# Patient Record
Sex: Female | Born: 1945 | Race: White | Hispanic: No | Marital: Married | Smoking: Former smoker
Health system: Southern US, Community
[De-identification: ages and names within clinical notes are randomized; demographics above are authoritative.]

## PROBLEM LIST (undated history)

## (undated) DIAGNOSIS — R413 Other amnesia: Secondary | ICD-10-CM

## (undated) DIAGNOSIS — T7840XA Allergy, unspecified, initial encounter: Secondary | ICD-10-CM

## (undated) DIAGNOSIS — E7800 Pure hypercholesterolemia, unspecified: Secondary | ICD-10-CM

## (undated) DIAGNOSIS — I10 Essential (primary) hypertension: Secondary | ICD-10-CM

## (undated) DIAGNOSIS — K319 Disease of stomach and duodenum, unspecified: Secondary | ICD-10-CM

## (undated) DIAGNOSIS — C801 Malignant (primary) neoplasm, unspecified: Secondary | ICD-10-CM

## (undated) DIAGNOSIS — M1990 Unspecified osteoarthritis, unspecified site: Secondary | ICD-10-CM

## (undated) DIAGNOSIS — I200 Unstable angina: Secondary | ICD-10-CM

## (undated) DIAGNOSIS — R011 Cardiac murmur, unspecified: Secondary | ICD-10-CM

## (undated) DIAGNOSIS — I2510 Atherosclerotic heart disease of native coronary artery without angina pectoris: Secondary | ICD-10-CM

## (undated) DIAGNOSIS — M8580 Other specified disorders of bone density and structure, unspecified site: Secondary | ICD-10-CM

## (undated) HISTORY — DX: Allergy, unspecified, initial encounter: T78.40XA

## (undated) HISTORY — DX: Unspecified osteoarthritis, unspecified site: M19.90

## (undated) HISTORY — PX: EYE SURGERY: SHX253

## (undated) HISTORY — PX: HAND SURGERY: SHX662

## (undated) HISTORY — DX: Atherosclerotic heart disease of native coronary artery without angina pectoris: I25.10

## (undated) HISTORY — DX: Disease of stomach and duodenum, unspecified: K31.9

## (undated) HISTORY — DX: Other amnesia: R41.3

## (undated) HISTORY — DX: Malignant (primary) neoplasm, unspecified: C80.1

## (undated) HISTORY — PX: OTHER SURGICAL HISTORY: SHX169

## (undated) HISTORY — PX: ABDOMINAL HYSTERECTOMY: SHX81

## (undated) HISTORY — PX: CORONARY ARTERY BYPASS GRAFT: SHX141

## (undated) HISTORY — DX: Other specified disorders of bone density and structure, unspecified site: M85.80

## (undated) HISTORY — DX: Cardiac murmur, unspecified: R01.1

## (undated) HISTORY — PX: CHOLECYSTECTOMY: SHX55

---

## 1998-07-05 ENCOUNTER — Encounter: Payer: Self-pay | Admitting: Cardiology

## 1998-07-05 ENCOUNTER — Inpatient Hospital Stay (HOSPITAL_COMMUNITY): Admission: EM | Admit: 1998-07-05 | Discharge: 1998-07-08 | Payer: Self-pay | Admitting: Emergency Medicine

## 1998-08-13 ENCOUNTER — Encounter: Payer: Self-pay | Admitting: Cardiovascular Disease

## 1998-08-13 ENCOUNTER — Ambulatory Visit (HOSPITAL_COMMUNITY): Admission: RE | Admit: 1998-08-13 | Discharge: 1998-08-13 | Payer: Self-pay | Admitting: Cardiovascular Disease

## 1998-10-15 DIAGNOSIS — C4491 Basal cell carcinoma of skin, unspecified: Secondary | ICD-10-CM

## 1998-10-15 HISTORY — DX: Basal cell carcinoma of skin, unspecified: C44.91

## 1998-11-26 ENCOUNTER — Other Ambulatory Visit: Admission: RE | Admit: 1998-11-26 | Discharge: 1998-11-26 | Payer: Self-pay | Admitting: Family Medicine

## 1999-07-04 ENCOUNTER — Emergency Department (HOSPITAL_COMMUNITY): Admission: EM | Admit: 1999-07-04 | Discharge: 1999-07-04 | Payer: Self-pay | Admitting: Emergency Medicine

## 2002-05-08 ENCOUNTER — Ambulatory Visit (HOSPITAL_COMMUNITY): Admission: RE | Admit: 2002-05-08 | Discharge: 2002-05-08 | Payer: Self-pay | Admitting: Cardiovascular Disease

## 2002-05-08 ENCOUNTER — Encounter: Payer: Self-pay | Admitting: Cardiovascular Disease

## 2003-10-14 ENCOUNTER — Encounter: Admission: RE | Admit: 2003-10-14 | Discharge: 2003-10-14 | Payer: Self-pay | Admitting: Surgery

## 2003-10-17 ENCOUNTER — Ambulatory Visit (HOSPITAL_BASED_OUTPATIENT_CLINIC_OR_DEPARTMENT_OTHER): Admission: RE | Admit: 2003-10-17 | Discharge: 2003-10-17 | Payer: Self-pay | Admitting: Surgery

## 2003-10-22 ENCOUNTER — Encounter (INDEPENDENT_AMBULATORY_CARE_PROVIDER_SITE_OTHER): Payer: Self-pay | Admitting: *Deleted

## 2003-10-22 ENCOUNTER — Ambulatory Visit (HOSPITAL_COMMUNITY): Admission: RE | Admit: 2003-10-22 | Discharge: 2003-10-22 | Payer: Self-pay | Admitting: Surgery

## 2003-10-22 ENCOUNTER — Ambulatory Visit (HOSPITAL_BASED_OUTPATIENT_CLINIC_OR_DEPARTMENT_OTHER): Admission: RE | Admit: 2003-10-22 | Discharge: 2003-10-22 | Payer: Self-pay | Admitting: Surgery

## 2004-02-01 ENCOUNTER — Emergency Department (HOSPITAL_COMMUNITY): Admission: EM | Admit: 2004-02-01 | Discharge: 2004-02-01 | Payer: Self-pay | Admitting: Emergency Medicine

## 2004-03-17 ENCOUNTER — Encounter (HOSPITAL_COMMUNITY): Admission: RE | Admit: 2004-03-17 | Discharge: 2004-04-16 | Payer: Self-pay | Admitting: Orthopaedic Surgery

## 2004-04-30 ENCOUNTER — Encounter (HOSPITAL_COMMUNITY): Admission: RE | Admit: 2004-04-30 | Discharge: 2004-05-30 | Payer: Self-pay | Admitting: Orthopaedic Surgery

## 2004-05-12 ENCOUNTER — Ambulatory Visit (HOSPITAL_COMMUNITY): Admission: RE | Admit: 2004-05-12 | Discharge: 2004-05-12 | Payer: Self-pay | Admitting: Orthopaedic Surgery

## 2006-03-22 ENCOUNTER — Other Ambulatory Visit: Admission: RE | Admit: 2006-03-22 | Discharge: 2006-03-22 | Payer: Self-pay | Admitting: Family Medicine

## 2008-08-15 ENCOUNTER — Ambulatory Visit (HOSPITAL_COMMUNITY): Admission: RE | Admit: 2008-08-15 | Discharge: 2008-08-15 | Payer: Self-pay | Admitting: Family Medicine

## 2008-10-18 ENCOUNTER — Encounter (INDEPENDENT_AMBULATORY_CARE_PROVIDER_SITE_OTHER): Payer: Self-pay | Admitting: General Surgery

## 2008-10-18 ENCOUNTER — Ambulatory Visit (HOSPITAL_COMMUNITY): Admission: RE | Admit: 2008-10-18 | Discharge: 2008-10-18 | Payer: Self-pay | Admitting: General Surgery

## 2008-12-21 ENCOUNTER — Inpatient Hospital Stay (HOSPITAL_COMMUNITY): Admission: EM | Admit: 2008-12-21 | Discharge: 2009-01-02 | Payer: Self-pay | Admitting: Emergency Medicine

## 2008-12-21 ENCOUNTER — Ambulatory Visit: Payer: Self-pay | Admitting: Thoracic Surgery (Cardiothoracic Vascular Surgery)

## 2008-12-23 ENCOUNTER — Encounter: Payer: Self-pay | Admitting: Thoracic Surgery (Cardiothoracic Vascular Surgery)

## 2009-01-10 ENCOUNTER — Encounter
Admission: RE | Admit: 2009-01-10 | Discharge: 2009-01-10 | Payer: Self-pay | Admitting: Thoracic Surgery (Cardiothoracic Vascular Surgery)

## 2009-01-10 ENCOUNTER — Ambulatory Visit: Payer: Self-pay | Admitting: Thoracic Surgery (Cardiothoracic Vascular Surgery)

## 2009-01-27 ENCOUNTER — Ambulatory Visit: Payer: Self-pay | Admitting: Thoracic Surgery (Cardiothoracic Vascular Surgery)

## 2009-09-03 ENCOUNTER — Inpatient Hospital Stay (HOSPITAL_COMMUNITY): Admission: EM | Admit: 2009-09-03 | Discharge: 2009-09-05 | Payer: Self-pay | Admitting: Emergency Medicine

## 2009-12-12 ENCOUNTER — Encounter (INDEPENDENT_AMBULATORY_CARE_PROVIDER_SITE_OTHER): Payer: Self-pay | Admitting: *Deleted

## 2009-12-18 ENCOUNTER — Observation Stay (HOSPITAL_COMMUNITY): Admission: EM | Admit: 2009-12-18 | Discharge: 2009-12-22 | Payer: Self-pay | Admitting: Emergency Medicine

## 2010-02-18 IMAGING — CR DG CHEST 2V
2 series · 2 of 2 positions shown · non-contrast
Comparison: 12/29/2008

CLINICAL DATA: CABG for angina

CHEST - 2 VIEW

[w chest pa]
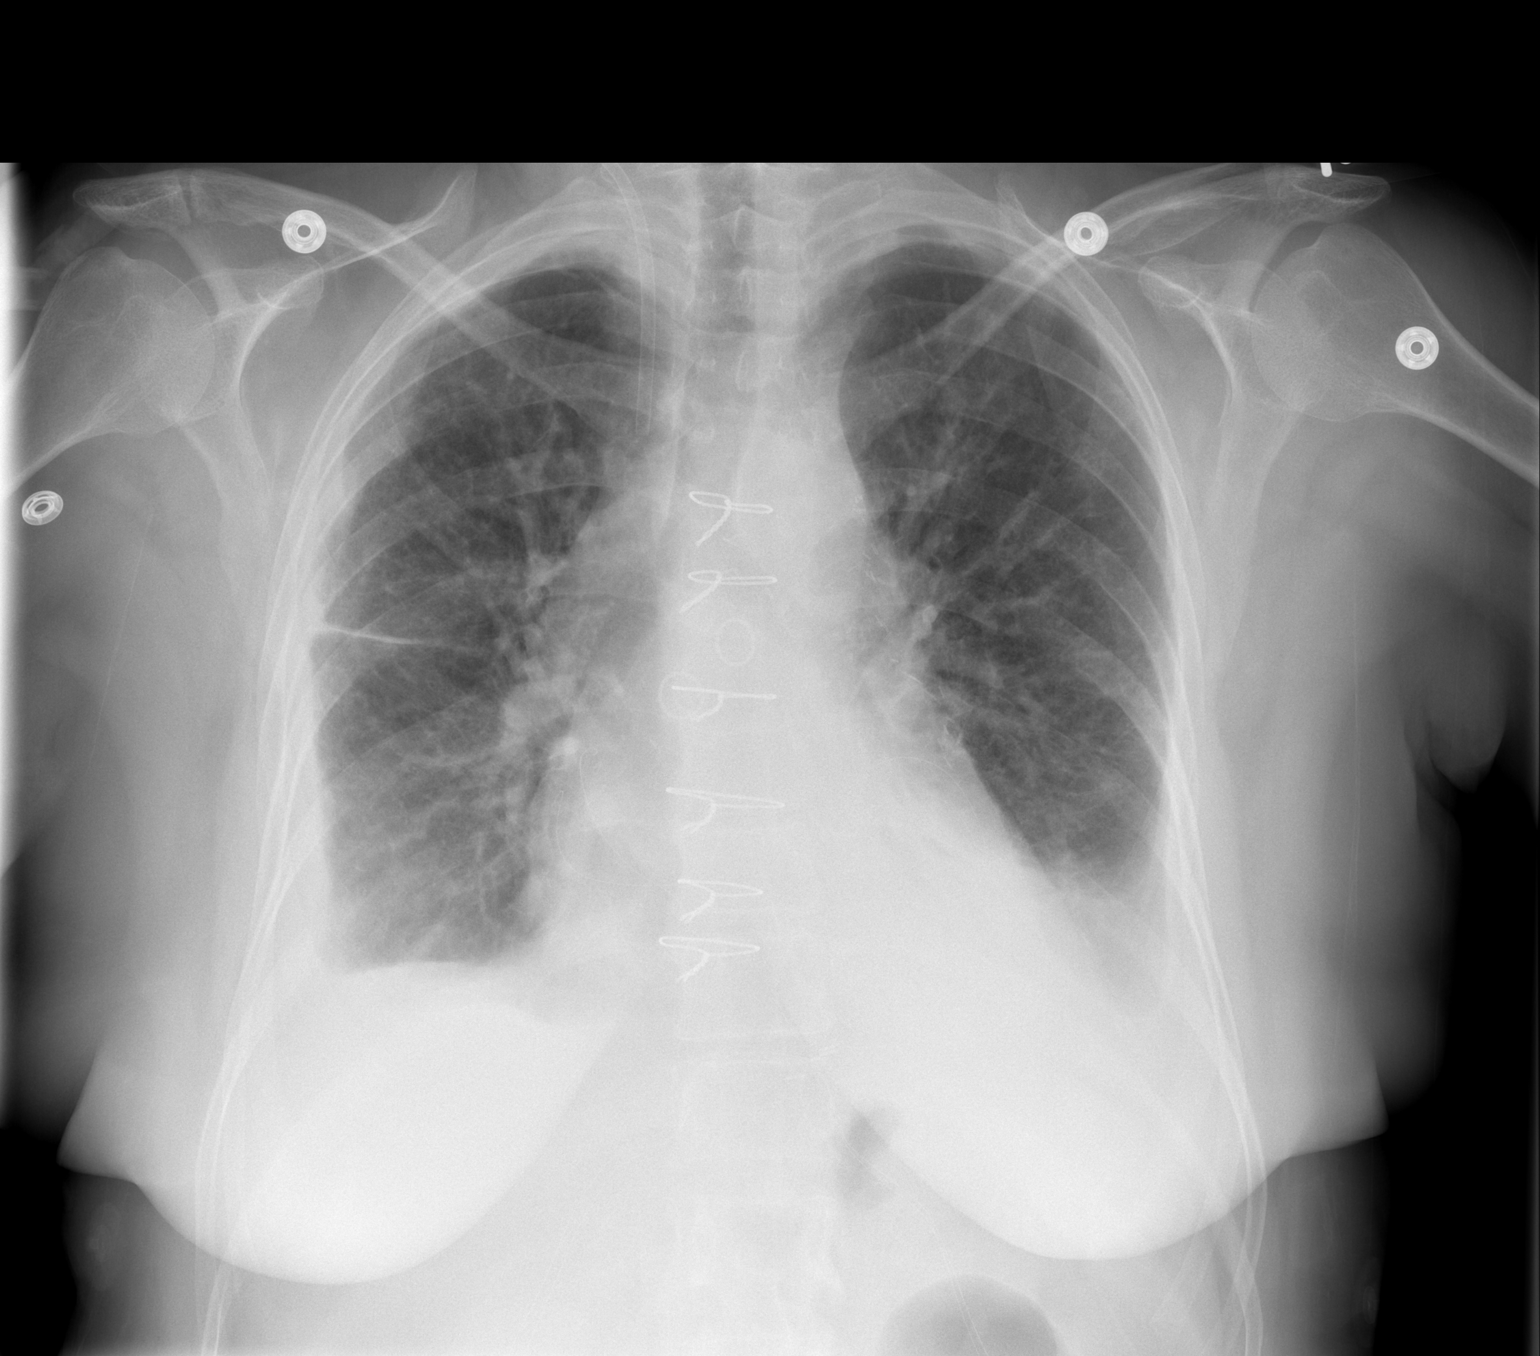

[w chest lat]
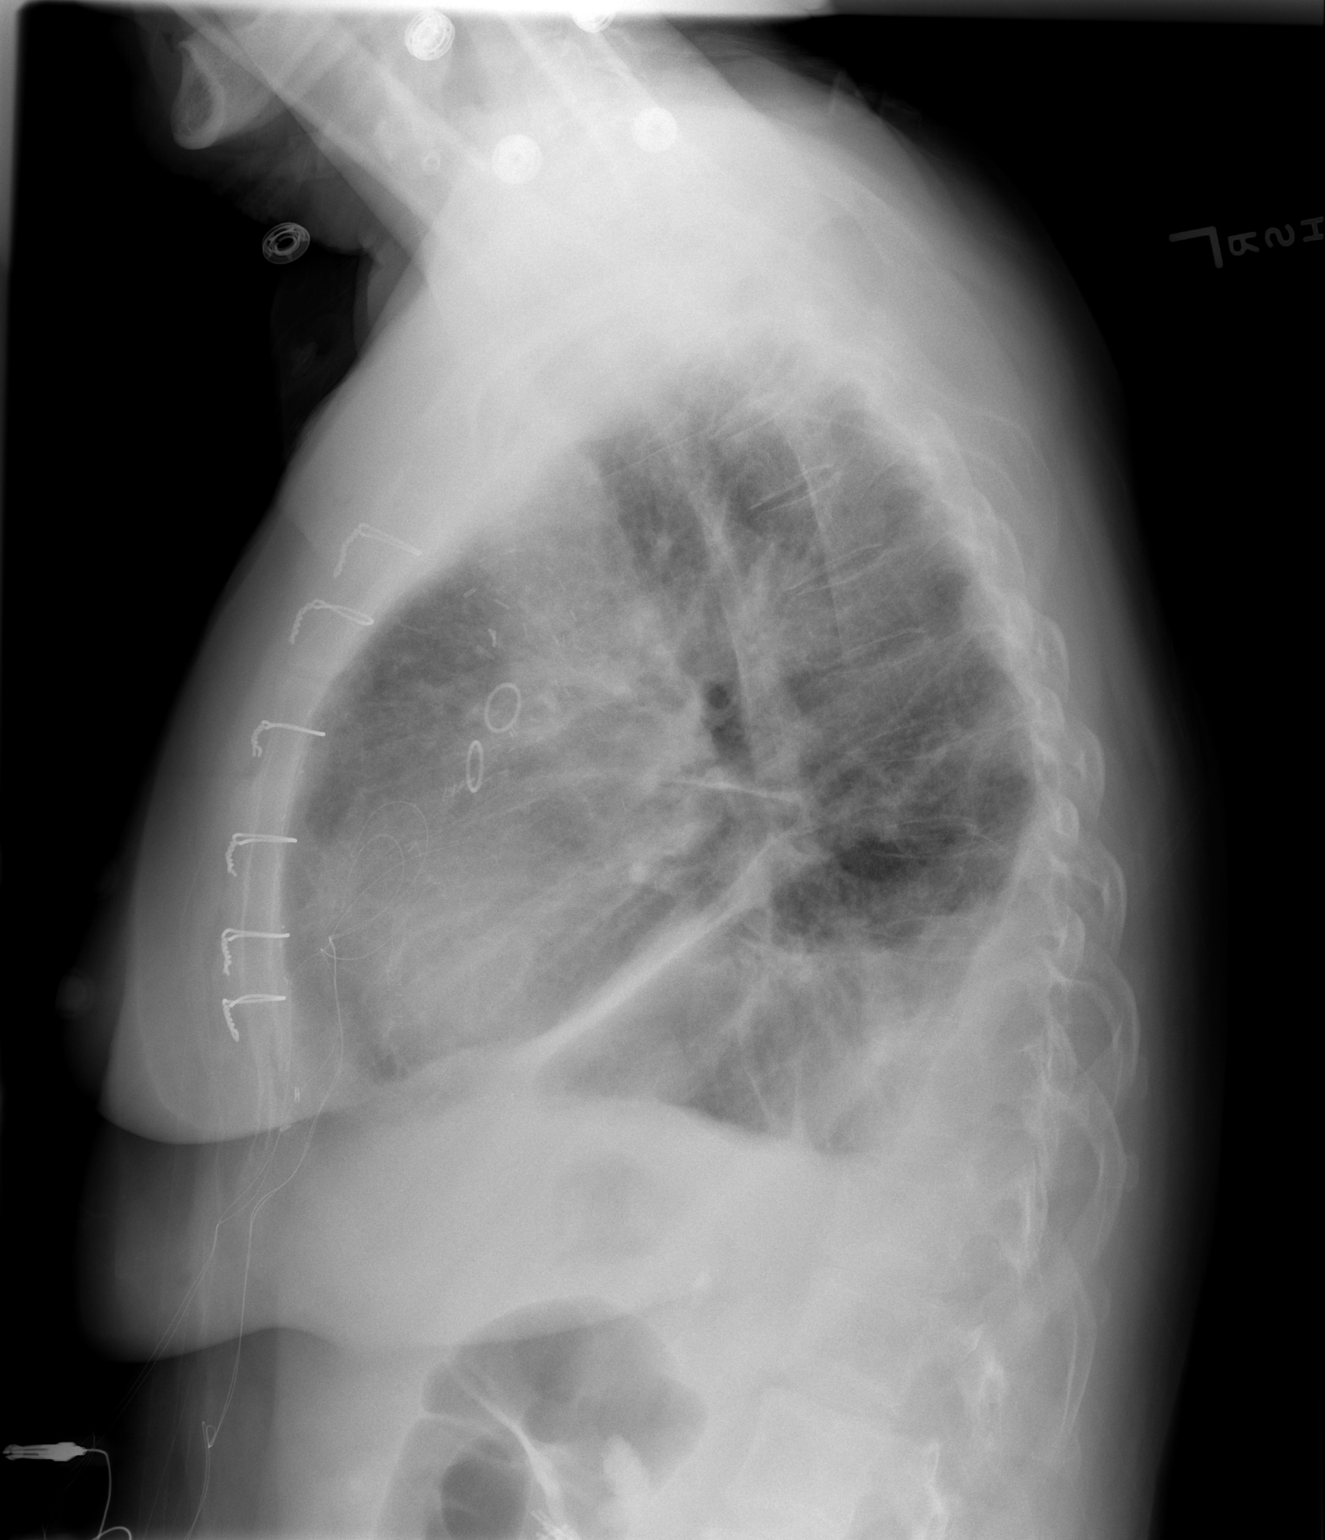

[2 of 2 positions shown; findings below may reference images not displayed]

FINDINGS: Heart mildly enlarged.  Mild vascular congestion.  There
is a left lower lobe atelectasis with small bilateral pleural
effusions.  Overall, aeration improved since yesterday's portable.
IMPRESSION: 1.  Mild vascular congestion with pleural effusions.
2.  Moderate postoperative left lower lobe atelectasis.

## 2010-05-08 ENCOUNTER — Ambulatory Visit (HOSPITAL_COMMUNITY): Admission: RE | Admit: 2010-05-08 | Discharge: 2010-05-08 | Payer: Self-pay | Admitting: Cardiovascular Disease

## 2010-05-09 ENCOUNTER — Inpatient Hospital Stay (HOSPITAL_COMMUNITY): Admission: EM | Admit: 2010-05-09 | Discharge: 2010-05-11 | Payer: Self-pay | Admitting: Emergency Medicine

## 2010-08-07 ENCOUNTER — Encounter: Payer: Self-pay | Admitting: Nurse Practitioner

## 2010-08-11 ENCOUNTER — Encounter: Payer: Self-pay | Admitting: Nurse Practitioner

## 2010-08-25 ENCOUNTER — Encounter: Payer: Self-pay | Admitting: Nurse Practitioner

## 2010-08-25 ENCOUNTER — Telehealth (INDEPENDENT_AMBULATORY_CARE_PROVIDER_SITE_OTHER): Payer: Self-pay

## 2010-08-31 ENCOUNTER — Ambulatory Visit: Payer: Self-pay | Admitting: Gastroenterology

## 2010-08-31 DIAGNOSIS — K219 Gastro-esophageal reflux disease without esophagitis: Secondary | ICD-10-CM | POA: Insufficient documentation

## 2010-08-31 DIAGNOSIS — K625 Hemorrhage of anus and rectum: Secondary | ICD-10-CM | POA: Insufficient documentation

## 2010-08-31 DIAGNOSIS — Z951 Presence of aortocoronary bypass graft: Secondary | ICD-10-CM | POA: Insufficient documentation

## 2010-08-31 DIAGNOSIS — K5900 Constipation, unspecified: Secondary | ICD-10-CM | POA: Insufficient documentation

## 2010-08-31 DIAGNOSIS — K648 Other hemorrhoids: Secondary | ICD-10-CM | POA: Insufficient documentation

## 2010-09-09 ENCOUNTER — Encounter: Payer: Self-pay | Admitting: Nurse Practitioner

## 2010-09-16 ENCOUNTER — Encounter
Admission: RE | Admit: 2010-09-16 | Discharge: 2010-09-16 | Payer: Self-pay | Source: Home / Self Care | Attending: Gastroenterology | Admitting: Gastroenterology

## 2010-11-05 NOTE — Assessment & Plan Note (Signed)
Summary: rectal bleeding/sheri   History of Present Illness Visit Type: Initial Consult Primary GI MD: Rob Bunting MD Primary Raegan Sipp: Ernestina Penna, MD Requesting Rachella Basden: Ernestina Penna, MD Chief Complaint: Lower abd pain, cramping, BRB in stool and when patient wipes after BMs, constipation, rectal pain, bloating, and weight gain. Patient tried Amitiza but made her nauseous and did not help History of Present Illness:   Patient is a 65 year old female referred here for evaluation of rectal bleeding. Patient started cardiac medications in 1994 and since then she has battled constipation. Feels need to defecate but cannot evacuate bowels and passes only a few small, hard balls of stool at a time. Patient tried Amitiza twice daily but it was ineffective. Tried Miralax for a month prior to Amitiza but caused only small, unpredicatble bowel movements. Constipation over last few years despite genouous use of laxatives. Currently taking 2 stool softeners a day and working okay for now. Two weeks ago had an episode of hematochezia associated with rectal burning. Other than this, she has intermittently seen small amounts of blood on toilet tissue.   Patient describes occasional lower abdominal cramping, not always relieved with defecation.   Labs from PCP on 08/14/10 reveal normal  CBC, CMET. Weight up 18 pounds over the last year, may be secondary to smoking cessation.    History of GERD manifested as heartburn since starting cardiac medications in 1994.  Doing wel on PPI.   Patient tells me she was scheduled to see [place] a few months ago but cancelled appt. as her cardioologist felt she was too high risk for colonoscopy.    GI Review of Systems    Reports abdominal pain, bloating, and  chest pain.     Location of  Abdominal pain: lower abdomen.    Denies acid reflux, belching, dysphagia with liquids, dysphagia with solids, heartburn, loss of appetite, nausea, vomiting, vomiting blood, weight  loss, and  weight gain.      Reports change in bowel habits, constipation, rectal bleeding, and  rectal pain.     Denies anal fissure, black tarry stools, diarrhea, diverticulosis, fecal incontinence, heme positive stool, hemorrhoids, irritable bowel syndrome, jaundice, light color stool, and  liver problems.    Current Medications (verified): 1)  Crestor 10 Mg Tabs (Rosuvastatin Calcium) .... One Tablet By Mouth Once Daily 2)  Isosorbide Mononitrate Cr 60 Mg Xr24h-Tab (Isosorbide Mononitrate) .... 1/2 Tablet By Mouth Once Daily 3)  Avapro 75 Mg Tabs (Irbesartan) .... One Tablet By Mouth Once Daily 4)  Aspirin 325 Mg  Tabs (Aspirin) .... One Tablet By Mouth Once Daily 5)  Co-Enzyme Q-10 30 Mg Caps (Coenzyme Q10) .... One Tablet By Mouth Once Daily 6)  Lovaza 1 Gm Caps (Omega-3-Acid Ethyl Esters) .... One Tablet By Mouth Once Daily 7)  Metoprolol Tartrate 25 Mg Tabs (Metoprolol Tartrate) .... 1/2 Tablet By Mouth Once Daily 8)  Nexium 40 Mg Cpdr (Esomeprazole Magnesium) .... One Tablet By Mouth Once Daily 9)  Nitrostat 0.4 Mg Subl (Nitroglycerin) .... As Needed 10)  Plavix 75 Mg Tabs (Clopidogrel Bisulfate) .... One Tablet By Mouth Once Daily 11)  Ranexa 500 Mg Xr12h-Tab (Ranolazine) .... One Tablet By Mouth Two Times A Day 12)  Pataday 0.2 % Soln (Olopatadine Hcl) .... As Directed 13)  B Complex Vitamins  Caps (B Complex Vitamins) .... One Tablet By Mouth Two Times A Day With Meals 14)  Vitamin C 500 Mg Chew (Ascorbic Acid) .... One Tablet By Mouth Three Times  A Day 15)  Vitamin D3 1000 Unit Tabs (Cholecalciferol) .... One Tablet By Mouth Once Daily 16)  Calcium Carbonate 600 Mg Tabs (Calcium Carbonate) .... One Tablet By Mouth Once Daily 17)  Claritin 10 Mg Tabs (Loratadine) .... One Tablet By Mouth Once Daily 18)  Actonel 30 Mg Tabs (Risedronate Sodium) .... Once A Month By Mouth 19)  Stool Softener 250 Mg Caps (Docusate Sodium) .... As Needed  Allergies (verified): 1)  ! Codeine  Past  History:  Past Medical History: Ovarian Cancer Anemia Arthritis Chronic Headaches Coronary Artery Disease GERD Hyperlipidemia Hypertension Obesity Hx of Pneumonia Hx of Urinary Tract Infection  Past Surgical History: Hysterectomy--Total Appendectomy Angioplasty/Stent Cholecystectomy  Family History: Does not know Dad Family History  No FH of Colon Cancer: Family History of Diabetes: Mother, MGM, and Maternal Uncles x 2  Family History of Heart Disease: MGM, MGF, and Maternal Uncles x 2, and brother  Family History of Kidney Disease: Mother, and MGM  Social History: Disabled  Married 3 Childern Patient is a former smoker.  Alcohol Use - yes: Glass of Red Wine Occ  Daily Caffeine Use: Green Tea Occ  Illicit Drug Use - no Smoking Status:  quit Drug Use:  no  Review of Systems       The patient complains of allergy/sinus, arthritis/joint pain, back pain, shortness of breath, sleeping problems, swelling of feet/legs, and thirst - excessive.  The patient denies anemia, anxiety-new, blood in urine, breast changes/lumps, change in vision, confusion, cough, coughing up blood, depression-new, fainting, fatigue, fever, headaches-new, hearing problems, heart murmur, heart rhythm changes, itching, menstrual pain, muscle pains/cramps, night sweats, nosebleeds, pregnancy symptoms, skin rash, sore throat, swollen lymph glands, thirst - excessive , urination - excessive , urination changes/pain, urine leakage, vision changes, and voice change.    Vital Signs:  Patient profile:   65 year old female Height:      63 inches Weight:      178 pounds BMI:     31.65 BSA:     1.84 Pulse rate:   60 / minute Pulse rhythm:   regular BP sitting:   124 / 62  (left arm) Cuff size:   regular  Vitals Entered By: Ok Anis CMA (August 31, 2010 9:31 AM)  Physical Exam  General:  Well developed, well nourished, no acute distress. Head:  Normocephalic and atraumatic. Eyes:  Conjunctiva  pink, no icterus.  Neck:  no obvious masses  Lungs:  Clear throughout to auscultation. Heart:  Regular rate and rhythm; no murmurs, rubs,  or bruits. Abdomen:  Abdomen soft, nontender, nondistended. No obvious masses or hepatomegaly.Normal bowel sounds.  Rectal:  No external lesions. A few small internal hemorrhoids. Light brown, heme negative stool. Msk:  Symmetrical with no gross deformities. Normal posture. Extremities:  No palmar erythema, no edema.  Neurologic:  Alert and  oriented x4;  grossly normal neurologically. Skin:  Intact without significant lesions or rashes. Cervical Nodes:  No significant cervical adenopathy. Psych:  Alert and cooperative. Normal mood and affect.   Impression & Recommendations:  Problem # 1:  RECTAL BLEEDING (ICD-569.3) Assessment Deteriorated Likely related to internal hemorrhoids or a non-visualized fissure. Of course, colon neoplasm is on list of differentials as well. Patient hasn't had a colonoscopy in at least 10 years.  While the procedure could be done on Plavix, from a cardiac standpoint the patient may be too high risk for sedation.  Patient's cardiologist will be contacted and if she is too high risk for  the procedure, we will schedule her for a virtual colonoscopy instead. Patient understands that a virtual colonoscopy, or barium enema (as a last resort), would be purely diagnostic.  Problem # 2:  CORONARY ARTERY DISEASE (ICD-414.00) Assessment: Comment Only Followed by Dr. Alanda Amass. CABG a year ago, apparently it was partially sucessful.  Patient is on chronic Plavix.   Problem # 3:  GERD (ICD-530.81) Assessment: Comment Only For the most part she is asymptomatic on PPI.   Problem # 4:  CONSTIPATION (ICD-564.00) Assessment: Deteriorated Has tried Miralax and Amitiza. Start daily Citrucel, use OTC suppository such as Dulcolax 3 times a week. May continue daily stool softeners.    Problem # 5:  HEMORRHOIDS-INTERNAL  (ICD-455.0) Assessment: Comment Only Small internal hemorrhoids, may have been source of bleeding. Trial of steroid suppositories. Will need aggressive bowel regimen.   Patient Instructions: 1)  We are sending Dr. Alanda Amass a letter regarding a Colonoscopy procedure.  We will notify you once he notifies [place].  2)  We sent prescriptions for Dulcolax Suppositories and Hydrocortisone HC Suppositories to [facility]. 3)  Please take Citrucel in water, 1 tsp to start out with then you can go to a larger spoonful.   4)  Copy sent to : Ernestina Penna, MD 5)  The medication list was reviewed and reconciled.  All changed / newly prescribed medications were explained.  A complete medication list was provided to the patient / caregiver. Prescriptions: DULCOLAX 10 MG SUPP (BISACODYL) Use 1 suppository 3 times a week  #12 x 0   Entered by:   Lowry Ram NCMA   Authorized by:   Willette Cluster NP   Signed by:   Lowry Ram NCMA on 08/31/2010   Method used:   [facility] to        [facility] [phone]* (retail)       [address]       [place], [place]  [address]       Ph: [phone] or [phone]       Fax: [phone]   RxID:   [id number] HYDROCORTISONE ACETATE 25 MG SUPP (HYDROCORTISONE ACETATE) Use 1 rectally at bedtime for 1 week  #7 x 1   Entered by:   Lowry Ram NCMA   Authorized by:   Willette Cluster NP   Signed by:   Lowry Ram NCMA on 08/31/2010   Method used:   [facility] to        [facility] [phone][facility] (retail)       [address]       [place], [place]  [address]       Ph: [phone] or [phone]       Fax: [phone]   RxID:   [id number]

## 2010-11-05 NOTE — Procedures (Signed)
Summary: Virtual Colonoscopy  Order for Virtual Colonoscopy @ [facility]  Appended Document: Virtual Colonoscopy We got authorization from [facility] for this Virtual Colonoscopy.  Auth # [id number]. Spoke to [place] at [facility] and she will schedule it and call the patient.

## 2010-11-05 NOTE — Letter (Signed)
Summary: New Patient letter  [facility]  [address] [place], [place] [address]   Phone: [phone]  Fax: [phone]       12/12/2009 MRN: [id number]  [facility] [address] [place], [place]  [address]  Dear Ms. Buscemi,  Welcome to the Gastroenterology Division at [facility].    You are scheduled to see Dr.  Marina Goodell on 01-08-10 at 9:15AM on the 3rd floor at [facility], [address].  We ask that you try to arrive at our office 15 minutes prior to your appointment time to allow for check-in.  We would like you to complete the enclosed self-administered evaluation form prior to your visit and bring it with you on the day of your appointment.  We will review it with you.  Also, please bring a complete list of all your medications or, if you prefer, bring the medication bottles and we will list them.  Please bring your insurance card so that we may make a copy of it.  If your insurance requires a referral to see a specialist, please bring your referral form from your primary care physician.  Co-payments are due at the time of your visit and may be paid by cash, check or credit card.     Your office visit will consist of a consult with your physician (includes a physical exam), any laboratory testing he/she may order, scheduling of any necessary diagnostic testing (e.g. x-ray, ultrasound, CT-scan), and scheduling of a procedure (e.g. Endoscopy, Colonoscopy) if required.  Please allow enough time on your schedule to allow for any/all of these possibilities.    If you cannot keep your appointment, please call [phone] to cancel or reschedule prior to your appointment date.  This allows [place] the opportunity to schedule an appointment for another patient in need of care.  If you do not cancel or reschedule by 5 p.m. the business day prior to your appointment date, you will be charged a $50.00 late cancellation/no-show fee.    Thank you for choosing  [facility] for your medical needs.  We appreciate the opportunity to care for you.  Please visit [place] at our website  to learn more about our practice.                     Sincerely,                                                             The Gastroenterology Division

## 2010-11-05 NOTE — Letter (Signed)
Summary: Colonoscopy letter/[facility]. [facility]  [address] [place], [place] [address]   Phone: [phone]  Fax: [phone]    08/31/2010  [facility]   Dear Dr. Susa Griffins,  Patient Kristina Gibbs, DOB [birth year] saw Willette Cluster ACNP, unde er the supervision of Dr. Rob Bunting in our office today. She was referred from Dr. Varney Baas office, [facility]. She is followed by you for a history of Coronary Artery Disease. She was seen today for evaluation of rectal bleeding and constipation.  We are recommending a Colonoscopy but are concerned about the risk of sedation in Kristina Gibbs given her cardiac history.  Please call or fax your recommendation to Dr. Rob Bunting, or Willette Cluster ACNP.  Phone [phone], Fax [phone].                                                        Thank you,                                                         Willette Cluster ACNP           Sincerely,   Lowry Ram NCMA  Appended Document: Colonoscopy letter/Dr. Alanda Amass Dr Susa Griffins called Willette Cluster ACNP and said he was not comfortable with the pt having a procedure in our facility with the concsious sedation.  He asked if we considered the Virtual Colonoscopy. Gunnar Fusi did tell him that Dr. Christella Hartigan and she both agreed that the other option for the pt would be a Virtual Colonoscopy.   I told the today what Dr. Alanda Amass discussed with Gunnar Fusi.  I told her we were faxing her information and ins info to [facility]. They will precert it and call the pt to confirm.

## 2010-11-05 NOTE — Progress Notes (Signed)
Summary: triage  Phone Note From Other Clinic Call back at [phone]   Caller: Patsy, ref coor Call For: Dr. Leone Payor  (doctor of the day) Reason for Call: Schedule Patient Appt Summary of Call: Dr. Rudi Heap would like pt triaged for rectal bleeding... no GI hx per Patsy Initial call taken by: Vallarie Mare,  August 25, 2010 9:16 AM  Follow-up for Phone Call        Left message for patient to call back Darcey Nora RN, [facility]  August 25, 2010 12:06 PM  Patient  is schedued with Willette Cluster RNP for 08/31/10 10:00 Follow-up by: Darcey Nora RN, CGRN,  August 25, 2010 1:52 PM

## 2010-11-05 NOTE — Letter (Signed)
Summary: New Patient letter  [facility]  [address] [place], [place] [address]   Phone: [phone]  Fax: [phone]       08/25/2010 MRN: [id number]  [facility] [address] [place], [place]  [address]  Dear Ms. Minich,  Welcome to the Gastroenterology Division at [facility].    You are scheduled to see Willette Cluster RNP  on 08/31/10 at 10:00 on the 3rd floor at [facility], [address].  We ask that you try to arrive at our office 15 minutes prior to your appointment time to allow for check-in.  We would like you to complete the enclosed self-administered evaluation form prior to your visit and bring it with you on the day of your appointment.  We will review it with you.  Also, please bring a complete list of all your medications or, if you prefer, bring the medication bottles and we will list them.  Please bring your insurance card so that we may make a copy of it.  If your insurance requires a referral to see a specialist, please bring your referral form from your primary care physician.  Co-payments are due at the time of your visit and may be paid by cash, check or credit card.     Your office visit will consist of a consult with your physician (includes a physical exam), any laboratory testing he/she may order, scheduling of any necessary diagnostic testing (e.g. x-ray, ultrasound, CT-scan), and scheduling of a procedure (e.g. Endoscopy, Colonoscopy) if required.  Please allow enough time on your schedule to allow for any/all of these possibilities.    If you cannot keep your appointment, please call [phone] to cancel or reschedule prior to your appointment date.  This allows [place] the opportunity to schedule an appointment for another patient in need of care.  If you do not cancel or reschedule by 5 p.m. the business day prior to your appointment date, you will be charged a $50.00 late cancellation/no-show fee.    Thank you  for choosing [facility] for your medical needs.  We appreciate the opportunity to care for you.  Please visit [place] at our website  to learn more about our practice.                     Sincerely,                                                             The Gastroenterology Division

## 2010-11-06 NOTE — Letter (Signed)
Summary: [facility]  [facility]   Imported By: Sherian Rein 09/07/2010 14:13:06  _____________________________________________________________________  External Attachment:    Type:   Image     Comment:   External Document

## 2010-12-18 LAB — CARDIAC PANEL(CRET KIN+CKTOT+MB+TROPI)
CK, MB: 0.8 ng/mL (ref 0.3–4.0)
CK, MB: 0.9 ng/mL (ref 0.3–4.0)
Relative Index: INVALID (ref 0.0–2.5)
Relative Index: INVALID (ref 0.0–2.5)
Troponin I: <0.01 ng/mL (ref 0.00–0.06)
Troponin I: <0.01 ng/mL (ref 0.00–0.06)
Troponin I: <0.01 ng/mL (ref 0.00–0.06)

## 2010-12-18 LAB — BASIC METABOLIC PANEL
BUN: 19 mg/dL (ref 6–23)
CO2: 27 mEq/L (ref 19–32)
CO2: 28 mEq/L (ref 19–32)
Calcium: 8.5 mg/dL (ref 8.4–10.5)
Chloride: 108 mEq/L (ref 96–112)
Creatinine, Ser: 1.09 mg/dL (ref 0.4–1.2)
GFR calc Af Amer: >60 mL/min (ref >60)
Glucose, Bld: 104 mg/dL — ABNORMAL HIGH (ref 70–99)
Potassium: 4.6 mEq/L (ref 3.5–5.1)
Potassium: 4.6 mEq/L (ref 3.5–5.1)

## 2010-12-18 LAB — CBC
HCT: 36.1 % (ref 36.0–46.0)
HCT: 36.8 % (ref 36.0–46.0)
HCT: 37 % (ref 36.0–46.0)
Hemoglobin: 11.4 g/dL — ABNORMAL LOW (ref 12.0–15.0)
Hemoglobin: 11.8 g/dL — ABNORMAL LOW (ref 12.0–15.0)
Hemoglobin: 12.1 g/dL (ref 12.0–15.0)
MCH: 28.8 pg (ref 26.0–34.0)
MCH: 29.2 pg (ref 26.0–34.0)
MCHC: 32.4 g/dL (ref 30.0–36.0)
MCV: 88.2 fL (ref 78.0–100.0)
MCV: 88.2 fL (ref 78.0–100.0)
MCV: 88.5 fL (ref 78.0–100.0)
MCV: 88.7 fL (ref 78.0–100.0)
Platelets: 179 10*3/uL (ref 150–400)
RBC: 3.91 MIL/uL (ref 3.87–5.11)
RDW: 13.4 % (ref 11.5–15.5)
RDW: 13.5 % (ref 11.5–15.5)
WBC: 4.5 10*3/uL (ref 4.0–10.5)
WBC: 4.6 10*3/uL (ref 4.0–10.5)
WBC: 5.2 10*3/uL (ref 4.0–10.5)

## 2010-12-18 LAB — MAGNESIUM: Magnesium: 2.1 mg/dL (ref 1.5–2.5)

## 2010-12-18 LAB — DIFFERENTIAL
Basophils Relative: 1 % (ref 0–1)
Eosinophils Absolute: 0.2 10*3/uL (ref 0.0–0.7)
Eosinophils Relative: 4 % (ref 0–5)
Lymphocytes Relative: 31 % (ref 12–46)
Lymphs Abs: 1.6 10*3/uL (ref 0.7–4.0)
Monocytes Absolute: 0.4 10*3/uL (ref 0.1–1.0)
Neutro Abs: 2.9 10*3/uL (ref 1.7–7.7)

## 2010-12-18 LAB — URINALYSIS, ROUTINE W REFLEX MICROSCOPIC
Bilirubin Urine: NEGATIVE
Glucose, UA: NEGATIVE mg/dL
Hgb urine dipstick: NEGATIVE
Ketones, ur: 15 mg/dL — AB
pH: 5 (ref 5.0–8.0)

## 2010-12-18 LAB — CK TOTAL AND CKMB (NOT AT ARMC): Total CK: 47 U/L (ref 7–177)

## 2010-12-18 LAB — PROTIME-INR
INR: 1.02 (ref 0.00–1.49)
Prothrombin Time: 13.6 seconds (ref 11.6–15.2)

## 2010-12-18 LAB — POCT CARDIAC MARKERS
CKMB, poc: <1 ng/mL — ABNORMAL LOW (ref 1.0–8.0)
Myoglobin, poc: 56.8 ng/mL (ref 12–200)

## 2010-12-18 LAB — APTT: aPTT: 36 seconds (ref 24–37)

## 2010-12-18 LAB — TROPONIN I: Troponin I: <0.01 ng/mL (ref 0.00–0.06)

## 2010-12-18 LAB — HEPARIN LEVEL (UNFRACTIONATED): Heparin Unfractionated: 0.57 IU/mL (ref 0.30–0.70)

## 2010-12-18 LAB — URINE MICROSCOPIC-ADD ON

## 2010-12-27 LAB — BASIC METABOLIC PANEL
BUN: 11 mg/dL (ref 6–23)
BUN: 18 mg/dL (ref 6–23)
CO2: 28 mEq/L (ref 19–32)
CO2: 28 mEq/L (ref 19–32)
Calcium: 8 mg/dL — ABNORMAL LOW (ref 8.4–10.5)
Calcium: 8.2 mg/dL — ABNORMAL LOW (ref 8.4–10.5)
Calcium: 9.3 mg/dL (ref 8.4–10.5)
Chloride: 102 mEq/L (ref 96–112)
Chloride: 103 mEq/L (ref 96–112)
Creatinine, Ser: 1.05 mg/dL (ref 0.4–1.2)
Creatinine, Ser: 1.06 mg/dL (ref 0.4–1.2)
GFR calc Af Amer: >60 mL/min (ref >60)
GFR calc Af Amer: >60 mL/min (ref >60)
GFR calc Af Amer: >60 mL/min (ref >60)
GFR calc non Af Amer: 53 mL/min — ABNORMAL LOW (ref >60)
Glucose, Bld: 103 mg/dL — ABNORMAL HIGH (ref 70–99)
Glucose, Bld: 108 mg/dL — ABNORMAL HIGH (ref 70–99)
Sodium: 137 mEq/L (ref 135–145)

## 2010-12-27 LAB — POCT CARDIAC MARKERS
Myoglobin, poc: 62.8 ng/mL (ref 12–200)
Troponin i, poc: <0.05 ng/mL (ref 0.00–0.09)

## 2010-12-27 LAB — CARDIAC PANEL(CRET KIN+CKTOT+MB+TROPI)
CK, MB: 0.7 ng/mL (ref 0.3–4.0)
Relative Index: INVALID (ref 0.0–2.5)
Relative Index: INVALID (ref 0.0–2.5)
Total CK: 37 U/L (ref 7–177)
Troponin I: 0.01 ng/mL (ref 0.00–0.06)

## 2010-12-27 LAB — COMPREHENSIVE METABOLIC PANEL
ALT: 27 U/L (ref 0–35)
AST: 24 U/L (ref 0–37)
CO2: 29 mEq/L (ref 19–32)
Calcium: 8.9 mg/dL (ref 8.4–10.5)
Creatinine, Ser: 1.01 mg/dL (ref 0.4–1.2)
GFR calc Af Amer: >60 mL/min (ref >60)
GFR calc non Af Amer: 55 mL/min — ABNORMAL LOW (ref >60)
Sodium: 134 mEq/L — ABNORMAL LOW (ref 135–145)
Total Protein: 6.7 g/dL (ref 6.0–8.3)

## 2010-12-27 LAB — DIFFERENTIAL
Basophils Absolute: 0 10*3/uL (ref 0.0–0.1)
Basophils Relative: 1 % (ref 0–1)
Eosinophils Absolute: 0.3 10*3/uL (ref 0.0–0.7)
Eosinophils Relative: 5 % (ref 0–5)
Lymphocytes Relative: 38 % (ref 12–46)
Lymphs Abs: 2 10*3/uL (ref 0.7–4.0)
Monocytes Absolute: 0.5 10*3/uL (ref 0.1–1.0)
Monocytes Relative: 9 % (ref 3–12)
Neutrophils Relative %: 47 % (ref 43–77)

## 2010-12-27 LAB — CBC
HCT: 32.6 % — ABNORMAL LOW (ref 36.0–46.0)
Hemoglobin: 11.2 g/dL — ABNORMAL LOW (ref 12.0–15.0)
MCHC: 34.2 g/dL (ref 30.0–36.0)
MCHC: 34.4 g/dL (ref 30.0–36.0)
MCHC: 34.6 g/dL (ref 30.0–36.0)
MCV: 89.2 fL (ref 78.0–100.0)
MCV: 90.4 fL (ref 78.0–100.0)
MCV: 90.4 fL (ref 78.0–100.0)
Platelets: 177 10*3/uL (ref 150–400)
RBC: 3.61 MIL/uL — ABNORMAL LOW (ref 3.87–5.11)
RBC: 3.71 MIL/uL — ABNORMAL LOW (ref 3.87–5.11)
RBC: 3.74 MIL/uL — ABNORMAL LOW (ref 3.87–5.11)
RBC: 4.31 MIL/uL (ref 3.87–5.11)
RDW: 13.6 % (ref 11.5–15.5)
RDW: 13.7 % (ref 11.5–15.5)
RDW: 13.8 % (ref 11.5–15.5)
WBC: 5.2 10*3/uL (ref 4.0–10.5)

## 2010-12-27 LAB — HEPARIN LEVEL (UNFRACTIONATED): Heparin Unfractionated: 0.89 IU/mL — ABNORMAL HIGH (ref 0.30–0.70)

## 2010-12-27 LAB — LIPID PANEL
LDL Cholesterol: 84 mg/dL (ref 0–99)
Total CHOL/HDL Ratio: 2.9 RATIO
Triglycerides: 76 mg/dL (ref <150)
VLDL: 15 mg/dL (ref 0–40)

## 2010-12-27 LAB — MRSA PCR SCREENING: MRSA by PCR: NEGATIVE

## 2011-01-05 LAB — CBC
Hemoglobin: 13.7 g/dL (ref 12.0–15.0)
Platelets: 194 10*3/uL (ref 150–400)
Platelets: 207 10*3/uL (ref 150–400)
RDW: 14.5 % (ref 11.5–15.5)
WBC: 4.9 10*3/uL (ref 4.0–10.5)
WBC: 5.5 10*3/uL (ref 4.0–10.5)

## 2011-01-05 LAB — BASIC METABOLIC PANEL
BUN: 14 mg/dL (ref 6–23)
BUN: 16 mg/dL (ref 6–23)
Calcium: 9.1 mg/dL (ref 8.4–10.5)
Creatinine, Ser: 0.92 mg/dL (ref 0.4–1.2)
GFR calc Af Amer: >60 mL/min (ref >60)
GFR calc non Af Amer: 51 mL/min — ABNORMAL LOW (ref >60)
GFR calc non Af Amer: >60 mL/min (ref >60)
Glucose, Bld: 122 mg/dL — ABNORMAL HIGH (ref 70–99)
Sodium: 141 mEq/L (ref 135–145)

## 2011-01-05 LAB — CARDIAC PANEL(CRET KIN+CKTOT+MB+TROPI)
CK, MB: 1 ng/mL (ref 0.3–4.0)
Relative Index: INVALID (ref 0.0–2.5)
Total CK: 44 U/L (ref 7–177)

## 2011-01-05 LAB — LIPID PANEL
Cholesterol: 128 mg/dL (ref 0–200)
LDL Cholesterol: 53 mg/dL (ref 0–99)
Total CHOL/HDL Ratio: 2.8 RATIO

## 2011-01-05 LAB — PROTIME-INR: INR: 0.94 (ref 0.00–1.49)

## 2011-01-05 LAB — APTT: aPTT: 32 seconds (ref 24–37)

## 2011-01-06 LAB — CBC
HCT: 38.4 % (ref 36.0–46.0)
Platelets: 223 10*3/uL (ref 150–400)
WBC: 6.1 10*3/uL (ref 4.0–10.5)

## 2011-01-06 LAB — COMPREHENSIVE METABOLIC PANEL
AST: 17 U/L (ref 0–37)
Albumin: 3.4 g/dL — ABNORMAL LOW (ref 3.5–5.2)
Calcium: 8.9 mg/dL (ref 8.4–10.5)
Chloride: 107 mEq/L (ref 96–112)
Creatinine, Ser: 0.84 mg/dL (ref 0.4–1.2)
GFR calc Af Amer: >60 mL/min (ref >60)
Total Protein: 6.6 g/dL (ref 6.0–8.3)

## 2011-01-06 LAB — PROTIME-INR: INR: 1.02 (ref 0.00–1.49)

## 2011-01-06 LAB — URINALYSIS, ROUTINE W REFLEX MICROSCOPIC
Nitrite: NEGATIVE
Specific Gravity, Urine: 1.011 (ref 1.005–1.030)
pH: 5.5 (ref 5.0–8.0)

## 2011-01-06 LAB — CK TOTAL AND CKMB (NOT AT ARMC): CK, MB: 1 ng/mL (ref 0.3–4.0)

## 2011-01-06 LAB — BASIC METABOLIC PANEL
BUN: 15 mg/dL (ref 6–23)
GFR calc Af Amer: >60 mL/min (ref >60)
GFR calc non Af Amer: >60 mL/min (ref >60)
Potassium: 4.6 mEq/L (ref 3.5–5.1)
Sodium: 140 mEq/L (ref 135–145)

## 2011-01-06 LAB — TROPONIN I: Troponin I: 0.01 ng/mL (ref 0.00–0.06)

## 2011-01-06 LAB — DIFFERENTIAL
Basophils Relative: 1 % (ref 0–1)
Eosinophils Absolute: 0.3 10*3/uL (ref 0.0–0.7)
Lymphs Abs: 2.1 10*3/uL (ref 0.7–4.0)
Neutrophils Relative %: 53 % (ref 43–77)

## 2011-01-06 LAB — BRAIN NATRIURETIC PEPTIDE: Pro B Natriuretic peptide (BNP): 85 pg/mL (ref 0.0–100.0)

## 2011-01-06 LAB — TSH: TSH: 1.555 u[IU]/mL (ref 0.350–4.500)

## 2011-01-14 LAB — HEPATIC FUNCTION PANEL
ALT: 50 U/L — ABNORMAL HIGH (ref 0–35)
Alkaline Phosphatase: 74 U/L (ref 39–117)
Bilirubin, Direct: 0.3 mg/dL (ref 0.0–0.3)
Indirect Bilirubin: 0.8 mg/dL (ref 0.3–0.9)
Total Bilirubin: 1.1 mg/dL (ref 0.3–1.2)

## 2011-01-14 LAB — URINALYSIS, ROUTINE W REFLEX MICROSCOPIC
Glucose, UA: NEGATIVE mg/dL
Hgb urine dipstick: NEGATIVE
Ketones, ur: NEGATIVE mg/dL
Protein, ur: NEGATIVE mg/dL
pH: 5.5 (ref 5.0–8.0)

## 2011-01-14 LAB — POCT I-STAT 4, (NA,K, GLUC, HGB,HCT)
Glucose, Bld: 104 mg/dL — ABNORMAL HIGH (ref 70–99)
HCT: 33 % — ABNORMAL LOW (ref 36.0–46.0)
Hemoglobin: 11.2 g/dL — ABNORMAL LOW (ref 12.0–15.0)
Hemoglobin: 6.8 g/dL — CL (ref 12.0–15.0)
Hemoglobin: 8.5 g/dL — ABNORMAL LOW (ref 12.0–15.0)
Hemoglobin: 8.8 g/dL — ABNORMAL LOW (ref 12.0–15.0)
Potassium: 5 mEq/L (ref 3.5–5.1)
Sodium: 136 mEq/L (ref 135–145)
Sodium: 140 mEq/L (ref 135–145)
Sodium: 146 mEq/L — ABNORMAL HIGH (ref 135–145)

## 2011-01-14 LAB — POCT I-STAT, CHEM 8
BUN: 13 mg/dL (ref 6–23)
Calcium, Ion: 1.28 mmol/L (ref 1.12–1.32)
Chloride: 103 mEq/L (ref 96–112)
Glucose, Bld: 128 mg/dL — ABNORMAL HIGH (ref 70–99)
Glucose, Bld: 146 mg/dL — ABNORMAL HIGH (ref 70–99)
HCT: 29 % — ABNORMAL LOW (ref 36.0–46.0)
Hemoglobin: 9.9 g/dL — ABNORMAL LOW (ref 12.0–15.0)
Potassium: 4.5 mEq/L (ref 3.5–5.1)
Potassium: 5.3 mEq/L — ABNORMAL HIGH (ref 3.5–5.1)
Sodium: 142 mEq/L (ref 135–145)

## 2011-01-14 LAB — POCT I-STAT 3, ART BLOOD GAS (G3+)
Acid-Base Excess: 2 mmol/L (ref 0.0–2.0)
Acid-base deficit: 2 mmol/L (ref 0.0–2.0)
Bicarbonate: 19.8 mEq/L — ABNORMAL LOW (ref 20.0–24.0)
Bicarbonate: 22.7 mEq/L (ref 20.0–24.0)
Bicarbonate: 24.4 mEq/L — ABNORMAL HIGH (ref 20.0–24.0)
Bicarbonate: 27.6 mEq/L — ABNORMAL HIGH (ref 20.0–24.0)
O2 Saturation: 100 %
O2 Saturation: 100 %
Patient temperature: 30.5
Patient temperature: 35.7
TCO2: 24 mmol/L (ref 0–100)
TCO2: 29 mmol/L (ref 0–100)
pH, Arterial: 7.366 (ref 7.350–7.400)
pH, Arterial: 7.382 (ref 7.350–7.400)
pO2, Arterial: 149 mmHg — ABNORMAL HIGH (ref 80.0–100.0)
pO2, Arterial: 232 mmHg — ABNORMAL HIGH (ref 80.0–100.0)
pO2, Arterial: 338 mmHg — ABNORMAL HIGH (ref 80.0–100.0)

## 2011-01-14 LAB — CBC
HCT: 25.5 % — ABNORMAL LOW (ref 36.0–46.0)
HCT: 26.7 % — ABNORMAL LOW (ref 36.0–46.0)
HCT: 29.7 % — ABNORMAL LOW (ref 36.0–46.0)
HCT: 31.9 % — ABNORMAL LOW (ref 36.0–46.0)
HCT: 37.7 % (ref 36.0–46.0)
HCT: 39.3 % (ref 36.0–46.0)
HCT: 40 % (ref 36.0–46.0)
HCT: 42.7 % (ref 36.0–46.0)
Hemoglobin: 10.6 g/dL — ABNORMAL LOW (ref 12.0–15.0)
Hemoglobin: 12.7 g/dL (ref 12.0–15.0)
Hemoglobin: 8.4 g/dL — ABNORMAL LOW (ref 12.0–15.0)
Hemoglobin: 9.5 g/dL — ABNORMAL LOW (ref 12.0–15.0)
MCHC: 34 g/dL (ref 30.0–36.0)
MCHC: 34 g/dL (ref 30.0–36.0)
MCHC: 34.5 g/dL (ref 30.0–36.0)
MCHC: 34.8 g/dL (ref 30.0–36.0)
MCHC: 35.4 g/dL (ref 30.0–36.0)
MCV: 86.2 fL (ref 78.0–100.0)
MCV: 86.3 fL (ref 78.0–100.0)
MCV: 87.1 fL (ref 78.0–100.0)
MCV: 87.7 fL (ref 78.0–100.0)
MCV: 87.8 fL (ref 78.0–100.0)
MCV: 88 fL (ref 78.0–100.0)
MCV: 88.1 fL (ref 78.0–100.0)
Platelets: 117 10*3/uL — ABNORMAL LOW (ref 150–400)
Platelets: 127 10*3/uL — ABNORMAL LOW (ref 150–400)
Platelets: 135 10*3/uL — ABNORMAL LOW (ref 150–400)
Platelets: 183 10*3/uL (ref 150–400)
Platelets: 221 10*3/uL (ref 150–400)
RBC: 2.75 MIL/uL — ABNORMAL LOW (ref 3.87–5.11)
RBC: 3.11 MIL/uL — ABNORMAL LOW (ref 3.87–5.11)
RBC: 4.02 MIL/uL (ref 3.87–5.11)
RBC: 4.58 MIL/uL (ref 3.87–5.11)
RBC: 4.85 MIL/uL (ref 3.87–5.11)
RDW: 14.3 % (ref 11.5–15.5)
RDW: 14.3 % (ref 11.5–15.5)
RDW: 14.6 % (ref 11.5–15.5)
WBC: 4.7 10*3/uL (ref 4.0–10.5)
WBC: 5.2 10*3/uL (ref 4.0–10.5)
WBC: 5.6 10*3/uL (ref 4.0–10.5)
WBC: 6.4 10*3/uL (ref 4.0–10.5)
WBC: 7.6 10*3/uL (ref 4.0–10.5)
WBC: 7.8 10*3/uL (ref 4.0–10.5)
WBC: 8 10*3/uL (ref 4.0–10.5)

## 2011-01-14 LAB — BLOOD GAS, ARTERIAL
Bicarbonate: 24.2 mEq/L — ABNORMAL HIGH (ref 20.0–24.0)
Drawn by: 31057
FIO2: 0.21 %
O2 Saturation: 97.6 %
Patient temperature: 98.6
pH, Arterial: 7.42 — ABNORMAL HIGH (ref 7.350–7.400)

## 2011-01-14 LAB — BASIC METABOLIC PANEL
BUN: 14 mg/dL (ref 6–23)
BUN: 15 mg/dL (ref 6–23)
CO2: 27 mEq/L (ref 19–32)
CO2: 27 mEq/L (ref 19–32)
Calcium: 8.4 mg/dL (ref 8.4–10.5)
Chloride: 106 mEq/L (ref 96–112)
Chloride: 106 mEq/L (ref 96–112)
Chloride: 107 mEq/L (ref 96–112)
Chloride: 108 mEq/L (ref 96–112)
Chloride: 109 mEq/L (ref 96–112)
Creatinine, Ser: 0.8 mg/dL (ref 0.4–1.2)
GFR calc Af Amer: >60 mL/min (ref >60)
GFR calc Af Amer: >60 mL/min (ref >60)
GFR calc non Af Amer: >60 mL/min (ref >60)
GFR calc non Af Amer: >60 mL/min (ref >60)
Glucose, Bld: 141 mg/dL — ABNORMAL HIGH (ref 70–99)
Glucose, Bld: 145 mg/dL — ABNORMAL HIGH (ref 70–99)
Glucose, Bld: 181 mg/dL — ABNORMAL HIGH (ref 70–99)
Potassium: 3.8 mEq/L (ref 3.5–5.1)
Potassium: 4.1 mEq/L (ref 3.5–5.1)
Potassium: 4.2 mEq/L (ref 3.5–5.1)
Potassium: 4.2 mEq/L (ref 3.5–5.1)
Potassium: 4.4 mEq/L (ref 3.5–5.1)
Sodium: 137 mEq/L (ref 135–145)
Sodium: 140 mEq/L (ref 135–145)
Sodium: 143 mEq/L (ref 135–145)

## 2011-01-14 LAB — GLUCOSE, CAPILLARY
Glucose-Capillary: 102 mg/dL — ABNORMAL HIGH (ref 70–99)
Glucose-Capillary: 102 mg/dL — ABNORMAL HIGH (ref 70–99)
Glucose-Capillary: 103 mg/dL — ABNORMAL HIGH (ref 70–99)
Glucose-Capillary: 106 mg/dL — ABNORMAL HIGH (ref 70–99)
Glucose-Capillary: 113 mg/dL — ABNORMAL HIGH (ref 70–99)
Glucose-Capillary: 122 mg/dL — ABNORMAL HIGH (ref 70–99)
Glucose-Capillary: 139 mg/dL — ABNORMAL HIGH (ref 70–99)
Glucose-Capillary: 141 mg/dL — ABNORMAL HIGH (ref 70–99)
Glucose-Capillary: 149 mg/dL — ABNORMAL HIGH (ref 70–99)
Glucose-Capillary: 82 mg/dL (ref 70–99)

## 2011-01-14 LAB — TYPE AND SCREEN
ABO/RH(D): A NEG
Antibody Screen: NEGATIVE

## 2011-01-14 LAB — COMPREHENSIVE METABOLIC PANEL
Albumin: 3.4 g/dL — ABNORMAL LOW (ref 3.5–5.2)
BUN: 16 mg/dL (ref 6–23)
Calcium: 9.3 mg/dL (ref 8.4–10.5)
Creatinine, Ser: 1.01 mg/dL (ref 0.4–1.2)
Glucose, Bld: 103 mg/dL — ABNORMAL HIGH (ref 70–99)
Potassium: 4.6 mEq/L (ref 3.5–5.1)
Total Protein: 6.4 g/dL (ref 6.0–8.3)

## 2011-01-14 LAB — TROPONIN I
Troponin I: <0.01 ng/mL (ref 0.00–0.06)
Troponin I: <0.01 ng/mL (ref 0.00–0.06)
Troponin I: <0.01 ng/mL (ref 0.00–0.06)

## 2011-01-14 LAB — APTT: aPTT: 36 seconds (ref 24–37)

## 2011-01-14 LAB — DIFFERENTIAL
Eosinophils Absolute: 0.3 10*3/uL (ref 0.0–0.7)
Eosinophils Relative: 6 % — ABNORMAL HIGH (ref 0–5)
Lymphocytes Relative: 35 % (ref 12–46)
Lymphs Abs: 1.9 10*3/uL (ref 0.7–4.0)
Monocytes Absolute: 0.5 10*3/uL (ref 0.1–1.0)
Monocytes Relative: 9 % (ref 3–12)

## 2011-01-14 LAB — POCT I-STAT 3, VENOUS BLOOD GAS (G3P V)
Bicarbonate: 21 mEq/L (ref 20.0–24.0)
O2 Saturation: 88 %
TCO2: 22 mmol/L (ref 0–100)
pCO2, Ven: 28.8 mmHg — ABNORMAL LOW (ref 45.0–50.0)
pH, Ven: 7.441 — ABNORMAL HIGH (ref 7.250–7.300)

## 2011-01-14 LAB — CK TOTAL AND CKMB (NOT AT ARMC)
CK, MB: 0.7 ng/mL (ref 0.3–4.0)
Relative Index: INVALID (ref 0.0–2.5)
Total CK: 47 U/L (ref 7–177)

## 2011-01-14 LAB — PLATELET COUNT
Platelets: 152 10*3/uL (ref 150–400)
Platelets: 204 10*3/uL (ref 150–400)

## 2011-01-14 LAB — HEMOGLOBIN AND HEMATOCRIT, BLOOD
HCT: 27.1 % — ABNORMAL LOW (ref 36.0–46.0)
Hemoglobin: 9.3 g/dL — ABNORMAL LOW (ref 12.0–15.0)

## 2011-01-14 LAB — PROTIME-INR: Prothrombin Time: 16.6 seconds — ABNORMAL HIGH (ref 11.6–15.2)

## 2011-01-14 LAB — LIPID PANEL
LDL Cholesterol: 83 mg/dL (ref 0–99)
Triglycerides: 90 mg/dL (ref <150)
VLDL: 18 mg/dL (ref 0–40)

## 2011-01-14 LAB — MAGNESIUM: Magnesium: 3.1 mg/dL — ABNORMAL HIGH (ref 1.5–2.5)

## 2011-01-14 LAB — TSH: TSH: 1.577 u[IU]/mL (ref 0.350–4.500)

## 2011-01-14 LAB — POCT CARDIAC MARKERS: Troponin i, poc: <0.05 ng/mL (ref 0.00–0.09)

## 2011-01-14 LAB — CREATININE, SERUM: GFR calc non Af Amer: >60 mL/min (ref >60)

## 2011-01-18 LAB — PROTIME-INR
INR: 1 (ref 0.00–1.49)
Prothrombin Time: 13.1 seconds (ref 11.6–15.2)

## 2011-01-18 LAB — CBC
MCV: 87.7 fL (ref 78.0–100.0)
Platelets: 287 10*3/uL (ref 150–400)
RBC: 4.97 MIL/uL (ref 3.87–5.11)
WBC: 6.9 10*3/uL (ref 4.0–10.5)

## 2011-01-18 LAB — DIFFERENTIAL
Basophils Relative: 1 % (ref 0–1)
Eosinophils Absolute: 0.4 10*3/uL (ref 0.0–0.7)
Lymphs Abs: 2.3 10*3/uL (ref 0.7–4.0)
Neutro Abs: 3.8 10*3/uL (ref 1.7–7.7)
Neutrophils Relative %: 54 % (ref 43–77)

## 2011-01-18 LAB — BASIC METABOLIC PANEL
BUN: 14 mg/dL (ref 6–23)
Calcium: 9.4 mg/dL (ref 8.4–10.5)
Chloride: 108 mEq/L (ref 96–112)
Creatinine, Ser: 0.8 mg/dL (ref 0.4–1.2)
GFR calc Af Amer: >60 mL/min (ref >60)

## 2011-01-18 LAB — APTT: aPTT: 29 seconds (ref 24–37)

## 2011-02-16 NOTE — Op Note (Signed)
NAME:  Kristina Gibbs, Kristina Gibbs NO.:  [id number]   MEDICAL RECORD NO.:  [id number]          PATIENT TYPE:  INP   LOCATION:  2308                         FACILITY:  [facility]   PHYSICIAN:  Salvatore Decent. 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   PROCEDURES:  Median sternotomy, extracorporeal circulation, coronary  artery bypass grafting x3 (left internal mammary artery to LAD,  saphenous vein graft to obtuse marginal 1, saphenous vein graft to  posterior descending), and endoscopic vein harvest to left leg.   SURGEON:  Salvatore Decent. Dorris Fetch, MD   ASSISTANT:  Doree Fudge, PA   ANESTHESIA:  General.   FINDINGS:  Saphenous vein in right leg, very small, not harvested;  saphenous vein from left leg, small, fair quality, but usable; mammary  good-quality targets.   CLINICAL NOTE:  Kristina Gibbs is a 65 year old woman with a history of  coronary disease.  She has had previous stenting of her right coronary.  She presented with unstable anginal symptoms.  At catheterization, she  was found to have a 60% left main stenosis and a long segment 30% in-  stent restenosis in the right coronary.  She was advised to undergo  coronary artery bypass grafting.  The indications, risks, benefits, and  alternatives were discussed in detail with the patient.  She understood  and accepted the risks and agreed to proceed.  We did discuss the  feasibility of grafting the posterior descending as there was a long in-  stent restenosis as well as plaquing and some potential narrowing at the  ostium of the posterior descending.  None of these were critical  stenoses, but felt it was safest to bypass that at the time  of surgery.  The patient agreed with that plan.   OPERATIVE NOTE:  Kristina Gibbs was brought to the preop holding area on  December 27, 2008.  There, the Anesthesia Service under the direct vision  of Dr. Claybon Jabs placed a Swan-Ganz catheter and arterial blood  pressure monitoring catheter.  Intravenous antibiotics were  administered.  She was taken to the operating room, anesthetized, and  intubated.  A Foley catheter was placed.  The chest, abdomen, and legs  were prepped and draped in usual fashion.  An incision was made in the  medial aspect of the right leg just below the knee.  The greater  saphenous vein was identified, but was very small.  Therefore an  incision was made in the medial aspect of the left leg just below the  knee, saphenous vein there was slightly larger, although still  relatively small in caliber.  The right saphenous vein was not  harvested.  The left was harvested endoscopically.  It was a thin-walled  small caliber fair-quality vein that was usable.   Simultaneously, a median sternotomy was performed and the left internal  mammary artery was harvested using  standard technique.  The left  internal mammary artery was a good-quality vessel.  The patient was  heparinized.  The pericardium was opened.  The ascending aorta was  cannulated via concentric 2-0 pledgeted pursestring sutures.  A dual  stage venous cannula was placed via pursestring suture in the right  atrial appendage.  Cardiopulmonary bypass was instituted.  The patient  was cooled to 32 degrees Celsius.  Flows were maintained per protocol  throughout the bypass run.  The coronary arteries were inspected and  anastomotic sites were chosen.  The conduits were inspected and cut to  length.  Foam pad was placed in the pericardium to protect the left  phrenic nerve.  A temperature probe was placed in myocardial septum and  a cardioplegic cannula was placed in the ascending aorta.   The aorta was  crossclamped.  The left ventricle was emptied via the  aortic root vent.  Cardiac arrest then was achieved with a combination  of cold antegrade blood cardioplegia and topical iced saline, 800 mL of  cardioplegia was administered.  The myocardial septal temperature fell  to 9 degrees Celsius.  The following distal anastomoses were performed.   First, a reverse saphenous vein graft was placed end-to-side to the  posterior descending.  This was a 1.5-mm good-quality target.  There was  plaquing in the distal right and ostium of the posterior descending.  The anastomosis was performed with a running 7-0 Prolene suture.  All  anastomoses were probed proximally and distally at their completion to  ensure patency.  Cardioplegia was administered.  There was good flow and  good hemostasis.   Next, a reverse saphenous vein graft was placed end-to-side to the first  obtuse marginal branch of the left circumflex.  This was a 1.5-mm good-  quality target.  The vein was anastomosed end-to-side with a running 7-0  Prolene suture.  Again, there was good flow through the graft and the  probe passed easily.   Next, the left internal mammary artery was brought through a window in  the pericardium.  The distal end was beveled and was anastomosed end-to-  side to the mid-LAD before the takeoff of the second diagonal branch.  This was a 1.5-mm vessel of good quality.  The mammary was good quality.  The anastomosis was performed with a running 8-0 Prolene suture.  At the  completion of the mammary to LAD anastomosis, the bulldog clamp was  briefly removed to inspect for hemostasis.  Immediate rapid septal  rewarming was noted.  The bulldog clamp was replaced.  The mammary  pedicle was tacked to the epicardial surface of the heart with 6-0  Prolene sutures.  Additional cardioplegia was administered.   The vein grafts were cut to length.  The cardioplegic cannula was  removed from the ascending aorta.  The  proximal vein graft anastomoses  were performed to 4.5-mm punch aortotomies with running 6-0 Prolene  sutures.  At the completion of all proximal anastomosis, the patient was  placed in Trendelenburg position.  Lidocaine was administered.  The  bulldog clamps were again removed from the left mammary artery.  The  aortic root was de-aired and the aortic crossclamp was removed.  Total  crossclamp time was 65 minutes.   While the patient was being rewarmed, all proximal and distal  anastomoses were inspected for hemostasis.  Epicardial pacing wires were  placed on the right ventricle and right atrium.  DDD pacing was  initiated.  When the patient  rewarmed to a core temperature of 37  degrees Celsius, she was weaned from cardiopulmonary bypass on the first  attempt.  The total bypass time was 112 minutes.  The initial cardiac  index was 2 liters per minute per sq m.  This did subsequently decrease  after closure of the chest and a low-dose dopamine infusion was  initiated.  A test dose of protamine was administered and was well  tolerated.  The atrial aortic cannulae were removed.  The remainder of  protamine was administered without incident.  The chest was irrigated  with 1 liter of warm normal saline.  Hemostasis was achieved.  The  pericardium was reapproximated with interrupted 3-0 silk sutures.  The  left pleural and mediastinal chest tubes were placed through separate  subcostal incisions.  The sternum was closed with  heavy gauge interrupted stainless steel wires.  The pectoralis fascia,  subcutaneous tissue, and skin were closed in standard fashion.  All  sponge, needle, and instrument counts were correct at the end of the  procedure.  The patient was taken from the operating room to the  surgical intensive care unit in fair condition.      Salvatore Decent Dorris Fetch, M.D.  Electronically Signed     SCH/MEDQ  D:  12/27/2008  T:  12/28/2008  Job:  [id number]   cc:   Nanetta Batty,  M.D.  [facility]

## 2011-02-16 NOTE — Assessment & Plan Note (Signed)
OFFICE VISIT   Kristina Gibbs, Kristina Gibbs  DOB:  1946-07-08                                        January 10, 2009  CHART #:  [id number]   The patient is a 65 year old woman, who had coronary artery bypass  grafting on December 27, 2008.  Postoperatively, she developed a phlebitis  in her left hand.  She was treated with warm compresses initially, but  it continued to be red and swollen.  She was treated with antibiotics,  with Keflex for a week starting last Friday.  She now returns for follow  up of the hand.  She states that in the interim she has been doing well  overall.  She is not having much pain in her sternum.  The redness and  swelling has improved in the left hand.  She is able to use it normally  without any complaints.  She had noticed that her blood pressure was  high when her husband checked it at home.   PHYSICAL EXAMINATION:  GENERAL:  The patient is a 65 year old white female white  female in no acute distress.  VITAL SIGNS:  Her blood pressure is 159/74, pulse 79, respirations are  18, and her oxygen saturation is 94% on room air.  CHEST:  Her sternal incision is clean, dry, and intact.  CARDIAC:  Regular rate and rhythm.  Normal S1 and S2.  No rubs or  murmurs.  LUNGS:  Clear with equal breath sounds bilaterally.  EXTREMITIES:  She has no peripheral edema.  In her left hand, there is a  cord with slight discoloration, but no erythema.  It is still mildly  tender.   IMPRESSION:  Resolving thrombophlebitis of the left hand due to an  intravenous site.  This has been treated with antibiotics and has  improved dramatically.  I do not think she needs any more antibiotics,  but I did advise her to keep an eye on that and if it were to start  getting more red and more tender, and more swelling, to let us know  right away.  Regarding her blood pressure, it turns out when she brought  her medication in, she has not been taking her beta-blocker.  She was  discharged  on Lopressor 12.5 mg b.i.d.  She had previously been on  metoprolol 50 mg daily.  I think at this point, she is safe to go back  on the 50 mg daily of metoprolol and I will plan to see  her back at her scheduled appointment next week.  She has an appointment  with her cardiologist on Monday.   Salvatore Decent Dorris Fetch, M.D.  Electronically Signed   SCH/MEDQ  D:  01/10/2009  T:  01/11/2009  Job:  [id number]   cc:   Gerlene Burdock A. Alanda Amass, M.D.  [facility]

## 2011-02-16 NOTE — Op Note (Signed)
NAME:  Kristina Gibbs, Kristina Gibbs NO.:  [id number]   MEDICAL RECORD NO.:  [id number]          PATIENT TYPE:  AMB   LOCATION:  SDS                          FACILITY:  [facility]   PHYSICIAN:  Kristina Gibbs, M.D.DATE OF BIRTH:  1945-10-06   DATE OF PROCEDURE:  10/18/2008  DATE OF DISCHARGE:  10/18/2008                               OPERATIVE REPORT   PREOPERATIVE DIAGNOSES:  Gallbladder adenomyomatosis.   POSTOPERATIVE DIAGNOSES:  Gallbladder adenomyomatosis.   PROCEDURE:  Laparoscopic cholecystectomy and intraoperative  cholangiogram.   SURGEON:  Kristina Dare. Janee Morn, MD.   ASSISTANT:  Juanetta Gosling, MD.   ANESTHESIA:  General.   HISTORY OF PRESENT ILLNESS:  Kristina Gibbs is a 65 year old female who I  evaluated in the office for right upper quadrant abdominal pain.  Abdominal ultrasound revealed gallbladder adenomyomatosis.  This likely  represents some chronic cholecystitis.  She presents today for elective  cholecystectomy.   PROCEDURE IN DETAIL:  Informed consent was obtained.  The patient has  received cardiac clearance from Dr. Alanda Gibbs and held her Plavix for 5  days.  She received intravenous antibiotics and she was identified in  the preop holding area.  She was brought to the operating room.  General  endotracheal anesthesia was administered by the anesthesia staff.  A  time-out procedure was done.  Abdomen was prepped and draped in a  sterile fashion.  Due to her previous lower midline incision, a  supraumbilical incision was selected.  This area was infiltrated with  0.50% Marcaine with epinephrine.  Supraumbilical incision was made,  subcutaneous tissues were dissected down revealing the anterior fascia,  this was divided sharply.  No cavity was entered under direct vision  without difficulty.  A 0-Vicryl pursestring suture was placed around the  fascial opening.  The Hasson trocar was inserted.  The abdomen was then  insufflated with carbon dioxide in  a sterile fashion under direct  vision.  A 11-mm epigastric and two 5-mm lateral ports were placed.  A  0.50% Marcaine with epinephrine was used at all port sites.  The dome of  the gallbladder was retracted superomedially.  There were some filmy  adhesions on the body.  The gallbladder bed was swept away carefully  revealing the infundibulum.  This was retracted inferolaterally.  Dissection began laterally and progressed medially easily identifying  the cystic duct.  The primary dissection continued until a large window  was created between the infundibulum of the gallbladder, the liver, and  the cystic duct.  Once we had excellent visualization a clip was placed  on the infundibulocystic duct junction.  A small nick was made in the  cystic duct and a Reddick cholangiogram catheter was inserted.  Intraoperative cholangiogram was obtained that demonstrated no common  bile duct filling defects.  The cholangiogram catheter was removed and 3  clips were placed proximally on the cystic duct and it was divided.  Further dissection revealed anterior and posterior branches of the  cystic artery, which were clipped twice proximally and divided distally.  The gallbladder was taken off the liver bed with Bovie cautery and  placed in  an EndoCatch bag.  It was removed from the abdomen via the  supraumbilical port site.  The liver bed was copiously irrigated and  meticulous hemostasis was obtained with cautery.  The irrigation fluid  returned clear.  Clips were inspected remaining in good position.  The  remainder of the irrigation fluid was evacuated and the liver bed was  rechecked and it remained completely dry.  The ports were then removed  under direct vision.  The pneumoperitoneum was released.  The Hasson  trocar was removed.  Supraumbilical fascia was closed by tying the 0-  Vicryl pursestring suture with care not to trap any intraabdominal  contents.  All 4 wounds were copiously irrigated and  the skin of each  were closed with a running 4-0 Vicryl subcuticular stitch followed by  Dermabond.  The sponge, needle, and instrument counts were correct.  The  patient tolerated the procedure well without apparent complications.  The patient was taken to recovery room in stable condition.      Kristina Dare Janee Gibbs, M.D.  Electronically Signed     BET/MEDQ  D:  10/18/2008  T:  10/19/2008  Job:  [id number]   cc:   Kristina Gibbs, M.D.  [facility]  Kristina Gibbs, M.D.

## 2011-02-16 NOTE — Assessment & Plan Note (Signed)
OFFICE VISIT   Kristina Gibbs, Kristina Gibbs  DOB:  26-Nov-1945                                        January 27, 2009  CHART #:  [id number]   REASON FOR VISIT:  Followup after open-heart surgery.   HISTORY OF PRESENT ILLNESS:  The patient is a 65 year old woman who  presented with unstable angina.  She had a left main and three-vessel  disease and underwent coronary bypass grafting x3 on March 26.  Postoperatively, she did well.  She came back on April 9.  She had had a  phlebitis from an IV in her left hand.  She was doing better at that  point.  Her chest x-ray at that point showed some small bilateral  effusions.  Her thrombophlebitis had resolved.   Today, the patient returns for a scheduled followup visit.  She has been  doing well.  She does not have any anginal-type pains.  She does not  have any shortness of breath and is doing fine.  She does have some  numbness along the left aspect of her sternum and also occasionally gets  some pins and needles and some irritation with her shirt rubbing on her  chest in that area.   PHYSICAL EXAMINATION:  GENERAL:  The patient is a 65 year old white  female in no acute distress.  VITAL SIGNS: Her blood pressure is 130/60, pulse 53, respirations were  18.  Her oxygen saturation is 96% on room air.  LUNGS:  Clear with equal breath sounds bilaterally.  CARDIAC:  Regular rate and rhythm.  Normal S1 and S2.  No rubs, murmurs,  or gallops.  LUNGS:  Equal breath sounds bilaterally.  There is no rales or wheezing.  There is no peripheral edema.  Her sternum is stable.  Her incisions are  well healed.  She does have a minimal eschar at one of the chest tube  site.   CURRENT MEDICATIONS:  Plavix, Nexium, Crestor, Lovaza, vitamin D,  Coenzyme Q10, vitamin B, Fosamax, enteric-coated aspirin, Lopressor,  amiodarone, and Gliban.   Chest x-ray from 04/09 shows minimal bilateral effusions.   IMPRESSION:  The patient is doing  well at this point in time.  She was  cautioned not to lift any weight greater than 10 pounds for another 2  weeks and thereafter she can gradually build up on that.  She may begin  driving.  Appropriate precautions were discussed.  She is now a month  out from surgery.  She has been maintaining sinus rhythm.  I do not  think she needed to be on the amiodarone any longer.  She will continue  to be followed by Dr. Susa Griffins.  I would be happy to see her  back anytime if I could be of any further assistance with her care.   Salvatore Decent Dorris Fetch, M.D.  Electronically Signed   SCH/MEDQ  D:  01/27/2009  T:  01/28/2009  Job:  [id number]

## 2011-02-16 NOTE — Cardiovascular Report (Signed)
NAME:  Kristina Gibbs, Kristina Gibbs NO.:  [id number]   MEDICAL RECORD NO.:  [id number]          PATIENT TYPE:  INP   LOCATION:  3705                         FACILITY:  [facility]   PHYSICIAN:  Nanetta Batty, M.D.   DATE OF BIRTH:  [birth year]   DATE OF PROCEDURE:  DATE OF DISCHARGE:                            CARDIAC CATHETERIZATION   Kristina Gibbs is a 65 year old female, patient of Dr. Alanda Amass, with a  history of remote anterior wall myocardial infarction in 1995, treated  with balloon angioplasty at [facility].  She developed some  progression of disease and had non-DES stenting in her mid right  coronary artery in 1996.  She was cathed in August 2003 and had widely  patent LAD and RCA stents.  A Cardiolite performed in December 2009  showed no ischemia with breast attenuation artifact.  She was admitted 2  days ago with chest pain consistent with unstable angina, ruled out for  myocardial infarction.  She presents now for diagnostic coronary  angiography.   DESCRIPTION OF PROCEDURE:  The patient was brought to the Second Floor  [facility] in postabsorptive state.  She was premedicated  with p.o. Valium.  Right groin was prepped and draped in the usual  sterile fashion.  A 1% Xylocaine was used for local anesthesia.  A 6-  French sheath was inserted into right femoral artery using standard  Seldinger technique.  A 6-French right-left Judkins diagnostic catheter  as well as 5-French JR-4 catheter and pigtail catheter were used for  selective cholangiography, left ventriculography, subselective left  internal mammary artery angiography and distal abdominal aortography.  Visipaque dye was used for the entirety of the case.  Retrograde aortic,  left ventricular pullback pressures were recorded.  A 200 mcg of  intracoronary nitroglycerin was administered down the left coronary  system via the JR-4 catheter.   HEMODYNAMICS:  Aortic systolic pressure 149,  diastolic pressure 63.  Left ventricle systolic pressure 150, end-diastolic pressure 12.   Selective cholangiography; 6-French JL-4 catheter damped in the ostium  of the left main.  This was switched out for a 5-French JL-4, which  likewise damped.  A 200 mcg of intracoronary nitroglycerin was  administered and there appeared to be a 50-60% ostial stenosis of the  left main within inferior shelf seen best in the LAO cranial view.   LAD; free of significant disease.   Left circumflex; free of significant disease.   Right coronary artery; dominant with widely patent stents in the  midportion with at most 30% in-stent restenosis within the proximal  third and stented segment.   Left ventriculography; RAO left ventriculogram was performed using 25 mL  of Visipaque dye at 12 mL per second.  The overall LVEF was estimated  greater than 60% without focal wall motion abnormalities.   Distal abdominal aortography; distal abdominal aortogram was performed  using 20 mL of Visipaque dye at 20 mL per second.  The renal arteries  were widely patent.  The was a 30-40% ostial left renal artery stenosis.  The infrarenal abdominal aorta and iliac bifurcation appear free of  significant atherosclerotic  changes.   Left internal mammary artery; this vessel was subselectively visualized  and was widely patent.  It was suitable for use during coronary artery  bypass grafting if necessary.   IMPRESSION:  Kristina Gibbs has what appears to be a significant left main  lesion in the 50-60% range with stents with 5-French catheter.  Her  right coronary stents are widely patent.  I am going to discontinue her  Plavix, review of her angiograms with my colleagues prior to making  decision with regards to coronary artery bypass grafting.   Sheath was removed and pressure was held on the groin to achieve  hemostasis.  The patient left lab in stable condition.      Nanetta Batty, M.D.  Electronically  Signed     JB/MEDQ  D:  12/23/2008  T:  12/24/2008  Job:  [id number]   cc:   [facility]  [facility]  [facility]

## 2011-02-16 NOTE — Consult Note (Signed)
NAME:  URSALA, CRESSY NO.:  [id number]   MEDICAL RECORD NO.:  [id number]          PATIENT TYPE:  INP   LOCATION:  3705                         FACILITY:  [facility]   PHYSICIAN:  Cristy Hilts. Jacinto Halim, MD       DATE OF BIRTH:  [birth year]   DATE OF CONSULTATION:  12/21/2008  DATE OF DISCHARGE:                                 CONSULTATION   CHIEF COMPLAINT:  Chest pain.   HISTORY OF PRESENT ILLNESS:  Ms. Nerea Bordenave is a very pleasant,  fairly active 65 year old female with known coronary artery disease.  She had been doing well and has not had a cardiac catheterization since  2003.  She was doing well.  When she was sitting at home, suddenly  developed heaviness in the middle of her chest with radiation to her  neck which she complained of 7/10 in intensity.  She waited for about 10  minutes and eventually took a nitroglycerin, felt like it was being  relieved and again it came back with recurrence.  She took another  nitroglycerin in about 5-10 minutes later and then there was recurrence  of chest pain and it was relieved again.  There was again recurrence of  chest discomfort.  This reminded of her myocardial infarction.  Hence,  she activated the EMS.  On EMS arrival, they gave a third nitroglycerin.  She felt better, but again there was recurrence of chest discomfort.  She was then taken to [facility] Emergency Room.  By the time she came  to the emergency room, she felt better and the chest pain had subsided  and she was asymptomatic since then.   There was no associated nausea, vomiting, or diaphoresis.   REVIEW OF SYSTEMS:  She denies any bowel or bladder dysfunction.  Denies  any recent weight gain or weight loss.  Denies any syncope,  palpitations, shortness of breath, or hemoptysis.  Other systems are  negative.   PRESENT MEDICATIONS:  1. Plavix 75 mg p.o. daily.  2. Nexium 40 mg p.o. daily.  3. Avapro 150 mg p.o. daily.  4. Lopressor 50 mg p.o. daily.  5. Lovaza 2 g  per day.  6. Fosamax 70 mg p.o. q.1 week.  7. Multivitamin 1 p.o. daily.   ALLERGIES:  No known drug allergies.   PAST MEDICAL HISTORY:  Significant for hypertension, hyperlipidemia,  coronary artery disease, history of remote anterior wall myocardial  infarction in 1995, treated with balloon angioplasty at [facility], and history of non drug-eluting stent placement in the mid RCA  in 1996.  She also has had laparoscopic cholecystectomy in January 2010  and hysterectomy in the remote past.   FAMILY HISTORY:  There is no history of premature coronary artery  disease in the family.   SOCIAL HISTORY:  She is married, lives with her husband.  She does not  drink alcohol.  Does not use any tobacco products.  Does walk at least  once per day for 15-20 minutes.   PHYSICAL EXAMINATION:  GENERAL:  She is moderately built, well-  nourished, she appears to be in no  acute distress.  VITAL SIGNS:  Heart rate of 54 beats per minute, respirations 12, blood  pressure is 164 mmHg.  She is afebrile.  CARDIAC:  S1 and S2 was normal without any gallops or murmur.  CHEST:  Clear.  ABDOMEN:  Soft.  EXTREMITIES:  No edema, warm.  EXTREMITIES:  Peripheral arterial exam was normal.   EKG demonstrated normal sinus rhythm.   CBC and BMP was within normal limits.  Point-of-care marker x1 and  cardiac marker x1 has been negative for myocardial injury.   IMPRESSION:  1. Unstable chest pain, very typical for unstable angina in a patient      with known coronary artery disease.  Similar chest pain to her      prior angina.  2. Hypertension, controlled.  3. Hyperlipidemia, controlled.   RECOMMENDATIONS:  I am going to admit the patient to the hospital with  IV Lovenox.  As she is asymptomatic, I am not going to start her on IV  nitro.  However, if she develops recurrent chest discomfort, I may end  up putting her on IV nitroglycerin.  I would put her on nitro paste.  I  will  continue with her home medications and add aspirin which she had  held because of bruising.   I discussed with the patient and her husband regarding proceeding with  stress testing on Monday versus cardiac catheterization.  She has  recently had a stress test prior to her cholecystectomy and this was  negative for myocardial injury and myocardial ischemia.  Hence, the  patient wants to proceed directly with cardiac catheterization.  They  understand less than a percent risk of death, stroke, heart attack,  urgent bypass, bleeding, infection, but not limited to these major  complications.  I will set this up for the upcoming Monday, unless she  develops any recurrent angina, then she may have to be rest of gently.      Cristy Hilts. Jacinto Halim, MD  Electronically Signed     JRG/MEDQ  D:  12/21/2008  T:  12/22/2008  Job:  [id number]   cc:   [facility]

## 2011-02-16 NOTE — Discharge Summary (Signed)
NAME:  Kristina Gibbs, STORR NO.:  [id number]   MEDICAL RECORD NO.:  [id number]          PATIENT TYPE:  INP   LOCATION:  2022                         FACILITY:  [facility]   PHYSICIAN:  Salvatore Decent. Dorris Fetch, M.D.DATE OF BIRTH:  [birth year]   DATE OF ADMISSION:  12/21/2008  DATE OF DISCHARGE:  01/02/2009                               DISCHARGE SUMMARY   ADMITTING DIAGNOSES:  1. History of coronary artery disease (status post myocardial      infarction, percutaneous transluminal coronary angioplasty with      stent to mid right coronary artery).  2. History of hypertension.  3. History of hyperlipidemia.   DISCHARGE DIAGNOSES:  1. History of coronary artery disease (status post myocardial      infarction, percutaneous transluminal coronary angioplasty with      stent to mid right coronary artery).  2. History of hypertension.  3. History of hyperlipidemia.  4. Postoperative atrial fibrillation with rapid ventricular response      (converted into normal sinus rhythm after amiodarone).  5. Postoperative acute blood loss anemia.  6. Postoperative thrombocytopenia.   PROCEDURES:  1. Cardiac catheterization on December 23, 2008, by Dr. Allyson Sabal, for      specific details, please see medical records.  In brief, the      patient was found to have a 60% left main stenosis and a long      segment of 30% in-stent restenosis of the right coronary artery      with preserved ejection fraction.  2. Coronary artery bypass graft x3 (left internal mammary artery to      left anterior descending, saphenous vein graft to obtuse marginal      1, saphenous vein graft to posterior descending artery with      endoscopic vein harvesting of the left leg, by Dr. Dorris Fetch on      December 27, 2008).   HISTORY OF PRESENTING ILLNESS:  This is a 65 year old Caucasian female  with past medical history of coronary artery disease (previous MI, PTCA  with stent to the RCA), history of hypertension, history  of  hyperlipidemia, and previous tobacco abuse who presented to [name]  on December 21, 2008, with chief complaint of chest pain.  Apparently while  the patient was sitting at home, she suddenly developed heaviness in her  chest with radiation to her neck, which was a 7/10 in intensity, 10  being the greatest.  She waited for about 10 minutes and then took a  nitroglycerin.  She did not feel initial relief; however, the heaviness  reoccurred.  She then took another nitro and again it was relieved,  there was yet another recurrence.  She called 911, and they administered  a third nitroglycerin and transferred her to [facility] Emergency Room  for further evaluation and treatment.  EKG showed sinus bradycardia,  otherwise, no abnormalities.  Cardiac enzymes showed a CK-MB to be less  than 1 and troponin less than 0.05 with repeat enzymes again being  negative.  The patient underwent a cardiac catheterization on December 23, 2008, and was found to have multivessel coronary artery  disease,  included a 60% left main stenosis.  Cardiothoracic consultation was  obtained with Dr. Dorris Fetch.  Carotid Doppler ultrasounds done  preoperatively showed mild plaquing in the bifurcation but no  significant internal carotid artery stenosis.  ABIs were normal  bilaterally.  The patient then underwent the aforementioned CABG x3 on  December 27, 2008.   BRIEF HOSPITAL COURSE STAY:  The patient was afebrile and  hemodynamically stable.  She was extubated early evening of surgery.  She was weaned off her drips.  A-line and Swan were removed.  All chest  tubes removed.  By December 28, 2008, followup chest x-ray revealed some  worsening volume loss in the lower lobes, probable mild interstitial  edema but no pneumothorax.  The patient then was found to have acute  blood loss anemia.  H and H were 8.4 and 23.7.  She was given 2 units of  packed red blood cells.  Followup H and H were then 10.3 and 29.7   respectively.  The patient was then transferred from the ICU to PCT for  further convalescence.  The patient then developed atrial fibrillation  with RVR.  She was placed on an amiodarone drip.  She converted to  normal sinus rhythm and was changed to p.o. amiodarone.  She was also  found to have thrombocytopenia.  She continued to slowly progress with  cardiac rehab.  She was volume overloaded and diuresed accordingly.  Currently on postop day #5, the patient is afebrile, heart rate 60s-70s,  BP 97/43, O2 sat 96%.  She was on 2 liters.  She is now on room air.  Preoperative weight 76 kg, today's weight 81.1 kg.   PHYSICAL EXAMINATION:  CARDIOVASCULAR:  Regular rate and rhythm.  PULMONARY:  Decreased at the bases.  ABDOMEN:  Soft, nontender.  Bowel sounds present.  EXTREMITIES:  Positive lower extremity edema bilaterally.  Sternal and  right lower extremity wounds are clean and dry.   Provided the patient maintains normal sinus rhythm and remains afebrile  and hemodynamically stable, she will be discharged on January 02, 2009.   LATEST LABORATORY STUDIES:  BMET done on December 30, 2008, potassium 3.8,  BUN and creatinine 14 and 0.8 respectively.  CBC done on this date, H  and H 10.3 and 29.7, white count 6400, platelet count of 127,000.  Hepatic panel done on December 31, 2008, was within normal limits except  ALT slightly elevated at 50.   Last chest x-ray done on December 30, 2008, showed mild vascular congestion  with pleural effusions, moderate postoperative left lower lobe  atelectasis.   DISCHARGE INSTRUCTIONS:  The patient is to remain on a low-fat, low-salt  diet.  She is not to drive or lift more than 10 pounds.  She is to  continue with her breathing exercise daily.  She is to walk every day  and increase the frequency and duration as tolerates.  She was  instructed she may shower.  She is to cleanse her wounds with mild soap  and water and call the office if any wound problems  arise.   FOLLOWUP APPOINTMENTS:  1. Richard A. Alanda Amass, MD on January 13, 2009, at 11:30 a.m.  2. Salvatore Decent. Dorris Fetch, MD on January 27, 2009, at 11:30 a.m. prior to      this office appointment, a chest x-ray will be obtained.   DISCHARGE MEDICATIONS:  1. Plavix 75 mg p.o. daily.  2. Nexium 40 mg p.o. daily.  3. Crestor 20 mg p.o.  at bedtime.  4. Lovaza 4 p.o. daily.  5. Vitamin D 1000 units p.o. daily.  6. CoQ-10, 50 mg p.o. 2 times daily.  7. Vitamin B 1 p.o. daily.  8. Centrum multivitamin p.o. daily.  9. Fosamax 20 mg weekly.  10.Enteric-coated aspirin 81 mg p.o. daily.  11.Lopressor 12.5 mg p.o. 2 times daily.  12.Amiodarone 200 mg 2 p.o. 2 times daily x4 days, then 200 mg p.o. 2      times daily.  13.Lasix 40 mg p.o. daily for 10 days.  14.KCl 20 mEq p.o. daily for 10 days.  15.Oxycodone 5 mg 1 or 2 p.o. q.4-6 h. as needed for pain.  16.Zyban p.o. daily (if needed).      Doree Fudge, PA      Viviann Spare C. Dorris Fetch, M.D.  Electronically Signed    DZ/MEDQ  D:  01/01/2009  T:  01/01/2009  Job:  [id number]   cc:   Gerlene Burdock A. Alanda Amass, M.D.

## 2011-02-16 NOTE — Consult Note (Signed)
NAME:  Kristina Gibbs, Kristina Gibbs NO.:  [id number]   MEDICAL RECORD NO.:  [id number]          PATIENT TYPE:  INP   LOCATION:  3705                         FACILITY:  [facility]   PHYSICIAN:  Salvatore Decent. Dorris Fetch, M.D.DATE OF BIRTH:  [birth year]   DATE OF CONSULTATION:  12/23/2008  DATE OF DISCHARGE:                                 CONSULTATION   REASON FOR CONSULTATION:  Left main disease.   HISTORY OF PRESENT ILLNESS:  Kristina Gibbs is a 65 year old woman with a  history of known coronary disease.  She previously had an MI in 1994.  She had a PTCA of her LAD but no stent, subsequently had a bare metal  stent placed in her right coronary in 1996.  She was last catheterized  in August 2003 with no significant disease.  She was in her usual state  of good health until Saturday when she developed heaviness in her chest,  this radiated to her neck and she says approximately 7/10 in terms of  severity.  She took a nitroglycerin and felt some initial relief but it  came back within 10 minutes.  The second nitroglycerin was taken, again  there was incomplete relief and then once again worsening.  She called  the EMS, her third nitroglycerin was administered.  She was brought to  the emergency room, in transport her chest pain resolved and she has not  had any further chest pain since admission.  She did rule out for  myocardial infarction.  Today, she underwent cardiac catheterization  where she was found to have a 30% in-stent restenosis on the right.  She  had a 60% ostial left main which damped with a 5-French catheter and she  currently is pain free.   PAST MEDICAL HISTORY:  Significant for coronary artery disease, previous  anterior wall MI, angioplasty of the LAD, angioplasty and stent of the  mid right coronary in 1996, hypertension, hyperlipidemia,  cholecystectomy in January of this year, hysterectomy in the past.   MEDICATIONS ON ADMISSION:  1. Plavix 75 mg daily.  2. Nexium  40 mg daily.  3. Avapro 150 mg daily.  4. Metoprolol 50 mg daily.  5. Crestor 20 mg daily.  6. Lovaza 1 g 4 times daily.  7. Fosamax 70 mg weekly.  8. Zyban 2 tablets daily.  9. She also takes vitamin E, vitamin D, Co-enzyme Q10 and vitamin B as      well as multivitamin.   ALLERGIES:  She lists CODEINE as an allergy, elsewhere has noted  HYDROCORTISONE as an allergy.   FAMILY HISTORY:  Negative for premature coronary artery disease.   SOCIAL HISTORY:  She smokes currently about 6-7 cigarettes daily up  until the time of admission.  She lives with her husband.   REVIEW OF SYSTEMS:  The patient complains of some recent mental  difficulties.  She denies any stroke or TIA but has had some memory  problems.  She says sometimes does not think clearly.  No history of  excessive bruising.  She has had spontaneous epistaxis.  She did well  following her gallbladder surgery in January.  All other systems  negative.   PHYSICAL EXAMINATION:  Kristina Gibbs is a 65 year old woman in no acute  distress.  GENERAL:  She is well developed and well nourished, neurologically she  is alert, oriented x3, but no focal deficits.  HEENT:  Unremarkable.  She is wearing glasses.  NECK:  Supple without thyromegaly, adenopathy or bruits.  CARDIAC:  Regular rate and rhythm.  Normal S1 and S2.  No rubs, murmurs,  or gallops.  ABDOMEN:  Soft and nontender.  EXTREMITIES:  Without clubbing, cyanosis, or edema.  She has 2+ pulses  in dorsalis pedis and posterior tibial bilaterally.  SKIN:  Warm and dry.   LABORATORY DATA:  Cardiac catheterization as previously noted.  Chest x-  ray shows no active disease.  EKG shows sinus bradycardia with no ST  changes.  Her PT is 13.8, PTT 39, TSH 1.577, troponin less than 0.1.  CK  was 40 with an MB of 0.8.  Sodium 137, potassium 4.2.  BUN and  creatinine are 15 and 0.9.  Cholesterol 139, HDL 38, LDL 83.  White  count 4.7, hematocrit 40, platelets 221.   IMPRESSION:   Kristina Gibbs is a 65 year old woman who presents with  symptoms consistent with classical unstable angina, this resolved en  route to the hospital suggesting that she may have had transient clot  formation which subsequently resolved.  She is on Plavix for a chronic  right coronary stent.  At catheterization, she has 60% left main  stenosis and 30% in-stent restenosis in the right which is a relatively  long segment narrowing.  Coronary artery bypass graft is indicated in  the setting of left main disease for survival benefits as well as relief  of symptoms.  I had a long discussion with the patient and her family  regarding indications, risks, benefits and alternatives.  She  understands the risks include but not limited to death, stroke, MI, DVT,  PE, bleeding, possible need for transfusions, infections as well as  other organ system dysfunction.  She has been on Plavix which presents  increased risk for bleeding complications, we would allow the Plavix to  wear off.  We will attempt to do a Plavix activity assay today.  We will  tentatively schedule the surgery for Friday as that will be 4 days after  Plavix and she did receive Plavix today.  Ideally, we would wait 7 days  if necessary and we may have to depending on the results of the activity  assay, but given her left main disease and unstable presentation, I  would like to avoid delaying more than absolutely necessary.  Ms.  Gibbs and her family understand the risks and benefits and also  understand the issues regarding Plavix and are in agreement with a plan  as outlined.      Salvatore Decent Dorris Fetch, M.D.  Electronically Signed     SCH/MEDQ  D:  12/23/2008  T:  12/24/2008  Job:  [id number]   cc:   Gerlene Burdock A. Alanda Amass, M.D.

## 2011-02-19 NOTE — Op Note (Signed)
NAME:  [name], [name]                     ACCOUNT NO.:  [id number]   MEDICAL RECORD NO.:  [id number]                   PATIENT TYPE:  AMB   LOCATION:  DSC                                  FACILITY:  [facility]   PHYSICIAN:  Abigail Miyamoto, M.D.              DATE OF BIRTH:  1945-10-20   DATE OF PROCEDURE:  10/22/2003  DATE OF DISCHARGE:                                 OPERATIVE REPORT   PREOPERATIVE DIAGNOSIS:  Back mass.   POSTOPERATIVE DIAGNOSIS:  Back mass.   PROCEDURE:  Excision of back mass.   SURGEON:  Abigail Miyamoto, M.D.   ANESTHESIA:  1% Lidocaine and monitored anesthesia care.   ESTIMATED BLOOD LOSS:  Minimal.   FINDINGS:  The patient was found to have approximately a 4-cm mass on the  right upper back which appeared  to be consistent with a lipoma. The mass  was sent to pathology.   DESCRIPTION OF PROCEDURE:  The patient was brought to the operating room and  identified  as Kristina Gibbs. She was placed in the left lateral  decubitus position and anesthesia  was induced. The mass in the upper right  back was prepped and draped in the usual sterile manner. The skin  overlying  the mass was then anesthetized with 1% Lidocaine with epinephrine.   An incision was made transversely across the mass with a #15 blade. The  incision was carried down through the subcutaneous tissue to the mass which  appeared to be consistent with a lipoma and was easily excised out of the  wound. The mass was then sent to pathology for identification.   The rest of the wound was probed and no further  masses were identified. The  subcutaneous tissue was then closed with interrupted 3-0 Vicryl sutures. The  skin was closed with a running 4-0 Vicryl suture. Steri-Strips, gauze and  tape were then applied.   The patient tolerated the procedure well. All sponge, instrument and needle  counts were correct at the end of the procedure. The patient was then taken  in stable condition from  the operating room to the recovery room.                                               Abigail Miyamoto, M.D.    DB/MEDQ  D:  10/22/2003  T:  10/22/2003  Job:  [id number]

## 2011-02-19 NOTE — Cardiovascular Report (Signed)
NAME:  Kristina Gibbs, Kristina Gibbs                     ACCOUNT NO.:  [id number]   MEDICAL RECORD NO.:  [id number]                   PATIENT TYPE:  OIB   LOCATION:  2899                                 FACILITY:  [facility]   PHYSICIAN:  Richard A. Alanda Amass, M.D.          DATE OF BIRTH:  06-18-1946   DATE OF PROCEDURE:  05/08/2002  DATE OF DISCHARGE:                              CARDIAC CATHETERIZATION   PROCEDURE:  Retrograde central aortic catheterization, selective coronary  angiography via Judkins technique, LV angiogram RAO and LAO projection,  abdominal aortic angiogram hand injection midstream PA projection,  subselective LIMA left subclavian injection by hand.   DESCRIPTION OF PROCEDURE:  The patient was brought to the second floor CP  lab in the postabsorptive state after 5 mg fentanyl p.o. premedication.  Preoperative laboratory showed BUN and creatinine of 11 and 0.9.  Normal CBC  and differential and coags.   The right groin was prepped and draped in the usual sterile fashion.  1%  lidocaine was used for local anesthesia and the RFA was entered with a  single anterior puncture using an 18 thin-wall needle.  A 6 French short  Daig sidearm sheath was inserted without difficulty.  A 6 French 4 cm taper  right coronary catheter was used for right coronary artery angiography and  subselective LIMA.  A 3.5 [place] Scimed left coronary catheter was used for  selective left coronary angiography.  LV angiogram was done in the RAO and  LAO projections with 25 cc at 14 cc per second and 20 cc at 12 cc per  second, respectively.  Pullback pressure of CA was performed which showed no  gradient across the aortic valve.  The catheters were removed.  The sidearm  sheath was flushed.  The patient was taken to the holding area for sheath  removal and pressure hemostasis over the right groin.  She tolerated the  diagnostic procedure well.  She was given 2 mg of Nubain for sedation IV at  the beginning  of the procedure.   PRESSURES:  LV 160/0, LV EDP 18 mmHg.  CA 160/80 mmHg.  There is no gradient  across the aortic valve on catheter pullback.   Fluoroscopy showed +1 calcification of the LAD and right coronary.  The  previously placed tandem mid RCA stents were easily visualized  fluoroscopically. There was no intracardiac or valvular calcification seen.   1. The main left coronary artery was normal.  2. The left anterior descending artery gave off two septal perforator     branches followed by a moderately large first diagonal branch.  There was     a second diagonal branch from the mid LAD of moderate size and a third     diagonal branch from the junction of the distal third of the LAD of     moderate size.  The area of prior PTCA between the first two diagonal     branches had smooth,  less than 30% narrowing with good flow to the distal     LAD at the apex and bifurcation.  There was no significant LAD or     diagonal stenosis.  3. The circumflex artery gave off a small bifurcating normal OM1, a very     small OM2 from the proximal third.  The OM3 had 30% smooth narrowing     proximally with a proximal bifurcation and large distal branch which was     normal.  The PABG branch was normal and the distal circumflex bifurcating     them was normal.  4. The right coronary artery was a dominant vessel.  The tandem PS15/30 and     GR3.0/20 stents (placed April 22, 1995) were widely patent.  There was     approximately 30% narrowing in the proximal third of the PS15/30 stent     and approximately 30% narrowing in the midportion of the GR3.0/20 stent.     This is unchanged, was smooth, there was excellent flow through the     stents to a normal PDA and PLA branch.   A large RV marginal branch arose from the mid LAD within the stents.  There  was a 60% ostial lesion with good flow and a 50% segment lesion in the  proximal third of the RV branch before it bifurcated.   A second RV branch  of small to moderate size arose from the distal portion  of the stents and had no significant stenosis.   1. LV angiogram demonstrated a very small area of hypokinesis and/or (late     systolic relaxation) of the mid anterolateral wall.  The remainder of the     ventricle contracted normally.  There was angiographic LVH and no mitral     regurgitation present.  2. The LIMA was widely patent with no subclavian stenosis and normal     antegrade left vertebral flow.  3. The renal arteries were normal bilaterally with normal infrarenal     abdominal aorta and proximal iliac bifurcation on limited injection.   DISCUSSION:  Kristina Gibbs is a 65 year old married mother of three with three  grandchildren who unfortunately, still smokes 1/2 pack of cigarettes a day.  She has known coronary artery disease, systemic hypertension, and  hyperlipidemia.  She suffered an AWSEMI with ongoing ischemia in June of  1995 and underwent emergency LAD PTCA at [facility] in  June of 1995.  She has had no restenosis longterm from this.  She has had  progression of disease in the right coronary artery and required PTCA in  June of 1995, repeat PTCA in September of 1995 for restenosis, and  subsequent tandem stenting in July of 1996 segmentally of the mid RCA with  no restenosis on prior catheterization of October of 1999 or present  catheterization.  She has had good LV function.  She recently had recurrent  chest pain that was difficult to distinguish between ischemia or other  etiologies.  In view of her risk factors, a Cardiolite was done which  suggested ischemia in two distributions including the inferior and  anterolateral wall.  Fortunately, the patient has no restenosis of her  remote LAD PTCA and no restenosis of her tandem RCA stents.  She does have  mild to moderate disease of the mid RV branch arising from the stents in the mid RCA.  I have recommended continued medical therapy,  reassurance to the  patient.  She also requires continued  antihypertensive and lipid-lowering  therapy and will be once again counseled on the necessity to discontinue  smoking.   CATHETERIZATION DIAGNOSES:  1. Chest pain etiology not determined.  2. False positive Cardiolite suggesting ischemia in July 24, 2002,     probably related to [facility] - continuation with recent lifting.  3. No restenosis of tandem RCA stents placed April 22, 1995.  4. No restenosis proximal/mid LAD from prior emergency PTCA January 18, 1994.  5. Normal left ventricular function, small focal mid anterolateral wall     motion abnormality.  6. Systemic hypertension, normal renal arteries.  7. COPD, cigarette abuse.  8. Hyperlipidemia.                                               Richard A. Alanda Amass, M.D.    RAW/MEDQ  D:  05/08/2002  T:  05/13/2002  Job:  [id number]   cc:   Delorse Lek, M.D.   Madaline Savage, M.D.

## 2012-06-05 ENCOUNTER — Encounter (HOSPITAL_COMMUNITY): Payer: Self-pay | Admitting: Neurology

## 2012-06-05 ENCOUNTER — Emergency Department (HOSPITAL_COMMUNITY)
Admission: EM | Admit: 2012-06-05 | Discharge: 2012-06-05 | Disposition: A | Discharge status: Home / Self Care | Payer: Medicare Other | Attending: Cardiovascular Disease | Admitting: Cardiovascular Disease

## 2012-06-05 ENCOUNTER — Emergency Department (HOSPITAL_COMMUNITY): Payer: Medicare Other

## 2012-06-05 DIAGNOSIS — I2510 Atherosclerotic heart disease of native coronary artery without angina pectoris: Secondary | ICD-10-CM | POA: Insufficient documentation

## 2012-06-05 DIAGNOSIS — E785 Hyperlipidemia, unspecified: Secondary | ICD-10-CM

## 2012-06-05 DIAGNOSIS — R6884 Jaw pain: Secondary | ICD-10-CM | POA: Insufficient documentation

## 2012-06-05 DIAGNOSIS — I10 Essential (primary) hypertension: Secondary | ICD-10-CM | POA: Insufficient documentation

## 2012-06-05 DIAGNOSIS — I252 Old myocardial infarction: Secondary | ICD-10-CM | POA: Insufficient documentation

## 2012-06-05 DIAGNOSIS — Z87891 Personal history of nicotine dependence: Secondary | ICD-10-CM | POA: Insufficient documentation

## 2012-06-05 DIAGNOSIS — Z951 Presence of aortocoronary bypass graft: Secondary | ICD-10-CM | POA: Diagnosis present

## 2012-06-05 DIAGNOSIS — E7800 Pure hypercholesterolemia, unspecified: Secondary | ICD-10-CM | POA: Insufficient documentation

## 2012-06-05 DIAGNOSIS — M549 Dorsalgia, unspecified: Secondary | ICD-10-CM | POA: Insufficient documentation

## 2012-06-05 DIAGNOSIS — R079 Chest pain, unspecified: Secondary | ICD-10-CM

## 2012-06-05 DIAGNOSIS — R072 Precordial pain: Secondary | ICD-10-CM | POA: Insufficient documentation

## 2012-06-05 HISTORY — DX: Essential (primary) hypertension: I10

## 2012-06-05 HISTORY — DX: Pure hypercholesterolemia, unspecified: E78.00

## 2012-06-05 LAB — COMPREHENSIVE METABOLIC PANEL
ALT: 18 U/L (ref 0–35)
AST: 18 U/L (ref 0–37)
Calcium: 9.1 mg/dL (ref 8.4–10.5)
Potassium: 4.6 mEq/L (ref 3.5–5.1)
Sodium: 135 mEq/L (ref 135–145)
Total Protein: 6.8 g/dL (ref 6.0–8.3)

## 2012-06-05 LAB — POCT I-STAT TROPONIN I

## 2012-06-05 LAB — URINE MICROSCOPIC-ADD ON

## 2012-06-05 LAB — URINALYSIS, ROUTINE W REFLEX MICROSCOPIC
Glucose, UA: NEGATIVE mg/dL
Hgb urine dipstick: NEGATIVE
Specific Gravity, Urine: 1.012 (ref 1.005–1.030)
pH: 5 (ref 5.0–8.0)

## 2012-06-05 LAB — CBC
MCH: 29.8 pg (ref 26.0–34.0)
MCHC: 33.5 g/dL (ref 30.0–36.0)
Platelets: 186 10*3/uL (ref 150–400)
RBC: 4.5 MIL/uL (ref 3.87–5.11)

## 2012-06-05 LAB — TROPONIN I: Troponin I: <0.3 ng/mL (ref <0.30)

## 2012-06-05 MED ORDER — RANOLAZINE ER 500 MG PO TB12: 1000.0000 mg | ORAL_TABLET | Freq: Two times a day (BID) | ORAL | Status: DC

## 2012-06-05 MED ORDER — SODIUM CHLORIDE 0.9 % IV SOLN
1000.0000 mL | INTRAVENOUS | Status: DC
  Administered 2012-06-05: 1000 mL via INTRAVENOUS

## 2012-06-05 MED ORDER — NITROGLYCERIN 0.4 MG SL SUBL: 0.4000 mg | SUBLINGUAL_TABLET | SUBLINGUAL | Status: DC | PRN

## 2012-06-05 MED ORDER — NITROGLYCERIN 0.4 MG/SPRAY TL SOLN: 1.0000 | Status: DC | PRN

## 2012-06-05 NOTE — ED Provider Notes (Signed)
History     CSN: [id number]  Arrival date & time 06/05/12  1206   First MD Initiated Contact with Patient 06/05/12 1211      Chief Complaint  Patient presents with  . Chest Pain    (Consider location/radiation/quality/duration/timing/severity/associated sxs/prior treatment) HPI  66 y.o. female in no acute distress complaining of sternal chest pain onset at rest when she was on the couch this morning at about 10 AM. Pain is exacerbated by exertion. Radiates to bilateral back and jaw, described as sharp, 8/10,  Pain is typical of her anginal episodes she took 3 nitroglycerin sublingual at home with no relief. She denies shortness of breath nausea vomiting or diaphoresis. EMS was called they gave her another nitroglycerin dose and pain fully resolved. He gave her full dose aspirin en route. This feels similar to prior heart attacks specifically how was nonresponsive to nitroglycerin.   Past medical history significant for ACS, CAD, angina, hypertension, high cholesterol  Cardiologist is Dr. Alanda Amass Last stress test was 3 months ago Patient had three-vessel CABG 3 years ago 2 stents were placed prior to CABG   Past Medical History  Diagnosis Date  . Hypertension   . Hypercholesteremia     Past Surgical History  Procedure Date  . Coronary artery bypass graft   . Abdominal hysterectomy     No family history on file.  History  Substance Use Topics  . Smoking status: Former Games developer  . Smokeless tobacco: Not on file  . Alcohol Use: 0.6 oz/week    1 Glasses of wine per week    OB History    Grav Para Term Preterm Abortions TAB SAB Ect Mult Living                  Review of Systems  Constitutional: Negative for fever.  Respiratory: Negative for shortness of breath.   Cardiovascular: Positive for chest pain.  Gastrointestinal: Negative for nausea, vomiting, abdominal pain and diarrhea.  All other systems reviewed and are negative.    Allergies  Codeine  Home  Medications  No current outpatient prescriptions on file.  BP 123/62  Pulse 56  Temp 97.9 F (36.6 C) (Oral)  SpO2 97%  Physical Exam  Nursing note and vitals reviewed. Constitutional: She is oriented to person, place, and time. She appears well-developed and well-nourished. No distress.  HENT:  Head: Normocephalic.  Eyes: Conjunctivae and EOM are normal. Pupils are equal, round, and reactive to light.  Neck: Normal range of motion. No JVD present.  Cardiovascular: Normal rate, normal heart sounds and intact distal pulses.  Exam reveals no gallop and no friction rub.   No murmur heard. Pulmonary/Chest: Effort normal and breath sounds normal. No respiratory distress. She has no wheezes. She has no rales. She exhibits no tenderness.  Abdominal: Soft. Bowel sounds are normal. She exhibits no distension and no mass. There is no tenderness. There is no rebound and no guarding.  Musculoskeletal: Normal range of motion. She exhibits no edema.  Neurological: She is alert and oriented to person, place, and time.  Psychiatric: She has a normal mood and affect.    ED Course  Procedures (including critical care time)  Labs Reviewed  COMPREHENSIVE METABOLIC PANEL - Abnormal; Notable for the following:    GFR calc non Af Amer 53 (*)     GFR calc Af Amer 62 (*)     All other components within normal limits  URINALYSIS, ROUTINE W REFLEX MICROSCOPIC - Abnormal; Notable for the  following:    Leukocytes, UA TRACE (*)     All other components within normal limits  CBC  MAGNESIUM  POCT I-STAT TROPONIN I  URINE MICROSCOPIC-ADD ON  TROPONIN I   Dg Chest Portable 1 View  06/05/2012  *RADIOLOGY REPORT*  Clinical Data: Chest pain  PORTABLE CHEST - 1 VIEW  Comparison: 05/09/2010  Findings: Previous CABG.  Heart size upper limits normal.  Chronic blunting of the right lateral costophrenic angle.  Lungs otherwise clear.  IMPRESSION:  No acute disease post CABG.   Original Report Authenticated By: Osa Craver, M.D.     Date: 06/05/2012  Rate: 54  Rhythm: normal sinus rhythm  QRS Axis: normal  Intervals: normal  ST/T Wave abnormalities: normal  Conduction Disutrbances:none  Narrative Interpretation:   Old EKG Reviewed: unchanged    No diagnosis found.    MDM  66 y.o. female with history of MI status post 3 vessel CABG in 2010 complaining of chest pain at rest onset this morning at 9 AM minimal relief with the first 3 sublingual nitroglycerin that she took. Patient had full dose aspirin in route via EMS. Pain is now fully resolved after fourth dose of nitroglycerin given by EMS.   Physical exam is unremarkable, EKG shows no changes and first troponin is negative. Blood work and urinalysis are otherwise unremarkable.   [facility] cardiologist will come to evaluate her.  1530 Pt seen and evaluated remains CP free, updated her that cardiology consult is pending.   Case signed out to Dr. Patria Mane at shift change.        Wynetta Emery, PA-C 06/06/12 1024

## 2012-06-05 NOTE — ED Notes (Signed)
Pt provided update. Waiting for cycled troponin, consult from cardiology.

## 2012-06-05 NOTE — H&P (Addendum)
Kristina Gibbs is an 66 y.o. female.   Chief Complaint: Chest pain HPI:   The patient is a 66 yo female with a history of cad, , CABG in 2010.  In Dec 2010a left heart cath showed an occluded vein graft to the right, occluded graft to the circ, patent stent to the right and LAD.  She had a 60% left main, 80% distal codominant circ and 50% bifurcation PDA/PLA lesion treat medically.  HEr last myoview stress test was 03/2012 and showed no significant ischemia.  Her last office visit was 02/2012.  Her history also includes HTN, HLD.    She presented with CP which began at 1030hrs while seated on the couch.  She took three SL nitro with no relief.  EMS gave her a fourth and the pain eventually resolved.  No EKG changes noted.  Past Medical History  Diagnosis Date  . Hypertension   . Hypercholesteremia     Past Surgical History  Procedure Date  . Coronary artery bypass graft   . Abdominal hysterectomy     No family history on file. Social History:  reports that she has quit smoking. She does not have any smokeless tobacco history on file. She reports that she drinks about .6 ounces of alcohol per week. She reports that she does not use illicit drugs.  Allergies:  Allergies  Allergen Reactions  . Codeine    Medications  Crestor 20 mg daily, Isordil 60 mg daily, Avapro 75 mg daily, baby aspirin daily, C enzyme Q., Fishel, Lopressor 12.5 mg daily, Nexium 40 mg daily, Plavix 75 mg daily, Ranexa 500 mg twice a day, vitamin D3, calcium, cetirizine when necessary, Dyazide when necessary, Amitiza 24, for Fosamax every month.  (Not in a hospital admission)  Results for orders placed during the hospital encounter of 06/05/12 (from the past 48 hour(s))  CBC     Status: Normal   Collection Time   06/05/12 12:18 PM      Component Value Range Comment   WBC 4.7  4.0 - 10.5 K/uL    RBC 4.50  3.87 - 5.11 MIL/uL    Hemoglobin 13.4  12.0 - 15.0 g/dL    HCT 96.0  45.4 - 09.8 %    MCV 88.9  78.0 -  100.0 fL    MCH 29.8  26.0 - 34.0 pg    MCHC 33.5  30.0 - 36.0 g/dL    RDW 11.9  14.7 - 82.9 %    Platelets 186  150 - 400 K/uL   COMPREHENSIVE METABOLIC PANEL     Status: Abnormal   Collection Time   06/05/12 12:18 PM      Component Value Range Comment   Sodium 135  135 - 145 mEq/L    Potassium 4.6  3.5 - 5.1 mEq/L    Chloride 102  96 - 112 mEq/L    CO2 27  19 - 32 mEq/L    Glucose, Bld 86  70 - 99 mg/dL    BUN 16  6 - 23 mg/dL    Creatinine, Ser 5.62  0.50 - 1.10 mg/dL    Calcium 9.1  8.4 - 13.0 mg/dL    Total Protein 6.8  6.0 - 8.3 g/dL    Albumin 3.5  3.5 - 5.2 g/dL    AST 18  0 - 37 U/L    ALT 18  0 - 35 U/L    Alkaline Phosphatase 51  39 - 117 U/L  Total Bilirubin 0.5  0.3 - 1.2 mg/dL    GFR calc non Af Amer 53 (*) >90 mL/min    GFR calc Af Amer 62 (*) >90 mL/min   MAGNESIUM     Status: Normal   Collection Time   06/05/12 12:18 PM      Component Value Range Comment   Magnesium 2.0  1.5 - 2.5 mg/dL   POCT I-STAT TROPONIN I     Status: Normal   Collection Time   06/05/12 12:28 PM      Component Value Range Comment   Troponin i, poc 0.00  0.00 - 0.08 ng/mL    Comment 3            URINALYSIS, ROUTINE W REFLEX MICROSCOPIC     Status: Abnormal   Collection Time   06/05/12 12:51 PM      Component Value Range Comment   Color, Urine YELLOW  YELLOW    APPearance CLEAR  CLEAR    Specific Gravity, Urine 1.012  1.005 - 1.030    pH 5.0  5.0 - 8.0    Glucose, UA NEGATIVE  NEGATIVE mg/dL    Hgb urine dipstick NEGATIVE  NEGATIVE    Bilirubin Urine NEGATIVE  NEGATIVE    Ketones, ur NEGATIVE  NEGATIVE mg/dL    Protein, ur NEGATIVE  NEGATIVE mg/dL    Urobilinogen, UA 0.2  0.0 - 1.0 mg/dL    Nitrite NEGATIVE  NEGATIVE    Leukocytes, UA TRACE (*) NEGATIVE   URINE MICROSCOPIC-ADD ON     Status: Normal   Collection Time   06/05/12 12:51 PM      Component Value Range Comment   Squamous Epithelial / LPF RARE  RARE    WBC, UA 0-2  <3 WBC/hpf    RBC / HPF 0-2  <3 RBC/hpf    Bacteria, UA  RARE  RARE    Dg Chest Portable 1 View  06/05/2012  *RADIOLOGY REPORT*  Clinical Data: Chest pain  PORTABLE CHEST - 1 VIEW  Comparison: 05/09/2010  Findings: Previous CABG.  Heart size upper limits normal.  Chronic blunting of the right lateral costophrenic angle.  Lungs otherwise clear.  IMPRESSION:  No acute disease post CABG.   Original Report Authenticated By: Thora Lance III, M.D.     ROS  Blood pressure 111/39, pulse 52, temperature 97.9 F (36.6 C), temperature source Oral, resp. rate 14, SpO2 100.00%. Physical Exam  Constitutional: She is oriented to person, place, and time. She appears well-developed and well-nourished. No distress.  HENT:  Head: Normocephalic.  Mouth/Throat: Oropharynx is clear and moist. No oropharyngeal exudate.  Eyes: EOM are normal. Pupils are equal, round, and reactive to light.  Neck: Neck supple. No JVD present. No thyromegaly present.  Cardiovascular: Regular rhythm, S1 normal and S2 normal.   No extrasystoles are present. Bradycardia present.  PMI is not displaced.  Exam reveals gallop. Exam reveals no S3, no S4 and no friction rub.     No systolic murmur is present   No diastolic murmur is present  Pulses:      Carotid pulses are 2+ on the right side, and 2+ on the left side.      Radial pulses are 2+ on the right side, and 2+ on the left side.       Femoral pulses are 2+ on the right side, and 2+ on the left side.      Popliteal pulses are 2+ on the right side, and 2+  on the left side.       Dorsalis pedis pulses are 2+ on the right side, and 2+ on the left side.       Posterior tibial pulses are 2+ on the right side, and 2+ on the left side.  Respiratory: Breath sounds normal. No respiratory distress. She has no wheezes. She has no rales. She exhibits no tenderness.  GI: Bowel sounds are normal. She exhibits no distension. There is no tenderness.  Musculoskeletal: She exhibits no edema.  Lymphadenopathy:    She has no cervical adenopathy.    Neurological: She is alert and oriented to person, place, and time. She has normal reflexes. No cranial nerve deficit.  Skin: Skin is warm and dry.  Psychiatric: She has a normal mood and affect.     Assessment/Plan Patient Active Hospital Problem List: CORONARY ARTERY DISEASE (08/31/2010) Chest pain (06/05/2012) HLD (hyperlipidemia) (06/05/2012)  Plan:  Increase Ranexa to 1000mg  BID, If next set of enzymes negative, DC home.  Follow up with Dr. Alanda Amass. Will also give nitro Spray.   HAGER, BRYAN 06/05/2012, 4:09 PM

## 2012-06-05 NOTE — ED Notes (Signed)
Per EMS- Developed cp at rest this morning. Only slightly relieved after 3 nitro which she gave herself, left sided radiation to neck and jaw. Pt has SOB. EKG unremarkable. 140/60, HR 58. Pain free at this time after 1 nitro from EMS. 324 aspirin given. Skin warm and dry. A x 4.

## 2012-06-05 NOTE — H&P (Signed)
I have seen and examined the patient along with Wilburt Finlay, PA.  I have reviewed the chart, notes and new data.  I agree with PA/NP's note. Old cath findings reviewed.  Key new complaints: angina (typical sensation, but occurring at rest) relieved by 4th SL NTG from EMS Key examination changes: normal exam, without clinical signs of CHF Key new findings / data: 1st set of enzymes is normal, ECG low risk; nuclear perfusion study in June 2013 was normal.  PLAN: If second troponin is normal, DC home. BP and HR preclude increase in beta blocker or long acting nitrate dose. Increase Ranexa to 1000 mg BID. Give Rx for nitrolingual spray, with longer "shelf life" She has follow up appointment with Dr. Alanda Amass on September 23.  Thurmon Fair, MD, [facility] [phone] 06/05/2012, 4:02 PM

## 2012-06-05 NOTE — ED Provider Notes (Signed)
4:38 PM The patient was seen by cardiology and they believe that the second set of cardiac enzymes is normal the patient was safe for discharge home.  She has followup with her cardiologist.  He requested that I prescribe the patient sublingual nitroglycerin spray and Ranexa  Lyanne Co, MD 06/05/12 [phone]

## 2012-06-06 NOTE — ED Provider Notes (Signed)
Medical screening examination/treatment/procedure(s) were performed by non-physician practitioner and as supervising physician I was immediately available for consultation/collaboration.   Rolan Bucco, MD 06/06/12 [phone]

## 2012-09-29 ENCOUNTER — Other Ambulatory Visit (HOSPITAL_COMMUNITY): Payer: Self-pay | Admitting: Cardiovascular Disease

## 2012-09-29 DIAGNOSIS — I2109 ST elevation (STEMI) myocardial infarction involving other coronary artery of anterior wall: Secondary | ICD-10-CM

## 2012-09-29 DIAGNOSIS — R011 Cardiac murmur, unspecified: Secondary | ICD-10-CM

## 2012-10-09 ENCOUNTER — Ambulatory Visit (HOSPITAL_COMMUNITY): Payer: Medicare Other

## 2012-10-11 ENCOUNTER — Ambulatory Visit (HOSPITAL_COMMUNITY)
Admission: RE | Admit: 2012-10-11 | Discharge: 2012-10-11 | Disposition: A | Discharge status: Home / Self Care | Payer: Medicare Other | Source: Ambulatory Visit | Attending: Cardiovascular Disease | Admitting: Cardiovascular Disease

## 2012-10-11 DIAGNOSIS — I2510 Atherosclerotic heart disease of native coronary artery without angina pectoris: Secondary | ICD-10-CM | POA: Insufficient documentation

## 2012-10-11 DIAGNOSIS — I2109 ST elevation (STEMI) myocardial infarction involving other coronary artery of anterior wall: Secondary | ICD-10-CM

## 2012-10-11 DIAGNOSIS — I369 Nonrheumatic tricuspid valve disorder, unspecified: Secondary | ICD-10-CM | POA: Insufficient documentation

## 2012-10-11 DIAGNOSIS — I059 Rheumatic mitral valve disease, unspecified: Secondary | ICD-10-CM | POA: Insufficient documentation

## 2012-10-11 DIAGNOSIS — I379 Nonrheumatic pulmonary valve disorder, unspecified: Secondary | ICD-10-CM | POA: Insufficient documentation

## 2012-10-11 DIAGNOSIS — I10 Essential (primary) hypertension: Secondary | ICD-10-CM | POA: Insufficient documentation

## 2012-10-11 DIAGNOSIS — R011 Cardiac murmur, unspecified: Secondary | ICD-10-CM

## 2012-10-11 NOTE — Progress Notes (Signed)
[facility]   2D echo completed 10/11/2012.   Cindy Aalayah Riles, RDCS   

## 2012-12-15 ENCOUNTER — Other Ambulatory Visit: Payer: Self-pay | Admitting: Family Medicine

## 2012-12-15 LAB — COMPLETE METABOLIC PANEL WITH GFR
ALT: 21 U/L (ref 0–35)
AST: 19 U/L (ref 0–37)
Albumin: 4.1 g/dL (ref 3.5–5.2)
Alkaline Phosphatase: 47 U/L (ref 39–117)
BUN: 19 mg/dL (ref 6–23)
CO2: 29 mEq/L (ref 19–32)
Calcium: 9.2 mg/dL (ref 8.4–10.5)
Chloride: 106 mEq/L (ref 96–112)
Creat: 1.08 mg/dL (ref 0.50–1.10)
GFR, Est African American: 61 mL/min
GFR, Est Non African American: 53 mL/min — ABNORMAL LOW
Glucose, Bld: 101 mg/dL — ABNORMAL HIGH (ref 70–99)
Potassium: 4.5 mEq/L (ref 3.5–5.3)
Sodium: 142 mEq/L (ref 135–145)
Total Bilirubin: 0.7 mg/dL (ref 0.3–1.2)
Total Protein: 6.7 g/dL (ref 6.0–8.3)

## 2012-12-15 LAB — TSH: TSH: 4.171 u[IU]/mL (ref 0.350–4.500)

## 2012-12-16 LAB — VITAMIN D 25 HYDROXY (VIT D DEFICIENCY, FRACTURES): Vit D, 25-Hydroxy: 53 ng/mL (ref 30–89)

## 2012-12-19 LAB — NMR LIPOPROFILE WITH LIPIDS
Cholesterol, Total: 166 mg/dL (ref <200)
HDL Particle Number: 37.8 umol/L (ref >=30.5)
HDL Size: 9.4 nm (ref >=9.2)
HDL-C: 68 mg/dL (ref >=40)
LDL (calc): 78 mg/dL (ref <100)
LDL Particle Number: 925 nmol/L (ref <1000)
LDL Size: 21 nm (ref >20.5)
LP-IR Score: <25 (ref <=45)
Large HDL-P: 10.9 umol/L (ref >=4.8)
Large VLDL-P: <0.8 nmol/L (ref <=2.7)
Small LDL Particle Number: 329 nmol/L (ref <=527)
Triglycerides: 102 mg/dL (ref <150)
VLDL Size: 41.1 nm (ref >=46.6)

## 2013-01-10 ENCOUNTER — Encounter: Payer: Self-pay | Admitting: Family Medicine

## 2013-02-06 ENCOUNTER — Other Ambulatory Visit: Payer: Self-pay | Admitting: Family Medicine

## 2013-02-19 ENCOUNTER — Other Ambulatory Visit: Payer: Self-pay

## 2013-02-19 MED ORDER — OMEGA-3-ACID ETHYL ESTERS 1 G PO CAPS: 2.0000 g | ORAL_CAPSULE | Freq: Two times a day (BID) | ORAL | Status: DC

## 2013-03-01 ENCOUNTER — Other Ambulatory Visit: Payer: Self-pay | Admitting: *Deleted

## 2013-03-01 ENCOUNTER — Other Ambulatory Visit: Payer: Self-pay

## 2013-03-01 MED ORDER — IRBESARTAN 75 MG PO TABS: 75.0000 mg | ORAL_TABLET | Freq: Every day | ORAL | Status: DC

## 2013-03-01 NOTE — Telephone Encounter (Signed)
Rx was sent to pharmacy electronically. 

## 2013-03-26 ENCOUNTER — Other Ambulatory Visit: Payer: Self-pay | Admitting: Family Medicine

## 2013-04-03 ENCOUNTER — Encounter: Payer: Self-pay | Admitting: Family Medicine

## 2013-04-03 ENCOUNTER — Ambulatory Visit (INDEPENDENT_AMBULATORY_CARE_PROVIDER_SITE_OTHER): Payer: Medicare Other | Admitting: Family Medicine

## 2013-04-03 VITALS — BP 126/56 | HR 56 | Temp 97.1°F | Wt 176.8 lb

## 2013-04-03 DIAGNOSIS — L299 Pruritus, unspecified: Secondary | ICD-10-CM

## 2013-04-03 MED ORDER — PREDNISONE 50 MG PO TABS: ORAL_TABLET | ORAL | Status: DC

## 2013-04-03 MED ORDER — HYDROXYZINE HCL 25 MG PO TABS: 25.0000 mg | ORAL_TABLET | Freq: Three times a day (TID) | ORAL | Status: DC | PRN

## 2013-04-03 NOTE — Progress Notes (Signed)
  Subjective:    Patient ID: Kristina Gibbs, female    DOB: 04-May-1946, 67 y.o.   MRN: [id number]  HPI Patient presents with pruritus times one month. Patient states that she's had episodes of full neuritis initially on the left lateral buttock. This progress to the left lateral thigh as well as the arms. Patient denies any new medications, new detergents, new clothing. Nedra Hai she may have had some insect bites but is not sure. No fevers or chills.   Review of Systems  All other systems reviewed and are negative.       Objective:   Physical Exam  Constitutional: She appears well-developed and well-nourished.  HENT:  Head: Normocephalic and atraumatic.  Right Ear: External ear normal.  Left Ear: External ear normal.  Eyes: Conjunctivae are normal. Pupils are equal, round, and reactive to light.  Neck: Normal range of motion.  Cardiovascular: Normal rate and regular rhythm.   Pulmonary/Chest: Effort normal.  Abdominal: Soft.  Musculoskeletal: Normal range of motion.  Neurological: She is alert.  Skin: Skin is warm. No rash noted.          Assessment & Plan:  Will place patient on course of prednisone as well as Atarax. Did not notice any discernible rash on skin exam. Discussed general, infectious, dermatologic red flags including rash, redness and pain. Followup as needed. Consider derm referral if this persists.

## 2013-04-07 ENCOUNTER — Other Ambulatory Visit: Payer: Self-pay | Admitting: Family Medicine

## 2013-04-10 ENCOUNTER — Other Ambulatory Visit: Payer: Self-pay | Admitting: *Deleted

## 2013-04-17 ENCOUNTER — Other Ambulatory Visit: Payer: Self-pay | Admitting: Family Medicine

## 2013-04-17 ENCOUNTER — Ambulatory Visit (INDEPENDENT_AMBULATORY_CARE_PROVIDER_SITE_OTHER): Payer: Medicare Other | Admitting: Family Medicine

## 2013-04-17 ENCOUNTER — Other Ambulatory Visit: Payer: Self-pay | Admitting: *Deleted

## 2013-04-17 ENCOUNTER — Encounter: Payer: Self-pay | Admitting: Family Medicine

## 2013-04-17 VITALS — BP 120/60 | HR 64 | Temp 97.5°F | Wt 173.8 lb

## 2013-04-17 DIAGNOSIS — M8580 Other specified disorders of bone density and structure, unspecified site: Secondary | ICD-10-CM

## 2013-04-17 DIAGNOSIS — D649 Anemia, unspecified: Secondary | ICD-10-CM

## 2013-04-17 DIAGNOSIS — R413 Other amnesia: Secondary | ICD-10-CM | POA: Insufficient documentation

## 2013-04-17 DIAGNOSIS — I2510 Atherosclerotic heart disease of native coronary artery without angina pectoris: Secondary | ICD-10-CM

## 2013-04-17 DIAGNOSIS — K219 Gastro-esophageal reflux disease without esophagitis: Secondary | ICD-10-CM

## 2013-04-17 DIAGNOSIS — E785 Hyperlipidemia, unspecified: Secondary | ICD-10-CM

## 2013-04-17 DIAGNOSIS — M899 Disorder of bone, unspecified: Secondary | ICD-10-CM

## 2013-04-17 DIAGNOSIS — N63 Unspecified lump in unspecified breast: Secondary | ICD-10-CM

## 2013-04-17 DIAGNOSIS — L299 Pruritus, unspecified: Secondary | ICD-10-CM | POA: Insufficient documentation

## 2013-04-17 LAB — POCT CBC
Granulocyte percent: 69.3 %G (ref 37–80)
HCT, POC: 41.5 % (ref 37.7–47.9)
Hemoglobin: 14.3 g/dL (ref 12.2–16.2)
Lymph, poc: 1.5 (ref 0.6–3.4)
MCH, POC: 30.4 pg (ref 27–31.2)
MCHC: 34.5 g/dL (ref 31.8–35.4)
MCV: 88.1 fL (ref 80–97)
MPV: 5.8 fL (ref 0–99.8)
POC Granulocyte: 4.6 (ref 2–6.9)
POC LYMPH PERCENT: 23 %L (ref 10–50)
Platelet Count, POC: 247 10*3/uL (ref 142–424)
RBC: 4.7 M/uL (ref 4.04–5.48)
RDW, POC: 13.6 %
WBC: 6.7 10*3/uL (ref 4.6–10.2)

## 2013-04-17 LAB — COMPLETE METABOLIC PANEL WITH GFR
ALT: 25 U/L (ref 0–35)
AST: 18 U/L (ref 0–37)
Albumin: 4.1 g/dL (ref 3.5–5.2)
Alkaline Phosphatase: 49 U/L (ref 39–117)
BUN: 17 mg/dL (ref 6–23)
CO2: 28 mEq/L (ref 19–32)
Calcium: 9.2 mg/dL (ref 8.4–10.5)
Chloride: 103 mEq/L (ref 96–112)
Creat: 1.13 mg/dL — ABNORMAL HIGH (ref 0.50–1.10)
GFR, Est African American: 58 mL/min — ABNORMAL LOW
GFR, Est Non African American: 50 mL/min — ABNORMAL LOW
Glucose, Bld: 104 mg/dL — ABNORMAL HIGH (ref 70–99)
Potassium: 4.3 mEq/L (ref 3.5–5.3)
Sodium: 137 mEq/L (ref 135–145)
Total Bilirubin: 0.7 mg/dL (ref 0.3–1.2)
Total Protein: 6.4 g/dL (ref 6.0–8.3)

## 2013-04-17 LAB — LIPID PANEL
Cholesterol: 162 mg/dL (ref 0–200)
HDL: 64 mg/dL (ref >39)
LDL Cholesterol: 72 mg/dL (ref 0–99)
Total CHOL/HDL Ratio: 2.5 Ratio
Triglycerides: 130 mg/dL (ref <150)
VLDL: 26 mg/dL (ref 0–40)

## 2013-04-17 NOTE — Patient Instructions (Addendum)
      Dr Woodroe Mode Recommendations  Diet and Exercise discussed with patient.  For nutrition information, I recommend books:  1).Eat to Live by Dr Monico Hoar. 2).Prevent and Reverse Heart Disease by Dr Suzzette Righter. 3) Dr Katherina Right Book:  Program to Reverse Diabetes  Exercise recommendations are:  If unable to walk, then the patient can exercise in a chair 3 times a day. By flapping arms like a bird gently and raising legs outwards to the front.  If ambulatory, the patient can go for walks for 30 minutes 3 times a week. Then increase the intensity and duration as tolerated.  Goal is to try to attain exercise frequency to 5 times a week.  If applicable: Best to perform resistance exercises (machines or weights) 2 days a week and cardio type exercises 3 days per week.  Continue memory games on line Exercise.

## 2013-04-17 NOTE — Progress Notes (Signed)
Patient ID: Kristina Gibbs, female   DOB: [birth year], 67 y.o.   MRN: [id number] SUBJECTIVE: CC: Chief Complaint  Patient presents with  . Follow-up    4 month ck kup fasting still has some itching left hip area and  prednisone did help     HPI: 1)Patient is here for follow up of hyperlipidemia/CAD/GERD.: denies Headache;denies Chest Pain;denies weakness;denies Shortness of Breath and orthopnea;denies Visual changes;denies palpitations;denies cough;denies pedal edema;denies symptoms of TIA or stroke;deniesClaudication symptoms. admits to Compliance with medications; denies Problems with medications.  2) memory: working with an Therapist, sports games to preserve memory.  3) new breast lump. Small pea size in left breast needs checking. Tender recently found.  4) GERD: stable.  Past Medical History  Diagnosis Date  . Hypercholesteremia   . Memory impairment      MMSE 27/30  . Cancer     ovarian  . Arthritis   . Allergy   . Osteopenia    Past Surgical History  Procedure Laterality Date  . Coronary artery bypass graft    . Abdominal hysterectomy     History   Social History  . Marital Status: Married    Spouse Name: N/A    Number of Children: N/A  . Years of Education: N/A   Occupational History  . Not on file.   Social History Main Topics  . Smoking status: Former Games developer  . Smokeless tobacco: Not on file  . Alcohol Use: 0.6 oz/week    1 Glasses of wine per week  . Drug Use: No  . Sexually Active:    Other Topics Concern  . Not on file   Social History Narrative  . No narrative on file   No family history on file. Current Outpatient Prescriptions on File Prior to Visit  Medication Sig Dispense Refill  . alendronate (FOSAMAX) 70 MG tablet TAKE ONE TABLET BY MOUTH EVERY WEEK  4 tablet  1  . aspirin EC 81 MG tablet Take 81 mg by mouth daily.      Marland Kitchen b complex vitamins tablet Take 2 tablets by mouth daily.      . calcium carbonate (OS-CAL) 600 MG TABS Take 600 mg  by mouth daily.      . cetirizine (ZYRTEC) 10 MG tablet Take 10 mg by mouth daily as needed. For allergies.      . cholecalciferol (VITAMIN D) 1000 UNITS tablet Take 1,000 Units by mouth daily.      . clopidogrel (PLAVIX) 75 MG tablet Take 75 mg by mouth daily.      . CO ENZYME Q-10 PO Take 1 tablet by mouth daily.      . hydrOXYzine (ATARAX/VISTARIL) 25 MG tablet Take 1 tablet (25 mg total) by mouth 3 (three) times daily as needed for itching.  30 tablet  0  . irbesartan (AVAPRO) 75 MG tablet Take 1 tablet (75 mg total) by mouth at bedtime.  30 tablet  7  . isosorbide mononitrate (IMDUR) 60 MG 24 hr tablet Take 60 mg by mouth daily.      Marland Kitchen lubiprostone (AMITIZA) 24 MCG capsule Take 24 mcg by mouth 2 (two) times daily with a meal. Prn      . metoprolol tartrate (LOPRESSOR) 25 MG tablet Take 12.5 mg by mouth daily.      . nitroGLYCERIN (NITROLINGUAL) 0.4 MG/SPRAY spray Place 1 spray under the tongue every 5 (five) minutes as needed for chest pain.  12 g  12  . Olopatadine HCl (PATADAY) 0.2 %  SOLN Place 1 drop into both eyes daily.      Marland Kitchen omega-3 acid ethyl esters (LOVAZA) 1 G capsule TAKE 2 CAPSULES (2 G TOTAL) BY MOUTH 2 (TWO) TIMES DAILY.  120 capsule  2  . ranolazine (RANEXA) 500 MG 12 hr tablet Take 2 tablets (1,000 mg total) by mouth 2 (two) times daily.  60 tablet  11  . rosuvastatin (CRESTOR) 10 MG tablet Take 10 mg by mouth daily.      Marland Kitchen triamterene-hydrochlorothiazide (DYAZIDE) 37.5-25 MG per capsule Take 1 capsule by mouth daily as needed. For fluid retention.      . vitamin C (ASCORBIC ACID) 500 MG tablet Take 1,500 mg by mouth daily.      Marland Kitchen esomeprazole (NEXIUM) 40 MG capsule Take 40 mg by mouth daily before breakfast.      . predniSONE (DELTASONE) 50 MG tablet 1 tab daily x 5 days  5 tablet  0   No current facility-administered medications on file prior to visit.   Allergies  Allergen Reactions  . Codeine   . Hydrocortisone     Hydrocortisone cream makes her itch    There is  no immunization history on file for this patient. Prior to Admission medications   Medication Sig Start Date End Date Taking? Authorizing Provider  alendronate (FOSAMAX) 70 MG tablet TAKE ONE TABLET BY MOUTH EVERY WEEK 04/07/13  Yes Mary-Margaret Daphine Deutscher, FNP  aspirin EC 81 MG tablet Take 81 mg by mouth daily.   Yes Historical Provider, MD  b complex vitamins tablet Take 2 tablets by mouth daily.   Yes Historical Provider, MD  calcium carbonate (OS-CAL) 600 MG TABS Take 600 mg by mouth daily.   Yes Historical Provider, MD  cetirizine (ZYRTEC) 10 MG tablet Take 10 mg by mouth daily as needed. For allergies.   Yes Historical Provider, MD  cholecalciferol (VITAMIN D) 1000 UNITS tablet Take 1,000 Units by mouth daily.   Yes Historical Provider, MD  clopidogrel (PLAVIX) 75 MG tablet Take 75 mg by mouth daily.   Yes Historical Provider, MD  CO ENZYME Q-10 PO Take 1 tablet by mouth daily.   Yes Historical Provider, MD  hydrOXYzine (ATARAX/VISTARIL) 25 MG tablet Take 1 tablet (25 mg total) by mouth 3 (three) times daily as needed for itching. 04/03/13  Yes Doree Albee, MD  irbesartan (AVAPRO) 75 MG tablet Take 1 tablet (75 mg total) by mouth at bedtime. 03/01/13  Yes Nada Boozer, NP  isosorbide mononitrate (IMDUR) 60 MG 24 hr tablet Take 60 mg by mouth daily.   Yes Historical Provider, MD  lubiprostone (AMITIZA) 24 MCG capsule Take 24 mcg by mouth 2 (two) times daily with a meal. Prn   Yes Historical Provider, MD  metoprolol tartrate (LOPRESSOR) 25 MG tablet Take 12.5 mg by mouth daily.   Yes Historical Provider, MD  nitroGLYCERIN (NITROLINGUAL) 0.4 MG/SPRAY spray Place 1 spray under the tongue every 5 (five) minutes as needed for chest pain. 06/05/12 06/05/13 Yes Lyanne Co, MD  Olopatadine HCl (PATADAY) 0.2 % SOLN Place 1 drop into both eyes daily.   Yes Historical Provider, MD  omega-3 acid ethyl esters (LOVAZA) 1 G capsule TAKE 2 CAPSULES (2 G TOTAL) BY MOUTH 2 (TWO) TIMES DAILY. 03/26/13  Yes Ileana Ladd, MD  ranolazine (RANEXA) 500 MG 12 hr tablet Take 2 tablets (1,000 mg total) by mouth 2 (two) times daily. 06/05/12  Yes Wilburt Finlay, PA-C  rosuvastatin (CRESTOR) 10 MG tablet Take 10 mg by  mouth daily.   Yes Historical Provider, MD  triamterene-hydrochlorothiazide (DYAZIDE) 37.5-25 MG per capsule Take 1 capsule by mouth daily as needed. For fluid retention.   Yes Historical Provider, MD  vitamin C (ASCORBIC ACID) 500 MG tablet Take 1,500 mg by mouth daily.   Yes Historical Provider, MD  esomeprazole (NEXIUM) 40 MG capsule Take 40 mg by mouth daily before breakfast.    Historical Provider, MD  predniSONE (DELTASONE) 50 MG tablet 1 tab daily x 5 days 04/03/13   Doree Albee, MD    ROS: As above in the HPI. All other systems are stable or negative.   OBJECTIVE: APPEARANCE:  Patient in no acute distress.The patient appeared well nourished and normally developed. Acyanotic. Waist: VITAL SIGNS:BP 120/60  Pulse 64  Temp(Src) 97.5 F (36.4 C) (Oral)  Wt 173 lb 12.8 oz (78.835 kg)  BMI 30.79 kg/m2 WF overweight  SKIN: warm and  Dry without overt rashes, tattoos and scars Breast Exam:  Appearance:  Skin : normal Areolas normal Nipples normal Dimples: Absent Left Breast:Palpation:30mm peas sized smooth lump in the left anterior axillary line. At the 2 o'clock position. Right breast normal  Lymph Nodes:negative Axillary: Normal  HEAD and Neck: without JVD, Head and scalp: normal Eyes:No scleral icterus. Fundi normal, eye movements normal. Ears: Auricle normal, canal normal, Tympanic membranes normal, insufflation normal. Nose: normal Throat: normal Neck & thyroid: normal  CHEST & LUNGS: Chest wall: sternotomy scar Lungs: Clear  CVS: Reveals the PMI to be normally located. Regular rhythm, First and Second Heart sounds are normal,  absence of murmurs, rubs or gallops. Peripheral vasculature: Radial pulses: normal Dorsal pedis pulses: normal Posterior pulses:  normal  ABDOMEN:  Appearance: obesity Benign, no organomegaly, no masses, no Abdominal Aortic enlargement. No Guarding , no rebound. No Bruits. Bowel sounds: normal  RECTAL: N/A GU: N/A  EXTREMETIES: nonedematous. Both Femoral and Pedal pulses are normal.  MUSCULOSKELETAL:  Spine: normal Joints: intact  NEUROLOGIC: oriented to time,place and person; nonfocal. Strength is normal Sensory is normal Reflexes are normal Cranial Nerves are normal.  ASSESSMENT: HLD (hyperlipidemia) - doing well with statin - Plan: COMPLETE METABOLIC PANEL WITH GFR, Lipid panel  CORONARY ARTERY DISEASE - recent follow up with Dr Alanda Amass. stable - Plan: Lipid panel  GERD - stable  Memory impairment  Breast lump in upper outer quadrant - new 3mm smooth lump in left anterior axillary line at 2 o'clock positionnow due for ammmogram. - Plan: Ambulatory referral to Breast Clinic  Pruritus - better  Osteopenia - Plan: Vitamin D 25 hydroxy  Anemia - Plan: POCT CBC  PLAN:  Orders Placed This Encounter  Procedures  . COMPLETE METABOLIC PANEL WITH GFR  . Lipid panel  . Vitamin D 25 hydroxy  . Ambulatory referral to Breast Clinic    Referral Priority:  Routine    Referral Type:  Consultation    Referral Reason:  Specialty Services Required    Requested Specialty:  Breast Surgery    Number of Visits Requested:  1  . POCT CBC   Discussed with patient the evaluation for breast lumps.  Await labs No change in medications  Return in about 3 months (around 07/18/2013) for Recheck medical problems. Plan on another MMSE at that visit.  Jimya Ciani P. [place]esto Charon, M.D.

## 2013-04-18 ENCOUNTER — Ambulatory Visit (HOSPITAL_COMMUNITY)
Admission: RE | Admit: 2013-04-18 | Discharge: 2013-04-18 | Disposition: A | Discharge status: Home / Self Care | Payer: Medicare Other | Source: Ambulatory Visit | Attending: Cardiovascular Disease | Admitting: Cardiovascular Disease

## 2013-04-18 DIAGNOSIS — I6529 Occlusion and stenosis of unspecified carotid artery: Secondary | ICD-10-CM | POA: Insufficient documentation

## 2013-04-18 LAB — VITAMIN D 25 HYDROXY (VIT D DEFICIENCY, FRACTURES): Vit D, 25-Hydroxy: 56 ng/mL (ref 30–89)

## 2013-04-18 NOTE — Progress Notes (Signed)
Carotid Duplex Completed. °Kristina Gibbs ° °

## 2013-04-20 NOTE — Progress Notes (Signed)
Quick Note:  Lab result at goal. The kidney function/creatinine is a little higher. Will recheck in 3 months. No change in Medications for now. No Change in plans and follow up. ______

## 2013-04-21 ENCOUNTER — Encounter: Payer: Self-pay | Admitting: Cardiovascular Disease

## 2013-04-23 ENCOUNTER — Telehealth: Payer: Self-pay | Admitting: Family Medicine

## 2013-04-23 NOTE — Telephone Encounter (Signed)
Pt aware.

## 2013-04-27 ENCOUNTER — Ambulatory Visit
Admission: RE | Admit: 2013-04-27 | Discharge: 2013-04-27 | Disposition: A | Discharge status: Home / Self Care | Payer: Medicare Other | Source: Ambulatory Visit | Attending: Family Medicine | Admitting: Family Medicine

## 2013-04-27 ENCOUNTER — Other Ambulatory Visit: Payer: Self-pay | Admitting: Family Medicine

## 2013-04-27 DIAGNOSIS — N63 Unspecified lump in unspecified breast: Secondary | ICD-10-CM

## 2013-05-07 ENCOUNTER — Encounter: Payer: Self-pay | Admitting: *Deleted

## 2013-06-02 ENCOUNTER — Other Ambulatory Visit: Payer: Self-pay | Admitting: Nurse Practitioner

## 2013-06-18 ENCOUNTER — Telehealth: Payer: Self-pay | Admitting: Cardiovascular Disease

## 2013-06-18 NOTE — Telephone Encounter (Signed)
Returned call.  Message left that hardy copy request be faxed to [phone] for refills.

## 2013-06-18 NOTE — Telephone Encounter (Signed)
Pharmacist said they have been trying to get her Metoprolol.

## 2013-06-18 NOTE — Telephone Encounter (Signed)
Need refill on Metoprolol XL 25 mg #30-pt needs her medicine today.

## 2013-06-19 ENCOUNTER — Other Ambulatory Visit: Payer: Self-pay | Admitting: Family Medicine

## 2013-06-21 ENCOUNTER — Telehealth: Payer: Self-pay | Admitting: Cardiovascular Disease

## 2013-06-21 NOTE — Telephone Encounter (Signed)
Pharmacy did not receive the refill on Metoprolol.  Requested this be refilled along w/Pantoprazole 40mg  QD.  Rx called in #30 w/6 refills.

## 2013-06-21 NOTE — Telephone Encounter (Signed)
Patient has questions about her medications.

## 2013-07-25 ENCOUNTER — Ambulatory Visit: Payer: Medicare Other | Admitting: Family Medicine

## 2013-07-30 ENCOUNTER — Ambulatory Visit (INDEPENDENT_AMBULATORY_CARE_PROVIDER_SITE_OTHER): Payer: Medicare Other | Admitting: Family Medicine

## 2013-07-30 ENCOUNTER — Encounter: Payer: Self-pay | Admitting: Family Medicine

## 2013-07-30 VITALS — BP 143/54 | HR 62 | Temp 97.8°F | Ht 63.0 in | Wt 174.0 lb

## 2013-07-30 DIAGNOSIS — K219 Gastro-esophageal reflux disease without esophagitis: Secondary | ICD-10-CM

## 2013-07-30 DIAGNOSIS — R413 Other amnesia: Secondary | ICD-10-CM

## 2013-07-30 DIAGNOSIS — E785 Hyperlipidemia, unspecified: Secondary | ICD-10-CM

## 2013-07-30 DIAGNOSIS — I2510 Atherosclerotic heart disease of native coronary artery without angina pectoris: Secondary | ICD-10-CM

## 2013-07-30 DIAGNOSIS — Z23 Encounter for immunization: Secondary | ICD-10-CM

## 2013-07-30 DIAGNOSIS — N63 Unspecified lump in unspecified breast: Secondary | ICD-10-CM

## 2013-07-30 NOTE — Patient Instructions (Signed)
Dr Woodroe Mode Recommendations  For nutrition information, I recommend books:  1).Eat to Live by Dr Monico Hoar. 2).Prevent and Reverse Heart Disease by Dr Suzzette Righter. 3) Dr Katherina Right Book:  Program to Reverse Diabetes  Exercise recommendations are:  If unable to walk, then the patient can exercise in a chair 3 times a day. By flapping arms like a bird gently and raising legs outwards to the front.  If ambulatory, the patient can go for walks for 30 minutes 3 times a week. Then increase the intensity and duration as tolerated.  Goal is to try to attain exercise frequency to 5 times a week.  If applicable: Best to perform resistance exercises (machines or weights) 2 days a week and cardio type exercises 3 days per week.   Angina Pectoris Angina pectoris, often just called angina, is extreme discomfort in your chest, neck, or arm caused by a lack of blood in the middle and thickest layer of your heart wall (myocardium). It may feel like tightness or heavy pressure. It may feel like a crushing or squeezing pain. Some people say it feels like gas or indigestion. It may go down your shoulders, back, and arms. Some people may have symptoms other than pain. These symptoms include fatigue, shortness of breath, cold sweats, or nausea. There are four different types of angina:  Stable angina Stable angina usually occurs in episodes of predictable frequency and duration. It usually is brought on by physical activity, emotional stress, or excitement. These are all times when the myocardium needs more oxygen. Stable angina usually lasts a few minutes and often is relieved by taking a medicine that can be taken under your tongue (sublingually). The medicine is called nitroglycerin. Stable angina is caused by a buildup of plaque inside the arteries, which restricts blood flow to the heart muscle (atherosclerosis).  Unstable angina Unstable angina can occur even when your body  experiences little or no physical exertion. It can occur during sleep. It can also occur at rest. It can suddenly increase in severity or frequency. It might not be relieved by sublingual nitroglycerin. It can last up to 30 minutes. The most common cause of unstable angina is a blood clot that has developed on the top of plaque buildup inside a coronary artery. It can lead to a heart attack if the blood clot completely blocks the artery.  Microvascular angina This type of angina is caused by a disorder of tiny blood vessels called arterioles. Microvascular angina is more common in women. The pain may be more severe and last longer than other types of angina pectoris.  Prinzmetal or variant angina This type of angina pectoris usually occurs when your body experiences little or no physical exertion. It especially occurs in the early morning hours. It is caused by a spasm of your coronary artery. HOME CARE INSTRUCTIONS   Only take over-the-counter and prescription medicines as directed by your caregiver.  Stay active or increase your exercise as directed by your caregiver.  Limit strenuous activity as directed by your caregiver.  Limit heavy lifting as directed by your caregiver.  Maintain a healthy weight.  Learn about and eat heart-healthy foods.  Do not smoke. SEEK IMMEDIATE MEDICAL CARE IF:  You experience the following symptoms:  Chest, neck, deep shoulder, or arm pain or discomfort that lasts more than a few minutes.  Chest, neck, deep shoulder, or arm pain or discomfort that goes away and comes back, repeatedly.  Heavy sweating  with discomfort, without a noticeable cause.  Shortness of breath or difficulty breathing.  Angina that does not get better after a few minutes of rest or after taking sublingual nitroglycerin. These can all be symptoms of a heart attack, which is a medical emergency! Get medical help at once. Call your local emergency service (911 in [place] immediately. Do  not  drive yourself to the hospital and do not  wait to for your symptoms to go away. MAKE SURE YOU:  Understand these instructions.  Will watch your condition.  Will get help right away if you are not doing well or get worse. Document Released: 09/20/2005 Document Revised: 09/06/2012 Document Reviewed: 06/29/2012 [facility] Patient Information 2014 [place], [place].

## 2013-07-30 NOTE — Progress Notes (Signed)
Patient ID: Kristina Gibbs, female   DOB: 1946/04/25, 67 y.o.   MRN: [id number] SUBJECTIVE: CC: Chief Complaint  Patient presents with  . Follow-up    3 MONTH     HPI:  Patient is here for follow up of hyperlipidemia/CAD/GERD/ denies Headache; Chest Pain: had an episode the other day of chest pain resolved with her husband's nitro spray. Was straightening up the bed. ;denies weakness;denies Shortness of Breath and orthopnea;denies Visual changes;denies palpitations;denies cough;denies pedal edema;denies symptoms of TIA or stroke;deniesClaudication symptoms. admits to Compliance with medications; denies Problems with medications.  Memory impairment.  Past Medical History  Diagnosis Date  . Hypercholesteremia   . Memory impairment      MMSE 27/30  . Cancer     ovarian  . Arthritis   . Allergy   . Osteopenia    Past Surgical History  Procedure Laterality Date  . Coronary artery bypass graft    . Abdominal hysterectomy     History   Social History  . Marital Status: Married    Spouse Name: N/A    Number of Children: N/A  . Years of Education: N/A   Occupational History  . Not on file.   Social History Main Topics  . Smoking status: Former Games developer  . Smokeless tobacco: Not on file  . Alcohol Use: 0.6 oz/week    1 Glasses of wine per week  . Drug Use: No  . Sexual Activity:    Other Topics Concern  . Not on file   Social History Narrative  . No narrative on file   No family history on file. Current Outpatient Prescriptions on File Prior to Visit  Medication Sig Dispense Refill  . alendronate (FOSAMAX) 70 MG tablet TAKE ONE TABLET BY MOUTH EVERY WEEK  4 tablet  4  . aspirin EC 81 MG tablet Take 81 mg by mouth daily.      Marland Kitchen b complex vitamins tablet Take 2 tablets by mouth daily.      . calcium carbonate (OS-CAL) 600 MG TABS Take 600 mg by mouth daily.      . cetirizine (ZYRTEC) 10 MG tablet Take 10 mg by mouth daily as needed. For allergies.      .  cholecalciferol (VITAMIN D) 1000 UNITS tablet Take 1,000 Units by mouth daily.      . clopidogrel (PLAVIX) 75 MG tablet Take 75 mg by mouth daily.      . CO ENZYME Q-10 PO Take 1 tablet by mouth daily.      Marland Kitchen esomeprazole (NEXIUM) 40 MG capsule Take 40 mg by mouth daily before breakfast.      . hydrOXYzine (ATARAX/VISTARIL) 25 MG tablet Take 1 tablet (25 mg total) by mouth 3 (three) times daily as needed for itching.  30 tablet  0  . irbesartan (AVAPRO) 75 MG tablet Take 1 tablet (75 mg total) by mouth at bedtime.  30 tablet  7  . isosorbide mononitrate (IMDUR) 60 MG 24 hr tablet Take 60 mg by mouth daily.      Marland Kitchen LOVAZA 1 G capsule TAKE 2 CAPSULES (2 G TOTAL) BY MOUTH 2 (TWO) TIMES DAILY.  120 capsule  1  . lubiprostone (AMITIZA) 24 MCG capsule Take 24 mcg by mouth 2 (two) times daily with a meal. Prn      . metoprolol tartrate (LOPRESSOR) 25 MG tablet Take 12.5 mg by mouth daily.      . Olopatadine HCl (PATADAY) 0.2 % SOLN Place 1 drop into  both eyes daily.      . ranolazine (RANEXA) 500 MG 12 hr tablet Take 2 tablets (1,000 mg total) by mouth 2 (two) times daily.  60 tablet  11  . rosuvastatin (CRESTOR) 10 MG tablet Take 10 mg by mouth daily.      Marland Kitchen triamterene-hydrochlorothiazide (DYAZIDE) 37.5-25 MG per capsule Take 1 capsule by mouth daily as needed. For fluid retention.      . vitamin C (ASCORBIC ACID) 500 MG tablet Take 1,500 mg by mouth daily.      . nitroGLYCERIN (NITROLINGUAL) 0.4 MG/SPRAY spray Place 1 spray under the tongue every 5 (five) minutes as needed for chest pain.  12 g  12   No current facility-administered medications on file prior to visit.   Allergies  Allergen Reactions  . Codeine   . Hydrocortisone     Hydrocortisone cream makes her itch   Immunization History  Administered Date(s) Administered  . Influenza,inj,Quad PF,36+ Mos 07/30/2013   Prior to Admission medications   Medication Sig Start Date End Date Taking? Authorizing Provider  alendronate (FOSAMAX) 70  MG tablet TAKE ONE TABLET BY MOUTH EVERY WEEK 06/02/13  Yes Ileana Ladd, MD  aspirin EC 81 MG tablet Take 81 mg by mouth daily.   Yes Historical Provider, MD  b complex vitamins tablet Take 2 tablets by mouth daily.   Yes Historical Provider, MD  calcium carbonate (OS-CAL) 600 MG TABS Take 600 mg by mouth daily.   Yes Historical Provider, MD  cetirizine (ZYRTEC) 10 MG tablet Take 10 mg by mouth daily as needed. For allergies.   Yes Historical Provider, MD  cholecalciferol (VITAMIN D) 1000 UNITS tablet Take 1,000 Units by mouth daily.   Yes Historical Provider, MD  clopidogrel (PLAVIX) 75 MG tablet Take 75 mg by mouth daily.   Yes Historical Provider, MD  CO ENZYME Q-10 PO Take 1 tablet by mouth daily.   Yes Historical Provider, MD  esomeprazole (NEXIUM) 40 MG capsule Take 40 mg by mouth daily before breakfast.   Yes Historical Provider, MD  hydrOXYzine (ATARAX/VISTARIL) 25 MG tablet Take 1 tablet (25 mg total) by mouth 3 (three) times daily as needed for itching. 04/03/13  Yes Doree Albee, MD  irbesartan (AVAPRO) 75 MG tablet Take 1 tablet (75 mg total) by mouth at bedtime. 03/01/13  Yes Nada Boozer, NP  isosorbide mononitrate (IMDUR) 60 MG 24 hr tablet Take 60 mg by mouth daily.   Yes Historical Provider, MD  LOVAZA 1 G capsule TAKE 2 CAPSULES (2 G TOTAL) BY MOUTH 2 (TWO) TIMES DAILY. 06/19/13  Yes Ileana Ladd, MD  lubiprostone (AMITIZA) 24 MCG capsule Take 24 mcg by mouth 2 (two) times daily with a meal. Prn   Yes Historical Provider, MD  metoprolol tartrate (LOPRESSOR) 25 MG tablet Take 12.5 mg by mouth daily.   Yes Historical Provider, MD  Olopatadine HCl (PATADAY) 0.2 % SOLN Place 1 drop into both eyes daily.   Yes Historical Provider, MD  ranolazine (RANEXA) 500 MG 12 hr tablet Take 2 tablets (1,000 mg total) by mouth 2 (two) times daily. 06/05/12  Yes Wilburt Finlay, PA-C  rosuvastatin (CRESTOR) 10 MG tablet Take 10 mg by mouth daily.   Yes Historical Provider, MD   triamterene-hydrochlorothiazide (DYAZIDE) 37.5-25 MG per capsule Take 1 capsule by mouth daily as needed. For fluid retention.   Yes Historical Provider, MD  vitamin C (ASCORBIC ACID) 500 MG tablet Take 1,500 mg by mouth daily.   Yes Historical  Provider, MD  nitroGLYCERIN (NITROLINGUAL) 0.4 MG/SPRAY spray Place 1 spray under the tongue every 5 (five) minutes as needed for chest pain. 06/05/12 06/05/13  Lyanne Co, MD     ROS: As above in the HPI. All other systems are stable or negative.  OBJECTIVE: APPEARANCE:  Patient in no acute distress.The patient appeared well nourished and normally developed. Acyanotic. Waist: VITAL SIGNS:BP 143/54  Pulse 62  Temp(Src) 97.8 F (36.6 C) (Oral)  Ht 5\' 3"  (1.6 m)  Wt 174 lb (78.926 kg)  BMI 30.83 kg/m2 WF mildly Obese  SKIN: warm and  Dry without overt rashes, tattoos and scars  HEAD and Neck: without JVD, Head and scalp: normal Eyes:No scleral icterus. Fundi normal, eye movements normal. Ears: Auricle normal, canal normal, Tympanic membranes normal, insufflation normal. Nose: normal Throat: normal Neck & thyroid: normal  CHEST & LUNGS: Chest wall: normal Lungs: Clear  CVS: Reveals the PMI to be normally located. Regular rhythm, First and Second Heart sounds are normal,  absence of murmurs, rubs or gallops. Peripheral vasculature: Radial pulses: normal Dorsal pedis pulses: normal Posterior pulses: normal  ABDOMEN:  Appearance: normal Benign, no organomegaly, no masses, no Abdominal Aortic enlargement. No Guarding , no rebound. No Bruits. Bowel sounds: normal  RECTAL: N/A GU: N/A  EXTREMETIES: nonedematous.  MUSCULOSKELETAL:  Spine: normal Joints: intact  NEUROLOGIC: oriented to time,place and person; nonfocal. Strength is normal Sensory is normal Reflexes are normal Cranial Nerves are normal.  ASSESSMENT: GERD  CORONARY ARTERY DISEASE - Plan: CMP14+EGFR, EKG 12-Lead, Ambulatory referral to Cardiology  HLD  (hyperlipidemia) - Plan: CMP14+EGFR, Lipid panel  Memory impairment  Breast lump in upper outer quadrant  Need for prophylactic vaccination and inoculation against influenza  PLAN: Reviewed her breast U/S and mammogram reports with the patient.  Orders Placed This Encounter  Procedures  . CMP14+EGFR  . Lipid panel  . Ambulatory referral to Cardiology    Referral Priority:  Routine    Referral Type:  Consultation    Referral Reason:  Specialty Services Required    Requested Specialty:  Cardiology    Number of Visits Requested:  1  . EKG 12-Lead   No orders of the defined types were placed in this encounter.   Medications Discontinued During This Encounter  Medication Reason  . predniSONE (DELTASONE) 50 MG tablet Completed Course   EKG : no change from previous. Discussed that she needs to have her cardiac status re-evaluated in the next few days. Have her NTG nearby. Call [phone] if chest pain occur.       Dr Woodroe Mode Recommendations  For nutrition information, I recommend books:  1).Eat to Live by Dr Monico Hoar. 2).Prevent and Reverse Heart Disease by Dr Suzzette Righter. 3) Dr Katherina Right Book:  Program to Reverse Diabetes  Exercise recommendations are:  If unable to walk, then the patient can exercise in a chair 3 times a day. By flapping arms like a bird gently and raising legs outwards to the front.  If ambulatory, the patient can go for walks for 30 minutes 3 times a week. Then increase the intensity and duration as tolerated.  Goal is to try to attain exercise frequency to 5 times a week.  If applicable: Best to perform resistance exercises (machines or weights) 2 days a week and cardio type exercises 3 days per week.  Handout and  Discussed angina. Return in about 3 months (around 10/30/2013) for Recheck medical problems.  Corin Formisano P. Modesto Charon, M.D.

## 2013-07-31 LAB — CMP14+EGFR
ALT: 27 IU/L (ref 0–32)
AST: 21 IU/L (ref 0–40)
Albumin/Globulin Ratio: 1.7 (ref 1.1–2.5)
Albumin: 4.3 g/dL (ref 3.6–4.8)
Alkaline Phosphatase: 79 IU/L (ref 39–117)
BUN/Creatinine Ratio: 14 (ref 11–26)
BUN: 16 mg/dL (ref 8–27)
CO2: 25 mmol/L (ref 18–29)
Calcium: 9.6 mg/dL (ref 8.6–10.2)
Chloride: 101 mmol/L (ref 97–108)
Creatinine, Ser: 1.12 mg/dL — ABNORMAL HIGH (ref 0.57–1.00)
GFR calc Af Amer: 58 mL/min/{1.73_m2} — ABNORMAL LOW (ref >59)
GFR calc non Af Amer: 51 mL/min/{1.73_m2} — ABNORMAL LOW (ref >59)
Globulin, Total: 2.6 g/dL (ref 1.5–4.5)
Glucose: 102 mg/dL — ABNORMAL HIGH (ref 65–99)
Potassium: 5 mmol/L (ref 3.5–5.2)
Sodium: 141 mmol/L (ref 134–144)
Total Bilirubin: 0.5 mg/dL (ref 0.0–1.2)
Total Protein: 6.9 g/dL (ref 6.0–8.5)

## 2013-07-31 LAB — LIPID PANEL
Chol/HDL Ratio: 3 ratio units (ref 0.0–4.4)
Cholesterol, Total: 175 mg/dL (ref 100–199)
HDL: 58 mg/dL (ref >39)
LDL Calculated: 80 mg/dL (ref 0–99)
Triglycerides: 185 mg/dL — ABNORMAL HIGH (ref 0–149)
VLDL Cholesterol Cal: 37 mg/dL (ref 5–40)

## 2013-08-06 ENCOUNTER — Encounter: Payer: Self-pay | Admitting: Cardiovascular Disease

## 2013-08-06 ENCOUNTER — Ambulatory Visit (INDEPENDENT_AMBULATORY_CARE_PROVIDER_SITE_OTHER): Payer: Medicare Other | Admitting: Cardiovascular Disease

## 2013-08-06 VITALS — BP 180/76 | HR 58 | Ht 63.0 in | Wt 174.0 lb

## 2013-08-06 DIAGNOSIS — I10 Essential (primary) hypertension: Secondary | ICD-10-CM

## 2013-08-06 DIAGNOSIS — I2510 Atherosclerotic heart disease of native coronary artery without angina pectoris: Secondary | ICD-10-CM

## 2013-08-06 DIAGNOSIS — R079 Chest pain, unspecified: Secondary | ICD-10-CM

## 2013-08-06 NOTE — Progress Notes (Signed)
08/06/2013 Kristina Gibbs   1946/06/22  [id number]  Primary Physician Redmond Baseman, MD of [facility] Primary Cardiologist: Dr. Allyson Sabal  HPI:    67 year old female with a history of CAD since 1995, also with hypertension, hyperlipidemia presening for follow up of these chronic diseases as well as with complaints of chest pain.  Patient is sent by her primary care doctor for concern over recent chest pain. Patient describes 2 episodes of chest pain lasting less than 5 minutes relieved within a minute of taking nitroglycerin in the last 4 months. This was similar to an episode of pain she reported to Dr. Alanda Amass in July of this year. At that point, decision was made not to pursue a stress test.  In both cases, patient states she either overexerted herself or was stressed (first time after mopping and second time while moving to a new home). She had mild pain to her left neck but was otherwise asymptomatic. Patient states she was not worried about these episodes and states they were typical of pain she has had in the past. She was thankful to have nitroglycerin on hand. She also states that her Ranexa was reduced to 500 mg BID from 1g BID within the last year.  ROS-patient denies shortness of breath with these episodes or outside of these episodes. No diaphoresis.   History: Patient required a multivessel CABG by Dr. Dorris Fetch in March 2010 after being found to have 70% occlusion of left main. Subsequent cath in December 2010 by Dr. Allyson Sabal showed occluded vein grafts, patent LIMA to LAD, POBA site to LAD patent, 60% main left, 30% ISR of the right and 50% bifurcation disease of the RCA distally. She had a Myoview in June 2013 which was low risk for significant ischemia. She has been managed medically since her CABG. Please see office note from 7.8.14 by Dr. Alanda Amass for further details.      Current Outpatient Prescriptions  Medication Sig Dispense Refill   . alendronate (FOSAMAX) 70 MG tablet TAKE ONE TABLET BY MOUTH EVERY WEEK  4 tablet  4  . aspirin EC 81 MG tablet Take 81 mg by mouth daily.      Marland Kitchen b complex vitamins tablet Take 2 tablets by mouth daily.      . calcium carbonate (OS-CAL) 600 MG TABS Take 600 mg by mouth daily.      . cholecalciferol (VITAMIN D) 1000 UNITS tablet Take 1,000 Units by mouth daily.      . clopidogrel (PLAVIX) 75 MG tablet Take 75 mg by mouth daily.      . CO ENZYME Q-10 PO Take 1 tablet by mouth daily.      . irbesartan (AVAPRO) 75 MG tablet Take 1 tablet (75 mg total) by mouth at bedtime.  30 tablet  7  . isosorbide mononitrate (IMDUR) 60 MG 24 hr tablet Take 60 mg by mouth daily.      Marland Kitchen LOVAZA 1 G capsule TAKE 2 CAPSULES (2 G TOTAL) BY MOUTH 2 (TWO) TIMES DAILY.  120 capsule  1  . lubiprostone (AMITIZA) 24 MCG capsule Take 24 mcg by mouth 2 (two) times daily with a meal. Prn      . metoprolol tartrate (LOPRESSOR) 25 MG tablet Take 12.5 mg by mouth daily.      . nitroGLYCERIN (NITROLINGUAL) 0.4 MG/SPRAY spray Place 1 spray under the tongue every 5 (five) minutes as needed for chest pain.      Marland Kitchen  pantoprazole (PROTONIX) 40 MG tablet Take 40 mg by mouth daily.      . ranolazine (RANEXA) 500 MG 12 hr tablet Take 2 tablets (1,000 mg total) by mouth 2 (two) times daily.  60 tablet  11  . rosuvastatin (CRESTOR) 20 MG tablet Take 20 mg by mouth daily.      Marland Kitchen triamterene-hydrochlorothiazide (DYAZIDE) 37.5-25 MG per capsule Take 1 capsule by mouth daily as needed. For fluid retention.      . vitamin C (ASCORBIC ACID) 500 MG tablet Take 1,500 mg by mouth daily.       No current facility-administered medications for this visit.    Allergies  Allergen Reactions  . Codeine   . Hydrocortisone     Hydrocortisone cream makes her itch    History   Social History  . Marital Status: Married    Spouse Name: N/A    Number of Children: N/A  . Years of Education: N/A   Occupational History  . Not on file.   Social  History Main Topics  . Smoking status: Former Games developer  . Smokeless tobacco: Not on file  . Alcohol Use: 0.6 oz/week    1 Glasses of wine per week  . Drug Use: No  . Sexual Activity:    Other Topics Concern  . Not on file   Social History Narrative  . No narrative on file     Blood pressure 180/76, pulse 58, height 5\' 3"  (1.6 m), weight 174 lb (78.926 kg).  General appearance: alert and cooperative Neck: no carotid bruit Lungs: clear to auscultation bilaterally Heart: regular rate and rhythm, S1, S2 normal, no murmur, click, rub or gallop Extremities: edema 1+ pitting bilaterally Skin: Skin color, texture, turgor normal. No rashes or lesions  EKG    Date: 08/06/2013  EKG Time: 4:24 PM  Rate: 58  Rhythm: sinus bradycardia,  unchanged from previous tracings  Axis: normal  Intervals:normal  ST&T Change: poor R wave progression v1, v3, q wave in v1    ASSESSMENT AND PLAN:   Stable Angina Patient presents with stable angina well controlled by nitroglycerin therapy.  Low Risk stress myoview in June 2013. Do not believe we need to pursue additional stress imaging as this chest pain is similar to previous and controlled with nitroglycerin.  Patient will follow up with PA in 6 months and Dr. Allyson Sabal in 12 months. Decrease in Ranexa dosing could be contributing to several episodes of chest pain within last 6 months.   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 In previous visits this was well controlled. Considered increase in avapro but given 1x elevation and stress of difficult drive to office, will follow up with PCP and on subsequent enocounters. Patient currently asymptomatic.   Hyperlipidemia LDL 72 in July of this year. Continue Crestor at current dose.     Dr. Allyson Sabal  has seen and evaluated the patient. We have discussed the history, exam, assessment, and plan as noted above. He agrees with management.   Aldine Contes. Marti Sleigh, MD, PGY3 [facility] 08/06/2013 4:23 PM    Review of Systems: General: negative for chills, fever, night sweats or weight changes.  Cardiovascular: negative for chest pain, dyspnea on exertion, edema, orthopnea, palpitations, paroxysmal nocturnal dyspnea or shortness of breath Dermatological: negative for rash  Respiratory: negative for cough or wheezing Urologic: negative for hematuria Abdominal: negative for nausea, vomiting, diarrhea, bright red blood per rectum, melena, or hematemesis Neurologic: negative for visual changes, syncope, or dizziness All other systems reviewed and are otherwise negative except as noted above.

## 2013-08-06 NOTE — Patient Instructions (Signed)
Your physician recommends that you schedule a follow-up appointment in: 6 months PA   Your physician recommends that you schedule a follow-up appointment in: 1 Year Dr Allyson Sabal

## 2013-08-07 ENCOUNTER — Telehealth: Payer: Self-pay | Admitting: Family Medicine

## 2013-08-08 NOTE — Telephone Encounter (Signed)
Pt aware of labs  

## 2013-08-08 NOTE — Telephone Encounter (Signed)
Pt aware of labs and copy forwarded to Dr Allyson Sabal

## 2013-08-25 ENCOUNTER — Other Ambulatory Visit: Payer: Self-pay | Admitting: Family Medicine

## 2013-08-25 ENCOUNTER — Other Ambulatory Visit: Payer: Self-pay | Admitting: Cardiovascular Disease

## 2013-08-27 NOTE — Telephone Encounter (Signed)
Rx was sent to pharmacy electronically. 

## 2013-09-04 ENCOUNTER — Other Ambulatory Visit: Payer: Self-pay | Admitting: Cardiovascular Disease

## 2013-09-04 ENCOUNTER — Other Ambulatory Visit: Payer: Self-pay | Admitting: *Deleted

## 2013-09-04 MED ORDER — RANOLAZINE ER 500 MG PO TB12: 1000.0000 mg | ORAL_TABLET | Freq: Two times a day (BID) | ORAL | Status: DC

## 2013-09-13 ENCOUNTER — Other Ambulatory Visit: Payer: Self-pay | Admitting: *Deleted

## 2013-09-13 MED ORDER — RANOLAZINE ER 500 MG PO TB12: 1000.0000 mg | ORAL_TABLET | Freq: Two times a day (BID) | ORAL | Status: DC

## 2013-09-24 ENCOUNTER — Other Ambulatory Visit: Payer: Self-pay | Admitting: Family Medicine

## 2013-09-24 DIAGNOSIS — N63 Unspecified lump in unspecified breast: Secondary | ICD-10-CM

## 2013-10-01 ENCOUNTER — Telehealth: Payer: Self-pay | Admitting: Cardiovascular Disease

## 2013-10-01 ENCOUNTER — Other Ambulatory Visit: Payer: Self-pay | Admitting: Cardiovascular Disease

## 2013-10-01 NOTE — Telephone Encounter (Signed)
Refills sent to pharmacy - see refill encounter.

## 2013-10-01 NOTE — Telephone Encounter (Signed)
Rx was sent to pharmacy electronically. 

## 2013-10-01 NOTE — Telephone Encounter (Signed)
Need a refill on Crestor 20 mg

## 2013-10-02 ENCOUNTER — Other Ambulatory Visit: Payer: Self-pay | Admitting: Cardiovascular Disease

## 2013-10-02 NOTE — Telephone Encounter (Signed)
Rx was sent to pharmacy electronically. 

## 2013-10-12 ENCOUNTER — Other Ambulatory Visit: Payer: Self-pay | Admitting: Family Medicine

## 2013-10-12 ENCOUNTER — Other Ambulatory Visit: Payer: Self-pay

## 2013-10-12 DIAGNOSIS — N63 Unspecified lump in unspecified breast: Secondary | ICD-10-CM

## 2013-10-16 ENCOUNTER — Ambulatory Visit
Admission: RE | Admit: 2013-10-16 | Discharge: 2013-10-16 | Disposition: A | Discharge status: Home / Self Care | Payer: Medicare Other | Source: Ambulatory Visit | Attending: Family Medicine | Admitting: Family Medicine

## 2013-10-16 DIAGNOSIS — N63 Unspecified lump in unspecified breast: Secondary | ICD-10-CM

## 2013-10-25 ENCOUNTER — Other Ambulatory Visit: Payer: Self-pay | Admitting: *Deleted

## 2013-10-25 MED ORDER — NITROGLYCERIN 0.4 MG/SPRAY TL SOLN: 1.0000 | Status: DC | PRN

## 2013-10-28 ENCOUNTER — Other Ambulatory Visit: Payer: Self-pay | Admitting: Cardiology

## 2013-10-29 NOTE — Telephone Encounter (Signed)
Rx was sent to pharmacy electronically. 

## 2013-11-05 ENCOUNTER — Telehealth: Payer: Self-pay | Admitting: Family Medicine

## 2013-11-05 ENCOUNTER — Ambulatory Visit: Payer: Medicare Other | Admitting: Family Medicine

## 2013-11-05 ENCOUNTER — Other Ambulatory Visit: Payer: Self-pay | Admitting: Family Medicine

## 2013-11-05 DIAGNOSIS — I2510 Atherosclerotic heart disease of native coronary artery without angina pectoris: Secondary | ICD-10-CM

## 2013-11-05 DIAGNOSIS — E785 Hyperlipidemia, unspecified: Secondary | ICD-10-CM

## 2013-11-05 NOTE — Telephone Encounter (Signed)
Pt aware labs ordered for next week

## 2013-11-05 NOTE — Telephone Encounter (Signed)
Call patient : Labs ordered in EPIC.

## 2013-11-08 ENCOUNTER — Other Ambulatory Visit (INDEPENDENT_AMBULATORY_CARE_PROVIDER_SITE_OTHER): Payer: Medicare Other

## 2013-11-08 DIAGNOSIS — E785 Hyperlipidemia, unspecified: Secondary | ICD-10-CM

## 2013-11-08 DIAGNOSIS — I2510 Atherosclerotic heart disease of native coronary artery without angina pectoris: Secondary | ICD-10-CM

## 2013-11-08 NOTE — Progress Notes (Signed)
Pt came in for labs only 

## 2013-11-09 LAB — CMP14+EGFR
ALT: 20 IU/L (ref 0–32)
AST: 20 IU/L (ref 0–40)
Albumin/Globulin Ratio: 1.7 (ref 1.1–2.5)
Albumin: 4.3 g/dL (ref 3.6–4.8)
Alkaline Phosphatase: 59 IU/L (ref 39–117)
BUN/Creatinine Ratio: 14 (ref 11–26)
BUN: 15 mg/dL (ref 8–27)
CO2: 24 mmol/L (ref 18–29)
Calcium: 9.3 mg/dL (ref 8.7–10.3)
Chloride: 103 mmol/L (ref 97–108)
Creatinine, Ser: 1.1 mg/dL — ABNORMAL HIGH (ref 0.57–1.00)
GFR calc Af Amer: 59 mL/min/{1.73_m2} — ABNORMAL LOW (ref >59)
GFR calc non Af Amer: 51 mL/min/{1.73_m2} — ABNORMAL LOW (ref >59)
Globulin, Total: 2.5 g/dL (ref 1.5–4.5)
Glucose: 98 mg/dL (ref 65–99)
Potassium: 4.9 mmol/L (ref 3.5–5.2)
Sodium: 143 mmol/L (ref 134–144)
Total Bilirubin: 0.5 mg/dL (ref 0.0–1.2)
Total Protein: 6.8 g/dL (ref 6.0–8.5)

## 2013-11-09 LAB — LIPID PANEL
Chol/HDL Ratio: 2.3 ratio units (ref 0.0–4.4)
Cholesterol, Total: 172 mg/dL (ref 100–199)
HDL: 76 mg/dL (ref >39)
LDL Calculated: 76 mg/dL (ref 0–99)
Triglycerides: 100 mg/dL (ref 0–149)
VLDL Cholesterol Cal: 20 mg/dL (ref 5–40)

## 2013-11-10 ENCOUNTER — Other Ambulatory Visit: Payer: Self-pay | Admitting: Family Medicine

## 2013-11-12 ENCOUNTER — Encounter: Payer: Self-pay | Admitting: Family Medicine

## 2013-11-12 ENCOUNTER — Ambulatory Visit (INDEPENDENT_AMBULATORY_CARE_PROVIDER_SITE_OTHER): Payer: Medicare Other | Admitting: Family Medicine

## 2013-11-12 VITALS — BP 127/52 | HR 63 | Temp 97.9°F | Ht 63.0 in | Wt 176.2 lb

## 2013-11-12 DIAGNOSIS — I2510 Atherosclerotic heart disease of native coronary artery without angina pectoris: Secondary | ICD-10-CM

## 2013-11-12 DIAGNOSIS — E785 Hyperlipidemia, unspecified: Secondary | ICD-10-CM

## 2013-11-12 DIAGNOSIS — R040 Epistaxis: Secondary | ICD-10-CM | POA: Insufficient documentation

## 2013-11-12 DIAGNOSIS — K649 Unspecified hemorrhoids: Secondary | ICD-10-CM

## 2013-11-12 DIAGNOSIS — R413 Other amnesia: Secondary | ICD-10-CM

## 2013-11-12 DIAGNOSIS — K219 Gastro-esophageal reflux disease without esophagitis: Secondary | ICD-10-CM

## 2013-11-12 LAB — POCT CBC
Granulocyte percent: 58.4 %G (ref 37–80)
HCT, POC: 40.2 % (ref 37.7–47.9)
Hemoglobin: 12.7 g/dL (ref 12.2–16.2)
Lymph, poc: 1.7 (ref 0.6–3.4)
MCH, POC: 27.9 pg (ref 27–31.2)
MCHC: 31.5 g/dL — AB (ref 31.8–35.4)
MCV: 88.6 fL (ref 80–97)
MPV: 6.1 fL (ref 0–99.8)
POC Granulocyte: 2.9 (ref 2–6.9)
POC LYMPH PERCENT: 34.4 %L (ref 10–50)
Platelet Count, POC: 236 10*3/uL (ref 142–424)
RBC: 4.5 M/uL (ref 4.04–5.48)
RDW, POC: 14.4 %
WBC: 5 10*3/uL (ref 4.6–10.2)

## 2013-11-12 NOTE — Progress Notes (Signed)
Patient ID: Kristina Gibbs, female   DOB: 1945-11-19, 68 y.o.   MRN: [id number] SUBJECTIVE: CC: Chief Complaint  Patient presents with  . Follow-up    follow up medical problems     HPI: Patient is here for follow up of hyperlipidemia/HTN/CAD/: denies Headache;denies Chest Pain;denies weakness;denies Shortness of Breath and orthopnea;denies Visual changes;denies palpitations;denies cough;denies pedal edema;denies symptoms of TIA or stroke;deniesClaudication symptoms. admits to Compliance with medications; denies Problems with medications.  Having nose bleeds. Hemorrhoids acting up as well. Colonoscopy uptodate.  Was doing vegetable smoothie but had a nose bleed into her smoothie and it turned her stomach and she couldn't drink it anymore and she got constipated. Needs to go back on the amitiza.  Past Medical History  Diagnosis Date  . Hypercholesteremia   . Memory impairment      MMSE 27/30  . Cancer     ovarian  . Arthritis   . Allergy   . Osteopenia   . Coronary artery disease   . Hypertension    Past Surgical History  Procedure Laterality Date  . Coronary artery bypass graft    . Abdominal hysterectomy     History   Social History  . Marital Status: Married    Spouse Name: N/A    Number of Children: N/A  . Years of Education: N/A   Occupational History  . Not on file.   Social History Main Topics  . Smoking status: Former Research scientist (life sciences)  . Smokeless tobacco: Not on file  . Alcohol Use: 0.6 oz/week    1 Glasses of wine per week  . Drug Use: No  . Sexual Activity:    Other Topics Concern  . Not on file   Social History Narrative  . No narrative on file   No family history on file. Current Outpatient Prescriptions on File Prior to Visit  Medication Sig Dispense Refill  . alendronate (FOSAMAX) 70 MG tablet TAKE ONE TABLET BY MOUTH EVERY WEEK  4 tablet  4  . aspirin EC 81 MG tablet Take 81 mg by mouth daily.      Marland Kitchen b complex vitamins tablet Take 2  tablets by mouth daily.      . calcium carbonate (OS-CAL) 600 MG TABS Take 600 mg by mouth daily.      . cholecalciferol (VITAMIN D) 1000 UNITS tablet Take 1,000 Units by mouth daily.      . clopidogrel (PLAVIX) 75 MG tablet TAKE 1 TABLET EVERY DAY  30 tablet  5  . CO ENZYME Q-10 PO Take 1 tablet by mouth daily.      . CRESTOR 20 MG tablet TAKE 1 TABLET BY MOUTH DAILY  30 tablet  11  . irbesartan (AVAPRO) 75 MG tablet TAKE 1 TABLET (75 MG TOTAL) BY MOUTH AT BEDTIME.  30 tablet  10  . isosorbide mononitrate (IMDUR) 60 MG 24 hr tablet TAKE 1 TABLE DAILY  30 tablet  11  . LOVAZA 1 G capsule TAKE 2 CAPSULES (2 G TOTAL) BY MOUTH 2 (TWO) TIMES DAILY.  120 capsule  4  . lubiprostone (AMITIZA) 24 MCG capsule Take 24 mcg by mouth 2 (two) times daily with a meal. Prn      . metoprolol tartrate (LOPRESSOR) 25 MG tablet Take 12.5 mg by mouth daily.      . nitroGLYCERIN (NITROLINGUAL) 0.4 MG/SPRAY spray Place 1 spray under the tongue every 5 (five) minutes as needed for chest pain.  12 g  2  . pantoprazole (PROTONIX)  40 MG tablet Take 40 mg by mouth daily.      . ranolazine (RANEXA) 500 MG 12 hr tablet Take 2 tablets (1,000 mg total) by mouth 2 (two) times daily.  60 tablet  11  . triamterene-hydrochlorothiazide (DYAZIDE) 37.5-25 MG per capsule Take 1 capsule by mouth daily as needed. For fluid retention.      . vitamin C (ASCORBIC ACID) 500 MG tablet Take 1,500 mg by mouth daily.       No current facility-administered medications on file prior to visit.   Allergies  Allergen Reactions  . Codeine   . Hydrocortisone     Hydrocortisone cream makes her itch   Immunization History  Administered Date(s) Administered  . Influenza,inj,Quad PF,36+ Mos 07/30/2013   Prior to Admission medications   Medication Sig Start Date End Date Taking? Authorizing Provider  alendronate (FOSAMAX) 70 MG tablet TAKE ONE TABLET BY MOUTH EVERY WEEK 06/02/13  Yes Vernie Shanks, MD  aspirin EC 81 MG tablet Take 81 mg by mouth  daily.   Yes Historical Provider, MD  b complex vitamins tablet Take 2 tablets by mouth daily.   Yes Historical Provider, MD  calcium carbonate (OS-CAL) 600 MG TABS Take 600 mg by mouth daily.   Yes Historical Provider, MD  cholecalciferol (VITAMIN D) 1000 UNITS tablet Take 1,000 Units by mouth daily.   Yes Historical Provider, MD  clopidogrel (PLAVIX) 75 MG tablet TAKE 1 TABLET EVERY DAY 08/25/13  Yes [name], MD  CO ENZYME Q-10 PO Take 1 tablet by mouth daily.   Yes Historical Provider, MD  CRESTOR 20 MG tablet TAKE 1 TABLET BY MOUTH DAILY 10/01/13  Yes [name], MD  Docusate Calcium (SURFAK PO) Take 1 tablet by mouth daily.   Yes Historical Provider, MD  irbesartan (AVAPRO) 75 MG tablet TAKE 1 TABLET (75 MG TOTAL) BY MOUTH AT BEDTIME. 10/28/13  Yes [name], MD  isosorbide mononitrate (IMDUR) 60 MG 24 hr tablet TAKE 1 TABLE DAILY 10/02/13  Yes [name], MD  LOVAZA 1 G capsule TAKE 2 CAPSULES (2 G TOTAL) BY MOUTH 2 (TWO) TIMES DAILY. 08/25/13  Yes Vernie Shanks, MD  lubiprostone (AMITIZA) 24 MCG capsule Take 24 mcg by mouth 2 (two) times daily with a meal. Prn   Yes Historical Provider, MD  metoprolol tartrate (LOPRESSOR) 25 MG tablet Take 12.5 mg by mouth daily.   Yes Historical Provider, MD  nitroGLYCERIN (NITROLINGUAL) 0.4 MG/SPRAY spray Place 1 spray under the tongue every 5 (five) minutes as needed for chest pain. 10/25/13  Yes [name], MD  pantoprazole (PROTONIX) 40 MG tablet Take 40 mg by mouth daily.   Yes Historical Provider, MD  ranolazine (RANEXA) 500 MG 12 hr tablet Take 2 tablets (1,000 mg total) by mouth 2 (two) times daily. 09/13/13  Yes [name], MD  triamterene-hydrochlorothiazide (DYAZIDE) 37.5-25 MG per capsule Take 1 capsule by mouth daily as needed. For fluid retention.   Yes Historical Provider, MD  vitamin C (ASCORBIC ACID) 500 MG tablet Take 1,500 mg by mouth daily.   Yes Historical Provider, MD     ROS: As above in the  HPI. All other systems are stable or negative.  OBJECTIVE: APPEARANCE:  Patient in no acute distress.The patient appeared well nourished and normally developed. Acyanotic. Waist: VITAL SIGNS:BP 127/52  Pulse 63  Temp(Src) 97.9 F (36.6 C) (Oral)  Ht 5\' 3"  (1.6 m)  Wt 176 lb 3.2 oz (79.924 kg)  BMI 31.22 kg/m2 WF Obese.   SKIN: warm and  Dry without overt rashes, tattoos and scars  HEAD and Neck: without JVD, Head and scalp: normal Eyes:No scleral icterus. Fundi normal, eye movements normal. Ears: Auricle normal, canal normal, Tympanic membranes normal, insufflation normal. Nose: medial nasal septum dry and cracked. No active bleeding Throat: normal Neck & thyroid: normal  CHEST & LUNGS: Chest wall: normal Lungs: Clear  CVS: Reveals the PMI to be normally located. Regular rhythm, First and Second Heart sounds are normal,  absence of murmurs, rubs or gallops. Peripheral vasculature: Radial pulses: normal Dorsal pedis pulses: normal Posterior pulses: normal  ABDOMEN:  Appearance: obese Benign, no organomegaly, no masses, no Abdominal Aortic enlargement. No Guarding , no rebound. No Bruits. Bowel sounds: normal  RECTAL: declined GU: N/A  EXTREMETIES: nonedematous.  MUSCULOSKELETAL:  Spine: normal Joints: intact  NEUROLOGIC: oriented to time,place and person; nonfocal. No change with her memory. Strength is normal Sensory is normal Reflexes are normal Cranial Nerves are normal.  Results for orders placed in visit on 11/12/13  POCT CBC      Result Value Range   WBC 5.0  4.6 - 10.2 K/uL   Lymph, poc 1.7  0.6 - 3.4   POC LYMPH PERCENT 34.4  10 - 50 %L   MID (cbc)    0 - 0.9   POC MID %    0 - 12 %M   POC Granulocyte 2.9  2 - 6.9   Granulocyte percent 58.4  37 - 80 %G   RBC 4.5  4.04 - 5.48 M/uL   Hemoglobin 12.7  12.2 - 16.2 g/dL   HCT, POC 40.2  37.7 - 47.9 %   MCV 88.6  80 - 97 fL   MCH, POC 27.9  27 - 31.2 pg   MCHC 31.5 (*) 31.8 - 35.4 g/dL    RDW, POC 14.4     Platelet Count, POC 236.0  142 - 424 K/uL   MPV 6.1  0 - 99.8 fL    ASSESSMENT:  HLD (hyperlipidemia)  CORONARY ARTERY DISEASE  Memory impairment  GERD  Epistaxis - Plan: POCT CBC  Hemorrhoids secondary to constipation and needs to be back on her veggie smoothie to regulate her BMs and increase her fiber.  Use a little vaseline in the medial nasal septum t kepp mucosa mist. Medical problems otherwise stable  PLAN: Recommend a humidifier. Which she has. Continue the same medications.       Dr Paula Libra Recommendations  For nutrition information, I recommend books:  1).Eat to Live by Dr Excell Seltzer. 2).Prevent and Reverse Heart Disease by Dr Karl Luke. 3) Dr Janene Harvey Book:  Program to Reverse Diabetes  Exercise recommendations are:  If unable to walk, then the patient can exercise in a chair 3 times a day. By flapping arms like a bird gently and raising legs outwards to the front.  If ambulatory, the patient can go for walks for 30 minutes 3 times a week. Then increase the intensity and duration as tolerated.  Goal is to try to attain exercise frequency to 5 times a week.  If applicable: Best to perform resistance exercises (machines or weights) 2 days a week and cardio type exercises 3 days per week.  Handout: on hemorrhoids and epistaxis.  Reviewed her lipids and labs and copy given.  Get back on her smoothie.   Orders Placed This Encounter  Procedures  . POCT CBC   Meds ordered this encounter  Medications  . Docusate Calcium (SURFAK PO)    Sig: Take 1 tablet by mouth daily.   There are no discontinued medications. Return in about 3 months (around 02/09/2014) for Recheck medical problems.  Murel Wigle P. Jacelyn Grip, M.D.

## 2013-11-12 NOTE — Patient Instructions (Signed)
Nosebleed Nosebleeds can be caused by many conditions including trauma, infections, polyps, foreign bodies, dry mucous membranes or climate, medications and air conditioning. Most nosebleeds occur in the front of the nose. It is because of this location that most nosebleeds can be controlled by pinching the nostrils gently and continuously. Do this for at least 10 to 20 minutes. The reason for this long continuous pressure is that you must hold it long enough for the blood to clot. If during that 10 to 20 minute time period, pressure is released, the process may have to be started again. The nosebleed may stop by itself, quit with pressure, need concentrated heating (cautery) or stop with pressure from packing. HOME CARE INSTRUCTIONS   If your nose was packed, try to maintain the pack inside until your caregiver removes it. If a gauze pack was used and it starts to fall out, gently replace or cut the end off. Do not cut if a balloon catheter was used to pack the nose. Otherwise, do not remove unless instructed.  Avoid blowing your nose for 12 hours after treatment. This could dislodge the pack or clot and start bleeding again.  If the bleeding starts again, sit up and bending forward, gently pinch the front half of your nose continuously for 20 minutes.  If bleeding was caused by dry mucous membranes, cover the inside of your nose every morning with a petroleum or antibiotic ointment. Use your little fingertip as an applicator. Do this as needed during dry weather. This will keep the mucous membranes moist and allow them to heal.  Maintain humidity in your home by using less air conditioning or using a humidifier.  Do not use aspirin or medications which make bleeding more likely. Your caregiver can give you recommendations on this.  Resume normal activities as able but try to avoid straining, lifting or bending at the waist for several days.  If the nosebleeds become recurrent and the cause is  unknown, your caregiver may suggest laboratory tests. SEEK IMMEDIATE MEDICAL CARE IF:   Bleeding recurs and cannot be controlled.  There is unusual bleeding from or bruising on other parts of the body.  You have a fever.  Nosebleeds continue.  There is any worsening of the condition which originally brought you in.  You become lightheaded, feel faint, become sweaty or vomit blood. MAKE SURE YOU:   Understand these instructions.  Will watch your condition.  Will get help right away if you are not doing well or get worse. Document Released: 06/30/2005 Document Revised: 12/13/2011 Document Reviewed: 08/22/2009 [facility] Patient Information 2014 [place], [place].   Hemorrhoids Hemorrhoids are swollen veins around the rectum or anus. There are two types of hemorrhoids:   Internal hemorrhoids. These occur in the veins just inside the rectum. They may poke through to the outside and become irritated and painful.  External hemorrhoids. These occur in the veins outside the anus and can be felt as a painful swelling or hard lump near the anus. CAUSES  Pregnancy.   Obesity.   Constipation or diarrhea.   Straining to have a bowel movement.   Sitting for long periods on the toilet.  Heavy lifting or other activity that caused you to strain.  Anal intercourse. SYMPTOMS   Pain.   Anal itching or irritation.   Rectal bleeding.   Fecal leakage.   Anal swelling.   One or more lumps around the anus.  DIAGNOSIS  Your caregiver may be able to diagnose hemorrhoids by visual  examination. Other examinations or tests that may be performed include:   Examination of the rectal area with a gloved hand (digital rectal exam).   Examination of anal canal using a small tube (scope).   A blood test if you have lost a significant amount of blood.  A test to look inside the colon (sigmoidoscopy or colonoscopy). TREATMENT Most hemorrhoids can be treated at home. However,  if symptoms do not seem to be getting better or if you have a lot of rectal bleeding, your caregiver may perform a procedure to help make the hemorrhoids get smaller or remove them completely. Possible treatments include:   Placing a rubber band at the base of the hemorrhoid to cut off the circulation (rubber band ligation).   Injecting a chemical to shrink the hemorrhoid (sclerotherapy).   Using a tool to burn the hemorrhoid (infrared light therapy).   Surgically removing the hemorrhoid (hemorrhoidectomy).   Stapling the hemorrhoid to block blood flow to the tissue (hemorrhoid stapling).  HOME CARE INSTRUCTIONS   Eat foods with fiber, such as whole grains, beans, nuts, fruits, and vegetables. Ask your doctor about taking products with added fiber in them (fibersupplements).  Increase fluid intake. Drink enough water and fluids to keep your urine clear or pale yellow.   Exercise regularly.   Go to the bathroom when you have the urge to have a bowel movement. Do not wait.   Avoid straining to have bowel movements.   Keep the anal area dry and clean. Use wet toilet paper or moist towelettes after a bowel movement.   Medicated creams and suppositories may be used or applied as directed.   Only take over-the-counter or prescription medicines as directed by your caregiver.   Take warm sitz baths for 15 20 minutes, 3 4 times a day to ease pain and discomfort.   Place ice packs on the hemorrhoids if they are tender and swollen. Using ice packs between sitz baths may be helpful.   Put ice in a plastic bag.   Place a towel between your skin and the bag.   Leave the ice on for 15 20 minutes, 3 4 times a day.   Do not use a donut-shaped pillow or sit on the toilet for long periods. This increases blood pooling and pain.  SEEK MEDICAL CARE IF:  You have increasing pain and swelling that is not controlled by treatment or medicine.  You have uncontrolled  bleeding.  You have difficulty or you are unable to have a bowel movement.  You have pain or inflammation outside the area of the hemorrhoids. MAKE SURE YOU:  Understand these instructions.  Will watch your condition.  Will get help right away if you are not doing well or get worse. Document Released: 09/17/2000 Document Revised: 09/06/2012 Document Reviewed: 07/25/2012 [facility] Patient Information 2014 [place].        Dr Paula Libra Recommendations  For nutrition information, I recommend books:  1).Eat to Live by Dr Excell Seltzer. 2).Prevent and Reverse Heart Disease by Dr Karl Luke. 3) Dr Janene Harvey Book:  Program to Reverse Diabetes  Exercise recommendations are:  If unable to walk, then the patient can exercise in a chair 3 times a day. By flapping arms like a bird gently and raising legs outwards to the front.  If ambulatory, the patient can go for walks for 30 minutes 3 times a week. Then increase the intensity and duration as tolerated.  Goal is to try to attain exercise  frequency to 5 times a week.  If applicable: Best to perform resistance exercises (machines or weights) 2 days a week and cardio type exercises 3 days per week.

## 2013-11-13 ENCOUNTER — Other Ambulatory Visit: Payer: Self-pay | Admitting: *Deleted

## 2013-11-13 MED ORDER — RANOLAZINE ER 500 MG PO TB12: 500.0000 mg | ORAL_TABLET | Freq: Two times a day (BID) | ORAL | Status: DC

## 2013-12-07 ENCOUNTER — Other Ambulatory Visit: Payer: Self-pay | Admitting: Cardiology

## 2013-12-07 NOTE — Telephone Encounter (Signed)
Rx was sent to pharmacy electronically. 

## 2013-12-17 ENCOUNTER — Telehealth: Payer: Self-pay | Admitting: Family Medicine

## 2013-12-17 NOTE — Telephone Encounter (Signed)
appt with mae at 8

## 2013-12-18 ENCOUNTER — Encounter: Payer: Self-pay | Admitting: General Practice

## 2013-12-18 ENCOUNTER — Ambulatory Visit (INDEPENDENT_AMBULATORY_CARE_PROVIDER_SITE_OTHER): Payer: Medicare Other | Admitting: General Practice

## 2013-12-18 ENCOUNTER — Ambulatory Visit (INDEPENDENT_AMBULATORY_CARE_PROVIDER_SITE_OTHER): Payer: Medicare Other

## 2013-12-18 VITALS — BP 112/55 | HR 56 | Temp 97.0°F | Ht 63.0 in | Wt 176.2 lb

## 2013-12-18 DIAGNOSIS — M19019 Primary osteoarthritis, unspecified shoulder: Secondary | ICD-10-CM

## 2013-12-18 DIAGNOSIS — M19012 Primary osteoarthritis, left shoulder: Secondary | ICD-10-CM

## 2013-12-18 DIAGNOSIS — M25512 Pain in left shoulder: Secondary | ICD-10-CM

## 2013-12-18 DIAGNOSIS — M25519 Pain in unspecified shoulder: Secondary | ICD-10-CM

## 2013-12-18 NOTE — Patient Instructions (Signed)

## 2013-12-18 NOTE — Progress Notes (Signed)
   Subjective:    Patient ID: Kristina Gibbs, female    DOB: Oct 15, 1945, 68 y.o.   MRN: [id number]  Arm Injury  The incident occurred more than 1 week ago. The incident occurred at home. The injury mechanism was a direct blow. The pain is present in the left shoulder (left upper arm). The quality of the pain is described as aching. The pain does not radiate. The pain is at a severity of 6/10. The pain has been intermittent since the incident. Pertinent negatives include no chest pain, muscle weakness, numbness or tingling. The symptoms are aggravated by movement. She has tried nothing for the symptoms.  Patient reports hitting arm two weeks ago on bathroom door, reports bruising and a knot at the time, which has since then resolved.     Review of Systems  Constitutional: Negative for fever and chills.  Respiratory: Negative for chest tightness and shortness of breath.   Cardiovascular: Negative for chest pain.  Musculoskeletal:       Left arm and shoulder arm  Neurological: Negative for tingling and numbness.       Objective:   Physical Exam  Constitutional: She is oriented to person, place, and time. She appears well-developed and well-nourished.  Pulmonary/Chest: Effort normal and breath sounds normal. No respiratory distress. She exhibits no tenderness.  Musculoskeletal: She exhibits tenderness.  Left arm tenderness with abduction. Able to raise 90 degree, without difficulty. Negative edema, discoloration.   Neurological: She is alert and oriented to person, place, and time.  Skin: Skin is warm and dry.  Psychiatric: She has a normal mood and affect.     [facility] reading (PRIMARY) by Erby Pian, FNP-C, no fracture or dislocation noted.      Assessment & Plan:  1. Left shoulder pain  - DG Shoulder Left; Future  2. Osteoarthritis of left shoulder -patient education/home care instructions provided and discussed -RTO if symptoms worsen or no improvement -discussed  mobic, patient wishes to try non pharmacological treatment first -Patient verbalized understanding Erby Pian, FNP-C

## 2013-12-21 ENCOUNTER — Other Ambulatory Visit: Payer: Self-pay | Admitting: General Practice

## 2013-12-21 ENCOUNTER — Telehealth: Payer: Self-pay | Admitting: *Deleted

## 2013-12-21 DIAGNOSIS — S4292XA Fracture of left shoulder girdle, part unspecified, initial encounter for closed fracture: Secondary | ICD-10-CM

## 2013-12-21 NOTE — Telephone Encounter (Signed)
Returned call and pt verified x 2.  Pt stated she has had to use her spray (NTG) twice now and wants to come in to see someone.  Stated Dr. Gwenlyn Found told her she could call and talk to him and she isn't understanding why no one wants her to talk to him.  Pt informed Dr. Gwenlyn Found is out of the office.  Also informed the scheduler stated she tried to offer her an appt w/ Mickel Baas, NP and she didn't want to see her.  Pt stated she doesn't know Mickel Baas.  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 Gwenlyn Found isn't available.  Pt informed Dr. Kennon Holter first available appt is not until April 15th.  Pt verbalized understanding and agreed to see NP.  Before transferred to scheduling, pt was advised to go to ER for CP lasting longer than 30 mins and using 3 NTG sprays in 15-20 mins.  Pt verbalized understanding and agreed w/ plan.

## 2013-12-21 NOTE — Telephone Encounter (Signed)
Pt stated that she is need to see Dr. Gwenlyn Found next week. She was offered to come in to see Mickel Baas next week and she stated that she did not know who Mickel Baas was. I told her that Gaspar Bidding did not have anything next week nor did Dr. Gwenlyn Found. She said that I was not understanding her that she needed to be seen. I am not sure what else to do after I offered her an appointment with Mickel Baas for next week. I explained to her that she will consult with Dr. Gwenlyn Found about what was going on.   JB

## 2013-12-22 ENCOUNTER — Encounter (INDEPENDENT_AMBULATORY_CARE_PROVIDER_SITE_OTHER): Payer: Medicare Other | Admitting: Family Medicine

## 2013-12-22 NOTE — Progress Notes (Signed)
Patient ID: Kristina Gibbs, female   DOB: 1946/01/24, 68 y.o.   MRN: [id number] Here for a shoulder sling to be applied by nurse. Not seen by the MD. Edwyna Shell. Jacelyn Grip, M.D.

## 2013-12-25 ENCOUNTER — Encounter: Payer: Self-pay | Admitting: Cardiology

## 2013-12-25 ENCOUNTER — Ambulatory Visit (INDEPENDENT_AMBULATORY_CARE_PROVIDER_SITE_OTHER): Payer: Medicare Other | Admitting: Cardiology

## 2013-12-25 VITALS — BP 120/60 | HR 53 | Ht 63.0 in | Wt 175.0 lb

## 2013-12-25 DIAGNOSIS — I25810 Atherosclerosis of coronary artery bypass graft(s) without angina pectoris: Secondary | ICD-10-CM

## 2013-12-25 DIAGNOSIS — I209 Angina pectoris, unspecified: Secondary | ICD-10-CM

## 2013-12-25 DIAGNOSIS — I208 Other forms of angina pectoris: Secondary | ICD-10-CM

## 2013-12-25 DIAGNOSIS — R079 Chest pain, unspecified: Secondary | ICD-10-CM

## 2013-12-25 DIAGNOSIS — I2510 Atherosclerotic heart disease of native coronary artery without angina pectoris: Secondary | ICD-10-CM

## 2013-12-25 NOTE — Patient Instructions (Signed)
We will do a lexiscan myoview stress test to evaluate your chest pain.  Call us if you continue with episodes of chest pain.  Follow up in 2 weeks with me on a day Dr. Gwenlyn Found in the office for results of test.

## 2013-12-25 NOTE — Progress Notes (Signed)
12/28/2013   PCP: Anthoney Harada, MD   Chief Complaint  Patient presents with  . Chest Pain    CP x 2 Weeks also ankles has been swelling    Primary Cardiologist:  Dr. Gwenlyn Found  HPI:  68 year old female with a history of CAD since 1995, also with hypertension, hyperlipidemia presenting  with complaints of chest pain.    Patient required a multivessel CABG by Dr. Roxan Hockey in March 2010 after being found to have 70% occlusion of left main, left internal mammary artery to LAD,  saphenous vein graft to obtuse marginal 1, saphenous vein graft to  posterior descending.  Subsequent cath in December 2010 by Dr. Gwenlyn Found showed occluded vein grafts, patent LIMA but atretic LIMA to LAD with limited flow, POBA site to LAD patent, 60% main left, 30% ISR of the right and 50% bifurcation disease of the RCA distally. She had a Myoview in June 2013 which was low risk for significant ischemia.  If worsening LAD disease it was thought she would be a good candidate for Lt main intervention.  She has had episodes of chest pain in the past and has been treated medically.  Now with episodes last week- 2 episodes with rest of chest pain. Associated with diaphoresis and one with nausea.  No SOB.  NTG relieved the pain.  She has been more stressed lately but she is concerned about this pain.  It has been since 2013 since last stress test.  Unable to increase BB due to her bradycardia.  She is on imdur and ranexa.       Allergies  Allergen Reactions  . Codeine   . Hydrocortisone     Hydrocortisone cream makes her itch    Current Outpatient Prescriptions  Medication Sig Dispense Refill  . alendronate (FOSAMAX) 70 MG tablet TAKE ONE TABLET BY MOUTH EVERY WEEK  4 tablet  5  . aspirin EC 81 MG tablet Take 81 mg by mouth daily.      Marland Kitchen b complex vitamins tablet Take 2 tablets by mouth daily.      . calcium carbonate (OS-CAL) 600 MG TABS Take 600 mg by mouth daily.      . cholecalciferol  (VITAMIN D) 1000 UNITS tablet Take 1,000 Units by mouth daily.      . clopidogrel (PLAVIX) 75 MG tablet TAKE 1 TABLET EVERY DAY  30 tablet  5  . CO ENZYME Q-10 PO Take 1 tablet by mouth daily.      . CRESTOR 20 MG tablet TAKE 1 TABLET BY MOUTH DAILY  30 tablet  11  . Docusate Calcium (SURFAK PO) Take 1 tablet by mouth daily.      . irbesartan (AVAPRO) 75 MG tablet TAKE 1 TABLET (75 MG TOTAL) BY MOUTH AT BEDTIME.  30 tablet  8  . isosorbide mononitrate (IMDUR) 60 MG 24 hr tablet TAKE 1 TABLE DAILY  30 tablet  11  . LOVAZA 1 G capsule TAKE 2 CAPSULES (2 G TOTAL) BY MOUTH 2 (TWO) TIMES DAILY.  120 capsule  4  . lubiprostone (AMITIZA) 24 MCG capsule Take 24 mcg by mouth 2 (two) times daily with a meal. Prn      . metoprolol tartrate (LOPRESSOR) 25 MG tablet Take 12.5 mg by mouth daily.      . nitroGLYCERIN (NITROLINGUAL) 0.4 MG/SPRAY spray Place 1 spray under the tongue every 5 (five) minutes as needed for chest pain.  12 g  2  . pantoprazole (PROTONIX) 40 MG tablet Take 40 mg by mouth daily.      . ranolazine (RANEXA) 500 MG 12 hr tablet Take 1 tablet (500 mg total) by mouth 2 (two) times daily.  60 tablet  6  . triamterene-hydrochlorothiazide (DYAZIDE) 37.5-25 MG per capsule Take 1 capsule by mouth daily as needed. For fluid retention.      . vitamin C (ASCORBIC ACID) 500 MG tablet Take 1,500 mg by mouth daily.       No current facility-administered medications for this visit.    Past Medical History  Diagnosis Date  . Hypercholesteremia   . Memory impairment      MMSE 27/30  . Cancer     ovarian  . Arthritis   . Allergy   . Osteopenia   . Coronary artery disease   . Hypertension     Past Surgical History  Procedure Laterality Date  . Coronary artery bypass graft    . Abdominal hysterectomy      NOM:VEHMCNO:BS colds or fevers, no weight changes Skin:no rashes or ulcers HEENT:no blurred vision, no congestion CV:see HPI PUL:see HPI GI:no diarrhea constipation or melena, no  indigestion GU:no hematuria, no dysuria MS:no joint pain, no claudication Neuro:no syncope, no lightheadedness Endo:no diabetes, no thyroid disease  PHYSICAL EXAM BP 120/60  Pulse 53  Ht 5\' 3"  (1.6 m)  Wt 175 lb (79.379 kg)  BMI 31.01 kg/m2 General:Pleasant affect, NAD Skin:Warm and dry, brisk capillary refill HEENT:normocephalic, sclera clear, mucus membranes moist Neck:supple, no JVD, no bruits  Heart:S1S2 RRR without murmur, gallup, rub or click Lungs:clear without rales, rhonchi, or wheezes JGG:EZMO, non tender, + BS, do not palpate liver spleen or masses Ext:no lower ext edema, 2+ pedal pulses, 2+ radial pulses Neuro:alert and oriented, MAE, follows commands, + facial symmetry EKG: S brady no acute changes  ASSESSMENT AND PLAN Angina at rest More frequent episodes of chest pain.  2 episodes last week.  NTG responsive.  Last nuc 2013- will recheck lexiscan myoview to eval for increased ischemia to adjust meds or to recath.  Pt unable to walk on treadmill long enough for sufficient test.  If pain continues despite normal study may need to repeat cath.    CORONARY ARTERY DISEASE, with CABG 2010 and graft occl.at aorta and atretic LIMA Recheck nuc study- may need to proceed to cath.

## 2013-12-27 ENCOUNTER — Telehealth (HOSPITAL_COMMUNITY): Payer: Self-pay

## 2013-12-28 NOTE — Assessment & Plan Note (Addendum)
More frequent episodes of chest pain.  2 episodes last week.  NTG responsive.  Last nuc 2013- will recheck lexiscan myoview to eval for increased ischemia to adjust meds or to recath.  Pt unable to walk on treadmill long enough for sufficient test.  If pain continues despite normal study may need to repeat cath.

## 2013-12-28 NOTE — Assessment & Plan Note (Signed)
Recheck nuc study- may need to proceed to cath.

## 2014-01-01 ENCOUNTER — Ambulatory Visit (HOSPITAL_COMMUNITY)
Admission: RE | Admit: 2014-01-01 | Discharge: 2014-01-01 | Disposition: A | Discharge status: Home / Self Care | Payer: Medicare Other | Source: Ambulatory Visit | Attending: Cardiovascular Disease | Admitting: Cardiovascular Disease

## 2014-01-01 DIAGNOSIS — R079 Chest pain, unspecified: Secondary | ICD-10-CM | POA: Insufficient documentation

## 2014-01-01 DIAGNOSIS — I25810 Atherosclerosis of coronary artery bypass graft(s) without angina pectoris: Secondary | ICD-10-CM | POA: Insufficient documentation

## 2014-01-01 MED ORDER — REGADENOSON 0.4 MG/5ML IV SOLN
0.4000 mg | Freq: Once | INTRAVENOUS | Status: AC
  Administered 2014-01-01: 0.4 mg via INTRAVENOUS

## 2014-01-01 MED ORDER — TECHNETIUM TC 99M SESTAMIBI GENERIC - CARDIOLITE
9.9000 | Freq: Once | INTRAVENOUS | Status: AC | PRN
  Administered 2014-01-01: 10 via INTRAVENOUS

## 2014-01-01 MED ORDER — TECHNETIUM TC 99M SESTAMIBI GENERIC - CARDIOLITE
30.7000 | Freq: Once | INTRAVENOUS | Status: AC | PRN
  Administered 2014-01-01: 30.7 via INTRAVENOUS

## 2014-01-01 NOTE — Procedures (Addendum)
[place] [address] [place] [address] [phone]  Cardiology Nuclear Med Study  Kristina Gibbs is a 68 y.o. female     MRN : [id number]     DOB: Sep 28, 1946  Procedure Date: 01/01/2014  Nuclear Med Background Indication for Stress Test:  Graft Patency History:  CAD;MI;CABG X4-12/2008;STENT/PTCA;lEXI mpi ON 03/14/2012-nonischemic;EF=75%;ECHO 10/2012-EF=55-65%-normal LV function Cardiac Risk Factors: Carotid Disease, Family History - CAD, History of Smoking, Hypertension, Lipids, Obesity and PVD  Symptoms:  Chest Pain, DOE, Fatigue and Palpitations   Nuclear Pre-Procedure Caffeine/Decaff Intake:  9:30pm NPO After: 7:30am   IV Site: R Forearm  IV 0.9% NS with Angio Cath:  22g  Chest Size (in):  n/a IV Started by: Azucena Cecil, RN  Height: 5\' 3"  (1.6 m)  Cup Size: B  BMI:  Body mass index is 31.01 kg/(m^2). Weight:  175 lb (79.379 kg)   Tech Comments:  n/a    Nuclear Med Study 1 or 2 day study: 1 day  Stress Test Type:  Jal Provider:  Quay Burow, MD   Resting Radionuclide: Technetium 46m Sestamibi  Resting Radionuclide Dose: 9.9 mCi   Stress Radionuclide:  Technetium 66m Sestamibi  Stress Radionuclide Dose: 30.7 mCi           Stress Protocol Rest HR: 48 Stress HR: 92  Rest BP: 139/65 Stress BP:158/50  Exercise Time (min): n/a METS: n/a          Dose of Adenosine (mg):  n/a Dose of Lexiscan: 0.4 mg  Dose of Atropine (mg): n/a Dose of Dobutamine: n/a mcg/kg/min (at max HR)  Stress Test Technologist: Mellody Memos, CCT Nuclear Technologist: Imagene Riches, CNMT   Rest Procedure:  Myocardial perfusion imaging was performed at rest 45 minutes following the intravenous administration of Technetium 20m Sestamibi. Stress Procedure:  The patient received IV Lexiscan 0.4 mg over 15-seconds.  Technetium 64m Sestamibi injected at 30-seconds.  There were no significant changes with  Lexiscan.  Quantitative spect images were obtained after a 45 minute delay.  Transient Ischemic Dilatation (Normal <1.22):  0.76 Lung/Heart Ratio (Normal <0.45):  0.27 QGS EDV:  66 ml QGS ESV:  18 ml LV Ejection Fraction: 73%       Rest ECG: NSR - Normal EKG  Stress ECG: No significant change from baseline ECG  QPS Raw Data Images:  Normal; no motion artifact; normal heart/lung ratio. Stress Images:  Normal homogeneous uptake in all areas of the myocardium. Rest Images:  Normal homogeneous uptake in all areas of the myocardium. Subtraction (SDS):  No evidence of ischemia.  Impression Exercise Capacity:  Lexiscan with no exercise. BP Response:  Normal blood pressure response. Clinical Symptoms:  No significant symptoms noted. ECG Impression:  No significant ST segment change suggestive of ischemia. Comparison with Prior Nuclear Study: No significant change from previous study  Overall Impression:  Normal stress nuclear study.  LV Wall Motion:  NL LV Function; NL Wall Motion   [name], MD  01/01/2014 1:11 PM

## 2014-01-02 ENCOUNTER — Other Ambulatory Visit: Payer: Self-pay | Admitting: Dermatology

## 2014-01-04 ENCOUNTER — Other Ambulatory Visit: Payer: Self-pay | Admitting: *Deleted

## 2014-01-04 MED ORDER — PANTOPRAZOLE SODIUM 40 MG PO TBEC: 40.0000 mg | DELAYED_RELEASE_TABLET | Freq: Every day | ORAL | Status: DC

## 2014-01-04 NOTE — Telephone Encounter (Signed)
Rx refill sent to patients pharmacy  

## 2014-01-08 ENCOUNTER — Encounter: Payer: Self-pay | Admitting: Cardiology

## 2014-01-08 ENCOUNTER — Ambulatory Visit (INDEPENDENT_AMBULATORY_CARE_PROVIDER_SITE_OTHER): Payer: Medicare Other | Admitting: Cardiology

## 2014-01-08 VITALS — BP 142/62 | HR 64 | Ht 63.0 in | Wt 176.5 lb

## 2014-01-08 DIAGNOSIS — I208 Other forms of angina pectoris: Secondary | ICD-10-CM

## 2014-01-08 DIAGNOSIS — I209 Angina pectoris, unspecified: Secondary | ICD-10-CM

## 2014-01-08 DIAGNOSIS — I2510 Atherosclerotic heart disease of native coronary artery without angina pectoris: Secondary | ICD-10-CM

## 2014-01-08 NOTE — Progress Notes (Signed)
01/10/2014   PCP: Anthoney Harada, MD   Chief Complaint  Patient presents with  . Follow-up    Stress test. C/o leg pain and edema-hands, face, legs, ankles.     Primary Cardiologist: Dr. Gwenlyn Found  HPI:  68 year old female with a history of CAD since 1995, also with hypertension, hyperlipidemia presenting with complaints of chest pain.   Patient required a multivessel CABG by Dr. Roxan Hockey in March 2010 after being found to have 70% occlusion of left main, left internal mammary artery to LAD,  saphenous vein graft to obtuse marginal 1, saphenous vein graft to  posterior descending. Subsequent cath in December 2010 by Dr. Gwenlyn Found showed occluded vein grafts, patent LIMA but atretic LIMA to LAD with limited flow, POBA site to LAD patent, 60% main left, 30% ISR of the right and 50% bifurcation disease of the RCA distally. She had a Myoview in June 2013 which was low risk for significant ischemia. If worsening LAD disease it was thought she would be a good candidate for Lt main intervention.   She has had episodes of chest pain in the past and has been treated medically. Now with episodes last week- 2 episodes with rest of chest pain. Associated with diaphoresis and one with nausea. No SOB. NTG relieved the pain. She has been more stressed lately but she is concerned about this pain. It has been since 2013 since last stress test. Unable to increase BB due to her bradycardia. She is on imdur and ranexa. I saw her and ordered Lexiscan myoview.  Normal stress test and normal LV function.  We reviewed the results.  She tells me she is feeling better. Only pain now is if she lies on her lt side.    She does ask about stopping Plavix for bruising.  I have asked her to continue, other agents may cause increased bruising.      Allergies  Allergen Reactions  . Codeine   . Hydrocortisone     Hydrocortisone cream makes her itch    Current Outpatient Prescriptions  Medication Sig  Dispense Refill  . alendronate (FOSAMAX) 70 MG tablet TAKE ONE TABLET BY MOUTH EVERY WEEK  4 tablet  5  . aspirin EC 81 MG tablet Take 81 mg by mouth daily.      Marland Kitchen b complex vitamins tablet Take 2 tablets by mouth daily.      . calcium carbonate (OS-CAL) 600 MG TABS Take 600 mg by mouth daily.      . cholecalciferol (VITAMIN D) 1000 UNITS tablet Take 1,000 Units by mouth daily.      . clopidogrel (PLAVIX) 75 MG tablet TAKE 1 TABLET EVERY DAY  30 tablet  5  . CO ENZYME Q-10 PO Take 1 tablet by mouth daily.      . CRESTOR 20 MG tablet TAKE 1 TABLET BY MOUTH DAILY  30 tablet  11  . Docusate Calcium (SURFAK PO) Take 1 tablet by mouth daily.      . irbesartan (AVAPRO) 75 MG tablet TAKE 1 TABLET (75 MG TOTAL) BY MOUTH AT BEDTIME.  30 tablet  8  . isosorbide mononitrate (IMDUR) 60 MG 24 hr tablet TAKE 1 TABLE DAILY  30 tablet  11  . LOVAZA 1 G capsule TAKE 2 CAPSULES (2 G TOTAL) BY MOUTH 2 (TWO) TIMES DAILY.  120 capsule  4  . lubiprostone (AMITIZA) 24 MCG capsule Take 24 mcg by mouth 2 (two) times  daily with a meal. Prn      . metoprolol tartrate (LOPRESSOR) 25 MG tablet Take 12.5 mg by mouth daily.      . nitroGLYCERIN (NITROLINGUAL) 0.4 MG/SPRAY spray Place 1 spray under the tongue every 5 (five) minutes as needed for chest pain.  12 g  2  . pantoprazole (PROTONIX) 40 MG tablet Take 1 tablet (40 mg total) by mouth daily.  30 tablet  6  . ranolazine (RANEXA) 500 MG 12 hr tablet Take 1 tablet (500 mg total) by mouth 2 (two) times daily.  60 tablet  6  . triamterene-hydrochlorothiazide (DYAZIDE) 37.5-25 MG per capsule Take 1 capsule by mouth daily as needed. For fluid retention.      . vitamin C (ASCORBIC ACID) 500 MG tablet Take 1,500 mg by mouth daily.       No current facility-administered medications for this visit.    Past Medical History  Diagnosis Date  . Hypercholesteremia   . Memory impairment      MMSE 27/30  . Cancer     ovarian  . Arthritis   . Allergy   . Osteopenia   .  Coronary artery disease   . Hypertension     Past Surgical History  Procedure Laterality Date  . Coronary artery bypass graft    . Abdominal hysterectomy      ION:GEXBMWU:XL colds or fevers, no weight changes Skin:no rashes or ulcers HEENT:no blurred vision, no congestion CV:see HPI PUL:see HPI GI:no diarrhea constipation or melena, no indigestion GU:no hematuria, no dysuria MS:no joint pain, no claudication, some edema at ankles Neuro:no syncope, no lightheadedness Endo:no diabetes, no thyroid disease  PHYSICAL EXAM BP 142/62  Pulse 64  Ht 5\' 3"  (1.6 m)  Wt 176 lb 8 oz (80.06 kg)  BMI 31.27 kg/m2 General:Pleasant affect, NAD Skin:Warm and dry, brisk capillary refill HEENT:normocephalic, sclera clear, mucus membranes moist Neck:supple, no JVD, no bruits  Heart:S1S2 RRR without murmur, gallup, rub or click Lungs:clear without rales, rhonchi, or wheezes KGM:WNUU, non tender, + BS, do not palpate liver spleen or masses Ext:+ lower ext edema at ankles, 2+ pedal pulses, 2+ radial pulses Neuro:alert and oriented, MAE, follows commands, + facial symmetry   ASSESSMENT AND PLAN Angina at rest Negative lexiscan myoview, she is feeling better and is reassured.  CORONARY ARTERY DISEASE, with CABG 2010 and graft occl.at aorta and atretic LIMA See above  She will follow up with Dr. Gwenlyn Found in 2 months for further eval.

## 2014-01-08 NOTE — Patient Instructions (Signed)
Follow up with Dr. Gwenlyn Found in 2 months for follow up of stress test and to monitor.  You can discuss Plavix with him again at that time.

## 2014-01-10 NOTE — Assessment & Plan Note (Signed)
See above

## 2014-01-10 NOTE — Assessment & Plan Note (Signed)
Negative lexiscan myoview, she is feeling better and is reassured.

## 2014-01-11 ENCOUNTER — Other Ambulatory Visit: Payer: Self-pay | Admitting: Family Medicine

## 2014-01-12 ENCOUNTER — Ambulatory Visit (INDEPENDENT_AMBULATORY_CARE_PROVIDER_SITE_OTHER): Payer: Medicare Other | Admitting: Nurse Practitioner

## 2014-01-12 VITALS — BP 114/52 | HR 58 | Temp 97.7°F | Ht 63.0 in | Wt 177.2 lb

## 2014-01-12 DIAGNOSIS — J309 Allergic rhinitis, unspecified: Secondary | ICD-10-CM

## 2014-01-12 MED ORDER — FLUTICASONE PROPIONATE 50 MCG/ACT NA SUSP: 2.0000 | Freq: Every day | NASAL | Status: DC

## 2014-01-12 NOTE — Progress Notes (Signed)
   Subjective:    Patient ID: Kristina Gibbs, female    DOB: 1946-04-16, 68 y.o.   MRN: [id number]  HPI Patient in today c/o cough and congestion- started last night- no fever.    Review of Systems  Constitutional: Negative for fever, chills and appetite change.  HENT: Positive for congestion, rhinorrhea, sinus pressure and sore throat. Negative for voice change.   Respiratory: Positive for cough.   Cardiovascular: Negative.   Genitourinary: Negative.   Psychiatric/Behavioral: Negative.   All other systems reviewed and are negative.      Objective:   Physical Exam  Constitutional: She is oriented to person, place, and time. She appears well-developed and well-nourished.  HENT:  Right Ear: Hearing, tympanic membrane, external ear and ear canal normal.  Left Ear: Hearing, tympanic membrane, external ear and ear canal normal.  Nose: Mucosal edema and rhinorrhea present. Right sinus exhibits no maxillary sinus tenderness and no frontal sinus tenderness. Left sinus exhibits no maxillary sinus tenderness and no frontal sinus tenderness.  Mouth/Throat: Uvula is midline, oropharynx is clear and moist and mucous membranes are normal.  Eyes: Pupils are equal, round, and reactive to light.  Neck: Normal range of motion. Neck supple.  Cardiovascular: Normal rate, regular rhythm and normal heart sounds.   Pulmonary/Chest: Effort normal and breath sounds normal.  Neurological: She is alert and oriented to person, place, and time.  Skin: Skin is warm.  Psychiatric: She has a normal mood and affect. Her behavior is normal. Judgment and thought content normal.   BP 114/52  Pulse 58  Temp(Src) 97.7 F (36.5 C) (Oral)  Ht 5\' 3"  (1.6 m)  Wt 177 lb 3.2 oz (80.377 kg)  BMI 31.40 kg/m2        Assessment & Plan:   1. Allergic rhinitis    Meds ordered this encounter  Medications  . fluticasone (FLONASE) 50 MCG/ACT nasal spray    Sig: Place 2 sprays into both nostrils daily.   Dispense:  16 g    Refill:  6    Order Specific Question:  Supervising Provider    Answer:  Chipper Herb [[id number]]   Force fluids Avoid allergens Follow up prn  Mary-Margaret Hassell Done, FNP

## 2014-01-12 NOTE — Patient Instructions (Signed)
Allergic Rhinitis Allergic rhinitis is when the mucous membranes in the nose respond to allergens. Allergens are particles in the air that cause your body to have an allergic reaction. This causes you to release allergic antibodies. Through a chain of events, these eventually cause you to release histamine into the blood stream. Although meant to protect the body, it is this release of histamine that causes your discomfort, such as frequent sneezing, congestion, and an itchy, runny nose.  CAUSES  Seasonal allergic rhinitis (hay fever) is caused by pollen allergens that may come from grasses, trees, and weeds. Year-round allergic rhinitis (perennial allergic rhinitis) is caused by allergens such as house dust mites, pet dander, and mold spores.  SYMPTOMS   Nasal stuffiness (congestion).  Itchy, runny nose with sneezing and tearing of the eyes. DIAGNOSIS  Your health care provider can help you determine the allergen or allergens that trigger your symptoms. If you and your health care provider are unable to determine the allergen, skin or blood testing may be used. TREATMENT  Allergic Rhinitis does not have a cure, but it can be controlled by:  Medicines and allergy shots (immunotherapy).  Avoiding the allergen. Hay fever may often be treated with antihistamines in pill or nasal spray forms. Antihistamines block the effects of histamine. There are over-the-counter medicines that may help with nasal congestion and swelling around the eyes. Check with your health care provider before taking or giving this medicine.  If avoiding the allergen or the medicine prescribed do not work, there are many new medicines your health care provider can prescribe. Stronger medicine may be used if initial measures are ineffective. Desensitizing injections can be used if medicine and avoidance does not work. Desensitization is when a patient is given ongoing shots until the body becomes less sensitive to the allergen.  Make sure you follow up with your health care provider if problems continue. HOME CARE INSTRUCTIONS It is not possible to completely avoid allergens, but you can reduce your symptoms by taking steps to limit your exposure to them. It helps to know exactly what you are allergic to so that you can avoid your specific triggers. SEEK MEDICAL CARE IF:   You have a fever.  You develop a cough that does not stop easily (persistent).  You have shortness of breath.  You start wheezing.  Symptoms interfere with normal daily activities. Document Released: 06/15/2001 Document Revised: 07/11/2013 Document Reviewed: 05/28/2013 [employer] Patient Information 2014 [employer].  

## 2014-01-14 ENCOUNTER — Other Ambulatory Visit: Payer: Self-pay | Admitting: *Deleted

## 2014-01-14 MED ORDER — LUBIPROSTONE 24 MCG PO CAPS: 24.0000 ug | ORAL_CAPSULE | Freq: Two times a day (BID) | ORAL | Status: DC

## 2014-01-21 ENCOUNTER — Ambulatory Visit (INDEPENDENT_AMBULATORY_CARE_PROVIDER_SITE_OTHER): Payer: Medicare Other | Admitting: Family Medicine

## 2014-01-21 ENCOUNTER — Ambulatory Visit (INDEPENDENT_AMBULATORY_CARE_PROVIDER_SITE_OTHER): Payer: Medicare Other

## 2014-01-21 ENCOUNTER — Encounter: Payer: Self-pay | Admitting: Family Medicine

## 2014-01-21 VITALS — BP 122/59 | HR 64 | Temp 98.5°F | Ht 63.0 in | Wt 174.2 lb

## 2014-01-21 DIAGNOSIS — J302 Other seasonal allergic rhinitis: Secondary | ICD-10-CM | POA: Insufficient documentation

## 2014-01-21 DIAGNOSIS — J309 Allergic rhinitis, unspecified: Secondary | ICD-10-CM

## 2014-01-21 DIAGNOSIS — R059 Cough, unspecified: Secondary | ICD-10-CM

## 2014-01-21 DIAGNOSIS — R05 Cough: Secondary | ICD-10-CM | POA: Insufficient documentation

## 2014-01-21 DIAGNOSIS — R0989 Other specified symptoms and signs involving the circulatory and respiratory systems: Secondary | ICD-10-CM

## 2014-01-21 DIAGNOSIS — R062 Wheezing: Secondary | ICD-10-CM

## 2014-01-21 DIAGNOSIS — I2510 Atherosclerotic heart disease of native coronary artery without angina pectoris: Secondary | ICD-10-CM

## 2014-01-21 MED ORDER — BUDESONIDE-FORMOTEROL FUMARATE 160-4.5 MCG/ACT IN AERO: 2.0000 | INHALATION_SPRAY | Freq: Two times a day (BID) | RESPIRATORY_TRACT | Status: DC

## 2014-01-21 NOTE — Progress Notes (Signed)
Patient ID: Kristina Gibbs, female   DOB: [birth year], 68 y.o.   MRN: [id number] SUBJECTIVE: CC: Chief Complaint  Patient presents with  . Acute Visit    COUGH CONGESTION. SAW MMM ON 01-12-14 FOR 'ALLERGIES"    HPI: Acute Bronchitis Patient presents for evaluation of nonproductive cough and sneezing. Symptoms began 3 weeks ago and are gradually worsening since that time. Past history is significant for occasional episodes of bronchitis Started 3 weeks ago as allergies. Then got worse and wheezing at nights when she lies down . Has to sit up due to coughing and wheezing. Had  Stress test recently/lexiscan. Was negative. No chest pain. No pedal edema. No exertional dyspna nor angina now. .  Past Medical History  Diagnosis Date  . Hypercholesteremia   . Memory impairment      MMSE 27/30  . Cancer     ovarian  . Arthritis   . Allergy   . Osteopenia   . Coronary artery disease   . Hypertension    Past Surgical History  Procedure Laterality Date  . Coronary artery bypass graft    . Abdominal hysterectomy     History   Social History  . Marital Status: Married    Spouse Name: N/A    Number of Children: N/A  . Years of Education: N/A   Occupational History  . Not on file.   Social History Main Topics  . Smoking status: Former Research scientist (life sciences)  . Smokeless tobacco: Not on file  . Alcohol Use: 0.6 oz/week    1 Glasses of wine per week  . Drug Use: No  . Sexual Activity:    Other Topics Concern  . Not on file   Social History Narrative  . No narrative on file   No family history on file. Current Outpatient Prescriptions on File Prior to Visit  Medication Sig Dispense Refill  . alendronate (FOSAMAX) 70 MG tablet TAKE ONE TABLET BY MOUTH EVERY WEEK  4 tablet  5  . aspirin EC 81 MG tablet Take 81 mg by mouth daily.      Marland Kitchen b complex vitamins tablet Take 2 tablets by mouth daily.      . calcium carbonate (OS-CAL) 600 MG TABS Take 600 mg by mouth daily.      . cholecalciferol  (VITAMIN D) 1000 UNITS tablet Take 1,000 Units by mouth daily.      . clopidogrel (PLAVIX) 75 MG tablet TAKE 1 TABLET EVERY DAY  30 tablet  5  . CO ENZYME Q-10 PO Take 1 tablet by mouth daily.      . CRESTOR 20 MG tablet TAKE 1 TABLET BY MOUTH DAILY  30 tablet  11  . Docusate Calcium (SURFAK PO) Take 1 tablet by mouth daily.      . fluticasone (FLONASE) 50 MCG/ACT nasal spray Place 2 sprays into both nostrils daily.  16 g  6  . irbesartan (AVAPRO) 75 MG tablet TAKE 1 TABLET (75 MG TOTAL) BY MOUTH AT BEDTIME.  30 tablet  8  . isosorbide mononitrate (IMDUR) 60 MG 24 hr tablet TAKE 1 TABLE DAILY  30 tablet  11  . lubiprostone (AMITIZA) 24 MCG capsule Take 1 capsule (24 mcg total) by mouth 2 (two) times daily with a meal. Prn  60 capsule  2  . metoprolol tartrate (LOPRESSOR) 25 MG tablet Take 12.5 mg by mouth daily.      . nitroGLYCERIN (NITROLINGUAL) 0.4 MG/SPRAY spray Place 1 spray under the tongue every 5 (five)  minutes as needed for chest pain.  12 g  2  . omega-3 acid ethyl esters (LOVAZA) 1 G capsule TAKE 2 CAPSULES BY MOUTH 2 (TWO) TIMES DAILY.  120 capsule  4  . pantoprazole (PROTONIX) 40 MG tablet Take 1 tablet (40 mg total) by mouth daily.  30 tablet  6  . ranolazine (RANEXA) 500 MG 12 hr tablet Take 1 tablet (500 mg total) by mouth 2 (two) times daily.  60 tablet  6  . triamterene-hydrochlorothiazide (DYAZIDE) 37.5-25 MG per capsule Take 1 capsule by mouth daily as needed. For fluid retention.      . vitamin C (ASCORBIC ACID) 500 MG tablet Take 1,500 mg by mouth daily.       No current facility-administered medications on file prior to visit.   Allergies  Allergen Reactions  . Codeine   . Hydrocortisone     Hydrocortisone cream makes her itch   Immunization History  Administered Date(s) Administered  . Influenza,inj,Quad PF,36+ Mos 07/30/2013   Prior to Admission medications   Medication Sig Start Date End Date Taking? Authorizing Provider  alendronate (FOSAMAX) 70 MG tablet  TAKE ONE TABLET BY MOUTH EVERY WEEK 11/10/13   Vernie Shanks, MD  aspirin EC 81 MG tablet Take 81 mg by mouth daily.    Historical Provider, MD  b complex vitamins tablet Take 2 tablets by mouth daily.    Historical Provider, MD  calcium carbonate (OS-CAL) 600 MG TABS Take 600 mg by mouth daily.    Historical Provider, MD  cholecalciferol (VITAMIN D) 1000 UNITS tablet Take 1,000 Units by mouth daily.    Historical Provider, MD  clopidogrel (PLAVIX) 75 MG tablet TAKE 1 TABLET EVERY DAY 08/25/13   [name], MD  CO ENZYME Q-10 PO Take 1 tablet by mouth daily.    Historical Provider, MD  CRESTOR 20 MG tablet TAKE 1 TABLET BY MOUTH DAILY 10/01/13   [name], MD  Docusate Calcium (SURFAK PO) Take 1 tablet by mouth daily.    Historical Provider, MD  fluticasone (FLONASE) 50 MCG/ACT nasal spray Place 2 sprays into both nostrils daily. 01/12/14   Mary-Margaret Hassell Done, FNP  irbesartan (AVAPRO) 75 MG tablet TAKE 1 TABLET (75 MG TOTAL) BY MOUTH AT BEDTIME. 12/07/13   [name], MD  isosorbide mononitrate (IMDUR) 60 MG 24 hr tablet TAKE 1 TABLE DAILY 10/02/13   [name], MD  lubiprostone (AMITIZA) 24 MCG capsule Take 1 capsule (24 mcg total) by mouth 2 (two) times daily with a meal. Prn 01/14/14   Vernie Shanks, MD  metoprolol tartrate (LOPRESSOR) 25 MG tablet Take 12.5 mg by mouth daily.    Historical Provider, MD  nitroGLYCERIN (NITROLINGUAL) 0.4 MG/SPRAY spray Place 1 spray under the tongue every 5 (five) minutes as needed for chest pain. 10/25/13   [name], MD  omega-3 acid ethyl esters (LOVAZA) 1 G capsule TAKE 2 CAPSULES BY MOUTH 2 (TWO) TIMES DAILY. 01/11/14   Vernie Shanks, MD  pantoprazole (PROTONIX) 40 MG tablet Take 1 tablet (40 mg total) by mouth daily. 01/04/14   [name], MD  ranolazine (RANEXA) 500 MG 12 hr tablet Take 1 tablet (500 mg total) by mouth 2 (two) times daily. 11/13/13   [name], MD  triamterene-hydrochlorothiazide (DYAZIDE) 37.5-25 MG  per capsule Take 1 capsule by mouth daily as needed. For fluid retention.    Historical Provider, MD  vitamin C (ASCORBIC ACID) 500 MG tablet Take 1,500  mg by mouth daily.    Historical Provider, MD     ROS: As above in the HPI. All other systems are stable or negative.  OBJECTIVE: APPEARANCE:  Patient in no acute distress.The patient appeared well nourished and normally developed. Acyanotic. Waist: VITAL SIGNS:BP 122/59  Pulse 64  Temp(Src) 98.5 F (36.9 C) (Oral)  Ht 5\' 3"  (1.6 m)  Wt 174 lb 3.2 oz (79.017 kg)  BMI 30.87 kg/m2  SpO2 98%  WF obese  SKIN: warm and  Dry without overt rashes, tattoos and scars  HEAD and Neck: without JVD, Head and scalp: normal Eyes:No scleral icterus. Fundi normal, eye movements normal. Ears: Auricle normal, canal normal, Tympanic membranes normal, insufflation normal. Nose: nasal congestion Throat: normal Neck & thyroid: normal  CHEST & LUNGS: Chest wall: normal Lungs: Rhonchi and few scatterred expiratory wheezes.   CVS: Reveals the PMI to be normally located. Regular rhythm, First and Second Heart sounds are normal,  absence of murmurs, rubs or gallops. Peripheral vasculature: Radial pulses: normal Dorsal pedis pulses: normal Posterior pulses: normal  ABDOMEN:  Appearance: normal Benign, no organomegaly, no masses, no Abdominal Aortic enlargement. No Guarding , no rebound. No Bruits. Bowel sounds: normal  RECTAL: N/A GU: N/A  EXTREMETIES: nonedematous.  NEUROLOGIC: oriented to time,place and person; nonfocal.  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 Compounded by being an ex-smoker.  PLAN:  Patient to start on allegra OTC Gave patient 2  samples of symbicort. Demonstrated use of the inhalers.   Orders Placed This Encounter  Procedures  . DG Chest 2 View    Standing Status: Future     Number of Occurrences: 1     Standing Expiration Date: 03/24/2015    Order Specific Question:  Reason for Exam (SYMPTOM  OR DIAGNOSIS REQUIRED)    Answer:  cough and congestion, CAD    Order Specific Question:  Preferred imaging location?    Answer:  Internal  [facility] reading (PRIMARY) by  Dr. Jacelyn Grip: sternotomy wires. Chronic changes. Atelectasis in RLL. No acute findings.                                 Meds ordered this encounter  Medications  . budesonide-formoterol (SYMBICORT) 160-4.5 MCG/ACT inhaler    Sig: Inhale 2 puffs into the lungs 2 (two) times daily.    Dispense:  2 Inhaler    Refill:  0   There are no discontinued medications. Return in about 4 weeks (around 02/18/2014) for Recheck medical problems. with a new provider.  Rynlee Lisbon P. Jacelyn Grip, M.D.

## 2014-02-11 ENCOUNTER — Other Ambulatory Visit: Payer: Self-pay

## 2014-02-11 MED ORDER — CLOPIDOGREL BISULFATE 75 MG PO TABS: 75.0000 mg | ORAL_TABLET | Freq: Every day | ORAL | Status: DC

## 2014-02-11 NOTE — Telephone Encounter (Signed)
Rx was sent to pharmacy electronically. 

## 2014-02-18 ENCOUNTER — Ambulatory Visit (INDEPENDENT_AMBULATORY_CARE_PROVIDER_SITE_OTHER): Payer: Medicare Other | Admitting: Family

## 2014-02-18 ENCOUNTER — Encounter: Payer: Self-pay | Admitting: Family

## 2014-02-18 VITALS — BP 124/60 | HR 60 | Temp 98.2°F | Ht 63.0 in | Wt 175.4 lb

## 2014-02-18 DIAGNOSIS — J329 Chronic sinusitis, unspecified: Secondary | ICD-10-CM

## 2014-02-18 MED ORDER — AMOXICILLIN-POT CLAVULANATE 875-125 MG PO TABS: 1.0000 | ORAL_TABLET | Freq: Two times a day (BID) | ORAL | Status: DC

## 2014-02-18 NOTE — Progress Notes (Signed)
   Subjective:    Patient ID: Kristina Gibbs, female    DOB: 12/28/1945, 68 y.o.   MRN: [id number]  HPI Pt here for follow-up from bronchitis. Pt was seen in office on 4/20 and had chest-ray and started on Symbicort. Pt reports feeling better. States she still has a intermittent productive cough of dark yellow sputum. Pt denies any sore throat, pain, headache, or SOB at this time.    Review of Systems  Respiratory: Positive for cough.   All other systems reviewed and are negative.      Objective:   Physical Exam  Vitals reviewed. Constitutional: She is oriented to person, place, and time. She appears well-developed and well-nourished. No distress.  HENT:  Head: Normocephalic and atraumatic.  Right Ear: External ear normal.  Left Ear: External ear normal.  Nose: Right sinus exhibits maxillary sinus tenderness and frontal sinus tenderness. Left sinus exhibits maxillary sinus tenderness and frontal sinus tenderness.  Mouth/Throat: Oropharynx is clear and moist.  Nasal passage erythemas and mild swelling   Cardiovascular: Normal rate, regular rhythm, normal heart sounds and intact distal pulses.   No murmur heard. Pulmonary/Chest: Effort normal and breath sounds normal. No respiratory distress. She has no wheezes.  Dry cough   Abdominal: Soft. Bowel sounds are normal. She exhibits no distension. There is no tenderness.  Musculoskeletal: Normal range of motion. She exhibits no edema and no tenderness.  Neurological: She is alert and oriented to person, place, and time.  Skin: Skin is warm and dry.  Psychiatric: She has a normal mood and affect. Her behavior is normal. Judgment and thought content normal.     BP 124/60  Pulse 60  Temp(Src) 98.2 F (36.8 C) (Oral)  Ht 5\' 3"  (1.6 m)  Wt 175 lb 6.4 oz (79.561 kg)  BMI 31.08 kg/m2      Assessment & Plan:  1. Sinusitis - Take meds as prescribed - Use a cool mist humidifier  -Use saline nose sprays frequently -Saline  irrigations of the nose can be very helpful if done frequently.  * 4X daily for 1 week*  * Use of a nettie pot can be helpful with this. Follow directions with this* -Force fluids -For any cough or congestion  Use plain Mucinex- regular strength or max strength is fine   * Children- consult with Pharmacist for dosing -For fever or aces or pains- take tylenol or ibuprofen appropriate for age and weight.  * for fevers greater than 101 orally you may alternate ibuprofen and tylenol every  3 hours. -Throat lozenges if help - Meds ordered this encounter  Medications  . amoxicillin-clavulanate (AUGMENTIN) 875-125 MG per tablet    Sig: Take 1 tablet by mouth 2 (two) times daily.    Dispense:  14 tablet    Refill:  0    Order Specific Question:  Supervising Provider    Answer:  Chipper Herb [[id number]]      Evelina Dun, FNP

## 2014-02-18 NOTE — Patient Instructions (Signed)
Sinusitis Sinusitis is redness, soreness, and swelling (inflammation) of the paranasal sinuses. Paranasal sinuses are air pockets within the bones of your face (beneath the eyes, the middle of the forehead, or above the eyes). In healthy paranasal sinuses, mucus is able to drain out, and air is able to circulate through them by way of your nose. However, when your paranasal sinuses are inflamed, mucus and air can become trapped. This can allow bacteria and other germs to grow and cause infection. Sinusitis can develop quickly and last only a short time (acute) or continue over a long period (chronic). Sinusitis that lasts for more than 12 weeks is considered chronic.  CAUSES  Causes of sinusitis include:  Allergies.  Structural abnormalities, such as displacement of the cartilage that separates your nostrils (deviated septum), which can decrease the air flow through your nose and sinuses and affect sinus drainage.  Functional abnormalities, such as when the small hairs (cilia) that line your sinuses and help remove mucus do not work properly or are not present. SYMPTOMS  Symptoms of acute and chronic sinusitis are the same. The primary symptoms are pain and pressure around the affected sinuses. Other symptoms include:  Upper toothache.  Earache.  Headache.  Bad breath.  Decreased sense of smell and taste.  A cough, which worsens when you are lying flat.  Fatigue.  Fever.  Thick drainage from your nose, which often is green and may contain pus (purulent).  Swelling and warmth over the affected sinuses. DIAGNOSIS  Your caregiver will perform a physical exam. During the exam, your caregiver may:  Look in your nose for signs of abnormal growths in your nostrils (nasal polyps).  Tap over the affected sinus to check for signs of infection.  View the inside of your sinuses (endoscopy) with a special imaging device with a light attached (endoscope), which is inserted into your  sinuses. If your caregiver suspects that you have chronic sinusitis, one or more of the following tests may be recommended:  Allergy tests.  Nasal culture A sample of mucus is taken from your nose and sent to a lab and screened for bacteria.  Nasal cytology A sample of mucus is taken from your nose and examined by your caregiver to determine if your sinusitis is related to an allergy. TREATMENT  Most cases of acute sinusitis are related to a viral infection and will resolve on their own within 10 days. Sometimes medicines are prescribed to help relieve symptoms (pain medicine, decongestants, nasal steroid sprays, or saline sprays).  However, for sinusitis related to a bacterial infection, your caregiver will prescribe antibiotic medicines. These are medicines that will help kill the bacteria causing the infection.  Rarely, sinusitis is caused by a fungal infection. In theses cases, your caregiver will prescribe antifungal medicine. For some cases of chronic sinusitis, surgery is needed. Generally, these are cases in which sinusitis recurs more than 3 times per year, despite other treatments. HOME CARE INSTRUCTIONS   Drink plenty of water. Water helps thin the mucus so your sinuses can drain more easily.  Use a humidifier.  Inhale steam 3 to 4 times a day (for example, sit in the bathroom with the shower running).  Apply a warm, moist washcloth to your face 3 to 4 times a day, or as directed by your caregiver.  Use saline nasal sprays to help moisten and clean your sinuses.  Take over-the-counter or prescription medicines for pain, discomfort, or fever only as directed by your caregiver. SEEK IMMEDIATE MEDICAL   CARE IF:  You have increasing pain or severe headaches.  You have nausea, vomiting, or drowsiness.  You have swelling around your face.  You have vision problems.  You have a stiff neck.  You have difficulty breathing. MAKE SURE YOU:   Understand these  instructions.  Will watch your condition.  Will get help right away if you are not doing well or get worse. Document Released: 09/20/2005 Document Revised: 12/13/2011 Document Reviewed: 10/05/2011 ExitCare Patient Information 2014 [place], [place].  - Take meds as prescribed - Use a cool mist humidifier  -Use saline nose sprays frequently -Saline irrigations of the nose can be very helpful if done frequently.  * 4X daily for 1 week*  * Use of a nettie pot can be helpful with this. Follow directions with this* -Force fluids -For any cough or congestion  Use plain Mucinex- regular strength or max strength is fine   * Children- consult with Pharmacist for dosing -For fever or aces or pains- take tylenol or ibuprofen appropriate for age and weight.  * for fevers greater than 101 orally you may alternate ibuprofen and tylenol every  3 hours. -Throat lozenges if help -Daily Claritin   Evelina Dun, FNP

## 2014-02-20 ENCOUNTER — Other Ambulatory Visit: Payer: Medicare Other

## 2014-02-20 DIAGNOSIS — E785 Hyperlipidemia, unspecified: Secondary | ICD-10-CM

## 2014-02-20 DIAGNOSIS — K219 Gastro-esophageal reflux disease without esophagitis: Secondary | ICD-10-CM

## 2014-02-20 LAB — POCT CBC
Granulocyte percent: 65.6 %G (ref 37–80)
HEMATOCRIT: 38.3 % (ref 37.7–47.9)
Hemoglobin: 12.6 g/dL (ref 12.2–16.2)
LYMPH, POC: 1.2 (ref 0.6–3.4)
MCH: 29.3 pg (ref 27–31.2)
MCHC: 32.8 g/dL (ref 31.8–35.4)
MCV: 89.2 fL (ref 80–97)
MPV: 6.3 fL (ref 0–99.8)
POC Granulocyte: 2.7 (ref 2–6.9)
POC LYMPH PERCENT: 29.6 %L (ref 10–50)
Platelet Count, POC: 218 10*3/uL (ref 142–424)
RBC: 4.3 M/uL (ref 4.04–5.48)
RDW, POC: 13.4 %
WBC: 4.1 10*3/uL — AB (ref 4.6–10.2)

## 2014-02-20 NOTE — Progress Notes (Signed)
Pt came in for labs only 

## 2014-02-21 LAB — LIPID PANEL
Chol/HDL Ratio: 2.4 ratio units (ref 0.0–4.4)
Cholesterol, Total: 140 mg/dL (ref 100–199)
HDL: 59 mg/dL (ref >39)
LDL Calculated: 63 mg/dL (ref 0–99)
Triglycerides: 92 mg/dL (ref 0–149)
VLDL Cholesterol Cal: 18 mg/dL (ref 5–40)

## 2014-02-21 LAB — CMP14+EGFR
A/G RATIO: 1.7 (ref 1.1–2.5)
ALK PHOS: 55 IU/L (ref 39–117)
ALT: 22 IU/L (ref 0–32)
AST: 15 IU/L (ref 0–40)
Albumin: 4 g/dL (ref 3.6–4.8)
BUN/Creatinine Ratio: 14 (ref 11–26)
BUN: 15 mg/dL (ref 8–27)
CO2: 24 mmol/L (ref 18–29)
Calcium: 9.1 mg/dL (ref 8.7–10.3)
Chloride: 101 mmol/L (ref 97–108)
Creatinine, Ser: 1.09 mg/dL — ABNORMAL HIGH (ref 0.57–1.00)
GFR, EST AFRICAN AMERICAN: 60 mL/min/{1.73_m2} (ref >59)
GFR, EST NON AFRICAN AMERICAN: 52 mL/min/{1.73_m2} — AB (ref >59)
GLOBULIN, TOTAL: 2.3 g/dL (ref 1.5–4.5)
Glucose: 95 mg/dL (ref 65–99)
Potassium: 4.7 mmol/L (ref 3.5–5.2)
SODIUM: 137 mmol/L (ref 134–144)
Total Bilirubin: 0.5 mg/dL (ref 0.0–1.2)
Total Protein: 6.3 g/dL (ref 6.0–8.5)

## 2014-02-22 ENCOUNTER — Encounter: Payer: Self-pay | Admitting: Family

## 2014-02-22 ENCOUNTER — Ambulatory Visit (INDEPENDENT_AMBULATORY_CARE_PROVIDER_SITE_OTHER): Payer: Medicare Other | Admitting: Family

## 2014-02-22 VITALS — BP 129/49 | HR 57 | Temp 97.3°F | Ht 63.0 in | Wt 175.2 lb

## 2014-02-22 DIAGNOSIS — Z01419 Encounter for gynecological examination (general) (routine) without abnormal findings: Secondary | ICD-10-CM

## 2014-02-22 DIAGNOSIS — Z124 Encounter for screening for malignant neoplasm of cervix: Secondary | ICD-10-CM

## 2014-02-22 DIAGNOSIS — Z0000 Encounter for general adult medical examination without abnormal findings: Secondary | ICD-10-CM

## 2014-02-22 DIAGNOSIS — I10 Essential (primary) hypertension: Secondary | ICD-10-CM | POA: Insufficient documentation

## 2014-02-22 NOTE — Addendum Note (Signed)
Addended by: Earlene Plater on: 02/22/2014 10:43 AM   Modules accepted: Orders

## 2014-02-22 NOTE — Progress Notes (Signed)
   Subjective:    Patient ID: Kristina Gibbs, female    DOB: [birth year], 68 y.o.   MRN: [id number]  Gynecologic Exam   Pt here for a PE with pap. Pt hs several chronic conditions that include: Patient Active Problem List   Diagnosis Date Noted  . Hypertension 02/22/2014  . Chest congestion 01/21/2014  . Wheeze 01/21/2014  . Cough 01/21/2014  . Seasonal allergic rhinitis 01/21/2014  . Angina at rest 12/28/2013  . Epistaxis 11/12/2013  . Breast lump in upper outer quadrant 04/17/2013  . Memory impairment 04/17/2013  . Pruritus 04/17/2013  . Chest pain 06/05/2012  . HLD (hyperlipidemia) 06/05/2012  . 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 HEMORRHOIDS-INTERNAL 08/31/2010  . GERD 08/31/2010  . CONSTIPATION 08/31/2010  . RECTAL BLEEDING 08/31/2010   Pt states her cholesterol, blood pressure, and GERD being controlled.States she takes her blood pressure at home and states it is "good". (avg 120s/60s). Pt's only complaint at this time is allergies. Pt currently talking antibiotic for a sinus infection. Pt states it is improving since starting her antibiotic, mucinex, and Claritin. States she is still having a lot of drainage. Pt denies any pain or any other concerns.    Review of Systems  HENT: Positive for postnasal drip and rhinorrhea.   Respiratory: Positive for cough.   All other systems reviewed and are negative.      Objective:   Physical Exam  Vitals reviewed. Constitutional: She is oriented to person, place, and time. She appears well-developed and well-nourished. No distress.  HENT:  Head: Normocephalic and atraumatic.  Right Ear: External ear normal.  Left Ear: External ear normal.  Mouth/Throat: Oropharynx is clear and moist.  Eyes: Pupils are equal, round, and reactive to light.  Neck: Normal range of motion. Neck supple. No thyromegaly present.  Cardiovascular: Normal rate, regular rhythm, normal heart  sounds and intact distal pulses.   No murmur heard. Pulmonary/Chest: Effort normal and breath sounds normal. No respiratory distress. She has no wheezes. Right breast exhibits tenderness. Right breast exhibits no inverted nipple, no mass, no nipple discharge and no skin change. Left breast exhibits tenderness. Left breast exhibits no inverted nipple, no mass, no nipple discharge and no skin change. Breasts are symmetrical.  Abdominal: Soft. Bowel sounds are normal. She exhibits no distension. There is no tenderness.  Genitourinary: Vagina normal and uterus normal. No vaginal discharge found.  Musculoskeletal: Normal range of motion. She exhibits no edema and no tenderness.  Neurological: She is alert and oriented to person, place, and time. She has normal reflexes. No cranial nerve deficit.  Skin: Skin is warm and dry.  Psychiatric: She has a normal mood and affect. Her behavior is normal. Judgment and thought content normal.      BP 129/49  Pulse 57  Temp(Src) 97.3 F (36.3 C) (Oral)  Ht 5\' 3"  (1.6 m)  Wt 175 lb 3.2 oz (79.47 kg)  BMI 31.04 kg/m2     Assessment & Plan:  1. Hypertension  2. Encounter for routine gynecological examination - Pap IG w/ reflex to HPV when ASC-U  3. Annual physical exam   Continue all meds Discussed Lab result Health Maintenance reviewed Diet and exercise encouraged RTO 3 months   Evelina Dun, FNP

## 2014-02-22 NOTE — Patient Instructions (Signed)

## 2014-02-27 LAB — PAP IG W/ RFLX HPV ASCU: PAP Smear Comment: 0

## 2014-03-07 ENCOUNTER — Telehealth: Payer: Self-pay | Admitting: Family

## 2014-03-07 MED ORDER — DOXYCYCLINE HYCLATE 100 MG PO TABS: 200.0000 mg | ORAL_TABLET | Freq: Every day | ORAL | Status: DC

## 2014-03-07 NOTE — Telephone Encounter (Signed)
Advise

## 2014-03-12 ENCOUNTER — Other Ambulatory Visit: Payer: Self-pay | Admitting: Family Medicine

## 2014-03-12 ENCOUNTER — Ambulatory Visit (INDEPENDENT_AMBULATORY_CARE_PROVIDER_SITE_OTHER): Payer: Medicare Other | Admitting: Cardiovascular Disease

## 2014-03-12 ENCOUNTER — Encounter: Payer: Self-pay | Admitting: Cardiovascular Disease

## 2014-03-12 VITALS — BP 143/66 | HR 63 | Ht 63.0 in | Wt 173.0 lb

## 2014-03-12 DIAGNOSIS — E785 Hyperlipidemia, unspecified: Secondary | ICD-10-CM

## 2014-03-12 DIAGNOSIS — N6009 Solitary cyst of unspecified breast: Secondary | ICD-10-CM

## 2014-03-12 DIAGNOSIS — I2510 Atherosclerotic heart disease of native coronary artery without angina pectoris: Secondary | ICD-10-CM

## 2014-03-12 DIAGNOSIS — I10 Essential (primary) hypertension: Secondary | ICD-10-CM

## 2014-03-12 NOTE — Assessment & Plan Note (Signed)
Controlled on current medications 

## 2014-03-12 NOTE — Progress Notes (Signed)
03/12/2014 Kristina Gibbs   Feb 26, 1946  [id number]  Primary Physician [name], MD Primary Cardiologist: [name] MD Renae Gloss   HPI:  Kristina Gibbs is a 68 year old mildly overweight Caucasian female with a history of CAD previously taken care of by Kristina Gibbs. She has a history of anterior wall myocardial infarction in 1995 with subsequent percutaneous sutures to her RCA and LAD. She'll probably require multivessel bypass surgery by Kristina Gibbs in March of 2000 and LIMA to LAD, vein to a marginal branch of the circumflex and distal right coronary artery. Problems included hypertension and hyperlipidemia. I performed cardiac catheterization on her/2/10 revealing occluded vein grafts, patent LIMA and normal LV function. She recently saw Kristina Gibbs in the office for chest pain. A Myoview stress test was normal. Her pain suddenly resolved and she should do this to anxiety.   Current Outpatient Prescriptions  Medication Sig Dispense Refill  . alendronate (FOSAMAX) 70 MG tablet TAKE ONE TABLET BY MOUTH EVERY WEEK  4 tablet  5  . amoxicillin-clavulanate (AUGMENTIN) 875-125 MG per tablet Take 1 tablet by mouth 2 (two) times daily.  14 tablet  0  . aspirin EC 81 MG tablet Take 81 mg by mouth daily.      Marland Kitchen b complex vitamins tablet Take 2 tablets by mouth daily.      . calcium carbonate (OS-CAL) 600 MG TABS Take 600 mg by mouth daily.      . cholecalciferol (VITAMIN D) 1000 UNITS tablet Take 1,000 Units by mouth daily.      . clopidogrel (PLAVIX) 75 MG tablet Take 1 tablet (75 mg total) by mouth daily.  30 tablet  11  . CO ENZYME Q-10 PO Take 1 tablet by mouth daily.      . CRESTOR 20 MG tablet TAKE 1 TABLET BY MOUTH DAILY  30 tablet  11  . Docusate Calcium (SURFAK PO) Take 1 tablet by mouth daily.      Marland Kitchen doxycycline (VIBRA-TABS) 100 MG tablet Take 2 tablets (200 mg total) by mouth daily.  28 tablet  0  . fluticasone (FLONASE) 50 MCG/ACT nasal spray Place 2  sprays into both nostrils daily.  16 g  6  . irbesartan (AVAPRO) 75 MG tablet TAKE 1 TABLET (75 MG TOTAL) BY MOUTH AT BEDTIME.  30 tablet  8  . isosorbide mononitrate (IMDUR) 60 MG 24 hr tablet TAKE 1 TABLE DAILY  30 tablet  11  . lubiprostone (AMITIZA) 24 MCG capsule Take 1 capsule (24 mcg total) by mouth 2 (two) times daily with a meal. Prn  60 capsule  2  . metoprolol tartrate (LOPRESSOR) 25 MG tablet Take 12.5 mg by mouth daily.      . nitroGLYCERIN (NITROLINGUAL) 0.4 MG/SPRAY spray Place 1 spray under the tongue every 5 (five) minutes as needed for chest pain.  12 g  2  . omega-3 acid ethyl esters (LOVAZA) 1 G capsule TAKE 2 CAPSULES BY MOUTH 2 (TWO) TIMES DAILY.  120 capsule  4  . pantoprazole (PROTONIX) 40 MG tablet Take 1 tablet (40 mg total) by mouth daily.  30 tablet  6  . ranolazine (RANEXA) 500 MG 12 hr tablet Take 1 tablet (500 mg total) by mouth 2 (two) times daily.  60 tablet  6  . triamterene-hydrochlorothiazide (DYAZIDE) 37.5-25 MG per capsule Take 1 capsule by mouth daily as needed. For fluid retention.      . vitamin C (ASCORBIC ACID) 500 MG tablet Take  1,500 mg by mouth daily.       No current facility-administered medications for this visit.    Allergies  Allergen Reactions  . Codeine   . Hydrocortisone     Hydrocortisone cream makes her itch    History   Social History  . Marital Status: Married    Spouse Name: N/A    Number of Children: N/A  . Years of Education: N/A   Occupational History  . Not on file.   Social History Main Topics  . Smoking status: Former Research scientist (life sciences)  . Smokeless tobacco: Not on file  . Alcohol Use: 0.6 oz/week    1 Glasses of wine per week  . Drug Use: No  . Sexual Activity: Not on file   Other Topics Concern  . Not on file   Social History Narrative  . No narrative on file     Review of Systems: General: negative for chills, fever, night sweats or weight changes.  Cardiovascular: negative for chest pain, dyspnea on exertion,  edema, orthopnea, palpitations, paroxysmal nocturnal dyspnea or shortness of breath Dermatological: negative for rash Respiratory: negative for cough or wheezing Urologic: negative for hematuria Abdominal: negative for nausea, vomiting, diarrhea, bright red blood per rectum, melena, or hematemesis Neurologic: negative for visual changes, syncope, or dizziness All other systems reviewed and are otherwise negative except as noted above.    Blood pressure 143/66, pulse 63, height 5\' 3"  (1.6 m), weight 173 lb (78.472 kg).  General appearance: alert and no distress Neck: no adenopathy, no carotid bruit, no JVD, supple, symmetrical, trachea midline and thyroid not enlarged, symmetric, no tenderness/mass/nodules Lungs: clear to auscultation bilaterally Heart: regular rate and rhythm, S1, S2 normal, no murmur, click, rub or gallop Extremities: extremities normal, atraumatic, no cyanosis or edema  EKG not performed today  ASSESSMENT AND PLAN:   CORONARY ARTERY DISEASE, with CABG 2010 and graft occl.at aorta and atretic LIMA History of CAD dating back to 1995 when she had an anterior wall myocardial infarctiontreated by angioplasty and [facility]. She's had stenting of her RCA redilatation of her LAD. She ultimately underwent coronary artery bypass grafting by Kristina Gibbs in March 2010 with a vein to obtuse marginal branch, vein to the PDA and a LIMA to the LAD. I performed cardiac catheterization on her 09/04/09 revealing 60% ostial left main with damping, patent LIMA to the LAD, patent stent to the mid RCA with a 50% distal PDA and PLA disease and occluded vein graft to the RCA and circumflex coronary arteries.because of recurrent chest pain she had a Myoview stress test that was performed on 01/01/14 which was nonischemic. She saw Kristina Gibbs back in the office on 01/10/14 which time her symptoms had improved. She did hit her chest pain to anxiety.  HLD (hyperlipidemia) On statin therapy  with recent lipid profile during a HDL of 63 and HDL of 59.  Hypertension Controlled on current medications      [name] MD [facility], [facility] 03/12/2014 11:22 AM

## 2014-03-12 NOTE — Assessment & Plan Note (Signed)
History of CAD dating back to 1995 when she had an anterior wall myocardial infarctiontreated by angioplasty and [facility]. She's had stenting of her RCA redilatation of her LAD. She ultimately underwent coronary artery bypass grafting by Dr. Gorden Harms in March 2010 with a vein to obtuse marginal branch, vein to the PDA and a LIMA to the LAD. I performed cardiac catheterization on her 09/04/09 revealing 60% ostial left main with damping, patent LIMA to the LAD, patent stent to the mid RCA with a 50% distal PDA and PLA disease and occluded vein graft to the RCA and circumflex coronary arteries.because of recurrent chest pain she had a Myoview stress test that was performed on 01/01/14 which was nonischemic. She saw [name] back in the office on 01/10/14 which time her symptoms had improved. She did hit her chest pain to anxiety.

## 2014-03-12 NOTE — Assessment & Plan Note (Signed)
On statin therapy with recent lipid profile during a HDL of 63 and HDL of 59.

## 2014-03-12 NOTE — Patient Instructions (Signed)
Your physician recommends that you schedule a follow-up appointment in: 6 Months with Mickel Baas and 12 Months with Dr Gwenlyn Found'

## 2014-03-28 ENCOUNTER — Other Ambulatory Visit: Payer: Medicare Other

## 2014-04-03 ENCOUNTER — Telehealth: Payer: Self-pay | Admitting: Family Medicine

## 2014-04-03 NOTE — Telephone Encounter (Signed)
appt given for tomorrow with christy

## 2014-04-04 ENCOUNTER — Ambulatory Visit: Payer: Medicare Other | Admitting: Family

## 2014-04-11 NOTE — Telephone Encounter (Signed)
Encounter complete. 

## 2014-04-25 ENCOUNTER — Other Ambulatory Visit: Payer: Self-pay | Admitting: Family Medicine

## 2014-04-25 DIAGNOSIS — N6009 Solitary cyst of unspecified breast: Secondary | ICD-10-CM

## 2014-04-29 ENCOUNTER — Ambulatory Visit
Admission: RE | Admit: 2014-04-29 | Discharge: 2014-04-29 | Disposition: A | Discharge status: Home / Self Care | Payer: Medicare Other | Source: Ambulatory Visit | Attending: Family Medicine | Admitting: Family Medicine

## 2014-04-29 ENCOUNTER — Encounter (INDEPENDENT_AMBULATORY_CARE_PROVIDER_SITE_OTHER): Payer: Self-pay

## 2014-04-29 DIAGNOSIS — N6009 Solitary cyst of unspecified breast: Secondary | ICD-10-CM

## 2014-05-26 ENCOUNTER — Emergency Department (HOSPITAL_COMMUNITY): Payer: Medicare Other

## 2014-05-26 ENCOUNTER — Encounter (HOSPITAL_COMMUNITY): Payer: Self-pay | Admitting: Emergency Medicine

## 2014-05-26 ENCOUNTER — Observation Stay (HOSPITAL_COMMUNITY)
Admission: EM | Admit: 2014-05-26 | Discharge: 2014-05-27 | Disposition: A | Discharge status: Home / Self Care | Payer: Medicare Other | Attending: Internal Medicine | Admitting: Internal Medicine

## 2014-05-26 DIAGNOSIS — Z6830 Body mass index (BMI) 30.0-30.9, adult: Secondary | ICD-10-CM | POA: Insufficient documentation

## 2014-05-26 DIAGNOSIS — Z7983 Long term (current) use of bisphosphonates: Secondary | ICD-10-CM | POA: Diagnosis not present

## 2014-05-26 DIAGNOSIS — K219 Gastro-esophageal reflux disease without esophagitis: Secondary | ICD-10-CM | POA: Insufficient documentation

## 2014-05-26 DIAGNOSIS — E7800 Pure hypercholesterolemia, unspecified: Secondary | ICD-10-CM | POA: Diagnosis not present

## 2014-05-26 DIAGNOSIS — Z87891 Personal history of nicotine dependence: Secondary | ICD-10-CM | POA: Insufficient documentation

## 2014-05-26 DIAGNOSIS — I200 Unstable angina: Secondary | ICD-10-CM | POA: Diagnosis not present

## 2014-05-26 DIAGNOSIS — E663 Overweight: Secondary | ICD-10-CM | POA: Diagnosis not present

## 2014-05-26 DIAGNOSIS — M949 Disorder of cartilage, unspecified: Secondary | ICD-10-CM

## 2014-05-26 DIAGNOSIS — Z7902 Long term (current) use of antithrombotics/antiplatelets: Secondary | ICD-10-CM | POA: Diagnosis not present

## 2014-05-26 DIAGNOSIS — M899 Disorder of bone, unspecified: Secondary | ICD-10-CM | POA: Insufficient documentation

## 2014-05-26 DIAGNOSIS — Z7982 Long term (current) use of aspirin: Secondary | ICD-10-CM | POA: Insufficient documentation

## 2014-05-26 DIAGNOSIS — I25810 Atherosclerosis of coronary artery bypass graft(s) without angina pectoris: Secondary | ICD-10-CM | POA: Diagnosis not present

## 2014-05-26 DIAGNOSIS — Z951 Presence of aortocoronary bypass graft: Secondary | ICD-10-CM | POA: Diagnosis present

## 2014-05-26 DIAGNOSIS — I2510 Atherosclerotic heart disease of native coronary artery without angina pectoris: Secondary | ICD-10-CM

## 2014-05-26 DIAGNOSIS — I2089 Other forms of angina pectoris: Secondary | ICD-10-CM

## 2014-05-26 DIAGNOSIS — I208 Other forms of angina pectoris: Secondary | ICD-10-CM

## 2014-05-26 DIAGNOSIS — Z792 Long term (current) use of antibiotics: Secondary | ICD-10-CM | POA: Insufficient documentation

## 2014-05-26 DIAGNOSIS — E785 Hyperlipidemia, unspecified: Secondary | ICD-10-CM | POA: Diagnosis present

## 2014-05-26 DIAGNOSIS — Z9861 Coronary angioplasty status: Secondary | ICD-10-CM | POA: Diagnosis not present

## 2014-05-26 DIAGNOSIS — I252 Old myocardial infarction: Secondary | ICD-10-CM | POA: Diagnosis not present

## 2014-05-26 DIAGNOSIS — I10 Essential (primary) hypertension: Secondary | ICD-10-CM | POA: Insufficient documentation

## 2014-05-26 DIAGNOSIS — R0789 Other chest pain: Secondary | ICD-10-CM

## 2014-05-26 HISTORY — DX: Unstable angina: I20.0

## 2014-05-26 LAB — BASIC METABOLIC PANEL
Anion gap: 11 (ref 5–15)
BUN: 19 mg/dL (ref 6–23)
CO2: 24 meq/L (ref 19–32)
CREATININE: 1.01 mg/dL (ref 0.50–1.10)
Calcium: 9 mg/dL (ref 8.4–10.5)
Chloride: 98 mEq/L (ref 96–112)
GFR calc non Af Amer: 55 mL/min — ABNORMAL LOW (ref >90)
GFR, EST AFRICAN AMERICAN: 64 mL/min — AB (ref >90)
Glucose, Bld: 89 mg/dL (ref 70–99)
POTASSIUM: 5.7 meq/L — AB (ref 3.7–5.3)
Sodium: 133 mEq/L — ABNORMAL LOW (ref 137–147)

## 2014-05-26 LAB — PROTIME-INR
INR: 0.98 (ref 0.00–1.49)
INR: 1.17 (ref 0.00–1.49)
PROTHROMBIN TIME: 13 s (ref 11.6–15.2)
Prothrombin Time: 14.9 seconds (ref 11.6–15.2)

## 2014-05-26 LAB — TSH: TSH: 2.91 u[IU]/mL (ref 0.350–4.500)

## 2014-05-26 LAB — CBC
HCT: 37.9 % (ref 36.0–46.0)
Hemoglobin: 12.6 g/dL (ref 12.0–15.0)
MCH: 28.9 pg (ref 26.0–34.0)
MCHC: 33.2 g/dL (ref 30.0–36.0)
MCV: 86.9 fL (ref 78.0–100.0)
PLATELETS: 193 10*3/uL (ref 150–400)
RBC: 4.36 MIL/uL (ref 3.87–5.11)
RDW: 13.2 % (ref 11.5–15.5)
WBC: 4.1 10*3/uL (ref 4.0–10.5)

## 2014-05-26 LAB — TROPONIN I: Troponin I: <0.3 ng/mL (ref <0.30)

## 2014-05-26 LAB — I-STAT TROPONIN, ED: Troponin i, poc: 0 ng/mL (ref 0.00–0.08)

## 2014-05-26 LAB — HEPARIN LEVEL (UNFRACTIONATED): Heparin Unfractionated: 0.45 IU/mL (ref 0.30–0.70)

## 2014-05-26 LAB — PRO B NATRIURETIC PEPTIDE: PRO B NATRI PEPTIDE: 185.6 pg/mL — AB (ref 0–125)

## 2014-05-26 MED ORDER — SODIUM CHLORIDE 0.9 % IJ SOLN
3.0000 mL | INTRAMUSCULAR | Status: DC | PRN
Start: 2014-05-26 — End: 2014-05-27

## 2014-05-26 MED ORDER — ISOSORBIDE MONONITRATE ER 60 MG PO TB24
60.0000 mg | ORAL_TABLET | Freq: Every day | ORAL | Status: DC
  Administered 2014-05-26 – 2014-05-27 (×2): 60 mg via ORAL
  Filled 2014-05-26 (×2): qty 1

## 2014-05-26 MED ORDER — SODIUM CHLORIDE 0.9 % IV SOLN: 1.0000 mL/kg/h | INTRAVENOUS | Status: DC

## 2014-05-26 MED ORDER — RANOLAZINE ER 500 MG PO TB12
500.0000 mg | ORAL_TABLET | Freq: Two times a day (BID) | ORAL | Status: DC
  Administered 2014-05-26 – 2014-05-27 (×2): 500 mg via ORAL
  Filled 2014-05-26 (×3): qty 1

## 2014-05-26 MED ORDER — LUBIPROSTONE 24 MCG PO CAPS
24.0000 ug | ORAL_CAPSULE | Freq: Every day | ORAL | Status: DC
  Administered 2014-05-26: 24 ug via ORAL
  Filled 2014-05-26 (×2): qty 1

## 2014-05-26 MED ORDER — SODIUM CHLORIDE 0.9 % IJ SOLN: 3.0000 mL | INTRAMUSCULAR | Status: DC | PRN

## 2014-05-26 MED ORDER — ASPIRIN EC 81 MG PO TBEC
81.0000 mg | DELAYED_RELEASE_TABLET | Freq: Every day | ORAL | Status: DC
  Administered 2014-05-27: 81 mg via ORAL
  Filled 2014-05-26: qty 1

## 2014-05-26 MED ORDER — SODIUM CHLORIDE 0.9 % IJ SOLN
3.0000 mL | Freq: Two times a day (BID) | INTRAMUSCULAR | Status: DC
  Administered 2014-05-26: 3 mL via INTRAVENOUS

## 2014-05-26 MED ORDER — SODIUM CHLORIDE 0.9 % IV SOLN: 250.0000 mL | INTRAVENOUS | Status: DC | PRN

## 2014-05-26 MED ORDER — ALENDRONATE SODIUM 70 MG PO TABS: 70.0000 mg | ORAL_TABLET | ORAL | Status: DC

## 2014-05-26 MED ORDER — ONDANSETRON HCL 4 MG/2ML IJ SOLN: 4.0000 mg | Freq: Four times a day (QID) | INTRAMUSCULAR | Status: DC | PRN

## 2014-05-26 MED ORDER — ASPIRIN 300 MG RE SUPP: 300.0000 mg | RECTAL | Status: AC

## 2014-05-26 MED ORDER — PANTOPRAZOLE SODIUM 40 MG PO TBEC
40.0000 mg | DELAYED_RELEASE_TABLET | Freq: Every day | ORAL | Status: DC
  Administered 2014-05-26 – 2014-05-27 (×2): 40 mg via ORAL
  Filled 2014-05-26 (×2): qty 1

## 2014-05-26 MED ORDER — ACETAMINOPHEN 325 MG PO TABS: 650.0000 mg | ORAL_TABLET | ORAL | Status: DC | PRN

## 2014-05-26 MED ORDER — OMEGA-3-ACID ETHYL ESTERS 1 G PO CAPS
1.0000 g | ORAL_CAPSULE | Freq: Four times a day (QID) | ORAL | Status: DC
  Administered 2014-05-26 – 2014-05-27 (×3): 1 g via ORAL
  Filled 2014-05-26 (×6): qty 1

## 2014-05-26 MED ORDER — HEPARIN (PORCINE) IN NACL 100-0.45 UNIT/ML-% IJ SOLN
950.0000 [IU]/h | INTRAMUSCULAR | Status: DC
  Filled 2014-05-26 (×2): qty 250

## 2014-05-26 MED ORDER — SODIUM CHLORIDE 0.9 % IV BOLUS (SEPSIS): 1000.0000 mL | Freq: Once | INTRAVENOUS | Status: DC

## 2014-05-26 MED ORDER — HEPARIN BOLUS VIA INFUSION
4000.0000 [IU] | Freq: Once | INTRAVENOUS | Status: DC
  Filled 2014-05-26: qty 4000

## 2014-05-26 MED ORDER — IRBESARTAN 75 MG PO TABS
75.0000 mg | ORAL_TABLET | Freq: Every day | ORAL | Status: DC
  Administered 2014-05-26 – 2014-05-27 (×2): 75 mg via ORAL
  Filled 2014-05-26 (×2): qty 1

## 2014-05-26 MED ORDER — ATORVASTATIN CALCIUM 80 MG PO TABS
80.0000 mg | ORAL_TABLET | Freq: Every day | ORAL | Status: DC
  Administered 2014-05-26: 80 mg via ORAL
  Filled 2014-05-26 (×2): qty 1

## 2014-05-26 MED ORDER — METOPROLOL TARTRATE 12.5 MG HALF TABLET
12.5000 mg | ORAL_TABLET | Freq: Every day | ORAL | Status: DC
  Administered 2014-05-27: 12.5 mg via ORAL
  Filled 2014-05-26 (×2): qty 1

## 2014-05-26 MED ORDER — ASPIRIN 81 MG PO CHEW: 324.0000 mg | CHEWABLE_TABLET | ORAL | Status: AC

## 2014-05-26 MED ORDER — NITROGLYCERIN 0.4 MG/SPRAY TL SOLN
1.0000 | Status: DC | PRN
  Filled 2014-05-26: qty 4.9

## 2014-05-26 MED ORDER — CLOPIDOGREL BISULFATE 75 MG PO TABS
75.0000 mg | ORAL_TABLET | Freq: Every day | ORAL | Status: DC
  Administered 2014-05-27: 75 mg via ORAL
  Filled 2014-05-26: qty 1

## 2014-05-26 MED ORDER — SODIUM CHLORIDE 0.9 % IJ SOLN
3.0000 mL | Freq: Two times a day (BID) | INTRAMUSCULAR | Status: DC
Start: 2014-05-26 — End: 2014-05-27
  Administered 2014-05-26: 3 mL via INTRAVENOUS

## 2014-05-26 NOTE — ED Notes (Signed)
MD at bedside. 

## 2014-05-26 NOTE — Progress Notes (Signed)
Received report from [place]

## 2014-05-26 NOTE — H&P (Addendum)
Cardiologist: Gwenlyn Found  HPI:  68 y/o woman with HTN, HL and CAD s/p CABG in 2010 with early graft failure presents to ER with CP.   Had CABG in 2010. Cath 12/10 showed early graft failure. Treated medically.  1. Left main; 60% ostial with damping using a 5-French catheter. 2. LAD; widely patent with faint competitive flow via a patent LIMA in     the midportion.  There are 2 patent diagonal branches. 3. Left circumflex; nondominant with an 80% stenosis in the midportion     of the OM-1, which arose in the mid third of the vessel. 4. Right coronary artery; dominant with a patent stent in the     midportion and 30% "in-stent" restenosis.  There was a 50% hazy     bifurcation lesion in the PDA and PLA branch. 5. Vein graft to the RCA occluded at aorta. 6. Vein graft to the circumflex occluded at the aorta. 7. LIMA to the LAD; this was patent though was mildly atretic with     limited flow. 8. Left ventriculography; RAO left ventriculogram performed using 25     mL of Visipaque dye at 12 mL per second.  The overall LVEF was     estimated greater than 60% without focal wall motion abnormalities.  Did well until March of this year when had some CP when she was worried about breast lumps. Myoview performed and was normal.   Today was on way to church when she has CP radiating to jaw which was similar to prior angina though not as severe. She gave herself 5 sprays of nitroglycerin which resolved the pain. When she was on her way to church the pain started again. She gave herself another 2 sprays of nitroglycerin and called EMS. She describes shortness of breath, lightheadedness and clammy feeling with this episode. She is now pain free. ECG and trop x 1 negative.    Review of Systems:     Cardiac Review of Systems: {Y] = yes [ ]  = no  Chest Pain [ y   ]  Resting SOB [   ] Exertional SOB  [  ]  Orthopnea [  ]   Pedal Edema [   ]    Palpitations [  ] Syncope  [  ]   Presyncope [    ]  General Review of Systems: [Y] = yes [  ]=no Constitional: recent weight change [  ]; anorexia [  ]; fatigue Blue.Reese  ]; nausea [  ]; night sweats [  ]; fever [  ]; or chills [  ];                                                                                                                                           Eye : blurred vision [  ]; diplopia [   ]; vision changes [  ];  Amaurosis fugax[  ]; Resp: cough [  ];  wheezing[  ];  hemoptysis[  ]; shortness of breath[  ]; paroxysmal nocturnal dyspnea[  ]; dyspnea on exertion[  ]; or orthopnea[  ];  GI:  gallstones[  ], vomiting[  ];  dysphagia[  ]; melena[  ];  hematochezia [  ]; heartburn[  ];   GU: kidney stones [  ]; hematuria[  ];   dysuria [  ];  nocturia[  ];  history of     obstruction [  ];                 Skin: rash, swelling[  ];, hair loss[  ];  peripheral edema[  ];  or itching[  ]; Musculosketetal: myalgias[  ];  joint swelling[  ];  joint erythema[  ];  joint pain[  y];  back pain[  ];  Heme/Lymph: bruising[  ];  bleeding[  ];  anemia[  ];  Neuro: TIA[  ];  headaches[  ];  stroke[  ];  vertigo[  ];  seizures[  ];   paresthesias[  ];  difficulty walking[  ];  Psych:depression[  ]; anxiety[  ];  Endocrine: diabetes[  ];  thyroid dysfunction[  ];  Other:  Past Medical History  Diagnosis Date  . Hypercholesteremia   . Memory impairment      MMSE 27/30  . Cancer     ovarian  . Arthritis   . Allergy   . Osteopenia   . Coronary artery disease   . Hypertension      (Not in a hospital admission)   Allergies  Allergen Reactions  . Codeine Nausea And Vomiting  . Hydrocortisone Itching    History   Social History  . Marital Status: Married    Spouse Name: N/A    Number of Children: N/A  . Years of Education: N/A   Occupational History  . Not on file.   Social History Main Topics  . Smoking status: Former Research scientist (life sciences)  . Smokeless tobacco: Not on file  . Alcohol Use: 0.6 oz/week    1 Glasses of wine per week  .  Drug Use: No  . Sexual Activity: Not on file   Other Topics Concern  . Not on file   Social History Narrative  . No narrative on file    Family History  Problem Relation Age of Onset  . Stroke Mother   . Diabetes Mother     PHYSICAL EXAM: Filed Vitals:   05/26/14 1415  BP: 131/43  Pulse: 53  Temp:   Resp: 14   General:  Well appearing. No respiratory difficulty HEENT: normal Neck: supple. no JVD. Carotids 2+ bilat; no bruits. No lymphadenopathy or thryomegaly appreciated. Cor: PMI nondisplaced. Regular rate & rhythm. No rubs, gallops or murmurs. Lungs: clear Abdomen: soft, nontender, nondistended. No hepatosplenomegaly. No bruits or masses. Good bowel sounds. Extremities: no cyanosis, clubbing, rash, edema Neuro: alert & oriented x 3, cranial nerves grossly intact. moves all 4 extremities w/o difficulty. Affect pleasant.  ECG: NSR No ST-T wave abnormalities.    Results for orders placed during the hospital encounter of 05/26/14 (from the past 24 hour(s))  CBC     Status: None   Collection Time    05/26/14 11:42 AM      Result Value Ref Range   WBC 4.1  4.0 - 10.5 K/uL   RBC 4.36  3.87 - 5.11 MIL/uL   Hemoglobin 12.6  12.0 - 15.0 g/dL  HCT 37.9  36.0 - 46.0 %   MCV 86.9  78.0 - 100.0 fL   MCH 28.9  26.0 - 34.0 pg   MCHC 33.2  30.0 - 36.0 g/dL   RDW 13.2  11.5 - 15.5 %   Platelets 193  150 - 400 K/uL  BASIC METABOLIC PANEL     Status: Abnormal   Collection Time    05/26/14 11:42 AM      Result Value Ref Range   Sodium 133 (*) 137 - 147 mEq/L   Potassium 5.7 (*) 3.7 - 5.3 mEq/L   Chloride 98  96 - 112 mEq/L   CO2 24  19 - 32 mEq/L   Glucose, Bld 89  70 - 99 mg/dL   BUN 19  6 - 23 mg/dL   Creatinine, Ser 1.01  0.50 - 1.10 mg/dL   Calcium 9.0  8.4 - 10.5 mg/dL   GFR calc non Af Amer 55 (*) >90 mL/min   GFR calc Af Amer 64 (*) >90 mL/min   Anion gap 11  5 - 15  PRO B NATRIURETIC PEPTIDE     Status: Abnormal   Collection Time    05/26/14 11:42 AM       Result Value Ref Range   Pro B Natriuretic peptide (BNP) 185.6 (*) 0 - 125 pg/mL  PROTIME-INR     Status: None   Collection Time    05/26/14 11:42 AM      Result Value Ref Range   Prothrombin Time 13.0  11.6 - 15.2 seconds   INR 0.98  0.00 - 1.49  I-STAT TROPOININ, ED     Status: None   Collection Time    05/26/14 12:08 PM      Result Value Ref Range   Troponin i, poc 0.00  0.00 - 0.08 ng/mL   Comment 3            Dg Chest 2 View  05/26/2014   CLINICAL DATA:  Chest pain  EXAM: CHEST  2 VIEW  COMPARISON:  01/21/2014  FINDINGS: Cardiomediastinal silhouette is stable. Status post CABG. No acute infiltrate or pleural effusion. Mild elevation of the right hemidiaphragm again noted. No pulmonary edema. Bony thorax is stable.  IMPRESSION: No active cardiopulmonary disease.  Status post CABG.   Electronically Signed   By: Lahoma Crocker M.D.   On: 05/26/2014 13:11     ASSESSMENT: 1. [place] 2. CAD s/p CABG 2010 with early graft failure    --Myoview 3/15. Normal 3. HL - on statin 4. HTN  - well controlled   PLAN/DISCUSSION:  CP concerning for [place]. Now pain free. ECG and troponin negative. Will admit. Start heparin. Continue asa, plavix, b-blocker and statin. Cath tomorrow.   Benay Spice 2:32 PM

## 2014-05-26 NOTE — ED Notes (Signed)
Pt arrived by [facility] with c/o CP. Pt has hx of stents in past and CABG. While on the way to church with husband driving this morning pt started to have CP that is central and radiated to jaws. Pt stated that she took her Nitro spray x 5 total and while in parking lot at church decided to call EMS. Pt has been pain free since EMS arrived. EMS administered ASA 324mg . Denies any pain at this time and denies and SOB, lightheadedness or n/v.

## 2014-05-26 NOTE — Progress Notes (Signed)
ANTICOAGULATION CONSULT NOTE - Follow Up Consult  Pharmacy Consult for heparin Indication: USAP  Labs:  Recent Labs  05/26/14 1142 05/26/14 1630 05/26/14 2113 05/26/14 2253  HGB 12.6  --   --   --   HCT 37.9  --   --   --   PLT 193  --   --   --   LABPROT 13.0 14.9  --   --   INR 0.98 1.17  --   --   HEPARINUNFRC  --   --   --  0.45  CREATININE 1.01  --   --   --   TROPONINI  --  <0.30 <0.30  --     Assessment/Plan:  68yo female therapeutic on heparin with initial dosing for USAP. Will continue gtt at current rate and confirm stable with am labs.   Wynona Neat, PharmD, BCPS  05/26/2014,11:32 PM

## 2014-05-26 NOTE — Progress Notes (Signed)
ANTICOAGULATION CONSULT NOTE - Initial Consult  Pharmacy Consult for Heparin Indication: chest pain/ACS  Allergies  Allergen Reactions  . Codeine Nausea And Vomiting  . Hydrocortisone Itching    Patient Measurements: Height: 5\' 3"  (160 cm) Weight: 170 lb (77.111 kg) IBW/kg (Calculated) : 52.4 Heparin Dosing Weight: 69 kg  Vital Signs: Temp: 97.9 F (36.6 C) (08/23 1104) Temp src: Oral (08/23 1104) BP: 131/43 mmHg (08/23 1415) Pulse Rate: 53 (08/23 1415)  Labs:  Recent Labs  05/26/14 1142  HGB 12.6  HCT 37.9  PLT 193  LABPROT 13.0  INR 0.98  CREATININE 1.01    Estimated Creatinine Clearance: 51.7 ml/min (by C-G formula based on Cr of 1.01).   Medical History: Past Medical History  Diagnosis Date  . Hypercholesteremia   . Memory impairment      MMSE 27/30  . Cancer     ovarian  . Arthritis   . Allergy   . Osteopenia   . Coronary artery disease   . Hypertension     Medications:  Anti-infectives   None      Assessment: 68 year old female with history of CAD presenting with chest pain.  To begin anticoagulation with Heparin.  Baseline protime WNL.  Goal of Therapy:  Heparin level 0.3-0.7 units/ml Monitor platelets by anticoagulation protocol: Yes   Plan:  Heparin 4000 units IV bolus x 1 Start Heparin infusion at 950 units/hr Check heparin level in 8 hours Daily Heparin level and CBC  Legrand Como, Pharm.D., BCPS, AAHIVP Clinical Pharmacist Phone: [phone] or [phone] 05/26/2014, 2:23 PM

## 2014-05-26 NOTE — ED Notes (Addendum)
MD at bedside DR Tempie Hoist

## 2014-05-26 NOTE — ED Notes (Signed)
Attempted report 

## 2014-05-26 NOTE — Progress Notes (Signed)
Kristina Gibbs [id number] Admission Data: 05/26/2014 5:30 PM Attending Provider: Jolaine Artist, MD  [id number], Elenore Rota, MD Consults/ Treatment Team:    Kristina Gibbs is a 68 y.o. female patient admitted from ED awake, alert  & orientated  X 3,  Full Code, VSS - Blood pressure 161/42, pulse 52, temperature 97.9 F (36.6 C), temperature source Oral, resp. rate 15, height 5\' 3"  (1.6 m), weight 77.111 kg (170 lb), SpO2 100.00%., no c/o shortness of breath, no c/o chest pain, no distress noted. Tele # [id number] placed and pt is currently running:normal sinus rhythm.   IV site WDL:  forearm right, condition patent and no redness with a transparent dsg that's clean dry and intact.  Allergies:   Allergies  Allergen Reactions  . Codeine Nausea And Vomiting  . Hydrocortisone Itching     Past Medical History  Diagnosis Date  . Hypercholesteremia   . Memory impairment      MMSE 27/30  . Cancer     ovarian  . Arthritis   . Allergy   . Osteopenia   . Coronary artery disease   . Hypertension     History:  obtained from the patient. Tobacco/alcohol: denied glass of wine with dinner  Pt orientation to unit, room and routine. Information packet given to patient/family and safety video watched.  Admission INP armband ID verified with patient/family, and in place. SR up x 2, fall risk assessment complete with Patient and family verbalizing understanding of risks associated with falls. Pt verbalizes an understanding of how to use the call bell and to call for help before getting out of bed.  Skin, clean-dry- intact without evidence of bruising, or skin tears.   No evidence of skin break down noted on exam. ecchymoses - arm(s) left and scabs on left arm.     Will cont to monitor and assist as needed.  Hildegarde Dunaway Margaretha Sheffield, RN 05/26/2014 5:30 PM

## 2014-05-26 NOTE — ED Provider Notes (Signed)
TIME SEEN: 11:28 AM  CHIEF COMPLAINT: Chest pain  HPI: Patient is a 68 y.o. 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 She states she was at rest when this started. She herself 5 sprays of nitroglycerin which resolved the pain. When she was on her way to church the pain started again. She gave herself another 2 sprays of nitroglycerin and called EMS. She describes shortness of breath, lightheadedness and clammy feeling with this episode. No nausea or vomiting. She states it feels similar to when she had her prior MI but more intense. She denies a history of PE or DVT. No recent lower extremity swelling or pain. No fevers or cough. She is now pain free.  ROS: See HPI Constitutional: no fever  Eyes: no drainage  ENT: no runny nose   Cardiovascular:   chest pain  Resp:  SOB  GI: no vomiting GU: no dysuria Integumentary: no rash  Allergy: no hives  Musculoskeletal: no leg swelling  Neurological: no slurred speech ROS otherwise negative  PAST MEDICAL HISTORY/PAST SURGICAL HISTORY:  Past Medical History  Diagnosis Date  . Hypercholesteremia   . Memory impairment      MMSE 27/30  . Cancer     ovarian  . Arthritis   . Allergy   . Osteopenia   . Coronary artery disease   . Hypertension     MEDICATIONS:  Prior to Admission medications   Medication Sig Start Date End Date Taking? Authorizing Provider  alendronate (FOSAMAX) 70 MG tablet TAKE ONE TABLET BY MOUTH EVERY WEEK 11/10/13   Vernie Shanks, MD  amoxicillin-clavulanate (AUGMENTIN) 875-125 MG per tablet Take 1 tablet by mouth 2 (two) times daily. 02/18/14   Sharion Balloon, FNP  aspirin EC 81 MG tablet Take 81 mg by mouth daily.    Historical Provider, MD  b complex vitamins tablet Take 2 tablets by mouth daily.    Historical Provider, MD  calcium  carbonate (OS-CAL) 600 MG TABS Take 600 mg by mouth daily.    Historical Provider, MD  cholecalciferol (VITAMIN D) 1000 UNITS tablet Take 1,000 Units by mouth daily.    Historical Provider, MD  clopidogrel (PLAVIX) 75 MG tablet Take 1 tablet (75 mg total) by mouth daily. 02/11/14   [name], MD  CO ENZYME Q-10 PO Take 1 tablet by mouth daily.    Historical Provider, MD  CRESTOR 20 MG tablet TAKE 1 TABLET BY MOUTH DAILY 10/01/13   [name], MD  Docusate Calcium (SURFAK PO) Take 1 tablet by mouth daily.    Historical Provider, MD  doxycycline (VIBRA-TABS) 100 MG tablet Take 2 tablets (200 mg total) by mouth daily. 03/07/14   Sharion Balloon, FNP  fluticasone (FLONASE) 50 MCG/ACT nasal spray Place 2 sprays into both nostrils daily. 01/12/14   Mary-Margaret Hassell Done, FNP  irbesartan (AVAPRO) 75 MG tablet TAKE 1 TABLET (75 MG TOTAL) BY MOUTH AT BEDTIME. 12/07/13   [name], MD  isosorbide mononitrate (IMDUR) 60 MG 24 hr tablet TAKE 1 TABLE DAILY 10/02/13   [name], MD  lubiprostone (AMITIZA) 24 MCG capsule Take 1 capsule (24 mcg total) by mouth 2 (two) times daily with a meal. Prn 01/14/14   Vernie Shanks, MD  metoprolol tartrate (LOPRESSOR) 25 MG tablet Take 12.5 mg by mouth daily.  Historical Provider, MD  nitroGLYCERIN (NITROLINGUAL) 0.4 MG/SPRAY spray Place 1 spray under the tongue every 5 (five) minutes as needed for chest pain. 10/25/13   [name], MD  omega-3 acid ethyl esters (LOVAZA) 1 G capsule TAKE 2 CAPSULES BY MOUTH 2 (TWO) TIMES DAILY. 01/11/14   Vernie Shanks, MD  pantoprazole (PROTONIX) 40 MG tablet Take 1 tablet (40 mg total) by mouth daily. 01/04/14   [name], MD  ranolazine (RANEXA) 500 MG 12 hr tablet Take 1 tablet (500 mg total) by mouth 2 (two) times daily. 11/13/13   [name], MD  triamterene-hydrochlorothiazide (DYAZIDE) 37.5-25 MG per capsule Take 1 capsule by mouth daily as needed. For fluid retention.    Historical Provider, MD   vitamin C (ASCORBIC ACID) 500 MG tablet Take 1,500 mg by mouth daily.    Historical Provider, MD    ALLERGIES:  Allergies  Allergen Reactions  . Codeine   . Hydrocortisone     Hydrocortisone cream makes her itch    SOCIAL HISTORY:  History  Substance Use Topics  . Smoking status: Former Research scientist (life sciences)  . Smokeless tobacco: Not on file  . Alcohol Use: 0.6 oz/week    1 Glasses of wine per week    FAMILY HISTORY: Family History  Problem Relation Age of Onset  . Stroke Mother   . Diabetes Mother     EXAM: BP 145/50  Pulse 57  Temp(Src) 97.9 F (36.6 C) (Oral)  Resp 16  Ht 5\' 3"  (1.6 m)  Wt 170 lb (77.111 kg)  BMI 30.12 kg/m2  SpO2 97% CONSTITUTIONAL: Alert and oriented and responds appropriately to questions. Well-appearing; well-nourished HEAD: Normocephalic EYES: Conjunctivae clear, PERRL ENT: normal nose; no rhinorrhea; moist mucous membranes; pharynx without lesions noted NECK: Supple, no meningismus, no LAD  CARD: RRR; S1 and S2 appreciated; no murmurs, no clicks, no rubs, no gallops RESP: Normal chest excursion without splinting or tachypnea; breath sounds clear and equal bilaterally; no wheezes, no rhonchi, no rales, no hypoxia or respiratory distress ABD/GI: Normal bowel sounds; non-distended; soft, non-tender, no rebound, no guarding BACK:  The back appears normal and is non-tender to palpation, there is no CVA tenderness EXT: Normal ROM in all joints; non-tender to palpation; no edema; normal capillary refill; no cyanosis    SKIN: Normal color for age and race; warm NEURO: Moves all extremities equally PSYCH: The patient's mood and manner are appropriate. Grooming and personal hygiene are appropriate.  MEDICAL DECISION MAKING: Patient here with concerning story for ACS. She had a normal stress test March 2015. We'll obtain cardiac labs, chest x-ray. [facility] consult cardiology when results back.  ED PROGRESS: 2:18 PM  Pt's troponin is negative. Chest x-ray clear. She  does have mild elevation of her potassium but no EKG changes. We'll hydrate. Discussed with Dr. Blanchard Mane with cardiology who will see the patient for admission. He recommends starting heparin and states that patient will likely be catheterized in the morning. Discussed with patient and family are comfortable with this plan.     EKG Interpretation  Date/Time:  Sunday May 26 2014 10:59:10 EDT Ventricular Rate:  55 PR Interval:  201 QRS Duration: 90 QT Interval:  423 QTC Calculation: 404 R Axis:   79 Text Interpretation:  Sinus rhythm Atrial premature complex Confirmed by Randall Rampersad,  DO, Shizuo Biskup ([id number]) on 05/26/2014 11:28:31 AM         [place], DO 05/26/14 1420

## 2014-05-27 ENCOUNTER — Encounter (HOSPITAL_COMMUNITY)
Admission: EM | Disposition: A | Discharge status: Home / Self Care | Payer: Self-pay | Source: Home / Self Care | Attending: Emergency Medicine

## 2014-05-27 ENCOUNTER — Encounter (HOSPITAL_COMMUNITY): Payer: Self-pay | Admitting: Cardiology

## 2014-05-27 DIAGNOSIS — I2510 Atherosclerotic heart disease of native coronary artery without angina pectoris: Secondary | ICD-10-CM | POA: Diagnosis not present

## 2014-05-27 DIAGNOSIS — I25810 Atherosclerosis of coronary artery bypass graft(s) without angina pectoris: Secondary | ICD-10-CM | POA: Diagnosis not present

## 2014-05-27 DIAGNOSIS — E785 Hyperlipidemia, unspecified: Secondary | ICD-10-CM

## 2014-05-27 DIAGNOSIS — I200 Unstable angina: Secondary | ICD-10-CM | POA: Diagnosis not present

## 2014-05-27 DIAGNOSIS — I10 Essential (primary) hypertension: Secondary | ICD-10-CM

## 2014-05-27 DIAGNOSIS — E7800 Pure hypercholesterolemia, unspecified: Secondary | ICD-10-CM | POA: Diagnosis not present

## 2014-05-27 HISTORY — PX: LEFT HEART CATHETERIZATION WITH CORONARY ANGIOGRAM: SHX5451

## 2014-05-27 HISTORY — DX: Unstable angina: I20.0

## 2014-05-27 LAB — COMPREHENSIVE METABOLIC PANEL
ALK PHOS: 53 U/L (ref 39–117)
ALT: 21 U/L (ref 0–35)
ANION GAP: 11 (ref 5–15)
AST: 22 U/L (ref 0–37)
Albumin: 3.5 g/dL (ref 3.5–5.2)
BILIRUBIN TOTAL: 0.6 mg/dL (ref 0.3–1.2)
BUN: 17 mg/dL (ref 6–23)
CO2: 23 meq/L (ref 19–32)
Calcium: 8.6 mg/dL (ref 8.4–10.5)
Chloride: 101 mEq/L (ref 96–112)
Creatinine, Ser: 1 mg/dL (ref 0.50–1.10)
GFR calc Af Amer: 65 mL/min — ABNORMAL LOW (ref >90)
GFR, EST NON AFRICAN AMERICAN: 56 mL/min — AB (ref >90)
Glucose, Bld: 103 mg/dL — ABNORMAL HIGH (ref 70–99)
POTASSIUM: 4.3 meq/L (ref 3.7–5.3)
Sodium: 135 mEq/L — ABNORMAL LOW (ref 137–147)
Total Protein: 6.5 g/dL (ref 6.0–8.3)

## 2014-05-27 LAB — LIPID PANEL
CHOL/HDL RATIO: 1.9 ratio
Cholesterol: 142 mg/dL (ref 0–200)
HDL: 73 mg/dL (ref >39)
LDL Cholesterol: 56 mg/dL (ref 0–99)
Triglycerides: 65 mg/dL (ref <150)
VLDL: 13 mg/dL (ref 0–40)

## 2014-05-27 LAB — HEMOGLOBIN A1C
Hgb A1c MFr Bld: 5.5 % (ref <5.7)
Mean Plasma Glucose: 111 mg/dL (ref <117)

## 2014-05-27 LAB — CBC
HEMATOCRIT: 35.7 % — AB (ref 36.0–46.0)
HEMOGLOBIN: 11.9 g/dL — AB (ref 12.0–15.0)
MCH: 28.8 pg (ref 26.0–34.0)
MCHC: 33.3 g/dL (ref 30.0–36.0)
MCV: 86.4 fL (ref 78.0–100.0)
Platelets: 191 10*3/uL (ref 150–400)
RBC: 4.13 MIL/uL (ref 3.87–5.11)
RDW: 13.4 % (ref 11.5–15.5)
WBC: 4.4 10*3/uL (ref 4.0–10.5)

## 2014-05-27 LAB — PROTIME-INR
INR: 1.08 (ref 0.00–1.49)
Prothrombin Time: 14 seconds (ref 11.6–15.2)

## 2014-05-27 LAB — POCT ACTIVATED CLOTTING TIME: ACTIVATED CLOTTING TIME: 141 s

## 2014-05-27 LAB — HEPARIN LEVEL (UNFRACTIONATED): HEPARIN UNFRACTIONATED: 0.42 [IU]/mL (ref 0.30–0.70)

## 2014-05-27 LAB — TROPONIN I

## 2014-05-27 SURGERY — LEFT HEART CATHETERIZATION WITH CORONARY ANGIOGRAM
Anesthesia: LOCAL

## 2014-05-27 MED ORDER — NITROGLYCERIN 1 MG/10 ML FOR IR/CATH LAB
INTRA_ARTERIAL | Status: AC
  Filled 2014-05-27: qty 10

## 2014-05-27 MED ORDER — ACETAMINOPHEN 325 MG PO TABS: 650.0000 mg | ORAL_TABLET | ORAL | Status: DC | PRN

## 2014-05-27 MED ORDER — ASPIRIN 81 MG PO CHEW: 81.0000 mg | CHEWABLE_TABLET | Freq: Every day | ORAL | Status: DC

## 2014-05-27 MED ORDER — PANTOPRAZOLE SODIUM 40 MG PO TBEC
40.0000 mg | DELAYED_RELEASE_TABLET | Freq: Two times a day (BID) | ORAL | Status: DC
Start: 2014-05-27 — End: 2014-11-05

## 2014-05-27 MED ORDER — SODIUM CHLORIDE 0.9 % IV SOLN: INTRAVENOUS | Status: AC

## 2014-05-27 MED ORDER — ONDANSETRON HCL 4 MG/2ML IJ SOLN: 4.0000 mg | Freq: Four times a day (QID) | INTRAMUSCULAR | Status: DC | PRN

## 2014-05-27 NOTE — CV Procedure (Signed)
Kristina Gibbs is a 68 y.o. female    [id number] LOCATION:  FACILITY: [facility]  PHYSICIAN: Quay Burow, M.D. 1945/10/18   DATE OF PROCEDURE:  05/27/2014  DATE OF DISCHARGE:     CARDIAC CATHETERIZATION     History obtained from chart review.[name] is a 68 year old female with history of CAD status post coronary artery bypass grafting in 2010. She had cardiac catheterization soon after her bypass laughing in December 2010 revealing occluded vein graft at the aorta with a atretic LIMA to the LAD. Her other anatomy was remarkable for a 60% ostial left main, 80% mid OM 3 stenosis and a patent mid RCA stent with normal LV function. She was admitted to the hospital for chest pain rule out myocardial infarction yesterday after developing chest pain in church which resolved spontaneously. She ruled out for myocardial infarction by isoenzymes. She presents now for elective diagnostic coronary arteriography to rule out progression of disease.   PROCEDURE DESCRIPTION:   The patient was brought to the second floor [facility] cath lab in the postabsorptive state. She was not premedicated. Her right groinwas prepped and shaved in usual sterile fashion. Xylocaine 1% was used for local anesthesia. A 5 French sheath was inserted into the right common femoral artery using standard Seldinger technique. 5 French right and left Judkins diagnostic catheters along with a 5 French pigtail catheter were used for selective coronary angiography, selective internal mammary artery graft angiography and left ventriculography. On the pigtail was used for the entirety of the case. Retrograde aortic, left ventricular and pullback pressures were recorded.   HEMODYNAMICS:    AO SYSTOLIC/AO DIASTOLIC: 425/95   LV SYSTOLIC/LV DIASTOLIC: 638/75, there was no obvious pullback gradient noted  ANGIOGRAPHIC RESULTS:   1. Left main; 50-60% ostial  2. LAD; minor irregularities with competitive flow distally  secondary to internal mammary artery graft flow 3. Left circumflex; 30% segmental proximal, nondominant vessel. There was a 75% focal lesion in the mid OM 1 unchanged from prior cath.  4. Right coronary artery; dominant with a widely patent mid RCA stent. 5.LIMA TO LAD; widely patent 6. SVG TO distal RCA was occluded at the aorta demonstrated from prior cath     SVG TO circumflex obtuse marginal branch was occluded at the aorta demonstrated a prior cath 7. Left ventriculography; RAO left ventriculogram was performed using  25 mL of Visipaque dye at 12 mL/second. The overall LVEF estimated  60 %  Without wall motion abnormalities  IMPRESSION:[name] has a patent LIMA to the LAD and otherwise anatomy unchanged from her prior cath December 2010. There are no "culprit lesions". She has normal left ventricular function. Continue medical therapy will be recommended. The sheath was removed and pressure was held on the groin to achieve hemostasis. The patient left the lab in stable condition. She will remain recumbent for 4 hours and gently hydrated after which she'll be discharged home and seen back in the office in several weeks for followup.  [name] MD, [facility] 05/27/2014 11:50 AM

## 2014-05-27 NOTE — H&P (View-Only) (Signed)
Subjective: No further complaints, NPO for cath  Objective: Vital signs in last 24 hours: Temp:  [97.9 F (36.6 C)-98.2 F (36.8 C)] 98.2 F (36.8 C) (08/24 0500) Pulse Rate:  [52-57] 56 (08/24 0500) Resp:  [13-25] 18 (08/24 0500) BP: (100-161)/(40-53) 100/40 mmHg (08/24 0500) SpO2:  [96 %-100 %] 98 % (08/24 0500) Weight:  [170 lb (77.111 kg)-170 lb 3.1 oz (77.2 kg)] 170 lb 3.1 oz (77.2 kg) (08/24 0500) Weight change:  Last BM Date: 05/25/14 Intake/Output from previous day: -1767 08/23 0701 - 08/24 0700 In: 33 [I.V.:33] Out: 1800 [Urine:1800] Intake/Output this shift:    PE: General:Pleasant affect, NAD Skin:Warm and dry, brisk capillary refill HEENT:normocephalic, sclera clear, mucus membranes moist Neck:supple, no JVD, no bruits  Heart:S1S2 RRR without murmur, gallup, rub or click Lungs:clear without rales, rhonchi, or wheezes DVV:OHYW, non tender, + BS, do not palpate liver spleen or masses Ext:no lower ext edema, 2+ pedal pulses, 2+ radial pulses Neuro:alert and oriented, MAE, follows commands, + facial symmetry TELE:  SB HR 52 down to 48 during sleep time  EKG SBrady without acute changes   Lab Results:  Recent Labs  05/26/14 1142 05/27/14 0315  WBC 4.1 4.4  HGB 12.6 11.9*  HCT 37.9 35.7*  PLT 193 191   BMET  Recent Labs  05/26/14 1142 05/27/14 0315  NA 133* 135*  K 5.7* 4.3  CL 98 101  CO2 24 23  GLUCOSE 89 103*  BUN 19 17  CREATININE 1.01 1.00  CALCIUM 9.0 8.6    Recent Labs  05/26/14 2113 05/27/14 0315  TROPONINI <0.30 <0.30    Lab Results  Component Value Date   CHOL 142 05/27/2014   HDL 73 05/27/2014   LDLCALC 56 05/27/2014   TRIG 65 05/27/2014   CHOLHDL 1.9 05/27/2014   Lab Results  Component Value Date   HGBA1C 5.5 05/26/2014     Lab Results  Component Value Date   TSH 2.910 05/26/2014    Hepatic Function Panel  Recent Labs  05/27/14 0315  PROT 6.5  ALBUMIN 3.5  AST 22  ALT 21  ALKPHOS 53  BILITOT 0.6     Recent Labs  05/27/14 0315  CHOL 142   No results found for this basename: PROTIME,  in the last 72 hours     Studies/Results: Dg Chest 2 View  05/26/2014   CLINICAL DATA:  Chest pain  EXAM: CHEST  2 VIEW  COMPARISON:  01/21/2014  FINDINGS: Cardiomediastinal silhouette is stable. Status post CABG. No acute infiltrate or pleural effusion. Mild elevation of the right hemidiaphragm again noted. No pulmonary edema. Bony thorax is stable.  IMPRESSION: No active cardiopulmonary disease.  Status post CABG.   Electronically Signed   By: Lahoma Crocker M.D.   On: 05/26/2014 13:11    Medications: I have reviewed the patient's current medications. Scheduled Meds: . aspirin  324 mg Oral NOW   Or  . aspirin  300 mg Rectal NOW  . aspirin EC  81 mg Oral Daily  . atorvastatin  80 mg Oral q1800  . clopidogrel  75 mg Oral Daily  . heparin  4,000 Units Intravenous Once  . irbesartan  75 mg Oral Daily  . isosorbide mononitrate  60 mg Oral Daily  . lubiprostone  24 mcg Oral QHS  . metoprolol tartrate  12.5 mg Oral Daily  . omega-3 acid ethyl esters  1 g Oral QID  . pantoprazole  40 mg  Oral Daily  . ranolazine  500 mg Oral BID  . sodium chloride  1,000 mL Intravenous Once  . sodium chloride  3 mL Intravenous Q12H  . sodium chloride  3 mL Intravenous Q12H   Continuous Infusions: . sodium chloride 1 mL/kg/hr (05/27/14 0400)  . heparin     PRN Meds:.sodium chloride, sodium chloride, acetaminophen, nitroGLYCERIN, ondansetron (ZOFRAN) IV, sodium chloride, sodium chloride  Assessment/Plan: 68 y/o woman with HTN, HL and CAD s/p CABG in 2010 with early graft failure presents to ER with CP.  Cath 12/10 showed early graft failure. Treated medically. With pain on admit similar to previous angina 5 sprays of NTG with resolution of the pain.   Principal Problem:   Intermediate coronary syndrome, neg MI, for cardiac cath today. Active Problems:   CORONARY ARTERY DISEASE, with CABG 2010 and graft  occl.at aorta and atretic LIMA   HLD (hyperlipidemia)- normally on crestor 20 mg with well controlled LDL, now on lipitor but would resume home crestor at discharge.   Hypertension- controlled.   Unstable angina    LOS: 1 day   Time spent with pt. :15 minutes. [facility] R  Nurse Practitioner Certified Pager [phone] or after 5pm and on weekends call [phone] 05/27/2014, 9:19 AM   Patient seen, examined. Available data reviewed. Agree with findings, assessment, and plan as outlined by [name], NP. Pt has been CP-free since admission. Sx's yesterday fairly concerning with chest pain/burning radiating to jaw requiring multiple NTG sprays. Known CAD and prior CABG. Last cath 2010 by Dr Gwenlyn Found reviewed. She had ostial LM stenosis (60%) and atretic LIMA-LAD. Native RCA stents had mild ISR. Plans for cath and possible PCI noted this am. Reviewed procedural risks, indications, and alternatives. She understands and agrees to proceed.  Sherren Mocha, M.D. 05/27/2014 10:37 AM

## 2014-05-27 NOTE — Discharge Summary (Signed)
Physician Discharge Summary       Patient ID: Kristina Gibbs MRN: [id number] DOB/AGE: 02-18-1946 68 y.o.  Admit date: 05/26/2014 Discharge date: 05/27/2014  Discharge Diagnoses:  Principal Problem:   Intermediate coronary syndrome, stable LIMA-LAD at cath, no other changes continues with VG occlusion.  May be GI source Active Problems:   CORONARY ARTERY DISEASE, with CABG 2010 and graft occl.at aorta and atretic LIMA   GERD   HLD (hyperlipidemia)   Hypertension   Discharged Condition: good  Procedures: 05/27/14  Cardiac cath by Dr. Mike Craze Course: 68 year old mildly overweight Caucasian female with a history of CAD previously taken care of by Dr. Rollene Fare. She has a history of anterior wall myocardial infarction in 1995 with subsequent PCIs to her RCA and LAD. With progressive CAD she required multivessel bypass surgery by Dr. Gorden Harms in March of 2000 and LIMA to LAD, vein to a marginal branch of the circumflex and distal right coronary artery. Problems included hypertension and hyperlipidemia. Dr. Adora Fridge  performed cardiac catheterization on her/2/10 revealing occluded vein grafts, patent LIMA and normal LV function. In April she had chest pain and a Myoview stress test was normal. Her pain suddenly resolved and she thought due to anxiety.  She was admitted 05/26/14 secondary to CP radiating to jaw which was similar to prior angina though not as severe. She gave herself 5 sprays of nitroglycerin which resolved the pain. When she was on her way to church the pain started again. She gave herself another 2 sprays of nitroglycerin and called EMS.  She was admitted placed on IV heparin and plans for cardiac cath.  She did well overnight.  Cardiac cath revealed no changes in anatomy.  For now will increase her protonix to BID for 2 weeks.  If pain continues will increase her Imdur.  While VG-RCA occluded the native RCA with mid stent was patent.  And VG-LCX occluded but  native LCX with 30% stenosis and unchanged 75% focal lesion in mOM1.  EF 60%.     Consults: cardiology  Significant Diagnostic Studies:  BMET    Component Value Date/Time   NA 135* 05/27/2014 0315   NA 137 02/20/2014 0812   K 4.3 05/27/2014 0315   CL 101 05/27/2014 0315   CO2 23 05/27/2014 0315   GLUCOSE 103* 05/27/2014 0315   GLUCOSE 95 02/20/2014 0812   BUN 17 05/27/2014 0315   BUN 15 02/20/2014 0812   CREATININE 1.00 05/27/2014 0315   CREATININE 1.13* 04/17/2013 0903   CALCIUM 8.6 05/27/2014 0315   GFRNONAA 56* 05/27/2014 0315   GFRNONAA 50* 04/17/2013 0903   GFRAA 65* 05/27/2014 0315   GFRAA 58* 04/17/2013 0903    CBC    Component Value Date/Time   WBC 4.4 05/27/2014 0315   WBC 4.1* 02/20/2014 0924   RBC 4.13 05/27/2014 0315   RBC 4.3 02/20/2014 0924   HGB 11.9* 05/27/2014 0315   HGB 12.6 02/20/2014 0924   HCT 35.7* 05/27/2014 0315   HCT 38.3 02/20/2014 0924   PLT 191 05/27/2014 0315   MCV 86.4 05/27/2014 0315   MCV 89.2 02/20/2014 0924   MCH 28.8 05/27/2014 0315   MCH 29.3 02/20/2014 0924   MCHC 33.3 05/27/2014 0315   MCHC 32.8 02/20/2014 0924   RDW 13.4 05/27/2014 0315   LYMPHSABS 1.6 05/09/2010 1605   MONOABS 0.4 05/09/2010 1605   EOSABS 0.2 05/09/2010 1605   BASOSABS 0.1 05/09/2010 1605     Troponin < 0.30  X 3,  Lipid Panel     Component Value Date/Time   CHOL 142 05/27/2014 0315   TRIG 65 05/27/2014 0315   TRIG 102 12/15/2012 1125   HDL 73 05/27/2014 0315   HDL 59 02/20/2014 0812   CHOLHDL 1.9 05/27/2014 0315   CHOLHDL 2.4 02/20/2014 0812   VLDL 13 05/27/2014 0315   LDLCALC 56 05/27/2014 0315   LDLCALC 63 02/20/2014 0812   LDLCALC 78 12/15/2012 1125   HgB A1C 5.5, TSH 2.9   EKG SR no acute changes   CHEST 2 VIEW  COMPARISON: 01/21/2014  FINDINGS:  Cardiomediastinal silhouette is stable. Status post CABG. No acute  infiltrate or pleural effusion. Mild elevation of the right  hemidiaphragm again noted. No pulmonary edema. Bony thorax is  stable.  IMPRESSION:  No active  cardiopulmonary disease. Status post CABG.    Discharge Exam: Blood pressure 117/43, pulse 55, temperature 98.2 F (36.8 C), temperature source Oral, resp. rate 18, height 5\' 3"  (1.6 m), weight 170 lb 3.1 oz (77.2 kg), SpO2 99.00%.   Disposition: 01-Home or Self Care     Medication List         alendronate 70 MG tablet  Commonly known as:  FOSAMAX  Take 70 mg by mouth once a week. Take with a full glass of water on an empty stomach.     aspirin EC 81 MG tablet  Take 81 mg by mouth daily.     b complex vitamins tablet  Take 2 tablets by mouth daily.     calcium carbonate 600 MG Tabs tablet  Commonly known as:  OS-CAL  Take 600 mg by mouth daily.     cholecalciferol 1000 UNITS tablet  Commonly known as:  VITAMIN D  Take 1,000 Units by mouth daily.     clopidogrel 75 MG tablet  Commonly known as:  PLAVIX  Take 1 tablet (75 mg total) by mouth daily.     CO ENZYME Q-10 PO  Take 1 tablet by mouth daily.     irbesartan 75 MG tablet  Commonly known as:  AVAPRO  Take 75 mg by mouth daily.     isosorbide mononitrate 60 MG 24 hr tablet  Commonly known as:  IMDUR  Take 60 mg by mouth daily.     LOVAZA 1 G capsule  Generic drug:  omega-3 acid ethyl esters  Take 1 g by mouth 4 (four) times daily.     lubiprostone 24 MCG capsule  Commonly known as:  AMITIZA  Take 24 mcg by mouth at bedtime.     metoprolol tartrate 25 MG tablet  Commonly known as:  LOPRESSOR  Take 12.5 mg by mouth daily.     nitroGLYCERIN 0.4 MG/SPRAY spray  Commonly known as:  NITROLINGUAL  Place 1 spray under the tongue every 5 (five) minutes as needed for chest pain.     pantoprazole 40 MG tablet  Commonly known as:  PROTONIX  Take 1 tablet (40 mg total) by mouth 2 (two) times daily. Take twice a day for 2 weeks then back to once a day     ranolazine 500 MG 12 hr tablet  Commonly known as:  RANEXA  Take 1 tablet (500 mg total) by mouth 2 (two) times daily.     rosuvastatin 20 MG tablet    Commonly known as:  CRESTOR  Take 20 mg by mouth daily.     sodium chloride 0.65 % Soln nasal spray  Commonly known as:  OCEAN  Place 1 spray into both nostrils as needed for congestion.     SURFAK PO  Take 1 tablet by mouth daily as needed (for constipation).     triamterene-hydrochlorothiazide 37.5-25 MG per capsule  Commonly known as:  DYAZIDE  Take 1 capsule by mouth daily as needed. For fluid retention.     vitamin C 500 MG tablet  Commonly known as:  ASCORBIC ACID  Take 1,000 mg by mouth daily.       Follow-up Information   Follow up with [facility], NP On 06/21/2014. (at 11:30 AM )    Specialty:  Cardiology   Contact information:   [address] [place] [address] [phone]        Discharge Instructions:  Call [facility] at [phone] if any bleeding, swelling or drainage at cath site.  May shower, no tub baths for 48 hours for groin sticks.   No lifting over 5 pounds for 3 days, no driving for 3 days.  We increased your protonix to twice a day for 2 weeks then back to once daily.    When you use your NTG spray only use 1-2 sprays then wait 5 min before using 1-2 sprays again.  Heart Healthy diet, call the office if any more pain and we would increase your imdur.    Signed: Isaiah Serge Nurse Practitioner-Certified [facility]: [facility] 05/27/2014, 1:15 PM  Time spent on discharge : >30 minutes.

## 2014-05-27 NOTE — Progress Notes (Signed)
UR completed 

## 2014-05-27 NOTE — Progress Notes (Signed)
Subjective: No further complaints, NPO for cath  Objective: Vital signs in last 24 hours: Temp:  [97.9 F (36.6 C)-98.2 F (36.8 C)] 98.2 F (36.8 C) (08/24 0500) Pulse Rate:  [52-57] 56 (08/24 0500) Resp:  [13-25] 18 (08/24 0500) BP: (100-161)/(40-53) 100/40 mmHg (08/24 0500) SpO2:  [96 %-100 %] 98 % (08/24 0500) Weight:  [170 lb (77.111 kg)-170 lb 3.1 oz (77.2 kg)] 170 lb 3.1 oz (77.2 kg) (08/24 0500) Weight change:  Last BM Date: 05/25/14 Intake/Output from previous day: -1767 08/23 0701 - 08/24 0700 In: 33 [I.V.:33] Out: 1800 [Urine:1800] Intake/Output this shift:    PE: General:Pleasant affect, NAD Skin:Warm and dry, brisk capillary refill HEENT:normocephalic, sclera clear, mucus membranes moist Neck:supple, no JVD, no bruits  Heart:S1S2 RRR without murmur, gallup, rub or click Lungs:clear without rales, rhonchi, or wheezes YQM:GNOI, non tender, + BS, do not palpate liver spleen or masses Ext:no lower ext edema, 2+ pedal pulses, 2+ radial pulses Neuro:alert and oriented, MAE, follows commands, + facial symmetry TELE:  SB HR 52 down to 48 during sleep time  EKG SBrady without acute changes   Lab Results:  Recent Labs  05/26/14 1142 05/27/14 0315  WBC 4.1 4.4  HGB 12.6 11.9*  HCT 37.9 35.7*  PLT 193 191   BMET  Recent Labs  05/26/14 1142 05/27/14 0315  NA 133* 135*  K 5.7* 4.3  CL 98 101  CO2 24 23  GLUCOSE 89 103*  BUN 19 17  CREATININE 1.01 1.00  CALCIUM 9.0 8.6    Recent Labs  05/26/14 2113 05/27/14 0315  TROPONINI <0.30 <0.30    Lab Results  Component Value Date   CHOL 142 05/27/2014   HDL 73 05/27/2014   LDLCALC 56 05/27/2014   TRIG 65 05/27/2014   CHOLHDL 1.9 05/27/2014   Lab Results  Component Value Date   HGBA1C 5.5 05/26/2014     Lab Results  Component Value Date   TSH 2.910 05/26/2014    Hepatic Function Panel  Recent Labs  05/27/14 0315  PROT 6.5  ALBUMIN 3.5  AST 22  ALT 21  ALKPHOS 53  BILITOT 0.6     Recent Labs  05/27/14 0315  CHOL 142   No results found for this basename: PROTIME,  in the last 72 hours     Studies/Results: Dg Chest 2 View  05/26/2014   CLINICAL DATA:  Chest pain  EXAM: CHEST  2 VIEW  COMPARISON:  01/21/2014  FINDINGS: Cardiomediastinal silhouette is stable. Status post CABG. No acute infiltrate or pleural effusion. Mild elevation of the right hemidiaphragm again noted. No pulmonary edema. Bony thorax is stable.  IMPRESSION: No active cardiopulmonary disease.  Status post CABG.   Electronically Signed   By: Lahoma Crocker M.D.   On: 05/26/2014 13:11    Medications: I have reviewed the patient's current medications. Scheduled Meds: . aspirin  324 mg Oral NOW   Or  . aspirin  300 mg Rectal NOW  . aspirin EC  81 mg Oral Daily  . atorvastatin  80 mg Oral q1800  . clopidogrel  75 mg Oral Daily  . heparin  4,000 Units Intravenous Once  . irbesartan  75 mg Oral Daily  . isosorbide mononitrate  60 mg Oral Daily  . lubiprostone  24 mcg Oral QHS  . metoprolol tartrate  12.5 mg Oral Daily  . omega-3 acid ethyl esters  1 g Oral QID  . pantoprazole  40 mg  Oral Daily  . ranolazine  500 mg Oral BID  . sodium chloride  1,000 mL Intravenous Once  . sodium chloride  3 mL Intravenous Q12H  . sodium chloride  3 mL Intravenous Q12H   Continuous Infusions: . sodium chloride 1 mL/kg/hr (05/27/14 0400)  . heparin     PRN Meds:.sodium chloride, sodium chloride, acetaminophen, nitroGLYCERIN, ondansetron (ZOFRAN) IV, sodium chloride, sodium chloride  Assessment/Plan: 68 y/o woman with HTN, HL and CAD s/p CABG in 2010 with early graft failure presents to ER with CP.  Cath 12/10 showed early graft failure. Treated medically. With pain on admit similar to previous angina 5 sprays of NTG with resolution of the pain.   Principal Problem:   Intermediate coronary syndrome, neg MI, for cardiac cath today. Active Problems:   CORONARY ARTERY DISEASE, with CABG 2010 and graft  occl.at aorta and atretic LIMA   HLD (hyperlipidemia)- normally on crestor 20 mg with well controlled LDL, now on lipitor but would resume home crestor at discharge.   Hypertension- controlled.   Unstable angina    LOS: 1 day   Time spent with pt. :15 minutes. [facility] R  Nurse Practitioner Certified Pager [phone] or after 5pm and on weekends call [phone] 05/27/2014, 9:19 AM   Patient seen, examined. Available data reviewed. Agree with findings, assessment, and plan as outlined by [name], NP. Pt has been CP-free since admission. Sx's yesterday fairly concerning with chest pain/burning radiating to jaw requiring multiple NTG sprays. Known CAD and prior CABG. Last cath 2010 by Dr Gwenlyn Found reviewed. She had ostial LM stenosis (60%) and atretic LIMA-LAD. Native RCA stents had mild ISR. Plans for cath and possible PCI noted this am. Reviewed procedural risks, indications, and alternatives. She understands and agrees to proceed.  Sherren Mocha, M.D. 05/27/2014 10:37 AM

## 2014-05-27 NOTE — Discharge Summary (Signed)
Kristina Gibbs, M.D., [place], [facility], [place] [address] [place], [place]  [address]  [phone] 05/27/2014 5:41 PM

## 2014-05-27 NOTE — Interval H&P Note (Signed)
Cath Lab Visit (complete for each Cath Lab visit)  Clinical Evaluation Leading to the Procedure:   ACS: Yes.    Non-ACS:    Anginal Classification: CCS IV  Anti-ischemic medical therapy: Minimal Therapy (1 class of medications)  Non-Invasive Test Results: No non-invasive testing performed  Prior CABG: Previous CABG      History and Physical Interval Note:  05/27/2014 11:10 AM  Kristina Gibbs  has presented today for surgery, with the diagnosis of cp  The various methods of treatment have been discussed with the patient and family. After consideration of risks, benefits and other options for treatment, the patient has consented to  Procedure(s): LEFT HEART CATHETERIZATION WITH CORONARY ANGIOGRAM (N/A) as a surgical intervention .  The patient's history has been reviewed, patient examined, no change in status, stable for surgery.  I have reviewed the patient's chart and labs.  Questions were answered to the patient's satisfaction.     [name]

## 2014-05-27 NOTE — Progress Notes (Signed)
ANTICOAGULATION CONSULT NOTE - Follow Up Consult  Pharmacy Consult for heparin Indication: USAP  Labs:  Recent Labs  05/26/14 1142 05/26/14 1630 05/26/14 2113 05/26/14 2253 05/27/14 0315  HGB 12.6  --   --   --  11.9*  HCT 37.9  --   --   --  35.7*  PLT 193  --   --   --  191  LABPROT 13.0 14.9  --   --  14.0  INR 0.98 1.17  --   --  1.08  HEPARINUNFRC  --   --   --  0.45 0.42  CREATININE 1.01  --   --   --  1.00  TROPONINI  --  <0.30 <0.30  --  <0.30    Assessment/Plan:  68yo female therapeutic on heparin with initial dosing for USAP. Will continue gtt at current rate and follow up after cath  Thank you Anette Guarneri, PharmD  05/27/2014,10:08 AM

## 2014-05-27 NOTE — Discharge Instructions (Signed)
Call [facility] at [phone] if any bleeding, swelling or drainage at cath site.  May shower, no tub baths for 48 hours for groin sticks.   No lifting over 5 pounds for 3 days, no driving for 3 days.  We increased your protonix to twice a day for 2 weeks then back to once daily.    When you use your NTG spray only use 1-2 sprays then wait 5 min before using 1-2 sprays again.  Heart Healthy diet, call the office if any more pain and we would increase your imdur.    Angiogram, Care After Refer to this sheet in the next few weeks. These instructions provide you with information on caring for yourself after your procedure. Your health care provider may also give you more specific instructions. Your treatment has been planned according to current medical practices, but problems sometimes occur. Call your health care provider if you have any problems or questions after your procedure.  WHAT TO EXPECT AFTER THE PROCEDURE After your procedure, it is typical to have the following sensations:  Minor discomfort or tenderness and a small bump at the catheter insertion site. The bump should usually decrease in size and tenderness within 1 to 2 weeks.  Any bruising will usually fade within 2 to 4 weeks. HOME CARE INSTRUCTIONS   You may need to keep taking blood thinners if they were prescribed for you. Take medicines only as directed by your health care provider.  Do not apply powder or lotion to the site.  Do not take baths, swim, or use a hot tub until your health care provider approves.  You may shower 24 hours after the procedure. Remove the bandage (dressing) and gently wash the site with plain soap and water. Gently pat the site dry.  Inspect the site at least twice daily.  Limit your activity for the first 48 hours. Do not bend, squat, or lift anything over 20 lb (9 kg) or as directed by your health care provider.  Plan to have someone take you home after the procedure.  Follow instructions about when you can drive or return to work. SEEK MEDICAL CARE IF:  You get light-headed when standing up.  You have drainage (other than a small amount of blood on the dressing).  You have chills.  You have a fever.  You have redness, warmth, swelling, or pain at the insertion site. SEEK IMMEDIATE MEDICAL CARE IF:   You develop chest pain or shortness of breath, feel faint, or pass out.  You have bleeding, swelling larger than a walnut, or drainage from the catheter insertion site.  You develop pain, discoloration, coldness, or severe bruising in the leg or arm that held the catheter.  You develop bleeding from any other place, such as the bowels. You may see bright red blood in your urine or stools, or your stools may appear black and tarry.  You have heavy bleeding from the site. If this happens, hold pressure on the site. MAKE SURE YOU:  Understand these instructions.  Will watch your condition.  Will get help right away if you are not doing well or get worse. Document Released: 04/08/2005 Document Revised: 02/04/2014 Document Reviewed: 02/12/2013 [facility] Patient Information 2015 [place], [place]. This information is not intended to replace advice given to you by your health care provider. Make sure you discuss any questions you have with your health care provider.

## 2014-05-27 NOTE — Progress Notes (Signed)
Patient brought to Cath Lab holding for sheath removal. 28fr sheath removed, pressure held on site for 20 minutes by Waylan Rocher. Right pedal pulse present during sheath pull. No complaints of pain verbalized by patient. Patient verbalizes understanding of post sheath pull instructions.   Bed rest begins at 1225.

## 2014-05-30 ENCOUNTER — Telehealth: Payer: Self-pay | Admitting: Family Medicine

## 2014-05-30 MED ORDER — ALENDRONATE SODIUM 70 MG PO TABS: 70.0000 mg | ORAL_TABLET | ORAL | Status: DC

## 2014-05-30 NOTE — Telephone Encounter (Signed)
done

## 2014-06-04 ENCOUNTER — Telehealth: Payer: Self-pay | Admitting: Cardiovascular Disease

## 2014-06-04 NOTE — Telephone Encounter (Signed)
Returned a call to patient. She called because of concerns of a "small knot" in the groin area. Patient had a recent heart catherization . She denies pain, swelling, or drainage from the sight. She also denies redness. Informed the patient that is normal to get a small knot in the groin area. If she notices any of the above mentioned symptoms she is to call back for follow up. Otherwise keep scheduled post hospital visit .

## 2014-06-04 NOTE — Telephone Encounter (Signed)
Kristina Gibbs is calling because she had a cath a 1wk ago and she has a small knot in the area where the cath was done Please call .Marland Kitchen   Thanks

## 2014-06-13 ENCOUNTER — Ambulatory Visit (INDEPENDENT_AMBULATORY_CARE_PROVIDER_SITE_OTHER): Payer: Medicare Other | Admitting: Family Medicine

## 2014-06-13 ENCOUNTER — Encounter: Payer: Self-pay | Admitting: Family Medicine

## 2014-06-13 VITALS — BP 125/68 | HR 60 | Temp 97.7°F | Ht 63.0 in | Wt 174.0 lb

## 2014-06-13 DIAGNOSIS — R413 Other amnesia: Secondary | ICD-10-CM | POA: Diagnosis not present

## 2014-06-13 DIAGNOSIS — I10 Essential (primary) hypertension: Secondary | ICD-10-CM

## 2014-06-13 DIAGNOSIS — E785 Hyperlipidemia, unspecified: Secondary | ICD-10-CM

## 2014-06-13 NOTE — Progress Notes (Signed)
   Subjective:    Patient ID: Kristina Gibbs, female    DOB: [birth year], 68 y.o.   MRN: [id number]  HPI 68 year old female with a long history of coronary disease since her 16s. There is also a history of tobacco use and long-standing lipid problems. She had some chest pain recently. 911 was called and she was hospitalized. Cardiac catheterization 2 weeks ago showed no obstruction. Lipids done at that time showed everything to be at goal. She has had no further chest pain since that hospitalization. She is followed closely by Dr. Alvester Chou, cardiologist in [place]    Review of Systems  Constitutional: Negative.   HENT: Negative.   Eyes: Negative.   Respiratory: Negative.   Cardiovascular: Negative.   Gastrointestinal: Negative.   Endocrine: Negative.   Genitourinary: Negative.   Musculoskeletal: Positive for myalgias.       Bilateral thumb pain  Skin: Rash: not true rash but healing intertrigo.  Hematological: Negative.   Psychiatric/Behavioral: Negative.        Objective:   Physical Exam  Constitutional: She is oriented to person, place, and time. She appears well-developed and well-nourished.  Eyes: Conjunctivae and EOM are normal.  Neck: Normal range of motion. Neck supple.  Cardiovascular: Normal rate, regular rhythm and normal heart sounds.   Pulmonary/Chest: Effort normal and breath sounds normal.  Abdominal: Soft. Bowel sounds are normal.  Musculoskeletal: Normal range of motion.  Neurological: She is alert and oriented to person, place, and time. She has normal reflexes.  Skin: Skin is warm and dry.  Psychiatric: She has a normal mood and affect. Her behavior is normal. Thought content normal.    BP 125/68  Pulse 60  Temp(Src) 97.7 F (36.5 C) (Oral)  Ht 5\' 3"  (1.6 m)  Wt 174 lb (78.926 kg)  BMI 30.83 kg/m2      Assessment & Plan:  1. HLD (hyperlipidemia) At goal on statin  2. Essential hypertension BP good today  3. Memory impairment Seems to be  intact Does C/O hot flashes; discussed trying soy as substitute for estrogen  Wardell Honour MD

## 2014-06-21 ENCOUNTER — Ambulatory Visit (INDEPENDENT_AMBULATORY_CARE_PROVIDER_SITE_OTHER): Payer: Medicare Other | Admitting: Cardiology

## 2014-06-21 ENCOUNTER — Encounter: Payer: Self-pay | Admitting: Cardiology

## 2014-06-21 VITALS — BP 148/68 | HR 56 | Ht 63.0 in | Wt 175.5 lb

## 2014-06-21 DIAGNOSIS — I200 Unstable angina: Secondary | ICD-10-CM

## 2014-06-21 DIAGNOSIS — E785 Hyperlipidemia, unspecified: Secondary | ICD-10-CM

## 2014-06-21 DIAGNOSIS — I10 Essential (primary) hypertension: Secondary | ICD-10-CM

## 2014-06-21 DIAGNOSIS — R109 Unspecified abdominal pain: Secondary | ICD-10-CM | POA: Insufficient documentation

## 2014-06-21 DIAGNOSIS — I25810 Atherosclerosis of coronary artery bypass graft(s) without angina pectoris: Secondary | ICD-10-CM

## 2014-06-21 NOTE — Assessment & Plan Note (Signed)
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 LIMA patent.

## 2014-06-21 NOTE — Assessment & Plan Note (Signed)
Stable Lipid Panel     Component Value Date/Time   CHOL 142 05/27/2014 0315   TRIG 65 05/27/2014 0315   TRIG 102 12/15/2012 1125   HDL 73 05/27/2014 0315   HDL 59 02/20/2014 0812   CHOLHDL 1.9 05/27/2014 0315   CHOLHDL 2.4 02/20/2014 0812   VLDL 13 05/27/2014 0315   LDLCALC 56 05/27/2014 0315   LDLCALC 63 02/20/2014 0812   LDLCALC 78 12/15/2012 1125

## 2014-06-21 NOTE — Progress Notes (Signed)
06/21/2014   PCP: Wardell Honour, MD   Chief Complaint  Patient presents with  . Follow-up    pt denies chest pain, sob, and swelling    Primary Cardiologist:Dr. Adora Fridge   HPI:  68 year old mildly overweight Caucasian female with a history of CAD previously taken care of by Dr. Rollene Fare. She has a history of anterior wall myocardial infarction in 1995 with subsequent percutaneous PCIs to her RCA and LAD. She required multivessel bypass surgery by Dr. Gorden Harms in March of 2000 and LIMA to LAD, vein to a marginal branch of the circumflex and distal right coronary artery. Other problems include hypertension and hyperlipidemia. Dr. Adora Fridge  performed cardiac catheterization on her 2/12 revealing occluded vein grafts, patent LIMA and normal LV function. A Myoview stress test was normal done recently for chest pain and was normal. Her pain suddenly resolved and she thought it was anxiety.  Recently admitted 05/2014 for chest pain and cardiac cath completed, results the same as 2012.  This was not felt to be cardiac pain. Her protonix was increased and her pain has not returned.  She is back to one Protonix a day.  Today she is here for follow up and has no chest pain or SOB.  She does complain of 15 min. Of RUQ pain, she has had cholecystectomy. No nausea or SOB.    Allergies  Allergen Reactions  . Codeine Nausea And Vomiting  . Hydrocortisone Itching    Current Outpatient Prescriptions  Medication Sig Dispense Refill  . alendronate (FOSAMAX) 70 MG tablet Take 1 tablet (70 mg total) by mouth once a week. Take with a full glass of water on an empty stomach.  4 tablet  2  . aspirin EC 81 MG tablet Take 81 mg by mouth daily.      Marland Kitchen b complex vitamins tablet Take 2 tablets by mouth daily.      . calcium carbonate (OS-CAL) 600 MG TABS Take 600 mg by mouth daily.      . cholecalciferol (VITAMIN D) 1000 UNITS tablet Take 1,000 Units by mouth daily.      . clopidogrel  (PLAVIX) 75 MG tablet Take 1 tablet (75 mg total) by mouth daily.  30 tablet  11  . CO ENZYME Q-10 PO Take 1 tablet by mouth daily.      . irbesartan (AVAPRO) 75 MG tablet Take 75 mg by mouth daily.      . isosorbide mononitrate (IMDUR) 60 MG 24 hr tablet Take 60 mg by mouth daily.      Marland Kitchen lubiprostone (AMITIZA) 24 MCG capsule Take 24 mcg by mouth at bedtime.      . metoprolol tartrate (LOPRESSOR) 25 MG tablet Take 12.5 mg by mouth daily.      . nitroGLYCERIN (NITROLINGUAL) 0.4 MG/SPRAY spray Place 1 spray under the tongue every 5 (five) minutes as needed for chest pain.  12 g  2  . omega-3 acid ethyl esters (LOVAZA) 1 G capsule Take 1 g by mouth 4 (four) times daily.      . pantoprazole (PROTONIX) 40 MG tablet Take 1 tablet (40 mg total) by mouth 2 (two) times daily. Take twice a day for 2 weeks then back to once a day  45 tablet  6  . ranolazine (RANEXA) 500 MG 12 hr tablet Take 1 tablet (500 mg total) by mouth 2 (two) times daily.  60 tablet  6  . rosuvastatin (CRESTOR)  20 MG tablet Take 20 mg by mouth daily.      . sodium chloride (OCEAN) 0.65 % SOLN nasal spray Place 1 spray into both nostrils as needed for congestion.      . triamterene-hydrochlorothiazide (DYAZIDE) 37.5-25 MG per capsule Take 1 capsule by mouth daily as needed. For fluid retention.      . vitamin C (ASCORBIC ACID) 500 MG tablet Take 1,000 mg by mouth daily.        No current facility-administered medications for this visit.    Past Medical History  Diagnosis Date  . Hypercholesteremia   . Memory impairment      MMSE 27/30  . Cancer     ovarian  . Arthritis   . Allergy   . Osteopenia   . Coronary artery disease   . Hypertension   . Unstable angina 05/27/2014    Past Surgical History  Procedure Laterality Date  . Coronary artery bypass graft    . Abdominal hysterectomy    . Cholecystectomy      BMW:UXLKGMW:NU colds or fevers, mild increase of weight  Skin:no rashes or ulcers HEENT:no blurred vision, no  congestion CV:see HPI PUL:see HPI GI:no diarrhea constipation or melena, no indigestion GU:no hematuria, no dysuria MS:no joint pain, no claudication Neuro:no syncope, no lightheadedness Endo:no diabetes, no thyroid disease  Wt Readings from Last 3 Encounters:  06/21/14 175 lb 8 oz (79.606 kg)  06/13/14 174 lb (78.926 kg)  05/27/14 170 lb 3.1 oz (77.2 kg)    PHYSICAL EXAM BP 148/68  Pulse 56  Ht 5\' 3"  (1.6 m)  Wt 175 lb 8 oz (79.606 kg)  BMI 31.10 kg/m2 General:Pleasant affect, NAD Skin:Warm and dry, brisk capillary refill HEENT:normocephalic, sclera clear, mucus membranes moist Neck:supple, no JVD, no bruits  Heart:S1S2 RRR without murmur, gallup, rub or click Lungs:clear without rales, rhonchi, or wheezes UVO:ZDGU, mild to moderate tenderness both Rt and Lt upper quad. Of abd., + BS, do not palpate liver spleen or masses Ext:no lower ext edema, 2+ pedal pulses, 2+ radial pulses Neuro:alert and oriented, MAE, follows commands, + facial symmetry YQI:HKVQQ Brady, rate 56 no acute changes.  ASSESSMENT AND PLAN Intermediate coronary syndrome, stable LIMA-LAD at cath, no other changes continues with VG occlusion.  May be GI source Recent hospitalization for chest pain with cath results as above.  Negative for MI. She increased her PPI and her pain has resolved.  No further chest pain.  Will follow with Dr. 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 LIMA patent.  HLD (hyperlipidemia) Stable Lipid Panel     Component Value Date/Time   CHOL 142 05/27/2014 0315   TRIG 65 05/27/2014 0315   TRIG 102 12/15/2012 1125   HDL 73 05/27/2014 0315   HDL 59 02/20/2014 0812   CHOLHDL 1.9 05/27/2014 0315   CHOLHDL 2.4 02/20/2014 0812   VLDL 13 05/27/2014 0315   LDLCALC 56 05/27/2014 0315   LDLCALC 63 02/20/2014 0812   LDLCALC 78 12/15/2012 1125      Hypertension Stable   Abdominal pain of unknown etiology, episodic Asked  pt to follow up with her PCP   Follow up with Dr. Adora Fridge in 6 months

## 2014-06-21 NOTE — Assessment & Plan Note (Signed)
Stable

## 2014-06-21 NOTE — Assessment & Plan Note (Signed)
Asked pt to follow up with her PCP

## 2014-06-21 NOTE — Assessment & Plan Note (Signed)
Recent hospitalization for chest pain with cath results as above.  Negative for MI. She increased her PPI and her pain has resolved.  No further chest pain.  Will follow with Dr. Gwenlyn Found in 6 months

## 2014-06-21 NOTE — Patient Instructions (Signed)
Continue same medications   Your physician wants you to follow-up in: 6 months with Dr.Berry. You will receive a reminder letter in the mail two months in advance. If you don't receive a letter, please call our office to schedule the follow-up appointment.  

## 2014-07-02 ENCOUNTER — Ambulatory Visit (INDEPENDENT_AMBULATORY_CARE_PROVIDER_SITE_OTHER): Payer: Medicare Other | Admitting: Family

## 2014-07-02 DIAGNOSIS — Z23 Encounter for immunization: Secondary | ICD-10-CM

## 2014-07-02 NOTE — Progress Notes (Signed)
Patient ID: Kristina Gibbs, female   DOB: 1946-03-17, 68 y.o.   MRN: [id number] Flu shot given today

## 2014-07-03 ENCOUNTER — Telehealth: Payer: Self-pay | Admitting: Family Medicine

## 2014-07-03 MED ORDER — OMEGA-3-ACID ETHYL ESTERS 1 G PO CAPS: 1.0000 g | ORAL_CAPSULE | Freq: Four times a day (QID) | ORAL | Status: DC

## 2014-07-03 NOTE — Telephone Encounter (Signed)
done

## 2014-07-10 ENCOUNTER — Telehealth: Payer: Self-pay | Admitting: Nurse Practitioner

## 2014-07-12 ENCOUNTER — Other Ambulatory Visit: Payer: Self-pay | Admitting: Cardiovascular Disease

## 2014-07-15 MED ORDER — TRIAMTERENE-HCTZ 37.5-25 MG PO CAPS: 1.0000 | ORAL_CAPSULE | Freq: Every day | ORAL | Status: DC | PRN

## 2014-07-15 NOTE — Telephone Encounter (Signed)
Rx was sent to pharmacy electronically. 

## 2014-07-15 NOTE — Telephone Encounter (Signed)
Pt called in needing a new prescription for her Triamterene-HCTZ called in the the [facility] in [place]. Please call  Thanks

## 2014-08-14 NOTE — Telephone Encounter (Signed)
Was noted in chart

## 2014-08-26 ENCOUNTER — Other Ambulatory Visit: Payer: Self-pay | Admitting: Family

## 2014-09-03 ENCOUNTER — Encounter: Payer: Self-pay | Admitting: Cardiology

## 2014-09-03 ENCOUNTER — Ambulatory Visit (INDEPENDENT_AMBULATORY_CARE_PROVIDER_SITE_OTHER): Payer: Medicare Other | Admitting: Cardiology

## 2014-09-03 VITALS — BP 136/80 | HR 54 | Ht 63.0 in | Wt 173.9 lb

## 2014-09-03 DIAGNOSIS — I2510 Atherosclerotic heart disease of native coronary artery without angina pectoris: Secondary | ICD-10-CM

## 2014-09-03 DIAGNOSIS — I10 Essential (primary) hypertension: Secondary | ICD-10-CM

## 2014-09-03 DIAGNOSIS — I25810 Atherosclerosis of coronary artery bypass graft(s) without angina pectoris: Secondary | ICD-10-CM

## 2014-09-03 DIAGNOSIS — E785 Hyperlipidemia, unspecified: Secondary | ICD-10-CM

## 2014-09-03 DIAGNOSIS — K5900 Constipation, unspecified: Secondary | ICD-10-CM

## 2014-09-03 NOTE — Assessment & Plan Note (Signed)
controlled 

## 2014-09-03 NOTE — Assessment & Plan Note (Signed)
history of anterior wall myocardial infarction in 1995 with subsequent percutaneous PCIs to her RCA and LAD. She required multivessel bypass surgery by Dr. Gorden Harms in March of 2000 and LIMA to LAD, vein to a marginal branch of the circumflex and distal right coronary artery.  Last cath 05/2014 with stable anatomy. No complaints today.

## 2014-09-03 NOTE — Progress Notes (Signed)
09/03/2014   PCP: Wardell Honour, MD   Chief Complaint  Patient presents with  . ROV 6 months    No complaints.    Primary Cardiologist:Dr. Adora Fridge  HPI:  68 year old mildly overweight Caucasian female with a history of CAD previously taken care of by Dr. Rollene Fare. She has a history of anterior wall myocardial infarction in 1995 with subsequent percutaneous PCIs to her RCA and LAD. She required multivessel bypass surgery by Dr. Gorden Harms in March of 2000 and LIMA to LAD, vein to a marginal branch of the circumflex and distal right coronary artery. Other problems include hypertension and hyperlipidemia. Dr. Adora Fridge performed cardiac catheterization on her 2/12 revealing occluded vein grafts, patent LIMA and normal LV function. A Myoview stress test was normal done recently for chest pain and was normal. Her pain suddenly resolved and she thought it was anxiety.  Recently admitted 05/2014 for chest pain and cardiac cath completed, results the same as 2012. This was not felt to be cardiac pain. Her protonix was increased and her pain has not returned. She is back to one Protonix a day. Today she is here for follow up and has no chest pain or SOB. She does complain of 15 min. Of RUQ pain, she has had cholecystectomy. No nausea or SOB.   She is back today for follow up.  No complaints is doing well.EKG is stable only complaint is constipation.   Allergies  Allergen Reactions  . Codeine Nausea And Vomiting  . Hydrocortisone Itching    Current Outpatient Prescriptions  Medication Sig Dispense Refill  . alendronate (FOSAMAX) 70 MG tablet TAKE 1 TABLET BY MOUTH ONCE A WEEK. TAKE WITH A FULL GLASS OF WATER ON AN EMPTY STOMACH. 4 tablet 2  . aspirin EC 81 MG tablet Take 81 mg by mouth daily.    Marland Kitchen b complex vitamins tablet Take 2 tablets by mouth daily.    . calcium carbonate (OS-CAL) 600 MG TABS Take 600 mg by mouth daily.    . cholecalciferol (VITAMIN D) 1000 UNITS  tablet Take 1,000 Units by mouth daily.    . clopidogrel (PLAVIX) 75 MG tablet Take 1 tablet (75 mg total) by mouth daily. 30 tablet 11  . CO ENZYME Q-10 PO Take 1 tablet by mouth daily.    . fluticasone (FLONASE) 50 MCG/ACT nasal spray Place 2 sprays into both nostrils daily as needed.  6  . irbesartan (AVAPRO) 75 MG tablet Take 75 mg by mouth daily.    . isosorbide mononitrate (IMDUR) 60 MG 24 hr tablet Take 60 mg by mouth daily.    Marland Kitchen lubiprostone (AMITIZA) 24 MCG capsule Take 24 mcg by mouth at bedtime.    . metoprolol succinate (TOPROL-XL) 25 MG 24 hr tablet Take 0.5 tablets (12.5 mg total) by mouth daily. 30 tablet 3  . metoprolol tartrate (LOPRESSOR) 25 MG tablet Take 12.5 mg by mouth daily.    . nitroGLYCERIN (NITROLINGUAL) 0.4 MG/SPRAY spray Place 1 spray under the tongue every 5 (five) minutes as needed for chest pain. 12 g 2  . omega-3 acid ethyl esters (LOVAZA) 1 G capsule Take 1 capsule (1 g total) by mouth 4 (four) times daily. 120 capsule 1  . pantoprazole (PROTONIX) 40 MG tablet Take 1 tablet (40 mg total) by mouth 2 (two) times daily. Take twice a day for 2 weeks then back to once a day 45 tablet 6  . PATADAY 0.2 %  SOLN Place 1 drop into both eyes daily as needed.  6  . ranolazine (RANEXA) 500 MG 12 hr tablet Take 1 tablet (500 mg total) by mouth 2 (two) times daily. 60 tablet 6  . rosuvastatin (CRESTOR) 20 MG tablet Take 20 mg by mouth daily.    . sodium chloride (OCEAN) 0.65 % SOLN nasal spray Place 1 spray into both nostrils as needed for congestion.    . triamterene-hydrochlorothiazide (DYAZIDE) 37.5-25 MG per capsule Take 1 each (1 capsule total) by mouth daily as needed. For fluid retention. 30 capsule 6  . vitamin C (ASCORBIC ACID) 500 MG tablet Take 1,000 mg by mouth daily.      No current facility-administered medications for this visit.    Past Medical History  Diagnosis Date  . Hypercholesteremia   . Memory impairment      MMSE 27/30  . Cancer     ovarian  .  Arthritis   . Allergy   . Osteopenia   . Coronary artery disease   . Hypertension   . Unstable angina 05/27/2014    Past Surgical History  Procedure Laterality Date  . Coronary artery bypass graft    . Abdominal hysterectomy    . Cholecystectomy      ZES:PQZRAQT:MA colds or fevers, no weight changes Skin:no rashes or ulcers HEENT:no blurred vision, no congestion CV:see HPI PUL:see HPI GI:no diarrhea +constipation no melena, no indigestion GU:no hematuria, no dysuria MS:no joint pain, no claudication Neuro:no syncope, no lightheadedness Endo:no diabetes, no thyroid disease  Wt Readings from Last 3 Encounters:  09/03/14 173 lb 14.4 oz (78.881 kg)  06/21/14 175 lb 8 oz (79.606 kg)  06/13/14 174 lb (78.926 kg)    PHYSICAL EXAM BP 136/80 mmHg  Pulse 54  Ht 5\' 3"  (1.6 m)  Wt 173 lb 14.4 oz (78.881 kg)  BMI 30.81 kg/m2 General:Pleasant affect, NAD Skin:Warm and dry, brisk capillary refill HEENT:normocephalic, sclera clear, mucus membranes moist Neck:supple, no JVD, no bruits  Heart:S1S2 RRR without murmur, gallup, rub or click Lungs:clear without rales, rhonchi, or wheezes UQJ:FHLK, non tender, + BS, do not palpate liver spleen or masses Ext:no lower ext edema, 2+ pedal pulses, 2+ radial pulses Neuro:alert and oriented, MAE, follows commands, + facial symmetry EKG:S Brady no acute changes. Rate 54  ASSESSMENT AND PLAN Coronary atherosclerosis history of anterior wall myocardial infarction in 1995 with subsequent percutaneous PCIs to her RCA and LAD. She required multivessel bypass surgery by Dr. Gorden Harms in March of 2000 and LIMA to LAD, vein to a marginal branch of the circumflex and distal right coronary artery.  Last cath 05/2014 with stable anatomy. No complaints today.   Hypertension controlled  CAD (coronary artery disease) of artery bypass graft stable  Constipation Discussed several ways to treat though she is doing most.  She will discuss with  PCP.  HLD (hyperlipidemia) Controlled on current therapy.  Will recheck in 6 months.   FOLLOW UP WITH Dr. Adora Fridge IN 6 MONTHS

## 2014-09-03 NOTE — Assessment & Plan Note (Signed)
Discussed several ways to treat though she is doing most.  She will discuss with PCP.

## 2014-09-03 NOTE — Patient Instructions (Signed)
Your physician recommends that you schedule a follow-up appointment in:6 months with Dr Berry 

## 2014-09-03 NOTE — Assessment & Plan Note (Signed)
stable °

## 2014-09-03 NOTE — Assessment & Plan Note (Signed)
Controlled on current therapy.  Will recheck in 6 months.

## 2014-09-04 ENCOUNTER — Telehealth: Payer: Self-pay | Admitting: Cardiovascular Disease

## 2014-09-04 ENCOUNTER — Other Ambulatory Visit: Payer: Self-pay | Admitting: Family Medicine

## 2014-09-05 NOTE — Telephone Encounter (Signed)
Rx refill sent to patient pharmacy   

## 2014-09-11 ENCOUNTER — Other Ambulatory Visit: Payer: Self-pay | Admitting: Cardiovascular Disease

## 2014-09-12 ENCOUNTER — Encounter (HOSPITAL_COMMUNITY): Payer: Self-pay | Admitting: Cardiovascular Disease

## 2014-09-12 NOTE — Telephone Encounter (Signed)
Spoke with patient to clarify dosage of Ranexa. Patient takes 500mg  BID.

## 2014-09-14 ENCOUNTER — Other Ambulatory Visit: Payer: Self-pay | Admitting: Cardiovascular Disease

## 2014-09-16 NOTE — Telephone Encounter (Signed)
Rx refill sent to patient pharmacy   

## 2014-09-20 ENCOUNTER — Ambulatory Visit: Payer: Medicare Other | Admitting: Family Medicine

## 2014-09-22 ENCOUNTER — Other Ambulatory Visit: Payer: Self-pay | Admitting: Cardiovascular Disease

## 2014-09-23 NOTE — Telephone Encounter (Signed)
Rx(s) sent to pharmacy electronically.  

## 2014-10-19 ENCOUNTER — Other Ambulatory Visit: Payer: Self-pay | Admitting: Cardiovascular Disease

## 2014-10-30 ENCOUNTER — Ambulatory Visit (INDEPENDENT_AMBULATORY_CARE_PROVIDER_SITE_OTHER): Payer: Medicare Other | Admitting: Family Medicine

## 2014-10-30 ENCOUNTER — Encounter: Payer: Self-pay | Admitting: Family Medicine

## 2014-10-30 VITALS — BP 132/49 | HR 64 | Temp 97.1°F | Ht 63.0 in | Wt 176.0 lb

## 2014-10-30 DIAGNOSIS — E785 Hyperlipidemia, unspecified: Secondary | ICD-10-CM

## 2014-10-30 DIAGNOSIS — I10 Essential (primary) hypertension: Secondary | ICD-10-CM

## 2014-10-30 DIAGNOSIS — I200 Unstable angina: Secondary | ICD-10-CM | POA: Diagnosis not present

## 2014-10-30 DIAGNOSIS — R109 Unspecified abdominal pain: Secondary | ICD-10-CM

## 2014-10-30 NOTE — Patient Instructions (Signed)
Take Sena S 2 a day and increase if needed.  Constipation Constipation is when a person has fewer than three bowel movements a week, has difficulty having a bowel movement, or has stools that are dry, hard, or larger than normal. As people grow older, constipation is more common. If you try to fix constipation with medicines that make you have a bowel movement (laxatives), the problem may get worse. Long-term laxative use may cause the muscles of the colon to become weak. A low-fiber diet, not taking in enough fluids, and taking certain medicines may make constipation worse.  CAUSES   Certain medicines, such as antidepressants, pain medicine, iron supplements, antacids, and water pills.   Certain diseases, such as diabetes, irritable bowel syndrome (IBS), thyroid disease, or depression.   Not drinking enough water.   Not eating enough fiber-rich foods.   Stress or travel.   Lack of physical activity or exercise.   Ignoring the urge to have a bowel movement.   Using laxatives too much.  SIGNS AND SYMPTOMS   Having fewer than three bowel movements a week.   Straining to have a bowel movement.   Having stools that are hard, dry, or larger than normal.   Feeling full or bloated.   Pain in the lower abdomen.   Not feeling relief after having a bowel movement.  DIAGNOSIS  Your health care provider will take a medical history and perform a physical exam. Further testing may be done for severe constipation. Some tests may include:  A barium enema X-ray to examine your rectum, colon, and, sometimes, your small intestine.   A sigmoidoscopy to examine your lower colon.   A colonoscopy to examine your entire colon. TREATMENT  Treatment will depend on the severity of your constipation and what is causing it. Some dietary treatments include drinking more fluids and eating more fiber-rich foods. Lifestyle treatments may include regular exercise. If these diet and lifestyle  recommendations do not help, your health care provider may recommend taking over-the-counter laxative medicines to help you have bowel movements. Prescription medicines may be prescribed if over-the-counter medicines do not work.  HOME CARE INSTRUCTIONS   Eat foods that have a lot of fiber, such as fruits, vegetables, whole grains, and beans.  Limit foods high in fat and processed sugars, such as french fries, hamburgers, cookies, candies, and soda.   A fiber supplement may be added to your diet if you cannot get enough fiber from foods.   Drink enough fluids to keep your urine clear or pale yellow.   Exercise regularly or as directed by your health care provider.   Go to the restroom when you have the urge to go. Do not hold it.   Only take over-the-counter or prescription medicines as directed by your health care provider. Do not take other medicines for constipation without talking to your health care provider first.  [place] IF:   You have bright red blood in your stool.   Your constipation lasts for more than 4 days or gets worse.   You have abdominal or rectal pain.   You have thin, pencil-like stools.   You have unexplained weight loss. MAKE SURE YOU:   Understand these instructions.  Will watch your condition.  Will get help right away if you are not doing well or get worse. Document Released: 06/18/2004 Document Revised: 09/25/2013 Document Reviewed: 07/02/2013 [facility] Patient Information 2015 [place], [place]. This information is not intended to replace advice given to you  by your health care provider. Make sure you discuss any questions you have with your health care provider.

## 2014-10-30 NOTE — Progress Notes (Signed)
Subjective:    Patient ID: Kristina Gibbs, female    DOB: 1945-11-07, 69 y.o.   MRN: [id number]  HPI  69 year old female comes in today to discuss chronic medical conditions which include hypertension and hyperlipidemia. She also complains of RUQ abd pain and bloating for greater than one month. She would like to see a gastroenterologist who can perform a virtual colonoscopy. She has been seen by the [facility] but her cardiologist would not clear her for a colonoscopy with sedation.  Constipation has been a chronic problem since she had hysterectomy at an early age. I suspect it may be related to some adhesions and scar tissue. Currently taking Amitiza with minimal success   Patient Active Problem List   Diagnosis Date Noted  . CAD (coronary artery disease) of artery bypass graft 06/21/2014  . Abdominal pain of unknown etiology, episodic 06/21/2014  . Unstable angina 05/27/2014  . Intermediate coronary syndrome, stable LIMA-LAD at cath, no other changes continues with VG occlusion.  May be GI source 05/26/2014  . Hypertension 02/22/2014  . Chest congestion 01/21/2014  . Wheeze 01/21/2014  . Cough 01/21/2014  . Seasonal allergic rhinitis 01/21/2014  . Angina at rest 12/28/2013  . Epistaxis 11/12/2013  . Breast lump in upper outer quadrant 04/17/2013  . Memory impairment 04/17/2013  . Pruritus 04/17/2013  . Chest pain 06/05/2012  . HLD (hyperlipidemia) 06/05/2012  . Coronary atherosclerosis 08/31/2010  . HEMORRHOIDS-INTERNAL 08/31/2010  . GERD 08/31/2010  . Constipation 08/31/2010  . RECTAL BLEEDING 08/31/2010   Outpatient Encounter Prescriptions as of 10/30/2014  Medication Sig  . alendronate (FOSAMAX) 70 MG tablet TAKE 1 TABLET BY MOUTH ONCE A WEEK. TAKE WITH A FULL GLASS OF WATER ON AN EMPTY STOMACH.  Marland Kitchen aspirin EC 81 MG tablet Take 81 mg by mouth daily.  Marland Kitchen b complex vitamins tablet Take 2 tablets by mouth daily.  . calcium carbonate (OS-CAL) 600 MG TABS Take 600 mg  by mouth daily.  . cholecalciferol (VITAMIN D) 1000 UNITS tablet Take 1,000 Units by mouth daily.  . clopidogrel (PLAVIX) 75 MG tablet Take 1 tablet (75 mg total) by mouth daily.  . CO ENZYME Q-10 PO Take 1 tablet by mouth daily.  . CRESTOR 20 MG tablet TAKE 1 TABLET BY MOUTH EVERY DAY AS DIRECTED  . irbesartan (AVAPRO) 75 MG tablet Take 1 tablet (75 mg total) by mouth at bedtime.  . isosorbide mononitrate (IMDUR) 60 MG 24 hr tablet TAKE 1 TABLET BY MOUTH DAILY  . lubiprostone (AMITIZA) 24 MCG capsule Take 24 mcg by mouth at bedtime.  . metoprolol succinate (TOPROL-XL) 25 MG 24 hr tablet Take 0.5 tablets (12.5 mg total) by mouth daily.  . metoprolol tartrate (LOPRESSOR) 25 MG tablet Take 12.5 mg by mouth daily.  . nitroGLYCERIN (NITROLINGUAL) 0.4 MG/SPRAY spray Place 1 spray under the tongue every 5 (five) minutes as needed for chest pain.  Marland Kitchen omega-3 acid ethyl esters (LOVAZA) 1 G capsule TAKE ONE CAPSULE BY MOUTH 4 TIMES A DAY  . pantoprazole (PROTONIX) 40 MG tablet Take 1 tablet (40 mg total) by mouth 2 (two) times daily. Take twice a day for 2 weeks then back to once a day  . PATADAY 0.2 % SOLN Place 1 drop into both eyes daily as needed.  . ranolazine (RANEXA) 500 MG 12 hr tablet Take 1 tablet (500 mg total) by mouth 2 (two) times daily.  . rosuvastatin (CRESTOR) 20 MG tablet Take 20 mg by mouth daily.  Marland Kitchen  sodium chloride (OCEAN) 0.65 % SOLN nasal spray Place 1 spray into both nostrils as needed for congestion.  . triamterene-hydrochlorothiazide (DYAZIDE) 37.5-25 MG per capsule Take 1 each (1 capsule total) by mouth daily as needed. For fluid retention.  . vitamin C (ASCORBIC ACID) 500 MG tablet Take 1,000 mg by mouth daily.   . [DISCONTINUED] fluticasone (FLONASE) 50 MCG/ACT nasal spray Place 2 sprays into both nostrils daily as needed.  . [DISCONTINUED] isosorbide mononitrate (IMDUR) 60 MG 24 hr tablet Take 60 mg by mouth daily.  . [DISCONTINUED] pantoprazole (PROTONIX) 40 MG tablet TAKE 1  TABLET (40 MG TOTAL) BY MOUTH DAILY.  . [DISCONTINUED] ranolazine (RANEXA) 500 MG 12 hr tablet Take 1 tablet (500 mg total) by mouth 2 (two) times daily.     Review of Systems  Respiratory: Negative.   Cardiovascular: Negative.   Gastrointestinal: Positive for abdominal pain.  Genitourinary: Negative.        Objective:   Physical Exam  Constitutional: She appears well-developed and well-nourished.  Cardiovascular: Normal rate and regular rhythm.   Pulmonary/Chest: Effort normal and breath sounds normal.  Abdominal: Soft. Bowel sounds are normal.   BP 132/49 mmHg  Pulse 64  Temp(Src) 97.1 F (36.2 C) (Oral)  Ht _0  (1.6 m)  Wt 176 lb (79.833 kg)  BMI 31.18 kg/m2        Assessment & Plan:  1. Intermediate coronary syndrome, stable LIMA-LAD at cath, no other changes continues with VG occlusion.  May be GI source   2. Essential hypertension   3. Abdominal pain of unknown etiology, episodic Suggested senna S; begin with 2 per day and titrate up to a max of 8 per day. Patient concerned about her liver. 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 HLD (hyperlipidemia)   Wardell Honour MD - CMP14+EGFR - Lipid panel

## 2014-10-31 LAB — LIPID PANEL
Chol/HDL Ratio: 2.2 ratio units (ref 0.0–4.4)
Cholesterol, Total: 170 mg/dL (ref 100–199)
HDL: 77 mg/dL (ref >39)
LDL CALC: 75 mg/dL (ref 0–99)
TRIGLYCERIDES: 91 mg/dL (ref 0–149)
VLDL Cholesterol Cal: 18 mg/dL (ref 5–40)

## 2014-10-31 LAB — CMP14+EGFR
A/G RATIO: 1.8 (ref 1.1–2.5)
ALK PHOS: 55 IU/L (ref 39–117)
ALT: 27 IU/L (ref 0–32)
AST: 23 IU/L (ref 0–40)
Albumin: 4.2 g/dL (ref 3.5–4.8)
BUN/Creatinine Ratio: 13 (ref 11–26)
BUN: 14 mg/dL (ref 8–27)
CALCIUM: 9.2 mg/dL (ref 8.7–10.3)
CO2: 25 mmol/L (ref 18–29)
Chloride: 99 mmol/L (ref 97–108)
Creatinine, Ser: 1.1 mg/dL — ABNORMAL HIGH (ref 0.57–1.00)
GFR calc Af Amer: 59 mL/min/{1.73_m2} — ABNORMAL LOW (ref >59)
GFR calc non Af Amer: 51 mL/min/{1.73_m2} — ABNORMAL LOW (ref >59)
Globulin, Total: 2.4 g/dL (ref 1.5–4.5)
Glucose: 94 mg/dL (ref 65–99)
Potassium: 4.8 mmol/L (ref 3.5–5.2)
Sodium: 137 mmol/L (ref 134–144)
TOTAL PROTEIN: 6.6 g/dL (ref 6.0–8.5)
Total Bilirubin: 0.6 mg/dL (ref 0.0–1.2)

## 2014-11-05 ENCOUNTER — Other Ambulatory Visit: Payer: Self-pay | Admitting: Cardiovascular Disease

## 2014-11-05 ENCOUNTER — Other Ambulatory Visit: Payer: Self-pay | Admitting: Family

## 2014-11-05 NOTE — Telephone Encounter (Signed)
Rx(s) sent to pharmacy electronically.  

## 2014-12-01 ENCOUNTER — Other Ambulatory Visit: Payer: Self-pay | Admitting: Family

## 2015-01-13 ENCOUNTER — Ambulatory Visit (INDEPENDENT_AMBULATORY_CARE_PROVIDER_SITE_OTHER): Payer: Medicare Other | Admitting: Family

## 2015-01-13 ENCOUNTER — Encounter: Payer: Self-pay | Admitting: Family

## 2015-01-13 VITALS — BP 141/85 | HR 56 | Temp 97.3°F | Ht 63.0 in | Wt 175.4 lb

## 2015-01-13 DIAGNOSIS — J019 Acute sinusitis, unspecified: Secondary | ICD-10-CM

## 2015-01-13 MED ORDER — METHYLPREDNISOLONE (PAK) 4 MG PO TABS: ORAL_TABLET | ORAL | Status: DC

## 2015-01-13 MED ORDER — BENZONATATE 200 MG PO CAPS: 200.0000 mg | ORAL_CAPSULE | Freq: Three times a day (TID) | ORAL | Status: DC | PRN

## 2015-01-13 MED ORDER — AMOXICILLIN-POT CLAVULANATE 875-125 MG PO TABS: 1.0000 | ORAL_TABLET | Freq: Two times a day (BID) | ORAL | Status: DC

## 2015-01-13 NOTE — Progress Notes (Signed)
Subjective:    Patient ID: Kristina Gibbs, female    DOB: 1945-11-09, 69 y.o.   MRN: [id number]  Cough This is a new problem. The current episode started 1 to 4 weeks ago (Two weeks). The problem has been gradually worsening. The problem occurs every few minutes. The cough is non-productive. Associated symptoms include ear congestion, ear pain, a fever, headaches, nasal congestion, rhinorrhea, a sore throat and shortness of breath. Pertinent negatives include no chills, hemoptysis or wheezing. The symptoms are aggravated by lying down and pollens. 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 The treatment provided mild relief. There is no history of asthma or COPD.      Review of Systems  Constitutional: Positive for fever. Negative for chills.  HENT: Positive for ear pain, rhinorrhea and sore throat.   Eyes: Negative.   Respiratory: Positive for cough and shortness of breath. Negative for hemoptysis and wheezing.   Cardiovascular: Negative.  Negative for palpitations.  Gastrointestinal: Negative.   Endocrine: Negative.   Genitourinary: Negative.   Musculoskeletal: Negative.   Neurological: Positive for headaches.  Hematological: Negative.   Psychiatric/Behavioral: Negative.   All other systems reviewed and are negative.      Objective:   Physical Exam  Constitutional: She is oriented to person, place, and time. She appears well-developed and well-nourished. No distress.  HENT:  Head: Normocephalic and atraumatic.  Right Ear: External ear normal.  Left Ear: External ear normal.  Nose: Right sinus exhibits maxillary sinus tenderness and frontal sinus tenderness. Left sinus exhibits maxillary sinus tenderness and frontal sinus tenderness.  Nasal passage erythemas with mild swelling  Oropharynx erythemas   Eyes: Pupils are equal, round, and reactive to light.  Neck: Normal range of motion. Neck supple. No thyromegaly present.  Cardiovascular: Normal rate, regular  rhythm, normal heart sounds and intact distal pulses.   No murmur heard. Pulmonary/Chest: Effort normal and breath sounds normal. No respiratory distress. She has no wheezes.  Abdominal: Soft. Bowel sounds are normal. She exhibits no distension. There is no tenderness.  Musculoskeletal: Normal range of motion. She exhibits no edema or tenderness.  Neurological: She is alert and oriented to person, place, and time. She has normal reflexes. No cranial nerve deficit.  Skin: Skin is warm and dry.  Psychiatric: She has a normal mood and affect. Her behavior is normal. Judgment and thought content normal.  Vitals reviewed.   BP 141/85 mmHg  Pulse 56  Temp(Src) 97.3 F (36.3 C) (Oral)  Ht 5\' 3"  (1.6 m)  Wt 175 lb 6.4 oz (79.561 kg)  BMI 31.08 kg/m2       Assessment & Plan:  1. Acute sinusitis, recurrence not specified, unspecified location -- Take meds as prescribed - Use a cool mist humidifier  -Use saline nose sprays frequently -Saline irrigations of the nose can be very helpful if done frequently.  * 4X daily for 1 week*  * Use of a nettie pot can be helpful with this. Follow directions with this* -Force fluids -For any cough or congestion  Use plain Mucinex- regular strength or max strength is fine   * Children- consult with Pharmacist for dosing -For fever or aces or pains- take tylenol or ibuprofen appropriate for age and weight.  * for fevers greater than 101 orally you may alternate ibuprofen and tylenol every  3 hours. -Throat lozenges if help - amoxicillin-clavulanate (AUGMENTIN) 875-125 MG per tablet; Take 1 tablet by mouth 2 (two) times daily.  Dispense: 14 tablet;  Refill: 0 - methylPREDNIsolone (MEDROL DOSPACK) 4 MG tablet; follow package directions  Dispense: 21 tablet; Refill: 0 - benzonatate (TESSALON) 200 MG capsule; Take 1 capsule (200 mg total) by mouth 3 (three) times daily as needed.  Dispense: 30 capsule; Refill: [place], FNP

## 2015-01-13 NOTE — Patient Instructions (Signed)
Sinusitis Sinusitis is redness, soreness, and inflammation of the paranasal sinuses. Paranasal sinuses are air pockets within the bones of your face (beneath the eyes, the middle of the forehead, or above the eyes). In healthy paranasal sinuses, mucus is able to drain out, and air is able to circulate through them by way of your nose. However, when your paranasal sinuses are inflamed, mucus and air can become trapped. This can allow bacteria and other germs to grow and cause infection. Sinusitis can develop quickly and last only a short time (acute) or continue over a long period (chronic). Sinusitis that lasts for more than 12 weeks is considered chronic.  CAUSES  Causes of sinusitis include:  Allergies.  Structural abnormalities, such as displacement of the cartilage that separates your nostrils (deviated septum), which can decrease the air flow through your nose and sinuses and affect sinus drainage.  Functional abnormalities, such as when the small hairs (cilia) that line your sinuses and help remove mucus do not work properly or are not present. SIGNS AND SYMPTOMS  Symptoms of acute and chronic sinusitis are the same. The primary symptoms are pain and pressure around the affected sinuses. Other symptoms include:  Upper toothache.  Earache.  Headache.  Bad breath.  Decreased sense of smell and taste.  A cough, which worsens when you are lying flat.  Fatigue.  Fever.  Thick drainage from your nose, which often is green and may contain pus (purulent).  Swelling and warmth over the affected sinuses. DIAGNOSIS  Your health care provider will perform a physical exam. During the exam, your health care provider may:  Look in your nose for signs of abnormal growths in your nostrils (nasal polyps).  Tap over the affected sinus to check for signs of infection.  View the inside of your sinuses (endoscopy) using an imaging device that has a light attached (endoscope). If your health  care provider suspects that you have chronic sinusitis, one or more of the following tests may be recommended:  Allergy tests.  Nasal culture. A sample of mucus is taken from your nose, sent to a lab, and screened for bacteria.  Nasal cytology. A sample of mucus is taken from your nose and examined by your health care provider to determine if your sinusitis is related to an allergy. TREATMENT  Most cases of acute sinusitis are related to a viral infection and will resolve on their own within 10 days. Sometimes medicines are prescribed to help relieve symptoms (pain medicine, decongestants, nasal steroid sprays, or saline sprays).  However, for sinusitis related to a bacterial infection, your health care provider will prescribe antibiotic medicines. These are medicines that will help kill the bacteria causing the infection.  Rarely, sinusitis is caused by a fungal infection. In theses cases, your health care provider will prescribe antifungal medicine. For some cases of chronic sinusitis, surgery is needed. Generally, these are cases in which sinusitis recurs more than 3 times per year, despite other treatments. HOME CARE INSTRUCTIONS   Drink plenty of water. Water helps thin the mucus so your sinuses can drain more easily.  Use a humidifier.  Inhale steam 3 to 4 times a day (for example, sit in the bathroom with the shower running).  Apply a warm, moist washcloth to your face 3 to 4 times a day, or as directed by your health care provider.  Use saline nasal sprays to help moisten and clean your sinuses.  Take medicines only as directed by your health care provider.    If you were prescribed either an antibiotic or antifungal medicine, finish it all even if you start to feel better. SEEK IMMEDIATE MEDICAL CARE IF:  You have increasing pain or severe headaches.  You have nausea, vomiting, or drowsiness.  You have swelling around your face.  You have vision problems.  You have a stiff  neck.  You have difficulty breathing. MAKE SURE YOU:   Understand these instructions.  Will watch your condition.  Will get help right away if you are not doing well or get worse. Document Released: 09/20/2005 Document Revised: 02/04/2014 Document Reviewed: 10/05/2011 Exit[employer] Patient Information 2015 [employer]. This information is not intended to replace advice given to you by your health care provider. Make sure you discuss any questions you have with your health care provider.  - Take meds as prescribed - Use a cool mist humidifier  -Use saline nose sprays frequently -Saline irrigations of the nose can be very helpful if done frequently.  * 4X daily for 1 week*  * Use of a nettie pot can be helpful with this. Follow directions with this* -Force fluids -For any cough or congestion  Use plain Mucinex- regular strength or max strength is fine   * Children- consult with Pharmacist for dosing -For fever or aces or pains- take tylenol or ibuprofen appropriate for age and weight.  * for fevers greater than 101 orally you may alternate ibuprofen and tylenol every  3 hours. -Throat lozenges if help   Davisha Linthicum, FNP  

## 2015-01-19 ENCOUNTER — Other Ambulatory Visit: Payer: Self-pay | Admitting: Family

## 2015-01-19 ENCOUNTER — Other Ambulatory Visit: Payer: Self-pay | Admitting: Cardiovascular Disease

## 2015-01-20 NOTE — Telephone Encounter (Signed)
Rx has been sent to the pharmacy electronically. ° °

## 2015-01-28 ENCOUNTER — Encounter: Payer: Self-pay | Admitting: Nurse Practitioner

## 2015-01-28 ENCOUNTER — Ambulatory Visit (INDEPENDENT_AMBULATORY_CARE_PROVIDER_SITE_OTHER): Payer: Medicare Other | Admitting: Nurse Practitioner

## 2015-01-28 VITALS — BP 160/63 | HR 63 | Temp 98.1°F | Ht 63.0 in | Wt 176.0 lb

## 2015-01-28 DIAGNOSIS — N3000 Acute cystitis without hematuria: Secondary | ICD-10-CM

## 2015-01-28 DIAGNOSIS — R319 Hematuria, unspecified: Secondary | ICD-10-CM

## 2015-01-28 LAB — POCT URINALYSIS DIPSTICK
Bilirubin, UA: NEGATIVE
GLUCOSE UA: NEGATIVE
Ketones, UA: NEGATIVE
NITRITE UA: NEGATIVE
PROTEIN UA: NEGATIVE
Spec Grav, UA: 1.015
Urobilinogen, UA: NEGATIVE
pH, UA: 6

## 2015-01-28 LAB — POCT UA - MICROSCOPIC ONLY
Bacteria, U Microscopic: NEGATIVE
CASTS, UR, LPF, POC: NEGATIVE
Crystals, Ur, HPF, POC: NEGATIVE
Mucus, UA: NEGATIVE
RBC, urine, microscopic: NEGATIVE
Yeast, UA: NEGATIVE

## 2015-01-28 MED ORDER — CIPROFLOXACIN HCL 500 MG PO TABS: 500.0000 mg | ORAL_TABLET | Freq: Two times a day (BID) | ORAL | Status: DC

## 2015-01-28 NOTE — Progress Notes (Signed)
  Subjective:    Kristina Gibbs is a 69 y.o. female who complains of dysuria, foul smelling urine, nocturia and urgency. She has had symptoms for 4 days. Patient also complains of back pain and vaginal discharge. Patient denies fever. Patient does not have a history of recurrent UTI. Patient does not have a history of pyelonephritis.   The following portions of the patient's history were reviewed and updated as appropriate: allergies, current medications, past family history, past medical history, past social history, past surgical history and problem list.  Review of Systems Pertinent items are noted in HPI.    Objective:    BP 160/63 mmHg  Pulse 63  Temp(Src) 98.1 F (36.7 C) (Oral)  Ht 5\' 3"  (1.6 m)  Wt 176 lb (79.833 kg)  BMI 31.18 kg/m2 General appearance: alert and cooperative Lungs: clear to auscultation bilaterally Heart: regular rate and rhythm, S1, S2 normal, no murmur, click, rub or gallop Abdomen: soft, non-tender; bowel sounds normal; no masses,  no organomegaly and no CVA tenderness  Laboratory:    Results for orders placed or performed in visit on 01/28/15  POCT urinalysis dipstick  Result Value Ref Range   Color, UA gold    Clarity, UA clear    Glucose, UA neg    Bilirubin, UA neg    Ketones, UA neg    Spec Grav, UA 1.015    Blood, UA trace    pH, UA 6.0    Protein, UA neg    Urobilinogen, UA negative    Nitrite, UA neg    Leukocytes, UA large (3+)   POCT UA - Microscopic Only  Result Value Ref Range   WBC, Ur, HPF, POC 5-10    RBC, urine, microscopic neg    Bacteria, U Microscopic neg    Mucus, UA neg    Epithelial cells, urine per micros occ    Crystals, Ur, HPF, POC neg    Casts, Ur, LPF, POC neg    Yeast, UA neg     Assessment:    Acute cystitis     Plan:   Take medication as prescribe Cotton underwear Take shower not bath Cranberry juice, yogurt Force fluids AZO over the counter X2 days Culture pending RTO prn  Meds ordered  this encounter  Medications  . ciprofloxacin (CIPRO) 500 MG tablet    Sig: Take 1 tablet (500 mg total) by mouth 2 (two) times daily.    Dispense:  6 tablet    Refill:  0    Order Specific Question:  Supervising Provider    Answer:  Chipper Herb [[id number]]   Mary-Margaret Hassell Done, FNP

## 2015-01-28 NOTE — Patient Instructions (Signed)

## 2015-02-01 ENCOUNTER — Telehealth: Payer: Self-pay | Admitting: Nurse Practitioner

## 2015-02-03 MED ORDER — CIPROFLOXACIN HCL 500 MG PO TABS: 500.0000 mg | ORAL_TABLET | Freq: Two times a day (BID) | ORAL | Status: DC

## 2015-02-03 NOTE — Telephone Encounter (Signed)
Another round of cipro sent to [facility]

## 2015-02-03 NOTE — Telephone Encounter (Signed)
Patient aware that rx sent to pharmacy. 

## 2015-02-24 ENCOUNTER — Ambulatory Visit (INDEPENDENT_AMBULATORY_CARE_PROVIDER_SITE_OTHER): Payer: Medicare Other

## 2015-02-24 ENCOUNTER — Ambulatory Visit (INDEPENDENT_AMBULATORY_CARE_PROVIDER_SITE_OTHER): Payer: Medicare Other | Admitting: Family Medicine

## 2015-02-24 ENCOUNTER — Encounter: Payer: Self-pay | Admitting: Family Medicine

## 2015-02-24 VITALS — BP 134/52 | HR 59 | Temp 97.1°F | Ht 63.0 in | Wt 175.0 lb

## 2015-02-24 DIAGNOSIS — Z780 Asymptomatic menopausal state: Secondary | ICD-10-CM

## 2015-02-24 DIAGNOSIS — Z23 Encounter for immunization: Secondary | ICD-10-CM

## 2015-02-24 DIAGNOSIS — I10 Essential (primary) hypertension: Secondary | ICD-10-CM

## 2015-02-24 DIAGNOSIS — Z1231 Encounter for screening mammogram for malignant neoplasm of breast: Secondary | ICD-10-CM | POA: Diagnosis not present

## 2015-02-24 DIAGNOSIS — E785 Hyperlipidemia, unspecified: Secondary | ICD-10-CM

## 2015-02-24 NOTE — Patient Instructions (Signed)

## 2015-02-24 NOTE — Progress Notes (Signed)
   Subjective:    Patient ID: Kristina Gibbs, female    DOB: 10-May-1946, 69 y.o.   MRN: [id number]  HPI 69 year old female here to follow-up hypertension, coronary disease, and hyperlipidemia. She is followed by cardiologist in [place]. 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 She has no angina and is fairly active walking up to 3 miles per day. In reviewing her medicines I note that she is still on Ranexa and I wonder without pain and after the bypass if that could not be stopped.    Review of Systems  Constitutional: Negative.   HENT: Negative.   Respiratory: Negative.   Cardiovascular: Negative.   Gastrointestinal: Positive for constipation.  Neurological: Negative.   Psychiatric/Behavioral: Negative.        Objective:   Physical Exam  Constitutional: She is oriented to person, place, and time. She appears well-developed and well-nourished.  Cardiovascular: Normal rate and regular rhythm.   Pulmonary/Chest: Effort normal and breath sounds normal.  Abdominal: Soft.  Neurological: She is alert and oriented to person, place, and time.  Psychiatric: She has a normal mood and affect.          Assessment & Plan:  1. Essential hypertension Blood pressure is good. Continue with her losartan and Imdur  2. HLD (hyperlipidemia) I believe her lipids are managed by the cardiologist. She is requesting that we draw labs tomorrow in anticipation of cardiology visit  3. Visit for screening mammogram Apparently she had some cysts or lumps that are being followed. She has not had biopsies - MM Digital Diagnostic Bilat; Future  4. Post-menopausal She is on bisphosphonate as well as calcium and vitamin D  Wardell Honour MD - DG Bone Density; Future

## 2015-02-25 ENCOUNTER — Other Ambulatory Visit: Payer: Medicare Other

## 2015-02-26 LAB — CMP14+EGFR
A/G RATIO: 1.5 (ref 1.1–2.5)
ALT: 17 IU/L (ref 0–32)
AST: 22 IU/L (ref 0–40)
Albumin: 4.1 g/dL (ref 3.5–4.8)
Alkaline Phosphatase: 68 IU/L (ref 39–117)
BUN/Creatinine Ratio: 20 (ref 11–26)
BUN: 19 mg/dL (ref 8–27)
Bilirubin Total: 0.6 mg/dL (ref 0.0–1.2)
CALCIUM: 9.4 mg/dL (ref 8.7–10.3)
CHLORIDE: 98 mmol/L (ref 97–108)
CO2: 25 mmol/L (ref 18–29)
CREATININE: 0.95 mg/dL (ref 0.57–1.00)
GFR calc Af Amer: 70 mL/min/{1.73_m2} (ref >59)
GFR calc non Af Amer: 61 mL/min/{1.73_m2} (ref >59)
Globulin, Total: 2.7 g/dL (ref 1.5–4.5)
Glucose: 94 mg/dL (ref 65–99)
Potassium: 4.7 mmol/L (ref 3.5–5.2)
Sodium: 137 mmol/L (ref 134–144)
Total Protein: 6.8 g/dL (ref 6.0–8.5)

## 2015-02-26 LAB — LIPID PANEL
CHOLESTEROL TOTAL: 148 mg/dL (ref 100–199)
Chol/HDL Ratio: 2.1 ratio units (ref 0.0–4.4)
HDL: 69 mg/dL (ref >39)
LDL Calculated: 59 mg/dL (ref 0–99)
Triglycerides: 102 mg/dL (ref 0–149)
VLDL CHOLESTEROL CAL: 20 mg/dL (ref 5–40)

## 2015-03-04 ENCOUNTER — Ambulatory Visit: Payer: Medicare Other | Admitting: Family Medicine

## 2015-03-07 ENCOUNTER — Telehealth: Payer: Self-pay | Admitting: Cardiovascular Disease

## 2015-03-07 NOTE — Telephone Encounter (Signed)
Pt called in stating that the pharmacy switched her Crestor to the generic and she says that she can't take that because it raises her BP. She says Dr. Dan Maker put her on that medication years ago and he to switch her back due to the rise in BP. Please f/u with her  Thanks

## 2015-03-07 NOTE — Telephone Encounter (Signed)
Pt had recently filled rosuvastatin at pharmacy. Pt concerned that change to generic for crestor would raise her cholesterol - cites this as a concern in the past, she reports she was switched to generic and then back to name-brand only a couple years ago. Explained that generic for Crestor has only been made recently available - may have been a different cholesterol med she was switched to in the past. She voiced understanding. She reports will continue med, taking remaining brand pills and transition to generic, call if problems.

## 2015-03-12 ENCOUNTER — Ambulatory Visit (INDEPENDENT_AMBULATORY_CARE_PROVIDER_SITE_OTHER): Payer: Medicare Other | Admitting: Cardiovascular Disease

## 2015-03-12 ENCOUNTER — Encounter: Payer: Self-pay | Admitting: Cardiovascular Disease

## 2015-03-12 VITALS — BP 146/76 | HR 61 | Ht 63.0 in | Wt 174.4 lb

## 2015-03-12 DIAGNOSIS — I2510 Atherosclerotic heart disease of native coronary artery without angina pectoris: Secondary | ICD-10-CM

## 2015-03-12 DIAGNOSIS — I25700 Atherosclerosis of coronary artery bypass graft(s), unspecified, with unstable angina pectoris: Secondary | ICD-10-CM | POA: Diagnosis not present

## 2015-03-12 DIAGNOSIS — I10 Essential (primary) hypertension: Secondary | ICD-10-CM | POA: Diagnosis not present

## 2015-03-12 DIAGNOSIS — E785 Hyperlipidemia, unspecified: Secondary | ICD-10-CM | POA: Diagnosis not present

## 2015-03-12 MED ORDER — CRESTOR 20 MG PO TABS
20.0000 mg | ORAL_TABLET | Freq: Every day | ORAL | Status: DC
Start: 2015-03-12 — End: 2015-12-19

## 2015-03-12 NOTE — Progress Notes (Signed)
03/12/2015 Kristina Gibbs   1946-09-03  [id number]  Primary Physician Kristina Honour, MD Primary Cardiologist: Kristina Harp MD Kristina Gibbs   HPI: [name] is a 68 year old mildly overweight Caucasian female with a history of CAD previously taken care of by Dr. Rollene Gibbs. I last saw her in the office 03/12/14. She has a history of anterior wall myocardial infarction in 1995 with subsequent percutaneous revascularization to her RCA and LAD. She ultimately requiredmultivessel bypass surgery by Dr. Roxan Gibbs March of 2000 and LIMA to LAD, vein to a marginal branch of the circumflex and distal right coronary artery. Problems included hypertension and hyperlipidemia. I performed cardiac catheterization on her/2/10 revealing occluded vein grafts, patent LIMA and normal LV function.  Since I saw her last she did see Kristina Gibbs in the office complaining of chest pain with subsequent Myoview that was normal. She's been asymptomatic since.  Current Outpatient Prescriptions  Medication Sig Dispense Refill  . alendronate (FOSAMAX) 70 MG tablet TAKE 1 TABLET BY MOUTH ONCE A WEEK. TAKE WITH A FULL GLASS OF WATER ON AN EMPTY STOMACH. 4 tablet 5  . aspirin EC 81 MG tablet Take 81 mg by mouth daily.    Marland Kitchen b complex vitamins tablet Take 2 tablets by mouth daily.    . calcium carbonate (OS-CAL) 600 MG TABS Take 600 mg by mouth daily.    . cholecalciferol (VITAMIN D) 1000 UNITS tablet Take 1,000 Units by mouth daily.    . clopidogrel (PLAVIX) 75 MG tablet TAKE 1 TABLET BY MOUTH DAILY 30 tablet 11  . CO ENZYME Q-10 PO Take 1 tablet by mouth daily.    . CRESTOR 20 MG tablet TAKE 1 TABLET BY MOUTH EVERY DAY AS DIRECTED 30 tablet 11  . irbesartan (AVAPRO) 75 MG tablet Take 1 tablet (75 mg total) by mouth at bedtime. 30 tablet 11  . isosorbide mononitrate (IMDUR) 60 MG 24 hr tablet TAKE 1 TABLET BY MOUTH DAILY 30 tablet 10  . lubiprostone (AMITIZA) 24 MCG capsule Take 24 mcg by mouth at  bedtime.    . metoprolol succinate (TOPROL-XL) 25 MG 24 hr tablet TAKE 0.5 TABLETS (12.5 MG TOTAL) BY MOUTH DAILY. 30 tablet 6  . nitroGLYCERIN (NITROLINGUAL) 0.4 MG/SPRAY spray Place 1 spray under the tongue every 5 (five) minutes as needed for chest pain. 12 g 2  . omega-3 acid ethyl esters (LOVAZA) 1 G capsule TAKE ONE CAPSULE BY MOUTH 4 TIMES A DAY 120 capsule 4  . pantoprazole (PROTONIX) 40 MG tablet TAKE 1 TABLET (40 MG TOTAL) BY MOUTH DAILY. 30 tablet 10  . PATADAY 0.2 % SOLN Place 1 drop into both eyes daily as needed.  6  . ranolazine (RANEXA) 500 MG 12 hr tablet Take 1 tablet (500 mg total) by mouth 2 (two) times daily. 60 tablet 6  . triamterene-hydrochlorothiazide (DYAZIDE) 37.5-25 MG per capsule Take 1 each (1 capsule total) by mouth daily as needed. For fluid retention. 30 capsule 6  . vitamin C (ASCORBIC ACID) 500 MG tablet Take 1,000 mg by mouth daily.      No current facility-administered medications for this visit.    Allergies  Allergen Reactions  . Codeine Nausea And Vomiting  . Hydrocortisone Itching    History   Social History  . Marital Status: Married    Spouse Name: N/A  . Number of Children: N/A  . Years of Education: N/A   Occupational History  . Not on file.   Social  History Main Topics  . Smoking status: Former Smoker -- 0.50 packs/day    Types: Cigarettes  . Smokeless tobacco: Never Used  . Alcohol Use: 0.6 oz/week    1 Glasses of wine per week  . Drug Use: No  . Sexual Activity: Not Currently   Other Topics Concern  . Not on file   Social History Narrative     Review of Systems: General: negative for chills, fever, night sweats or weight changes.  Cardiovascular: negative for chest pain, dyspnea on exertion, edema, orthopnea, palpitations, paroxysmal nocturnal dyspnea or shortness of breath Dermatological: negative for rash Respiratory: negative for cough or wheezing Urologic: negative for hematuria Abdominal: negative for nausea,  vomiting, diarrhea, bright red blood per rectum, melena, or hematemesis Neurologic: negative for visual changes, syncope, or dizziness All other systems reviewed and are otherwise negative except as noted above.    Blood pressure 146/76, pulse 61, height 5\' 3"  (1.6 m), weight 174 lb 6.4 oz (79.107 kg).  General appearance: alert and no distress Neck: no adenopathy, no carotid bruit, no JVD, supple, symmetrical, trachea midline and thyroid not enlarged, symmetric, no tenderness/mass/nodules Lungs: clear to auscultation bilaterally Heart: regular rate and rhythm, S1, S2 normal, no murmur, click, rub or gallop Extremities: extremities normal, atraumatic, no cyanosis or edema  EKG normal sinus rhythm at 61 with septal Q waves. I personally reviewed this EKG  ASSESSMENT AND PLAN:   Hypertension History of hypertension blood pressure measured at 146/76. She is on Avapro, and metoprolol. Continue current meds at current dosing  HLD (hyperlipidemia) History of hyperlipidemia on Crestor 20 mg a day with recent lipid profile performed 02/25/15 revealing an LDL of 59 and a child 69  Coronary atherosclerosis History of coronary artery disease status post anterolateral myocardial infarction in 1995 with subsequent intervention of her RCA and LAD. She had bypass surgery by Dr. Roxan Gibbs  March 2000 with a LIMA to LAD vein to marginal branch the circumflex and distal right coronary artery. I performed cardiac catheterization on her in 2010 revealing an occluded graft with a patent LIMA and normal LV function. She's had a normal Myoview stress test and denies chest pain.      Kristina Harp MD FACP,FACC,FAHA, [facility] 03/12/2015 1:18 PM

## 2015-03-12 NOTE — Assessment & Plan Note (Signed)
History of hyperlipidemia on Crestor 20 mg a day with recent lipid profile performed 02/25/15 revealing an LDL of 59 and a child 70

## 2015-03-12 NOTE — Assessment & Plan Note (Signed)
History of coronary artery disease status post anterolateral myocardial infarction in 1995 with subsequent intervention of her RCA and LAD. She had bypass surgery by Dr. Roxan Hockey  March 2000 with a LIMA to LAD vein to marginal branch the circumflex and distal right coronary artery. I performed cardiac catheterization on her in 2010 revealing an occluded graft with a patent LIMA and normal LV function. She's had a normal Myoview stress test and denies chest pain.

## 2015-03-12 NOTE — Patient Instructions (Signed)
Dr Berry recommends that you schedule a follow-up appointment in 1 year. You will receive a reminder letter in the mail two months in advance. If you don't receive a letter, please call our office to schedule the follow-up appointment. 

## 2015-03-12 NOTE — Assessment & Plan Note (Signed)
History of hypertension blood pressure measured at 146/76. She is on Avapro, and metoprolol. Continue current meds at current dosing

## 2015-03-20 ENCOUNTER — Telehealth: Payer: Self-pay | Admitting: Family Medicine

## 2015-03-25 ENCOUNTER — Encounter: Payer: Self-pay | Admitting: *Deleted

## 2015-03-25 ENCOUNTER — Ambulatory Visit (INDEPENDENT_AMBULATORY_CARE_PROVIDER_SITE_OTHER): Payer: Medicare Other | Admitting: *Deleted

## 2015-03-25 ENCOUNTER — Other Ambulatory Visit: Payer: Self-pay | Admitting: Nurse Practitioner

## 2015-03-25 VITALS — BP 134/58 | HR 66 | Ht 62.5 in | Wt 176.0 lb

## 2015-03-25 DIAGNOSIS — R319 Hematuria, unspecified: Secondary | ICD-10-CM

## 2015-03-25 DIAGNOSIS — Z0000 Encounter for general adult medical examination without abnormal findings: Secondary | ICD-10-CM

## 2015-03-25 LAB — POCT URINALYSIS DIPSTICK
Bilirubin, UA: NEGATIVE
Blood, UA: NEGATIVE
Glucose, UA: NEGATIVE
KETONES UA: NEGATIVE
Nitrite, UA: NEGATIVE
PH UA: 5
Protein, UA: NEGATIVE
Spec Grav, UA: <=1.005
UROBILINOGEN UA: NEGATIVE

## 2015-03-25 LAB — POCT UA - MICROSCOPIC ONLY
BACTERIA, U MICROSCOPIC: NEGATIVE
Casts, Ur, LPF, POC: NEGATIVE
Crystals, Ur, HPF, POC: NEGATIVE
MUCUS UA: NEGATIVE
RBC, URINE, MICROSCOPIC: NEGATIVE
Yeast, UA: NEGATIVE

## 2015-03-25 MED ORDER — CIPROFLOXACIN HCL 500 MG PO TABS: 500.0000 mg | ORAL_TABLET | Freq: Two times a day (BID) | ORAL | Status: DC

## 2015-03-25 NOTE — Progress Notes (Signed)
Subjective:   Kristina Gibbs is a 69 y.o. female who presents for Medicare Annual (Subsequent) preventive examination.  Patient is doing well today, her only complaint is lower abdominal pain which she attributes to constipation as she did not take her amitiza because she was traveling for the past 3 days.  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  She is married and has 3 adult children.  She has moved to a new apartment in [place], [place] within the past 2 years, she enjoys exercising in the exercise room there and walking around the building.    Cardiac Risk Factors include: advanced age (>7men, >69 women);dyslipidemia;hypertension     Objective:     Vitals: BP 134/58 mmHg  Pulse 66  Ht 5' 2.5" (1.588 m)  Wt 176 lb (79.833 kg)  BMI 31.66 kg/m2  Tobacco History  Smoking status  . Former Smoker -- 0.50 packs/day  . Types: Cigarettes  . Quit date: 10/04/2009  Smokeless tobacco  . Never Used     Counseling given: No   Past Medical History  Diagnosis Date  . Hypercholesteremia   . Memory impairment      MMSE 27/30  . Cancer     ovarian  . Arthritis   . Allergy   . Osteopenia   . Coronary artery disease   . Hypertension   . Unstable angina 05/27/2014  . Stomach problems   . Heart murmur    Past Surgical History  Procedure Laterality Date  . Coronary artery bypass graft    . Abdominal hysterectomy    . Cholecystectomy    . Left heart catheterization with coronary angiogram N/A 05/27/2014    Procedure: LEFT HEART CATHETERIZATION WITH CORONARY ANGIOGRAM;  Surgeon: [name], MD;  Location: [facility] CATH LAB;  Service: Cardiovascular;  Laterality: N/A;  . Hysterectomy    . Eye surgery      bilateral cataract extraction  . Pcta      x7   . Hand surgery Left     injurred hand at  work- nerve repair   Family History  Problem Relation Age of Onset  . Stroke Mother   . Diabetes Mother   . Kidney disease Mother   . Hypertension Mother   . Hyperlipidemia Mother   . Heart disease Daughter     heart attack x2  . Heart disease Brother     2 heart attacks   History  Sexual Activity  . Sexual Activity: Not Currently    Outpatient Encounter Prescriptions as of 03/25/2015  Medication Sig  . alendronate (FOSAMAX) 70 MG tablet TAKE 1 TABLET BY MOUTH ONCE A WEEK. TAKE WITH A FULL GLASS OF WATER ON AN EMPTY STOMACH.  Marland Kitchen aspirin EC 81 MG tablet Take 81 mg by mouth daily.  Marland Kitchen b complex vitamins tablet Take 2 tablets by mouth daily.  . calcium carbonate (OS-CAL) 600 MG TABS Take 600 mg by mouth daily.  . cholecalciferol (VITAMIN D) 1000 UNITS tablet Take 1,000 Units by mouth daily.  . clopidogrel (PLAVIX) 75 MG tablet TAKE 1 TABLET BY MOUTH DAILY  . CO ENZYME Q-10 PO Take 1 tablet by mouth daily.  . CRESTOR 20 MG tablet Take 1 tablet (20 mg total) by mouth daily.  Marland Kitchen  irbesartan (AVAPRO) 75 MG tablet Take 1 tablet (75 mg total) by mouth at bedtime.  . isosorbide mononitrate (IMDUR) 60 MG 24 hr tablet TAKE 1 TABLET BY MOUTH DAILY  . lubiprostone (AMITIZA) 24 MCG capsule Take 24 mcg by mouth at bedtime.  . metoprolol succinate (TOPROL-XL) 25 MG 24 hr tablet TAKE 0.5 TABLETS (12.5 MG TOTAL) BY MOUTH DAILY.  . nitroGLYCERIN (NITROLINGUAL) 0.4 MG/SPRAY spray Place 1 spray under the tongue every 5 (five) minutes as needed for chest pain.  Marland Kitchen omega-3 acid ethyl esters (LOVAZA) 1 G capsule TAKE ONE CAPSULE BY MOUTH 4 TIMES A DAY  . pantoprazole (PROTONIX) 40 MG tablet TAKE 1 TABLET (40 MG TOTAL) BY MOUTH DAILY.  Marland Kitchen PATADAY 0.2 % SOLN Place 1 drop into both eyes daily as needed.  . ranolazine (RANEXA) 500 MG 12 hr tablet Take 1 tablet (500 mg total) by mouth 2 (two) times daily.  Marland Kitchen triamterene-hydrochlorothiazide (DYAZIDE) 37.5-25 MG per capsule Take 1 each (1 capsule total) by mouth  daily as needed. For fluid retention.  . vitamin C (ASCORBIC ACID) 500 MG tablet Take 1,000 mg by mouth daily.    No facility-administered encounter medications on file as of 03/25/2015.    Activities of Daily Living In your present state of health, do you have any difficulty performing the following activities: 03/25/2015 05/26/2014  Hearing? N -  Vision? N -  Difficulty concentrating or making decisions? Y -  Walking or climbing stairs? N -  Dressing or bathing? N -  Doing errands, shopping? N N  Preparing Food and eating ? N -  Using the Toilet? N -  In the past six months, have you accidently leaked urine? N -  Do you have problems with loss of bowel control? N -  Managing your Medications? N -  Managing your Finances? N -  Housekeeping or managing your Housekeeping? N -    Patient Care Team: Wardell Honour, MD as PCP - General (Family Medicine)    Assessment:    Exercise Activities and Dietary recommendations Current Exercise Habits:: Home exercise routine, Type of exercise: walking, Time (Minutes): 30, Frequency (Times/Week): 3, Weekly Exercise (Minutes/Week): 90, Intensity: Mild  Walks or rides stationary bike for 30 minutes 3 times per day Diet includes mostly lean meats, vegetables, and fruit- she avoids processed foods as much as possible  Goals    None    Continue current exercise habits at least 3 times per week    Fall Risk Fall Risk  03/25/2015 01/12/2014 11/12/2013  Falls in the past year? No Yes No  Number falls in past yr: - 1 -  Injury with Fall? - No -   Depression Screen PHQ 2/9 Scores 03/25/2015 11/12/2013  PHQ - 2 Score 0 0     Cognitive Testing MMSE - Mini Mental State Exam 03/25/2015  Orientation to time 4  Orientation to Place 5  Registration 3  Attention/ Calculation 5  Recall 3  Language- name 2 objects 2  Language- repeat 1  Language- follow 3 step command 3  Language- read & follow direction 1  Write a sentence 1  Copy design 1    Total score 29    Immunization History  Administered Date(s) Administered  . Influenza,inj,Quad PF,36+ Mos 07/30/2013, 07/02/2014  . Pneumococcal Conjugate-13 02/24/2015  . Pneumococcal Polysaccharide-23 03/23/2011   Screening Tests Health Maintenance  Topic Date Due  . INFLUENZA VACCINE  05/05/2015  . PNA vac Low Risk Adult (2 of 2 -  PPSV23) 02/24/2016  . MAMMOGRAM  04/29/2016  . DEXA SCAN  02/23/2017  . TETANUS/TDAP  10/05/2019  . COLONOSCOPY  09/30/2021  . ZOSTAVAX  Completed      Plan:      Continue current healthy habits with exercise and diet  Bring FOBT test back at next follow up visit  Continue regular follow up appointment with cardiologist     During the course of the visit the patient was educated and counseled about the following appropriate screening and preventive services:   Vaccines to include Pneumoccal, Influenza, Td, Zostavax- up to date  Electrocardiogram- up to date - done 03/12/15  Cardiovascular Disease - followed by Dr. Gwenlyn Found- was seen 03/12/15  Colorectal cancer screening- up to date FOBT given today   Bone density screening- up to date  Diabetes screening- up to date labs done 02/25/15  Glaucoma screening- up to date saw Dr. Hassell Done optometrist earlier this year  Mammography/PAP- mammogram up to date, patient has had a complete hysterectomy  Nutrition counseling - discussed today   Patient Instructions (the written plan) was given to the patient.   WYATT, AMY M, RN  03/25/2015     I have reviewed and agree with the above AWV documentation.  Claretta Fraise, M.D.

## 2015-03-25 NOTE — Patient Instructions (Signed)
Please complete stool cards and return them when you come back for your next visit   Thank you for coming in for your Annual Wellness Visit today!     Preventive Care for Adults A healthy lifestyle and preventive care can promote health and wellness. Preventive health guidelines for women include the following key practices.  A routine yearly physical is a good way to check with your health care provider about your health and preventive screening. It is a chance to share any concerns and updates on your health and to receive a thorough exam.  Visit your dentist for a routine exam and preventive care every 6 months. Brush your teeth twice a day and floss once a day. Good oral hygiene prevents tooth decay and gum disease.  The frequency of eye exams is based on your age, health, family medical history, use of contact lenses, and other factors. Follow your health care provider's recommendations for frequency of eye exams.  Eat a healthy diet. Foods like vegetables, fruits, whole grains, low-fat dairy products, and lean protein foods contain the nutrients you need without too many calories. Decrease your intake of foods high in solid fats, added sugars, and salt. Eat the right amount of calories for you.Get information about a proper diet from your health care provider, if necessary.  Regular physical exercise is one of the most important things you can do for your health. Most adults should get at least 150 minutes of moderate-intensity exercise (any activity that increases your heart rate and causes you to sweat) each week. In addition, most adults need muscle-strengthening exercises on 2 or more days a week.  Maintain a healthy weight. The body mass index (BMI) is a screening tool to identify possible weight problems. It provides an estimate of body fat based on height and weight. Your health care provider can find your BMI and can help you achieve or maintain a healthy weight.For adults 20  years and older:  A BMI below 18.5 is considered underweight.  A BMI of 18.5 to 24.9 is normal.  A BMI of 25 to 29.9 is considered overweight.  A BMI of 30 and above is considered obese.  Maintain normal blood lipids and cholesterol levels by exercising and minimizing your intake of saturated fat. Eat a balanced diet with plenty of fruit and vegetables. Blood tests for lipids and cholesterol should begin at age 3 and be repeated every 5 years. If your lipid or cholesterol levels are high, you are over 50, or you are at high risk for heart disease, you may need your cholesterol levels checked more frequently.Ongoing high lipid and cholesterol levels should be treated with medicines if diet and exercise are not working.  If you smoke, find out from your health care provider how to quit. If you do not use tobacco, do not start.  Lung cancer screening is recommended for adults aged 18-80 years who are at high risk for developing lung cancer because of a history of smoking. A yearly low-dose CT scan of the lungs is recommended for people who have at least a 30-pack-year history of smoking and are a current smoker or have quit within the past 15 years. A pack year of smoking is smoking an average of 1 pack of cigarettes a day for 1 year (for example: 1 pack a day for 30 years or 2 packs a day for 15 years). Yearly screening should continue until the smoker has stopped smoking for at least 15 years. Yearly screening  should be stopped for people who develop a health problem that would prevent them from having lung cancer treatment.  If you are pregnant, do not drink alcohol. If you are breastfeeding, be very cautious about drinking alcohol. If you are not pregnant and choose to drink alcohol, do not have more than 1 drink per day. One drink is considered to be 12 ounces (355 mL) of beer, 5 ounces (148 mL) of wine, or 1.5 ounces (44 mL) of liquor.  Avoid use of street drugs. Do not share needles with  anyone. Ask for help if you need support or instructions about stopping the use of drugs.  High blood pressure causes heart disease and increases the risk of stroke. Your blood pressure should be checked at least every 1 to 2 years. Ongoing high blood pressure should be treated with medicines if weight loss and exercise do not work.  If you are 20-11 years old, ask your health care provider if you should take aspirin to prevent strokes.  Diabetes screening involves taking a blood sample to check your fasting blood sugar level. This should be done once every 3 years, after age 72, if you are within normal weight and without risk factors for diabetes. Testing should be considered at a younger age or be carried out more frequently if you are overweight and have at least 1 risk factor for diabetes.  Breast cancer screening is essential preventive care for women. You should practice "breast self-awareness." This means understanding the normal appearance and feel of your breasts and may include breast self-examination. Any changes detected, no matter how small, should be reported to a health care provider. Women in their 31s and 30s should have a clinical breast exam (CBE) by a health care provider as part of a regular health exam every 1 to 3 years. After age 56, women should have a CBE every year. Starting at age 64, women should consider having a mammogram (breast X-ray test) every year. Women who have a family history of breast cancer should talk to their health care provider about genetic screening. Women at a high risk of breast cancer should talk to their health care providers about having an MRI and a mammogram every year.  Breast cancer gene (BRCA)-related cancer risk assessment is recommended for women who have family members with BRCA-related cancers. BRCA-related cancers include breast, ovarian, tubal, and peritoneal cancers. Having family members with these cancers may be associated with an  increased risk for harmful changes (mutations) in the breast cancer genes BRCA1 and BRCA2. Results of the assessment will determine the need for genetic counseling and BRCA1 and BRCA2 testing.  Routine pelvic exams to screen for cancer are no longer recommended for nonpregnant women who are considered low risk for cancer of the pelvic organs (ovaries, uterus, and vagina) and who do not have symptoms. Ask your health care provider if a screening pelvic exam is right for you.  If you have had past treatment for cervical cancer or a condition that could lead to cancer, you need Pap tests and screening for cancer for at least 20 years after your treatment. If Pap tests have been discontinued, your risk factors (such as having a new sexual partner) need to be reassessed to determine if screening should be resumed. Some women have medical problems that increase the chance of getting cervical cancer. In these cases, your health care provider may recommend more frequent screening and Pap tests.  The HPV test is an additional test that  may be used for cervical cancer screening. The HPV test looks for the virus that can cause the cell changes on the cervix. The cells collected during the Pap test can be tested for HPV. The HPV test could be used to screen women aged 59 years and older, and should be used in women of any age who have unclear Pap test results. After the age of 67, women should have HPV testing at the same frequency as a Pap test.  Colorectal cancer can be detected and often prevented. Most routine colorectal cancer screening begins at the age of 54 years and continues through age 37 years. However, your health care provider may recommend screening at an earlier age if you have risk factors for colon cancer. On a yearly basis, your health care provider may provide home test kits to check for hidden blood in the stool. Use of a small camera at the end of a tube, to directly examine the colon  (sigmoidoscopy or colonoscopy), can detect the earliest forms of colorectal cancer. Talk to your health care provider about this at age 96, when routine screening begins. Direct exam of the colon should be repeated every 5-10 years through age 15 years, unless early forms of pre-cancerous polyps or small growths are found.  People who are at an increased risk for hepatitis B should be screened for this virus. You are considered at high risk for hepatitis B if:  You were born in a country where hepatitis B occurs often. Talk with your health care provider about which countries are considered high risk.  Your parents were born in a high-risk country and you have not received a shot to protect against hepatitis B (hepatitis B vaccine).  You have HIV or AIDS.  You use needles to inject street drugs.  You live with, or have sex with, someone who has hepatitis B.  You get hemodialysis treatment.  You take certain medicines for conditions like cancer, organ transplantation, and autoimmune conditions.  Hepatitis C blood testing is recommended for all people born from [birth year] through 1965 and any individual with known risks for hepatitis C.  Practice safe sex. Use condoms and avoid high-risk sexual practices to reduce the spread of sexually transmitted infections (STIs). STIs include gonorrhea, chlamydia, syphilis, trichomonas, herpes, HPV, and human immunodeficiency virus (HIV). Herpes, HIV, and HPV are viral illnesses that have no cure. They can result in disability, cancer, and death.  You should be screened for sexually transmitted illnesses (STIs) including gonorrhea and chlamydia if:  You are sexually active and are younger than 24 years.  You are older than 24 years and your health care provider tells you that you are at risk for this type of infection.  Your sexual activity has changed since you were last screened and you are at an increased risk for chlamydia or gonorrhea. Ask your health  care provider if you are at risk.  If you are at risk of being infected with HIV, it is recommended that you take a prescription medicine daily to prevent HIV infection. This is called preexposure prophylaxis (PrEP). You are considered at risk if:  You are a heterosexual woman, are sexually active, and are at increased risk for HIV infection.  You take drugs by injection.  You are sexually active with a partner who has HIV.  Talk with your health care provider about whether you are at high risk of being infected with HIV. If you choose to begin PrEP, you should first be  tested for HIV. You should then be tested every 3 months for as long as you are taking PrEP.  Osteoporosis is a disease in which the bones lose minerals and strength with aging. This can result in serious bone fractures or breaks. The risk of osteoporosis can be identified using a bone density scan. Women ages 64 years and over and women at risk for fractures or osteoporosis should discuss screening with their health care providers. Ask your health care provider whether you should take a calcium supplement or vitamin D to reduce the rate of osteoporosis.  Menopause can be associated with physical symptoms and risks. Hormone replacement therapy is available to decrease symptoms and risks. You should talk to your health care provider about whether hormone replacement therapy is right for you.  Use sunscreen. Apply sunscreen liberally and repeatedly throughout the day. You should seek shade when your shadow is shorter than you. Protect yourself by wearing long sleeves, pants, a wide-brimmed hat, and sunglasses year round, whenever you are outdoors.  Once a month, do a whole body skin exam, using a mirror to look at the skin on your back. Tell your health care provider of new moles, moles that have irregular borders, moles that are larger than a pencil eraser, or moles that have changed in shape or color.  Stay current with required  vaccines (immunizations).  Influenza vaccine. All adults should be immunized every year.  Tetanus, diphtheria, and acellular pertussis (Td, Tdap) vaccine. Pregnant women should receive 1 dose of Tdap vaccine during each pregnancy. The dose should be obtained regardless of the length of time since the last dose. Immunization is preferred during the 27th-36th week of gestation. An adult who has not previously received Tdap or who does not know her vaccine status should receive 1 dose of Tdap. This initial dose should be followed by tetanus and diphtheria toxoids (Td) booster doses every 10 years. Adults with an unknown or incomplete history of completing a 3-dose immunization series with Td-containing vaccines should begin or complete a primary immunization series including a Tdap dose. Adults should receive a Td booster every 10 years.  Varicella vaccine. An adult without evidence of immunity to varicella should receive 2 doses or a second dose if she has previously received 1 dose. Pregnant females who do not have evidence of immunity should receive the first dose after pregnancy. This first dose should be obtained before leaving the health care facility. The second dose should be obtained 4-8 weeks after the first dose.  Human papillomavirus (HPV) vaccine. Females aged 13-26 years who have not received the vaccine previously should obtain the 3-dose series. The vaccine is not recommended for use in pregnant females. However, pregnancy testing is not needed before receiving a dose. If a female is found to be pregnant after receiving a dose, no treatment is needed. In that case, the remaining doses should be delayed until after the pregnancy. Immunization is recommended for any person with an immunocompromised condition through the age of 45 years if she did not get any or all doses earlier. During the 3-dose series, the second dose should be obtained 4-8 weeks after the first dose. The third dose should be  obtained 24 weeks after the first dose and 16 weeks after the second dose.  Zoster vaccine. One dose is recommended for adults aged 68 years or older unless certain conditions are present.  Measles, mumps, and rubella (MMR) vaccine. Adults born before [birth year] generally are considered immune to measles and  mumps. Adults born in [birth year] or later should have 1 or more doses of MMR vaccine unless there is a contraindication to the vaccine or there is laboratory evidence of immunity to each of the three diseases. A routine second dose of MMR vaccine should be obtained at least 28 days after the first dose for students attending postsecondary schools, health care workers, or international travelers. People who received inactivated measles vaccine or an unknown type of measles vaccine during 1963-1967 should receive 2 doses of MMR vaccine. People who received inactivated mumps vaccine or an unknown type of mumps vaccine before 1979 and are at high risk for mumps infection should consider immunization with 2 doses of MMR vaccine. For females of childbearing age, rubella immunity should be determined. If there is no evidence of immunity, females who are not pregnant should be vaccinated. If there is no evidence of immunity, females who are pregnant should delay immunization until after pregnancy. Unvaccinated health care workers born before [birth year] who lack laboratory evidence of measles, mumps, or rubella immunity or laboratory confirmation of disease should consider measles and mumps immunization with 2 doses of MMR vaccine or rubella immunization with 1 dose of MMR vaccine.  Pneumococcal 13-valent conjugate (PCV13) vaccine. When indicated, a person who is uncertain of her immunization history and has no record of immunization should receive the PCV13 vaccine. An adult aged 36 years or older who has certain medical conditions and has not been previously immunized should receive 1 dose of PCV13 vaccine. This PCV13 should be  followed with a dose of pneumococcal polysaccharide (PPSV23) vaccine. The PPSV23 vaccine dose should be obtained at least 8 weeks after the dose of PCV13 vaccine. An adult aged 7 years or older who has certain medical conditions and previously received 1 or more doses of PPSV23 vaccine should receive 1 dose of PCV13. The PCV13 vaccine dose should be obtained 1 or more years after the last PPSV23 vaccine dose.  Pneumococcal polysaccharide (PPSV23) vaccine. When PCV13 is also indicated, PCV13 should be obtained first. All adults aged 28 years and older should be immunized. An adult younger than age 19 years who has certain medical conditions should be immunized. Any person who resides in a nursing home or long-term care facility should be immunized. An adult smoker should be immunized. People with an immunocompromised condition and certain other conditions should receive both PCV13 and PPSV23 vaccines. People with human immunodeficiency virus (HIV) infection should be immunized as soon as possible after diagnosis. Immunization during chemotherapy or radiation therapy should be avoided. Routine use of PPSV23 vaccine is not recommended for American Indians, [place] Natives, or people younger than 65 years unless there are medical conditions that require PPSV23 vaccine. When indicated, people who have unknown immunization and have no record of immunization should receive PPSV23 vaccine. One-time revaccination 5 years after the first dose of PPSV23 is recommended for people aged 19-64 years who have chronic kidney failure, nephrotic syndrome, asplenia, or immunocompromised conditions. People who received 1-2 doses of PPSV23 before age 66 years should receive another dose of PPSV23 vaccine at age 47 years or later if at least 5 years have passed since the previous dose. Doses of PPSV23 are not needed for people immunized with PPSV23 at or after age 39 years.  Meningococcal vaccine. Adults with asplenia or persistent  complement component deficiencies should receive 2 doses of quadrivalent meningococcal conjugate (MenACWY-D) vaccine. The doses should be obtained at least 2 months apart. Microbiologists working with certain meningococcal bacteria, [employer]  recruits, people at risk during an outbreak, and people who travel to or live in countries with a high rate of meningitis should be immunized. A first-year college student up through age 3 years who is living in a residence hall should receive a dose if she did not receive a dose on or after her 16th birthday. Adults who have certain high-risk conditions should receive one or more doses of vaccine.  Hepatitis A vaccine. Adults who wish to be protected from this disease, have certain high-risk conditions, work with hepatitis A-infected animals, work in hepatitis A research labs, or travel to or work in countries with a high rate of hepatitis A should be immunized. Adults who were previously unvaccinated and who anticipate close contact with an international adoptee during the first 60 days after arrival in the [place] from a country with a high rate of hepatitis A should be immunized.  Hepatitis B vaccine. Adults who wish to be protected from this disease, have certain high-risk conditions, may be exposed to blood or other infectious body fluids, are household contacts or sex partners of hepatitis B positive people, are clients or workers in certain care facilities, or travel to or work in countries with a high rate of hepatitis B should be immunized.  Haemophilus influenzae type b (Hib) vaccine. A previously unvaccinated person with asplenia or sickle cell disease or having a scheduled splenectomy should receive 1 dose of Hib vaccine. Regardless of previous immunization, a recipient of a hematopoietic stem cell transplant should receive a 3-dose series 6-12 months after her successful transplant. Hib vaccine is not recommended for adults with HIV  infection. Preventive Services / Frequency Ages 93 to 67 years  Blood pressure check.** / Every 1 to 2 years.  Lipid and cholesterol check.** / Every 5 years beginning at age 50.  Clinical breast exam.** / Every 3 years for women in their 83s and 89s.  BRCA-related cancer risk assessment.** / For women who have family members with a BRCA-related cancer (breast, ovarian, tubal, or peritoneal cancers).  Pap test.** / Every 2 years from ages 64 through 35. Every 3 years starting at age 61 through age 96 or 73 with a history of 3 consecutive normal Pap tests.  HPV screening.** / Every 3 years from ages 94 through ages 65 to 82 with a history of 3 consecutive normal Pap tests.  Hepatitis C blood test.** / For any individual with known risks for hepatitis C.  Skin self-exam. / Monthly.  Influenza vaccine. / Every year.  Tetanus, diphtheria, and acellular pertussis (Tdap, Td) vaccine.** / Consult your health care provider. Pregnant women should receive 1 dose of Tdap vaccine during each pregnancy. 1 dose of Td every 10 years.  Varicella vaccine.** / Consult your health care provider. Pregnant females who do not have evidence of immunity should receive the first dose after pregnancy.  HPV vaccine. / 3 doses over 6 months, if 43 and younger. The vaccine is not recommended for use in pregnant females. However, pregnancy testing is not needed before receiving a dose.  Measles, mumps, rubella (MMR) vaccine.** / You need at least 1 dose of MMR if you were born in 1957 or later. You may also need a 2nd dose. For females of childbearing age, rubella immunity should be determined. If there is no evidence of immunity, females who are not pregnant should be vaccinated. If there is no evidence of immunity, females who are pregnant should delay immunization until after pregnancy.  Pneumococcal 13-valent  conjugate (PCV13) vaccine.** / Consult your health care provider.  Pneumococcal polysaccharide  (PPSV23) vaccine.** / 1 to 2 doses if you smoke cigarettes or if you have certain conditions.  Meningococcal vaccine.** / 1 dose if you are age 35 to 59 years and a Market researcher living in a residence hall, or have one of several medical conditions, you need to get vaccinated against meningococcal disease. You may also need additional booster doses.  Hepatitis A vaccine.** / Consult your health care provider.  Hepatitis B vaccine.** / Consult your health care provider.  Haemophilus influenzae type b (Hib) vaccine.** / Consult your health care provider. Ages 3 to 26 years  Blood pressure check.** / Every 1 to 2 years.  Lipid and cholesterol check.** / Every 5 years beginning at age 8 years.  Lung cancer screening. / Every year if you are aged 110-80 years and have a 30-pack-year history of smoking and currently smoke or have quit within the past 15 years. Yearly screening is stopped once you have quit smoking for at least 15 years or develop a health problem that would prevent you from having lung cancer treatment.  Clinical breast exam.** / Every year after age 26 years.  BRCA-related cancer risk assessment.** / For women who have family members with a BRCA-related cancer (breast, ovarian, tubal, or peritoneal cancers).  Mammogram.** / Every year beginning at age 5 years and continuing for as long as you are in good health. Consult with your health care provider.  Pap test.** / Every 3 years starting at age 20 years through age 90 or 76 years with a history of 3 consecutive normal Pap tests.  HPV screening.** / Every 3 years from ages 31 years through ages 11 to 40 years with a history of 3 consecutive normal Pap tests.  Fecal occult blood test (FOBT) of stool. / Every year beginning at age 30 years and continuing until age 2 years. You may not need to do this test if you get a colonoscopy every 10 years.  Flexible sigmoidoscopy or colonoscopy.** / Every 5 years for a  flexible sigmoidoscopy or every 10 years for a colonoscopy beginning at age 74 years and continuing until age 32 years.  Hepatitis C blood test.** / For all people born from [birth year] through 1967 and any individual with known risks for hepatitis C.  Skin self-exam. / Monthly.  Influenza vaccine. / Every year.  Tetanus, diphtheria, and acellular pertussis (Tdap/Td) vaccine.** / Consult your health care provider. Pregnant women should receive 1 dose of Tdap vaccine during each pregnancy. 1 dose of Td every 10 years.  Varicella vaccine.** / Consult your health care provider. Pregnant females who do not have evidence of immunity should receive the first dose after pregnancy.  Zoster vaccine.** / 1 dose for adults aged 104 years or older.  Measles, mumps, rubella (MMR) vaccine.** / You need at least 1 dose of MMR if you were born in 1959 or later. You may also need a 2nd dose. For females of childbearing age, rubella immunity should be determined. If there is no evidence of immunity, females who are not pregnant should be vaccinated. If there is no evidence of immunity, females who are pregnant should delay immunization until after pregnancy.  Pneumococcal 13-valent conjugate (PCV13) vaccine.** / Consult your health care provider.  Pneumococcal polysaccharide (PPSV23) vaccine.** / 1 to 2 doses if you smoke cigarettes or if you have certain conditions.  Meningococcal vaccine.** / Consult your health care provider.  Hepatitis  A vaccine.** / Consult your health care provider.  Hepatitis B vaccine.** / Consult your health care provider.  Haemophilus influenzae type b (Hib) vaccine.** / Consult your health care provider. Ages 37 years and over  Blood pressure check.** / Every 1 to 2 years.  Lipid and cholesterol check.** / Every 5 years beginning at age 43 years.  Lung cancer screening. / Every year if you are aged 27-80 years and have a 30-pack-year history of smoking and currently smoke or have  quit within the past 15 years. Yearly screening is stopped once you have quit smoking for at least 15 years or develop a health problem that would prevent you from having lung cancer treatment.  Clinical breast exam.** / Every year after age 93 years.  BRCA-related cancer risk assessment.** / For women who have family members with a BRCA-related cancer (breast, ovarian, tubal, or peritoneal cancers).  Mammogram.** / Every year beginning at age 34 years and continuing for as long as you are in good health. Consult with your health care provider.  Pap test.** / Every 3 years starting at age 77 years through age 27 or 27 years with 3 consecutive normal Pap tests. Testing can be stopped between 65 and 70 years with 3 consecutive normal Pap tests and no abnormal Pap or HPV tests in the past 10 years.  HPV screening.** / Every 3 years from ages 84 years through ages 52 or 23 years with a history of 3 consecutive normal Pap tests. Testing can be stopped between 65 and 70 years with 3 consecutive normal Pap tests and no abnormal Pap or HPV tests in the past 10 years.  Fecal occult blood test (FOBT) of stool. / Every year beginning at age 69 years and continuing until age 8 years. You may not need to do this test if you get a colonoscopy every 10 years.  Flexible sigmoidoscopy or colonoscopy.** / Every 5 years for a flexible sigmoidoscopy or every 10 years for a colonoscopy beginning at age 50 years and continuing until age 38 years.  Hepatitis C blood test.** / For all people born from [birth year] through 1967 and any individual with known risks for hepatitis C.  Osteoporosis screening.** / A one-time screening for women ages 17 years and over and women at risk for fractures or osteoporosis.  Skin self-exam. / Monthly.  Influenza vaccine. / Every year.  Tetanus, diphtheria, and acellular pertussis (Tdap/Td) vaccine.** / 1 dose of Td every 10 years.  Varicella vaccine.** / Consult your health care  provider.  Zoster vaccine.** / 1 dose for adults aged 52 years or older.  Pneumococcal 13-valent conjugate (PCV13) vaccine.** / Consult your health care provider.  Pneumococcal polysaccharide (PPSV23) vaccine.** / 1 dose for all adults aged 68 years and older.  Meningococcal vaccine.** / Consult your health care provider.  Hepatitis A vaccine.** / Consult your health care provider.  Hepatitis B vaccine.** / Consult your health care provider.  Haemophilus influenzae type b (Hib) vaccine.** / Consult your health care provider. ** Family history and personal history of risk and conditions may change your health care provider's recommendations. Document Released: 11/16/2001 Document Revised: 02/04/2014 Document Reviewed: 02/15/2011 [facility] Patient Information 2015 [place], [place]. This information is not intended to replace advice given to you by your health care provider. Make sure you discuss any questions you have with your health care provider.

## 2015-03-26 ENCOUNTER — Other Ambulatory Visit: Payer: Self-pay | Admitting: Family Medicine

## 2015-03-26 DIAGNOSIS — Z1231 Encounter for screening mammogram for malignant neoplasm of breast: Secondary | ICD-10-CM

## 2015-03-27 LAB — URINE CULTURE

## 2015-04-01 ENCOUNTER — Other Ambulatory Visit: Payer: Medicare Other

## 2015-04-01 DIAGNOSIS — Z1212 Encounter for screening for malignant neoplasm of rectum: Secondary | ICD-10-CM

## 2015-04-01 NOTE — Progress Notes (Signed)
Lab only 

## 2015-04-03 LAB — FECAL OCCULT BLOOD, IMMUNOCHEMICAL: Fecal Occult Bld: NEGATIVE

## 2015-04-17 ENCOUNTER — Encounter: Payer: Self-pay | Admitting: Cardiovascular Disease

## 2015-04-20 ENCOUNTER — Other Ambulatory Visit: Payer: Self-pay | Admitting: Family Medicine

## 2015-04-21 ENCOUNTER — Other Ambulatory Visit: Payer: Self-pay | Admitting: Family Medicine

## 2015-05-01 ENCOUNTER — Ambulatory Visit
Admission: RE | Admit: 2015-05-01 | Discharge: 2015-05-01 | Disposition: A | Discharge status: Home / Self Care | Payer: Medicare Other | Source: Ambulatory Visit | Attending: Family Medicine | Admitting: Family Medicine

## 2015-05-01 DIAGNOSIS — Z1231 Encounter for screening mammogram for malignant neoplasm of breast: Secondary | ICD-10-CM

## 2015-05-08 ENCOUNTER — Other Ambulatory Visit: Payer: Self-pay

## 2015-05-08 MED ORDER — ALENDRONATE SODIUM 70 MG PO TABS: ORAL_TABLET | ORAL | Status: DC

## 2015-07-07 ENCOUNTER — Encounter: Payer: Self-pay | Admitting: Family Medicine

## 2015-07-07 ENCOUNTER — Ambulatory Visit (INDEPENDENT_AMBULATORY_CARE_PROVIDER_SITE_OTHER): Payer: Medicare Other | Admitting: Family Medicine

## 2015-07-07 VITALS — BP 122/66 | HR 58 | Temp 97.4°F | Ht 62.5 in | Wt 174.4 lb

## 2015-07-07 DIAGNOSIS — J302 Other seasonal allergic rhinitis: Secondary | ICD-10-CM

## 2015-07-07 DIAGNOSIS — I200 Unstable angina: Secondary | ICD-10-CM

## 2015-07-07 DIAGNOSIS — I10 Essential (primary) hypertension: Secondary | ICD-10-CM

## 2015-07-07 DIAGNOSIS — Z23 Encounter for immunization: Secondary | ICD-10-CM

## 2015-07-07 NOTE — Progress Notes (Signed)
HPI  Patient presents today here for blood work and to discuss several concerns.  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 She denies chest pain She's compliant with all of her medications.  Hypertension No chest pain, dyspnea, palpitations, leg edema  Implant with medications, she is on Avapro.  Right hand pain She explains that in August she had a fall or she laid on her right shoulder She had severe bruising her right shoulder and right hand, she was seen at [facility] had negative x-rays at that time. Her pain was steadily improving until about one week ago and she had a sharp sudden pain in her right palm whenever she was picking up laundry from the laundry machine. She explains that she's developed a small nodule in the middle of her palm but that her pain has continued to improve since this time. She feels her strength is reduced overall because of pain but she has maintained good function of the hand. She has pain with lifting heavy objects. She developed a small bruise on the right middle finger which is resolving.  Left rib pain She's a she's had left upper quadrant/left rib pain described as sharp intermittent pain intermittently over the last year. it comes on seemingly without any aggravating factors and then goes away after 15-30 minutes. It is not exertional Within it is present it is aggravated by deep inspiration It happens approximately once per month, when it comes on it seems to come and go for the next several days and then resolved without any explanation.  Allergies She's previously well controlled with Claritin, she's concerned that this is not helping anymore. She has tried Zyrtec without improvement previously She states that she seems to get sinus infection in the fall or spring and is concerned she may get another one. No current symptoms  Memory loss She is concerned that her short-term  memory is poor, she recalls that she's been tested several times for dementia She attends church and Bible studies.has occasional word finding difficulty This has been stable for about 1 year.   PMH: Smoking status noted ROS: Per HPI  Objective: BP 122/66 mmHg  Pulse 58  Temp(Src) 97.4 F (36.3 C) (Oral)  Ht 5' 2.5" (1.588 m)  Wt 174 lb 6.4 oz (79.107 kg)  BMI 31.37 kg/m2 Gen: NAD, alert, cooperative with exam HEENT: NCAT CV: RRR, good S1/S2, no murmur Resp: CTABL, no wheezes, non-labored Abd: SNTND, BS present, no guarding or organomegaly, slight soreness along L lower rib border Ext: No edema, warm Neuro: Alert and oriented, No gross deficits MSK: Resolving bruise on her right lateral edge of her middle finger between her finger with and proximal interphalangeal joint resolving with a slight green hue Small approximately 1/4 inch in diameter subcutaneous nodule over her third digit flexor tendon surface in her mid palm- no tenderness to palpation  Assessment and plan:  # CAD Labs, compliant with meds Continue beta blocker, nitrate, ARB, Ranexa Following up with cardiology annually  # Hypertension Well-controlled Labs Continue Avapro and Maxzide  # Right hand pain Possibly developing Dupuytren's contracture, however with her recent fall and the acute onset of her pain with bruising I believe this is a resolving acute injury Expect improvement with avoiding painful stimuli, continue use, and watchful waiting Previously evaluated for bony abnormality in the ER  # Allergies Change Claritin to Allegra  # Memory impairment Previous MMSE's with a score of 27  out of 30 which is stable for 3 years now Plan to screen with MMSE annually  does not meet criteria for cognitive impairment or dementia    Orders Placed This Encounter  Procedures  . CMP14+EGFR  . CBC with Differential  . Lipid Panel    Laroy Apple, MD [facility] 07/07/2015, 12:20  PM

## 2015-07-07 NOTE — Patient Instructions (Signed)
Great to meet you!  Please fill out our survey!  Change claritin to allegra (fexofenadine)  Your mental status exam looks good at 53, we can do this each summer.   I expect your hand to get gradually better over the next 3-4 weeks, avoid painful activities but be sure to still use it.   We will get back with you about your labs within a week.   You may try 1 cap of miralax once daily as needed, as you know you should be careful with daily use and you may need to cut it back to 1/2 cap either as needed or just 2-3 times per week.

## 2015-07-08 LAB — CBC WITH DIFFERENTIAL/PLATELET
BASOS ABS: 0.1 10*3/uL (ref 0.0–0.2)
BASOS: 1 %
EOS (ABSOLUTE): 0.2 10*3/uL (ref 0.0–0.4)
Eos: 5 %
Hematocrit: 40.4 % (ref 34.0–46.6)
Hemoglobin: 13.5 g/dL (ref 11.1–15.9)
IMMATURE GRANS (ABS): 0 10*3/uL (ref 0.0–0.1)
IMMATURE GRANULOCYTES: 0 %
LYMPHS: 26 %
Lymphocytes Absolute: 1.1 10*3/uL (ref 0.7–3.1)
MCH: 29.8 pg (ref 26.6–33.0)
MCHC: 33.4 g/dL (ref 31.5–35.7)
MCV: 89 fL (ref 79–97)
MONOS ABS: 0.4 10*3/uL (ref 0.1–0.9)
Monocytes: 9 %
NEUTROS PCT: 59 %
Neutrophils Absolute: 2.5 10*3/uL (ref 1.4–7.0)
PLATELETS: 199 10*3/uL (ref 150–379)
RBC: 4.53 x10E6/uL (ref 3.77–5.28)
RDW: 14 % (ref 12.3–15.4)
WBC: 4.2 10*3/uL (ref 3.4–10.8)

## 2015-07-08 LAB — CMP14+EGFR
A/G RATIO: 1.8 (ref 1.1–2.5)
ALK PHOS: 56 IU/L (ref 39–117)
ALT: 24 IU/L (ref 0–32)
AST: 25 IU/L (ref 0–40)
Albumin: 4.4 g/dL (ref 3.5–4.8)
BILIRUBIN TOTAL: 0.5 mg/dL (ref 0.0–1.2)
BUN/Creatinine Ratio: 11 (ref 11–26)
BUN: 13 mg/dL (ref 8–27)
CALCIUM: 9.3 mg/dL (ref 8.7–10.3)
CHLORIDE: 99 mmol/L (ref 97–108)
CO2: 26 mmol/L (ref 18–29)
Creatinine, Ser: 1.18 mg/dL — ABNORMAL HIGH (ref 0.57–1.00)
GFR calc Af Amer: 54 mL/min/{1.73_m2} — ABNORMAL LOW (ref >59)
GFR calc non Af Amer: 47 mL/min/{1.73_m2} — ABNORMAL LOW (ref >59)
GLOBULIN, TOTAL: 2.5 g/dL (ref 1.5–4.5)
Glucose: 99 mg/dL (ref 65–99)
POTASSIUM: 4.8 mmol/L (ref 3.5–5.2)
SODIUM: 137 mmol/L (ref 134–144)
Total Protein: 6.9 g/dL (ref 6.0–8.5)

## 2015-07-08 LAB — LIPID PANEL
CHOLESTEROL TOTAL: 171 mg/dL (ref 100–199)
Chol/HDL Ratio: 2.4 ratio units (ref 0.0–4.4)
HDL: 70 mg/dL (ref >39)
LDL Calculated: 81 mg/dL (ref 0–99)
Triglycerides: 102 mg/dL (ref 0–149)
VLDL CHOLESTEROL CAL: 20 mg/dL (ref 5–40)

## 2015-07-10 ENCOUNTER — Telehealth: Payer: Self-pay | Admitting: Family Medicine

## 2015-07-10 NOTE — Telephone Encounter (Signed)
Pt aware that she can still take her allegra per Dr.Bradshaw.

## 2015-07-10 NOTE — Telephone Encounter (Signed)
Discussed with [name], She has called and expalined ok to take allegra with CKD stage 3..   Laroy Apple, MD [facility] 07/10/2015, 3:16 PM

## 2015-07-24 ENCOUNTER — Other Ambulatory Visit: Payer: Self-pay | Admitting: *Deleted

## 2015-07-24 MED ORDER — ALENDRONATE SODIUM 70 MG PO TABS: ORAL_TABLET | ORAL | Status: DC

## 2015-08-04 ENCOUNTER — Ambulatory Visit (INDEPENDENT_AMBULATORY_CARE_PROVIDER_SITE_OTHER): Payer: Medicare Other | Admitting: *Deleted

## 2015-08-04 ENCOUNTER — Telehealth: Payer: Self-pay | Admitting: Family Medicine

## 2015-08-04 VITALS — BP 140/64 | HR 56 | Ht 62.5 in | Wt 173.7 lb

## 2015-08-04 DIAGNOSIS — I10 Essential (primary) hypertension: Secondary | ICD-10-CM

## 2015-08-04 DIAGNOSIS — L989 Disorder of the skin and subcutaneous tissue, unspecified: Secondary | ICD-10-CM | POA: Insufficient documentation

## 2015-08-04 NOTE — Telephone Encounter (Signed)
Referral written  Laroy Apple, MD [facility] 08/04/2015, 5:19 PM

## 2015-08-04 NOTE — Progress Notes (Signed)
Patient walked in with complaints of elevated BP at dentist office this AM. She reports her BP was 145/74 and re-check 5 minutes later was 145/61. She states she has had some "high" readings at home. She has had some headaches. She took NTGx1 the other night for some chest discomfort - she had been working around the house a lot and had not rested well. She has no lightheadedness/dizziness. Her BP was 140/64 and HR 56 in the office. Reviewed with DOD, Dr. Stanford Breed, who agreed with nurse's plan to have patient monitor BP 1-2x/daily for 1 week and record BP and HR and then call office with results. She was instructed to check BP while sitting and after resting for about 5 minutes. She voiced understanding.

## 2015-08-05 NOTE — Telephone Encounter (Signed)
Left detailed message stating referral made and it can take 7-10 business days and once insurance approved and appt made she will be notified, to CB with any further questions or concerns.

## 2015-08-09 ENCOUNTER — Other Ambulatory Visit: Payer: Self-pay | Admitting: Nurse Practitioner

## 2015-08-13 ENCOUNTER — Telehealth: Payer: Self-pay | Admitting: Family Medicine

## 2015-08-14 NOTE — Telephone Encounter (Signed)
Spoke with pt. Was confused on who the doctor she was seeing; New letter with additional information sent to pt

## 2015-08-23 ENCOUNTER — Other Ambulatory Visit: Payer: Self-pay | Admitting: Cardiovascular Disease

## 2015-09-12 ENCOUNTER — Telehealth: Payer: Self-pay | Admitting: Family Medicine

## 2015-09-12 ENCOUNTER — Ambulatory Visit (INDEPENDENT_AMBULATORY_CARE_PROVIDER_SITE_OTHER): Payer: Medicare Other | Admitting: Nurse Practitioner

## 2015-09-12 ENCOUNTER — Encounter: Payer: Self-pay | Admitting: Nurse Practitioner

## 2015-09-12 VITALS — BP 153/59 | HR 62 | Temp 98.2°F | Ht 62.0 in | Wt 174.0 lb

## 2015-09-12 DIAGNOSIS — J209 Acute bronchitis, unspecified: Secondary | ICD-10-CM | POA: Diagnosis not present

## 2015-09-12 MED ORDER — BENZONATATE 100 MG PO CAPS: 100.0000 mg | ORAL_CAPSULE | Freq: Three times a day (TID) | ORAL | Status: DC | PRN

## 2015-09-12 MED ORDER — AZITHROMYCIN 250 MG PO TABS: ORAL_TABLET | ORAL | Status: DC

## 2015-09-12 NOTE — Progress Notes (Signed)
   Subjective:    Patient ID: Kristina Gibbs, female    DOB: 08/23/1946, 69 y.o.   MRN: [id number]  HPI Patient comes in today c/o cough and congestion- she said she went to [place] y and she says it was so cold outside that she started gettting congestion and cough. Cough is nonproductive- runny nose.    Review of Systems  Constitutional: Positive for chills, activity change and fatigue.  HENT: Positive for congestion, postnasal drip, rhinorrhea and sore throat. Negative for ear pain.   Respiratory: Positive for cough (nonproductive).   Cardiovascular: Negative.   Gastrointestinal: Negative.   Genitourinary: Negative.   Neurological: Negative.   Psychiatric/Behavioral: Negative.   All other systems reviewed and are negative.      Objective:   Physical Exam  Constitutional: She is oriented to person, place, and time. She appears well-developed and well-nourished.  HENT:  Right Ear: Hearing, tympanic membrane, external ear and ear canal normal.  Left Ear: Hearing, tympanic membrane, external ear and ear canal normal.  Nose: Mucosal edema and rhinorrhea present. Right sinus exhibits no maxillary sinus tenderness and no frontal sinus tenderness. Left sinus exhibits no maxillary sinus tenderness and no frontal sinus tenderness.  Mouth/Throat: Uvula is midline, oropharynx is clear and moist and mucous membranes are normal.  Eyes: Pupils are equal, round, and reactive to light.  Neck: Normal range of motion. Neck supple.  Cardiovascular: Normal heart sounds.   Pulmonary/Chest: Effort normal and breath sounds normal. No respiratory distress. She has no wheezes.  Deep tight cough  Neurological: She is alert and oriented to person, place, and time.  Skin: Skin is warm.  Psychiatric: She has a normal mood and affect. Her behavior is normal. Judgment and thought content normal.   BP 153/59 mmHg  Pulse 62  Temp(Src) 98.2 F (36.8 C) (Oral)  Ht 5\' 2"  (1.575 m)  Wt 174  lb (78.926 kg)  BMI 31.82 kg/m2        Assessment & Plan:  1. Acute bronchitis, unspecified organism 1. Take meds as prescribed 2. Use a cool mist humidifier especially during the winter months and when heat has been humid. 3. Use saline nose sprays frequently 4. Saline irrigations of the nose can be very helpful if done frequently.  * 4X daily for 1 week*  * Use of a nettie pot can be helpful with this. Follow directions with this* 5. Drink plenty of fluids 6. Keep thermostat turn down low 7.For any cough or congestion  Use plain Mucinex- regular strength or max strength is fine   * Children- consult with Pharmacist for dosing 8. For fever or aces or pains- take tylenol or ibuprofen appropriate for age and weight.  * for fevers greater than 101 orally you may alternate ibuprofen and tylenol every  3 hours.    - azithromycin (ZITHROMAX Z-PAK) 250 MG tablet; As directed  Dispense: 1 each; Refill: 0 - benzonatate (TESSALON PERLES) 100 MG capsule; Take 1 capsule (100 mg total) by mouth 3 (three) times daily as needed for cough.  Dispense: 20 capsule; Refill: 0  Mary-Margaret Hassell Done, FNP

## 2015-09-12 NOTE — Telephone Encounter (Signed)
Pt has not been seen since Oct, suggested making an appt for this afternoon. Appt made.

## 2015-09-12 NOTE — Patient Instructions (Signed)

## 2015-10-16 ENCOUNTER — Encounter: Payer: Self-pay | Admitting: Nurse Practitioner

## 2015-10-29 ENCOUNTER — Other Ambulatory Visit: Payer: Self-pay | Admitting: Cardiovascular Disease

## 2015-10-29 NOTE — Telephone Encounter (Signed)
Rx request sent to pharmacy.  

## 2015-11-06 ENCOUNTER — Encounter: Payer: Self-pay | Admitting: Family Medicine

## 2015-11-06 ENCOUNTER — Ambulatory Visit (INDEPENDENT_AMBULATORY_CARE_PROVIDER_SITE_OTHER): Payer: Medicare HMO | Admitting: Family Medicine

## 2015-11-06 VITALS — BP 131/59 | HR 54 | Temp 97.0°F | Ht 62.0 in | Wt 170.2 lb

## 2015-11-06 DIAGNOSIS — Z0000 Encounter for general adult medical examination without abnormal findings: Secondary | ICD-10-CM | POA: Insufficient documentation

## 2015-11-06 DIAGNOSIS — M25473 Effusion, unspecified ankle: Secondary | ICD-10-CM

## 2015-11-06 DIAGNOSIS — N183 Chronic kidney disease, stage 3 unspecified: Secondary | ICD-10-CM

## 2015-11-06 DIAGNOSIS — R413 Other amnesia: Secondary | ICD-10-CM

## 2015-11-06 DIAGNOSIS — E875 Hyperkalemia: Secondary | ICD-10-CM | POA: Diagnosis not present

## 2015-11-06 DIAGNOSIS — I10 Essential (primary) hypertension: Secondary | ICD-10-CM | POA: Diagnosis not present

## 2015-11-06 DIAGNOSIS — I200 Unstable angina: Secondary | ICD-10-CM | POA: Diagnosis not present

## 2015-11-06 DIAGNOSIS — E785 Hyperlipidemia, unspecified: Secondary | ICD-10-CM

## 2015-11-06 DIAGNOSIS — I25118 Atherosclerotic heart disease of native coronary artery with other forms of angina pectoris: Secondary | ICD-10-CM | POA: Diagnosis not present

## 2015-11-06 DIAGNOSIS — N1832 Chronic kidney disease, stage 3b: Secondary | ICD-10-CM | POA: Insufficient documentation

## 2015-11-06 NOTE — Progress Notes (Signed)
   HPI  Patient presents today  for memory loss, lab work, chest pain  Memory loss Patient has been reporting problems about this for a few years. She explains that at times she gets to room and forgets what she went into the room 4. At times she also forgets events. She's had several MMSE's which have been stable from 27-29/30 She feels this is getting worse and requests a specialist visit.  Lab work She explains that she would like to have lab work done every 4 months, as this has always been the way she has had it done. Kidney disease She is avoiding NSAIDs and getting plenty of fluids.  Leg swelling She explains she has bilateral lateral ankle swelling that's just anterior to the lateral malleolus on both legs. Sometimes this is bothersome when she lays down at night.  Chest pain She has a known history of CAD with an MI in 1995 and a bypass in March 2000. She explains she had one episode of chest pain about one week ago which resolved with rest and nitroglycerin, otherwise she denies any chest pains.    PMH: Smoking status noted ROS: Per HPI  Objective: BP 131/59 mmHg  Pulse 54  Temp(Src) 97 F (36.1 C) (Oral)  Ht '5\' 2"'$  (1.575 m)  Wt 170 lb 3.2 oz (77.202 kg)  BMI 31.12 kg/m2 Gen: NAD, alert, cooperative with exam HEENT: NCAT CV: RRR, good S1/S2, no murmur Resp: CTABL, no wheezes, non-labored Ext: No edema, warm Neuro: Alert and oriented, No gross deficits  Assessment and plan:  # Ankle swelling Mild, recommended TED hoses With her kidney disease I would recommend avoiding Lasix  # Chronic kidney disease stage III Avoiding NSAIDs, Labs today  # Coronary artery disease, stable angina One episode of chest pain, resolved appropriately Discussed red flags for seeking emergent medical care which she understands. She has follow-up with cardiology in 4 months Continue aspirin, beta blocker, Ranexa, ARB, Imdur  # Memory loss Her MMSE is stable, 29 out of 30  today Referring to neurology for further evaluation and discussion  HCM- Hep C checked   Orders Placed This Encounter  Procedures  . CMP14+EGFR  . CBC  . Lipid Panel  . Hepatitis C Antibody  . Ambulatory referral to Neurology    Referral Priority:  Routine    Referral Type:  Consultation    Referral Reason:  Specialty Services Required    Requested Specialty:  Neurology    Number of Visits Requested:  [place], MD [facility] 11/06/2015, 9:16 AM

## 2015-11-06 NOTE — Patient Instructions (Addendum)
Great to see you!  Bring the stool blood test back within 1 week  Try TED hose for your ankle swelling  Lets see you back in 4 months  You will be called to schedule a neurology appointment

## 2015-11-07 LAB — CBC
Hematocrit: 39.3 % (ref 34.0–46.6)
Hemoglobin: 13.2 g/dL (ref 11.1–15.9)
MCH: 29.5 pg (ref 26.6–33.0)
MCHC: 33.6 g/dL (ref 31.5–35.7)
MCV: 88 fL (ref 79–97)
PLATELETS: 243 10*3/uL (ref 150–379)
RBC: 4.47 x10E6/uL (ref 3.77–5.28)
RDW: 14.1 % (ref 12.3–15.4)
WBC: 4.8 10*3/uL (ref 3.4–10.8)

## 2015-11-07 LAB — LIPID PANEL
Chol/HDL Ratio: 2.3 ratio units (ref 0.0–4.4)
Cholesterol, Total: 166 mg/dL (ref 100–199)
HDL: 72 mg/dL (ref >39)
LDL CALC: 76 mg/dL (ref 0–99)
Triglycerides: 88 mg/dL (ref 0–149)
VLDL CHOLESTEROL CAL: 18 mg/dL (ref 5–40)

## 2015-11-07 LAB — CMP14+EGFR
ALK PHOS: 61 IU/L (ref 39–117)
ALT: 23 IU/L (ref 0–32)
AST: 18 IU/L (ref 0–40)
Albumin/Globulin Ratio: 1.6 (ref 1.1–2.5)
Albumin: 4.4 g/dL (ref 3.5–4.8)
BILIRUBIN TOTAL: 0.6 mg/dL (ref 0.0–1.2)
BUN/Creatinine Ratio: 13 (ref 11–26)
BUN: 15 mg/dL (ref 8–27)
CO2: 23 mmol/L (ref 18–29)
Calcium: 9.5 mg/dL (ref 8.7–10.3)
Chloride: 97 mmol/L (ref 96–106)
Creatinine, Ser: 1.14 mg/dL — ABNORMAL HIGH (ref 0.57–1.00)
GFR, EST AFRICAN AMERICAN: 56 mL/min/{1.73_m2} — AB (ref >59)
GFR, EST NON AFRICAN AMERICAN: 48 mL/min/{1.73_m2} — AB (ref >59)
GLUCOSE: 104 mg/dL — AB (ref 65–99)
Globulin, Total: 2.8 g/dL (ref 1.5–4.5)
Potassium: 5.5 mmol/L — ABNORMAL HIGH (ref 3.5–5.2)
SODIUM: 137 mmol/L (ref 134–144)
Total Protein: 7.2 g/dL (ref 6.0–8.5)

## 2015-11-07 LAB — HEPATITIS C ANTIBODY

## 2015-11-07 NOTE — Addendum Note (Signed)
Addended by: Thana Ates on: 11/07/2015 10:53 AM   Modules accepted: Orders

## 2015-11-10 ENCOUNTER — Other Ambulatory Visit: Payer: Self-pay | Admitting: *Deleted

## 2015-11-10 MED ORDER — ALENDRONATE SODIUM 70 MG PO TABS: ORAL_TABLET | ORAL | Status: DC

## 2015-11-14 ENCOUNTER — Other Ambulatory Visit (INDEPENDENT_AMBULATORY_CARE_PROVIDER_SITE_OTHER): Payer: Medicare HMO

## 2015-11-14 DIAGNOSIS — E875 Hyperkalemia: Secondary | ICD-10-CM | POA: Diagnosis not present

## 2015-11-14 NOTE — Progress Notes (Signed)
Lab only 

## 2015-11-15 LAB — BMP8+EGFR
BUN / CREAT RATIO: 18 (ref 11–26)
BUN: 19 mg/dL (ref 8–27)
CHLORIDE: 99 mmol/L (ref 96–106)
CO2: 24 mmol/L (ref 18–29)
Calcium: 9.3 mg/dL (ref 8.7–10.3)
Creatinine, Ser: 1.07 mg/dL — ABNORMAL HIGH (ref 0.57–1.00)
GFR calc Af Amer: 60 mL/min/{1.73_m2} (ref >59)
GFR calc non Af Amer: 52 mL/min/{1.73_m2} — ABNORMAL LOW (ref >59)
GLUCOSE: 100 mg/dL — AB (ref 65–99)
Potassium: 5.1 mmol/L (ref 3.5–5.2)
SODIUM: 137 mmol/L (ref 134–144)

## 2015-11-17 ENCOUNTER — Encounter: Payer: Self-pay | Admitting: Neurology

## 2015-11-17 ENCOUNTER — Ambulatory Visit (INDEPENDENT_AMBULATORY_CARE_PROVIDER_SITE_OTHER): Payer: Medicare HMO | Admitting: Neurology

## 2015-11-17 VITALS — BP 151/56 | HR 56 | Ht 62.5 in | Wt 172.6 lb

## 2015-11-17 DIAGNOSIS — E538 Deficiency of other specified B group vitamins: Secondary | ICD-10-CM

## 2015-11-17 DIAGNOSIS — R251 Tremor, unspecified: Secondary | ICD-10-CM | POA: Diagnosis not present

## 2015-11-17 DIAGNOSIS — R413 Other amnesia: Secondary | ICD-10-CM

## 2015-11-17 DIAGNOSIS — F05 Delirium due to known physiological condition: Secondary | ICD-10-CM | POA: Diagnosis not present

## 2015-11-17 DIAGNOSIS — R410 Disorientation, unspecified: Secondary | ICD-10-CM

## 2015-11-17 DIAGNOSIS — R69 Illness, unspecified: Secondary | ICD-10-CM | POA: Diagnosis not present

## 2015-11-17 NOTE — Patient Instructions (Signed)
Remember to drink plenty of fluid, eat healthy meals and do not skip any meals. Try to eat protein with a every meal and eat a healthy snack such as fruit or nuts in between meals. Try to keep a regular sleep-wake schedule and try to exercise daily, particularly in the form of walking, 20-30 minutes a day, if you can.   As far as your medications are concerned, I would like to suggest: Discussed Aricept  As far as diagnostic testing: MRI of the brain, labs  I would like to see you back in 6 months, sooner if we need to. Please call us with any interim questions, concerns, problems, updates or refill requests.   Our phone number is [phone]. We also have an after hours call service for urgent matters and there is a physician on-call for urgent questions. For any emergencies you know to call 911 or go to the nearest emergency room

## 2015-11-17 NOTE — Progress Notes (Signed)
[facility]    Provider:  Dr Jaynee Eagles Referring Provider: Chevis Pretty, * Primary Care Physician:  Kenn File, MD  CC:  memory problems  HPI:  Kristina Gibbs is a 70 y.o. female here as a referral from Dr. Hassell Done for memory problems. PMHx CAD s/p MI 1995 with percutaneous revascularization, right coronary stenting 1996, multivessel bypass 2010,  HTN, HLD, previous smoker. Memory problems for 3-4 years, no head trauma or other inciting events but possibly starting around her bypass. She started noticing she was saying things wrong, spelling things wrong, writing worsening. She forgets where she puts things, she forgets peoples names but may remember weeks later or asks her husband, they can be people she just met or people she knows. In the last 6 months it is getting worse, difficulties remembering dates, has to write things down and forgets where she put the notes and she has to search for the paper. Weeks later she finds the paper and forgets why she wrote it. She can't remember the names of people in church, these are people she has known for a few years and she is so embarrassed because she can't remember their names. Patient pays all the bills, she missed two bills recently to the Dermatologist. She drives but not long distances. She has gotten lost when she drives but she often finds her way eventually.  She is repeating things, not remembering conversations she has with noticing. Husband is noticing and daughter is noticing. No hallucinations or delusions. Having weird dreams. Has good memory for the past. More short-term and recent memory. Mother started having dementia in her 75s and in her 73s started worsening significantly. She forgets appointments. No hallucinations or delusions.  Reviewed notes, labs and imaging from outside physicians, which showed:   11/2015: Hep c neg, CMP with 1.14 creatinine otherwise unremarkable, ldl 81  In 2015 hgba1c  5.5  Reviewed office visits from family medicine, patient complaining of memory loss for a few years, explained the time she gets to the room and forgets she went into the room for. She also forgets events. She's had several Mini-Mental status exam which have been stable from 2070 29/30. She is concerned for her short-term memory being poor. She reports that she been tested several times for dementia. She tends church and Bible studies has occasional word finding difficulty. In October 2016 patient reported memory changes stable for about a year.  Review of Systems: Patient complains of symptoms per HPI as well as the following symptoms: Weight gain, blurred vision, easy bruising, easy bleeding, chest pain, swelling and legs, shortness of breath, cough, feeling cold, increased thirst, flushing, ringing in ears, blood in stools, constipation, joint pain, allergies, runny nose, frequent infections, memory loss, confusion, headache, restless legs. Pertinent negatives per HPI. All others negative.   Social History   Social History  . Marital Status: Married    Spouse Name: [name]   . Number of Children: 3  . Years of Education: 12   Occupational History  . Retired    Social History Main Topics  . Smoking status: Former Smoker -- 0.50 packs/day    Types: Cigarettes    Quit date: 10/04/2009  . Smokeless tobacco: Never Used  . Alcohol Use: 0.6 oz/week    1 Glasses of wine per week     Comment: red wine once per month  . Drug Use: No  . Sexual Activity: Not Currently   Other Topics Concern  . Not on file  Social History Narrative   Lives w/ husband   Caffeine use: 3 cups coffee/day    Family History  Problem Relation Age of Onset  . Stroke Mother   . Diabetes Mother   . Kidney disease Mother   . Hypertension Mother   . Hyperlipidemia Mother   . Dementia Mother   . Heart disease Daughter     heart attack x2  . Heart disease Brother     2 heart attacks    Past Medical  History  Diagnosis Date  . Hypercholesteremia   . Memory impairment      MMSE 27/30  . Cancer ([place])     ovarian  . Arthritis   . Allergy   . Osteopenia   . Coronary artery disease   . Hypertension   . Unstable angina ([place]) 05/27/2014  . Stomach problems   . Heart murmur     Past Surgical History  Procedure Laterality Date  . Coronary artery bypass graft    . Abdominal hysterectomy    . Cholecystectomy    . Left heart catheterization with coronary angiogram N/A 05/27/2014    Procedure: LEFT HEART CATHETERIZATION WITH CORONARY ANGIOGRAM;  Surgeon: [name], MD;  Location: [facility] CATH LAB;  Service: Cardiovascular;  Laterality: N/A;  . Hysterectomy    . Eye surgery      bilateral cataract extraction  . Pcta      x7   . Hand surgery Left     injurred hand at work- nerve repair    Current Outpatient Prescriptions  Medication Sig Dispense Refill  . alendronate (FOSAMAX) 70 MG tablet TAKE 1 TABLET BY MOUTH ONCE A WEEK. TAKE WITH A FULL GLASS OF WATER ON AN EMPTY STOMACH. 12 tablet 1  . AMITIZA 24 MCG capsule TAKE 1 CAPSULE TWICE A DAY WITH A MEAL AS NEEDED 60 capsule 2  . aspirin EC 81 MG tablet Take 81 mg by mouth daily.    Marland Kitchen b complex vitamins tablet Take 2 tablets by mouth daily.    . calcium carbonate (OS-CAL) 600 MG TABS Take 600 mg by mouth daily.    . cholecalciferol (VITAMIN D) 1000 UNITS tablet Take 1,000 Units by mouth daily.    . clopidogrel (PLAVIX) 75 MG tablet TAKE 1 TABLET BY MOUTH DAILY 30 tablet 11  . CO ENZYME Q-10 PO Take 1 tablet by mouth daily.    . CRESTOR 20 MG tablet Take 1 tablet (20 mg total) by mouth daily. 30 tablet 11  . irbesartan (AVAPRO) 75 MG tablet TAKE 1 TABLET (75 MG TOTAL) BY MOUTH AT BEDTIME. 30 tablet 11  . isosorbide mononitrate (IMDUR) 60 MG 24 hr tablet TAKE 1 TABLET BY MOUTH DAILY 30 tablet 10  . lubiprostone (AMITIZA) 24 MCG capsule Take 24 mcg by mouth at bedtime.    . metoprolol succinate (TOPROL-XL) 25 MG 24 hr tablet TAKE 0.5  TABLETS (12.5 MG TOTAL) BY MOUTH DAILY. 30 tablet 6  . nitroGLYCERIN (NITROLINGUAL) 0.4 MG/SPRAY spray Place 1 spray under the tongue every 5 (five) minutes as needed for chest pain. 12 g 2  . omega-3 acid ethyl esters (LOVAZA) 1 G capsule TAKE ONE CAPSULE BY MOUTH 4 TIMES A DAY 120 capsule 4  . pantoprazole (PROTONIX) 40 MG tablet TAKE 1 TABLET BY MOUTH EVERY DAY 30 tablet 5  . PATADAY 0.2 % SOLN Place 1 drop into both eyes daily as needed.  6  . RANEXA 500 MG 12 hr tablet TAKE 1 TABLET (  500 MG TOTAL) BY MOUTH 2 (TWO) TIMES DAILY. 60 tablet 11  . ranolazine (RANEXA) 500 MG 12 hr tablet Take 1 tablet (500 mg total) by mouth 2 (two) times daily. 60 tablet 6  . triamterene-hydrochlorothiazide (DYAZIDE) 37.5-25 MG per capsule Take 1 each (1 capsule total) by mouth daily as needed. For fluid retention. 30 capsule 6  . vitamin C (ASCORBIC ACID) 500 MG tablet Take 1,000 mg by mouth daily.     Marland Kitchen azithromycin (ZITHROMAX Z-PAK) 250 MG tablet As directed (Patient not taking: Reported on 11/17/2015) 1 each 0   No current facility-administered medications for this visit.    Allergies as of 11/17/2015 - Review Complete 11/17/2015  Allergen Reaction Noted  . Codeine Nausea And Vomiting   . Hydrocortisone Itching 04/17/2013    Vitals: BP 151/56 mmHg  Pulse 56  Ht 5' 2.5" (1.588 m)  Wt 172 lb 9.6 oz (78.291 kg)  BMI 31.05 kg/m2 Last Weight:  Wt Readings from Last 1 Encounters:  11/17/15 172 lb 9.6 oz (78.291 kg)   Last Height:   Ht Readings from Last 1 Encounters:  11/17/15 5' 2.5" (1.588 m)   Physical exam: Exam: Gen: NAD, conversant            CV: RRR, no MRG. No Carotid Bruits. No peripheral edema, warm, nontender Eyes: Conjunctivae clear without exudates or hemorrhage  Neuro: Detailed Neurologic Exam  Speech:    Speech is normal; fluent and spontaneous with normal comprehension.  Cognition: MMSE - Mini Mental State Exam 11/17/2015 03/25/2015  Orientation to time 5 4  Orientation to  Place 5 5  Registration 3 3  Attention/ Calculation 5 5  Recall 0 3  Language- name 2 objects 2 2  Language- repeat 1 1  Language- follow 3 step command 3 3  Language- read & follow direction 1 1  Write a sentence 1 1  Copy design 1 1  Total score 27 29      The patient is oriented to person, place, and time;     recent and remote memory intact;     language fluent;     normal attention, concentration,     fund of knowledge Cranial Nerves:    The pupils are equal, round, and reactive to light. The fundi are normalt. Visual fields are full to finger confrontation. Extraocular movements are intact. Trigeminal sensation is intact and the muscles of mastication are normal. The face is symmetric. The palate elevates in the midline. Hearing intact. Voice is normal. Shoulder shrug is normal. The tongue has normal motion without fasciculations.   Coordination:    Normal finger to nose and heel to shin.   Gait:    No ataxia  Motor Observation:    No asymmetry, no atrophy, and no involuntary movements noted. Tone:    Normal muscle tone.    Posture:    Posture is normal. normal erect    Strength:    Strength is V/V in the upper and lower limbs.      Sensation: intact to LT     Reflex Exam:  DTR's:    Deep tendon reflexes in the upper and lower extremities are brisk for age and medical conditionsbilaterally.   Toes:    The toes are downgoing bilaterally.   Clonus:    Clonus is absent.       Assessment/Plan:   70 y.o. female here as a referral from Dr. Hassell Done for memory problems. PMHx CAD s/p MI 1995 with percutaneous  revascularization, right coronary stenting 1996, multivessel bypass 2010,  HTN, HLD, previous smoker. Memory problems for 3-4 years, worsening over the last 6 months.   MMSE 27/30. Likely Mild Cognitive Impairment but will follow clinically. MRi of the brain Labs today b12, tsh Discussed neurocognitive testing, will hol doff Discussed CREAD study, provided  information Aricept - check with cardiology Follow up in 6 months   Sarina Ill, MD  [facility] [address] [place], [place] [address]  Phone [phone] Fax [phone]

## 2015-11-18 ENCOUNTER — Telehealth: Payer: Self-pay | Admitting: *Deleted

## 2015-11-18 NOTE — Telephone Encounter (Signed)
LVM for pt to call about results. Gave [facility] phone number. Ok to inform pt labs good per Dr Jaynee Eagles.

## 2015-11-18 NOTE — Telephone Encounter (Signed)
-----   Message from Melvenia Beam, MD sent at 11/18/2015 12:56 PM EST ----- Labs look good thanks

## 2015-11-19 NOTE — Progress Notes (Signed)
Patient aware.

## 2015-11-19 NOTE — Telephone Encounter (Signed)
Pt returned Kristina Gibbs's call. Msg relayed to pt. She understood

## 2015-11-21 LAB — METHYLMALONIC ACID, SERUM: METHYLMALONIC ACID: 179 nmol/L (ref 0–378)

## 2015-11-21 LAB — THYROID PANEL WITH TSH
Free Thyroxine Index: 2 (ref 1.2–4.9)
T3 Uptake Ratio: 26 % (ref 24–39)
T4, Total: 7.6 ug/dL (ref 4.5–12.0)
TSH: 3.06 u[IU]/mL (ref 0.450–4.500)

## 2015-11-21 LAB — B12 AND FOLATE PANEL
Folate: >20 ng/mL (ref >3.0)
VITAMIN B 12: 1004 pg/mL — AB (ref 211–946)

## 2015-11-21 LAB — HIV ANTIBODY (ROUTINE TESTING W REFLEX): HIV SCREEN 4TH GENERATION: NONREACTIVE

## 2015-11-21 LAB — RPR: RPR: NONREACTIVE

## 2015-11-25 ENCOUNTER — Telehealth: Payer: Self-pay

## 2015-11-25 NOTE — Telephone Encounter (Signed)
error 

## 2015-11-25 NOTE — Telephone Encounter (Signed)
I attempted without success to speak to the patient in re the CREAD study. This was the first contact after the patient was referred by Dr. Jaynee Eagles on [id number]. Upon stating my name and where I was calling from, the patient requested to never be called back again; otherwise, she would call the police. Patient was belligerent, insulting, and hang up. Patient will be excluded unless she contacts [place] again.

## 2015-11-27 ENCOUNTER — Inpatient Hospital Stay: Admission: RE | Admit: 2015-11-27 | Payer: Medicare Other | Source: Ambulatory Visit

## 2015-11-27 ENCOUNTER — Other Ambulatory Visit: Payer: Self-pay | Admitting: Neurology

## 2015-11-30 ENCOUNTER — Ambulatory Visit
Admission: RE | Admit: 2015-11-30 | Discharge: 2015-11-30 | Disposition: A | Discharge status: Home / Self Care | Payer: Medicare HMO | Source: Ambulatory Visit | Attending: Neurology | Admitting: Neurology

## 2015-11-30 DIAGNOSIS — R410 Disorientation, unspecified: Secondary | ICD-10-CM

## 2015-11-30 DIAGNOSIS — F05 Delirium due to known physiological condition: Secondary | ICD-10-CM

## 2015-11-30 DIAGNOSIS — R251 Tremor, unspecified: Secondary | ICD-10-CM

## 2015-11-30 DIAGNOSIS — R413 Other amnesia: Secondary | ICD-10-CM

## 2015-12-06 ENCOUNTER — Telehealth: Payer: Self-pay | Admitting: Family Medicine

## 2015-12-08 ENCOUNTER — Ambulatory Visit: Admission: RE | Admit: 2015-12-08 | Payer: Medicare HMO | Source: Ambulatory Visit

## 2015-12-08 ENCOUNTER — Other Ambulatory Visit: Payer: Self-pay | Admitting: Family Medicine

## 2015-12-08 ENCOUNTER — Ambulatory Visit
Admission: RE | Admit: 2015-12-08 | Discharge: 2015-12-08 | Disposition: A | Discharge status: Home / Self Care | Payer: Medicare HMO | Source: Ambulatory Visit | Attending: Neurology | Admitting: Neurology

## 2015-12-08 ENCOUNTER — Other Ambulatory Visit: Payer: Self-pay | Admitting: Neurology

## 2015-12-08 DIAGNOSIS — R413 Other amnesia: Secondary | ICD-10-CM

## 2015-12-08 DIAGNOSIS — F05 Delirium due to known physiological condition: Secondary | ICD-10-CM

## 2015-12-08 DIAGNOSIS — R251 Tremor, unspecified: Secondary | ICD-10-CM | POA: Diagnosis not present

## 2015-12-08 DIAGNOSIS — R410 Disorientation, unspecified: Secondary | ICD-10-CM

## 2015-12-08 MED ORDER — LORAZEPAM 1 MG PO TABS: ORAL_TABLET | ORAL | Status: DC

## 2015-12-08 NOTE — Telephone Encounter (Signed)
Patient was informed that medication was called in to [facility] and also informed of driving precautions.

## 2015-12-08 NOTE — Telephone Encounter (Signed)
Covering PCP, please advise and send back to the pools. 

## 2015-12-08 NOTE — Telephone Encounter (Signed)
Ok with ativan X 1 mg, may repeat if needed  Laroy Apple, MD [facility] 12/08/2015, 12:49 PM

## 2015-12-08 NOTE — Telephone Encounter (Signed)
The requested med has been sent to the pharmacy.  Please let the patient know. Thanks, WS 

## 2015-12-08 NOTE — Addendum Note (Signed)
Addended by: Timmothy Euler on: 12/08/2015 12:51 PM   Modules accepted: Orders

## 2015-12-10 ENCOUNTER — Telehealth: Payer: Self-pay | Admitting: Cardiovascular Disease

## 2015-12-10 NOTE — Telephone Encounter (Signed)
Unsure that her symptoms are related to generic versus brand Crestor but spoke with pt.  She will hold Crestor for a week and call us to let us know if her symptoms have resolved or not.

## 2015-12-10 NOTE — Telephone Encounter (Signed)
Spoke w/ patient. She reports feeling unwell for about 2 weeks. "Shaky", fatigued, general malaise. She identifies no recent medication changes or new problems other than stated. She believes strongly that this cluster of symptoms coincides w/ a switch from brand Crestor to generic.  Pt instructed to make no med changes at this time, may be OK to hold statin to see if improvement, will route for advice & specific recommendations.

## 2015-12-10 NOTE — Telephone Encounter (Signed)
New message     Pt is very shakey this am.  Bp is 118/54 HR 56.  Her blood sugar is 87 after breakfast.  She said ever since she has been on rosuvastatin, she has had a lot of trembles.  Should she be put back on crestor?

## 2015-12-12 ENCOUNTER — Telehealth: Payer: Self-pay | Admitting: Family Medicine

## 2015-12-12 DIAGNOSIS — E785 Hyperlipidemia, unspecified: Secondary | ICD-10-CM

## 2015-12-15 ENCOUNTER — Other Ambulatory Visit: Payer: Self-pay | Admitting: Neurology

## 2015-12-15 ENCOUNTER — Telehealth: Payer: Self-pay | Admitting: *Deleted

## 2015-12-15 DIAGNOSIS — R9082 White matter disease, unspecified: Secondary | ICD-10-CM

## 2015-12-15 NOTE — Telephone Encounter (Signed)
Called and spoke to pt about MRI brain results per Dr Jaynee Eagles note. She verbalized understanding. She would like to proceed with MRI cervical spine. Advised she will get a call from [facility] to schedule. She verbalized understanding.

## 2015-12-15 NOTE — Telephone Encounter (Signed)
Patient aware.

## 2015-12-15 NOTE — Telephone Encounter (Signed)
Lipid panel placed per patient call.   Laroy Apple, MD [facility] 12/15/2015, 7:36 AM

## 2015-12-15 NOTE — Telephone Encounter (Signed)
-----   Message from Melvenia Beam, MD sent at 12/15/2015  2:31 PM EDT ----- MRI of the brain was normal for age. The MRI of the brain may have seen something in the neck area, possibly some arthritic changes and some changes in the spinal cord. This is likely artifact and possibly nothing at all. Would she be willing to have an mri of the cervical spine to further evaluate? But the MRI of the brain showed no strokes or masses or anything concerning for memory loss. thanks

## 2015-12-16 NOTE — Telephone Encounter (Signed)
Dr Jaynee Eagles- do you want her to still have MRI? It has been quite awhile since last one.

## 2015-12-16 NOTE — Telephone Encounter (Signed)
Per Dr Jaynee Eagles- she still wants pt to have MRI cervical spine. Previous MRI is too long ago. Please let pt know when she calls. Thank you

## 2015-12-16 NOTE — Telephone Encounter (Signed)
Patient called and stated she had forgotten that she did have an MRI of her back probably 8 years ago at [facility]. Does she need to have another one. Please call.

## 2015-12-16 NOTE — Telephone Encounter (Signed)
LVM returning pt call. Gave [facility] phone number.  

## 2015-12-16 NOTE — Telephone Encounter (Signed)
Called pt back. Relayed results again per Dr Jaynee Eagles note. Advised cervical spine is in the neck area. She had previous MRI of her low back. She stated she misunderstood. She will move forward with MRI. Advised she will get a call to schedule. Told her to call back if she has further questions. She verbalized understanding.

## 2015-12-16 NOTE — Telephone Encounter (Signed)
Pt would like to know why it is important to have another MRI. She is requesting a call back. Thank you

## 2015-12-17 ENCOUNTER — Telehealth: Payer: Self-pay

## 2015-12-17 ENCOUNTER — Other Ambulatory Visit: Payer: Medicare HMO

## 2015-12-17 ENCOUNTER — Telehealth: Payer: Self-pay | Admitting: Cardiovascular Disease

## 2015-12-17 DIAGNOSIS — E785 Hyperlipidemia, unspecified: Secondary | ICD-10-CM

## 2015-12-17 NOTE — Telephone Encounter (Signed)
Pt calling you back to let you know that she stopped her Crestor and felt much better. She wants to know what does she need to do now?

## 2015-12-17 NOTE — Telephone Encounter (Signed)
Returned call to patient. She took statin holiday from generic Crestor. Notes when she was getting name brand prior to generic option, she had no issues. She reports she has also failed pravastatin and atorvastatin due to intolerance/allergy. She did well on name brand Crestor but knows her insurer, [employer], will likely need a Prior Authorization for this.  Pt aware I will defer to [facility] for recommendations. If PA needed would defer to [facility].

## 2015-12-17 NOTE — Telephone Encounter (Signed)
Patient was here today for labwork, she would like for [place] to send a copy of the results to Dr. Quay Burow.  Also, she is being scheduled for an MRI of her cervical spine, she would like to have something to help her relax during this procedure sent to [facility].  I told her you are out of the office today and will return tomorrow.

## 2015-12-18 LAB — LIPID PANEL
CHOLESTEROL TOTAL: 206 mg/dL — AB (ref 100–199)
Chol/HDL Ratio: 2.9 ratio units (ref 0.0–4.4)
HDL: 72 mg/dL (ref >39)
LDL Calculated: 119 mg/dL — ABNORMAL HIGH (ref 0–99)
Triglycerides: 77 mg/dL (ref 0–149)
VLDL CHOLESTEROL CAL: 15 mg/dL (ref 5–40)

## 2015-12-18 MED ORDER — ALPRAZOLAM 0.5 MG PO TABS: ORAL_TABLET | ORAL | Status: DC

## 2015-12-18 NOTE — Telephone Encounter (Signed)
Pt is aware and states her husband will be driving her.

## 2015-12-18 NOTE — Telephone Encounter (Signed)
Can try for PA for brand.

## 2015-12-18 NOTE — Telephone Encounter (Signed)
Written Rx for xanax for MRI.   Will asak nursing ti nforma nd ask her not to drive after takling it.   Laroy Apple, MD [facility] 12/18/2015, 7:28 AM

## 2015-12-18 NOTE — Addendum Note (Signed)
Addended by: Timmothy Euler on: 12/18/2015 07:29 AM   Modules accepted: Orders

## 2015-12-19 ENCOUNTER — Other Ambulatory Visit: Payer: Self-pay | Admitting: Cardiovascular Disease

## 2015-12-19 ENCOUNTER — Telehealth: Payer: Self-pay

## 2015-12-19 MED ORDER — CRESTOR 20 MG PO TABS: 20.0000 mg | ORAL_TABLET | Freq: Every day | ORAL | Status: DC

## 2015-12-19 NOTE — Telephone Encounter (Signed)
Prior auth for Crestor 20mg  sent to [employer].

## 2015-12-19 NOTE — Telephone Encounter (Signed)
Please call,she said her primary doctor says her cholesterol is up real high.She said you can look on line and get the results. Her doctor is Dr Kenn File.

## 2015-12-19 NOTE — Telephone Encounter (Signed)
Prior auth for Crestor 20mg  (brand) sent to [facility].

## 2015-12-22 ENCOUNTER — Telehealth: Payer: Self-pay

## 2015-12-22 NOTE — Telephone Encounter (Signed)
Patient advised, Crestor approved.

## 2015-12-22 NOTE — Telephone Encounter (Signed)
Lansoprazole approved through 10/03/2016. [id number].

## 2015-12-22 NOTE — Telephone Encounter (Signed)
Brand name Crestor approved by [employer]. Through 10/04/2015. Ref # [id number]. Pharmacy notified.

## 2015-12-25 ENCOUNTER — Other Ambulatory Visit: Payer: Self-pay | Admitting: Neurology

## 2015-12-27 ENCOUNTER — Inpatient Hospital Stay: Admission: RE | Admit: 2015-12-27 | Payer: Medicare HMO | Source: Ambulatory Visit

## 2016-01-01 ENCOUNTER — Telehealth: Payer: Self-pay | Admitting: Family Medicine

## 2016-01-01 NOTE — Telephone Encounter (Signed)
Patient offered an appointment with another provider tomorrow and patient wanted to wait til Monday to see [facility]. Patient advised that if it gets worse to please let us know.

## 2016-01-05 ENCOUNTER — Other Ambulatory Visit: Payer: Self-pay | Admitting: Cardiovascular Disease

## 2016-01-05 ENCOUNTER — Encounter: Payer: Self-pay | Admitting: Family Medicine

## 2016-01-05 ENCOUNTER — Ambulatory Visit (INDEPENDENT_AMBULATORY_CARE_PROVIDER_SITE_OTHER): Payer: Medicare HMO | Admitting: Family Medicine

## 2016-01-05 VITALS — BP 121/54 | HR 54 | Temp 95.5°F | Ht 62.0 in | Wt 170.6 lb

## 2016-01-05 DIAGNOSIS — N183 Chronic kidney disease, stage 3 unspecified: Secondary | ICD-10-CM

## 2016-01-05 DIAGNOSIS — I10 Essential (primary) hypertension: Secondary | ICD-10-CM | POA: Diagnosis not present

## 2016-01-05 DIAGNOSIS — M25473 Effusion, unspecified ankle: Secondary | ICD-10-CM

## 2016-01-05 DIAGNOSIS — I25810 Atherosclerosis of coronary artery bypass graft(s) without angina pectoris: Secondary | ICD-10-CM

## 2016-01-05 NOTE — Patient Instructions (Addendum)
Great to see you!  I am getting an echo and labs to rule out any chance of your heart or kidney contributing to the swelling  You will be called to schedule the echo  We will call with labs within 1 week  Try the compression stockings

## 2016-01-05 NOTE — Progress Notes (Signed)
   HPI  Patient presents today ere for ankle swelling.  Patient explains that she's hadankle swelling slightly worse on the left for over week. We've had discussions previously abou tmild leg edema she has compression stockings now but has not been using them. She states that her left ankle seems to get swollen and darklike a mild bruise by the end of the day. She states that she walks on concrete all day because her house is on a slab. She denies any orthopnea, she did have some previously but has resolved.No chest pain, no new dyspnea, no exertional dyspnea  She has not made any medication changes late Maxide for the swelling to no avail  No ankle injury  PMH: Smoking status noted ROS: Per HPI  Objective: BP 121/54 mmHg  Pulse 54  Temp(Src) 95.5 F (35.3 C) (Oral)  Ht 5\' 2"  (1.575 m)  Wt 170 lb 9.6 oz (77.384 kg)  BMI 31.20 kg/m2 Gen: NAD, alert, cooperative with exam HEENT: NCAT CV: RRR, good S1/S2, no murmur Resp: CTABL, very soft crackles in BL bases Abd: SNTND, BS present, no guarding or organomegaly Ext: very mild trace edema distal to BL malleoli Neuro: Alert and oriented, No gross deficits  Assessment and plan:  # ankle swelling Mild BL, no injury. Has significant heart Hx and soft crackles so I have sent her for an echo BMP with mild renal Disease, although her creatinine is usually veyr well preserved Consider HCTZ low dose iof needed but her BP may not tolerate Compression stockings for now, RTC with any concerns or worsening. NO chest pain to be concerning for acute ischemia.   CAD s/p CABG No chest pain  ASA, statin, ARB, Imdur, BB, and ranexa No chest pain ECHO to rule out interval decreasing EF  CKD 3 GFR less than 60, mild, could be contributing if worsened BMP  HTN Well controlled Likely would not tolerate consistent HCTZ or lasix   Orders Placed This Encounter  Procedures  . Basic Metabolic Panel  . Echocardiogram    Standing Status:  Future     Number of Occurrences:      Standing Expiration Date: 04/05/2017    Order Specific Question:  Where should this test be performed    Answer:  [facility]    Order Specific Question:  Complete or Limited study?    Answer:  Complete    Order Specific Question:  With Image Enhancing Agent or without Image Enhancing Agent?    Answer:  With Image Enhancing Agent    Order Specific Question:  Expected Date:    Answer:  1 month    Order Specific Question:  Reason for exam-Echo    Answer:  Cardiomyopathy-Ischemic  414.8 / I25.5     Laroy Apple, MD [facility] 01/05/2016, 10:45 AM

## 2016-01-06 LAB — BASIC METABOLIC PANEL
BUN / CREAT RATIO: 16 (ref 12–28)
BUN: 17 mg/dL (ref 8–27)
CHLORIDE: 97 mmol/L (ref 96–106)
CO2: 24 mmol/L (ref 18–29)
Calcium: 9.2 mg/dL (ref 8.7–10.3)
Creatinine, Ser: 1.05 mg/dL — ABNORMAL HIGH (ref 0.57–1.00)
GFR calc non Af Amer: 54 mL/min/{1.73_m2} — ABNORMAL LOW (ref >59)
GFR, EST AFRICAN AMERICAN: 62 mL/min/{1.73_m2} (ref >59)
GLUCOSE: 98 mg/dL (ref 65–99)
Potassium: 5.2 mmol/L (ref 3.5–5.2)
SODIUM: 136 mmol/L (ref 134–144)

## 2016-01-06 NOTE — Telephone Encounter (Signed)
REFILL 

## 2016-01-12 ENCOUNTER — Other Ambulatory Visit: Payer: Self-pay | Admitting: Family Medicine

## 2016-01-12 ENCOUNTER — Telehealth: Payer: Self-pay

## 2016-01-12 NOTE — Telephone Encounter (Signed)
Insurance denied Echocardiogram   Patient called per Dr Wendi Snipes and told to follow up with her cardiologist   Patient said she would set up an appt.

## 2016-01-13 ENCOUNTER — Telehealth: Payer: Self-pay | Admitting: Neurology

## 2016-01-13 NOTE — Telephone Encounter (Signed)
Dr. Jaynee Eagles,   Patient called requesting for you to call and do a peer to peer for [employer]-medicare denial on the MRI -CPSINE.   According to [place]:   "per [facility] @ [facility], physician reviewed case and does not feel it is necessary, wants to speak with Dr Jaynee Eagles and see why, she wants it. Per [facility] doctor at [facility], said that the brain mri should be enough to see the area on the cervical spine"   Pt was scheduled to have MRI on 3/25 @ [facility].   You can call [facility] # [phone] CASE# [id number]   Please advise with auth or on how to proceed.   Thank you!

## 2016-01-15 ENCOUNTER — Telehealth: Payer: Self-pay | Admitting: Cardiovascular Disease

## 2016-01-15 NOTE — Telephone Encounter (Signed)
Close encounter 

## 2016-01-20 ENCOUNTER — Telehealth: Payer: Self-pay | Admitting: Family Medicine

## 2016-01-20 NOTE — Telephone Encounter (Signed)
Patient aware of time and location of echocardiogram.  Patient also states that insurance did approve it.

## 2016-01-23 ENCOUNTER — Ambulatory Visit (HOSPITAL_COMMUNITY): Payer: Medicare HMO | Attending: Cardiology

## 2016-01-23 ENCOUNTER — Other Ambulatory Visit: Payer: Self-pay

## 2016-01-23 DIAGNOSIS — E785 Hyperlipidemia, unspecified: Secondary | ICD-10-CM | POA: Diagnosis not present

## 2016-01-23 DIAGNOSIS — M25473 Effusion, unspecified ankle: Secondary | ICD-10-CM | POA: Diagnosis not present

## 2016-01-23 DIAGNOSIS — I119 Hypertensive heart disease without heart failure: Secondary | ICD-10-CM | POA: Diagnosis not present

## 2016-01-23 DIAGNOSIS — Z87891 Personal history of nicotine dependence: Secondary | ICD-10-CM | POA: Diagnosis not present

## 2016-01-23 DIAGNOSIS — I358 Other nonrheumatic aortic valve disorders: Secondary | ICD-10-CM | POA: Insufficient documentation

## 2016-01-23 DIAGNOSIS — I255 Ischemic cardiomyopathy: Secondary | ICD-10-CM | POA: Diagnosis present

## 2016-01-23 DIAGNOSIS — I25810 Atherosclerosis of coronary artery bypass graft(s) without angina pectoris: Secondary | ICD-10-CM | POA: Diagnosis not present

## 2016-02-03 ENCOUNTER — Other Ambulatory Visit: Payer: Self-pay | Admitting: *Deleted

## 2016-02-03 ENCOUNTER — Other Ambulatory Visit: Payer: Self-pay | Admitting: Cardiovascular Disease

## 2016-02-03 MED ORDER — OMEGA-3-ACID ETHYL ESTERS 1 G PO CAPS: 1.0000 g | ORAL_CAPSULE | Freq: Three times a day (TID) | ORAL | Status: DC

## 2016-02-03 MED ORDER — CRESTOR 20 MG PO TABS: 20.0000 mg | ORAL_TABLET | Freq: Every day | ORAL | Status: DC

## 2016-02-03 NOTE — Telephone Encounter (Signed)
REFILL 

## 2016-02-16 ENCOUNTER — Other Ambulatory Visit: Payer: Self-pay

## 2016-02-16 DIAGNOSIS — B078 Other viral warts: Secondary | ICD-10-CM | POA: Diagnosis not present

## 2016-02-16 DIAGNOSIS — D485 Neoplasm of uncertain behavior of skin: Secondary | ICD-10-CM | POA: Diagnosis not present

## 2016-02-24 ENCOUNTER — Encounter: Payer: Self-pay | Admitting: Family Medicine

## 2016-02-24 ENCOUNTER — Ambulatory Visit (INDEPENDENT_AMBULATORY_CARE_PROVIDER_SITE_OTHER): Payer: Medicare HMO | Admitting: Family Medicine

## 2016-02-24 ENCOUNTER — Encounter (INDEPENDENT_AMBULATORY_CARE_PROVIDER_SITE_OTHER): Payer: Self-pay

## 2016-02-24 VITALS — BP 115/58 | HR 62 | Temp 97.0°F | Ht 62.0 in | Wt 167.6 lb

## 2016-02-24 DIAGNOSIS — J189 Pneumonia, unspecified organism: Secondary | ICD-10-CM

## 2016-02-24 MED ORDER — AMOXICILLIN-POT CLAVULANATE 875-125 MG PO TABS: 1.0000 | ORAL_TABLET | Freq: Two times a day (BID) | ORAL | Status: DC

## 2016-02-24 MED ORDER — BENZONATATE 100 MG PO CAPS: 100.0000 mg | ORAL_CAPSULE | Freq: Two times a day (BID) | ORAL | Status: DC | PRN

## 2016-02-24 NOTE — Patient Instructions (Signed)

## 2016-02-24 NOTE — Progress Notes (Signed)
   HPI  Patient presents today here with cough.  Patient explains that her last 24-36 hour she's had cough, congestion, and posttussive emesis. She explains that she's had greater than 6-8 episodes of posttussis emesis over the last 24 hours. She is also coughing up large amount of phlegm. She describes it as a thick yellow phlegm.  She feels like she is wheezing as well. She is not a smoker.  She denies any facial pain.  PMH: Smoking status noted ROS: Per HPI  Objective: BP 115/58 mmHg  Pulse 62  Temp(Src) 97 F (36.1 C) (Oral)  Ht 5\' 2"  (1.575 m)  Wt 167 lb 9.6 oz (76.023 kg)  BMI 30.65 kg/m2 Gen: NAD, alert, cooperative with exam HEENT: NCAT CV: RRR, good S1/S2, no murmur Resp: Nonlabored, right lower lung field with persistent crackles Ext: No edema, warm Neuro: Alert and oriented, No gross deficits  Assessment and plan:  # Community-acquired pneumonia Clinical diagnosis given lung findings and persistent severe cough Treat with Augmentin Tessalon, she does not tolerate codeine Discussed over-the-counter cough medicines as well Return to clinic with any worsening or failure to improve    Meds ordered this encounter  Medications  . amoxicillin-clavulanate (AUGMENTIN) 875-125 MG tablet    Sig: Take 1 tablet by mouth 2 (two) times daily.    Dispense:  20 tablet    Refill:  0  . benzonatate (TESSALON) 100 MG capsule    Sig: Take 1 capsule (100 mg total) by mouth 2 (two) times daily as needed for cough.    Dispense:  20 capsule    Refill:  Duque, MD [facility] 02/24/2016, 11:46 AM

## 2016-03-12 ENCOUNTER — Ambulatory Visit (INDEPENDENT_AMBULATORY_CARE_PROVIDER_SITE_OTHER): Payer: Medicare HMO | Admitting: Cardiovascular Disease

## 2016-03-12 ENCOUNTER — Encounter: Payer: Self-pay | Admitting: Cardiovascular Disease

## 2016-03-12 VITALS — BP 144/60 | HR 52 | Ht 62.0 in | Wt 166.4 lb

## 2016-03-12 DIAGNOSIS — Z79899 Other long term (current) drug therapy: Secondary | ICD-10-CM

## 2016-03-12 DIAGNOSIS — I10 Essential (primary) hypertension: Secondary | ICD-10-CM

## 2016-03-12 DIAGNOSIS — E785 Hyperlipidemia, unspecified: Secondary | ICD-10-CM

## 2016-03-12 NOTE — Assessment & Plan Note (Signed)
History of hyperlipidemia recently changed to Crestor. We will recheck check a lipid and liver profile

## 2016-03-12 NOTE — Progress Notes (Signed)
03/12/2016 Kristina Gibbs   10/25/1945  [id number]  Primary Physician Kristina File, MD Primary Cardiologist: Kristina Harp MD Kristina Gibbs  HPI:  Kristina Gibbs is a 70 year old mildly overweight Caucasian female with a history of CAD previously taken care of by Kristina Gibbs. I last saw her in the office  03/12/15. She has a history of anterior wall myocardial infarction in 1995 with subsequent percutaneous revascularization to her RCA and LAD. She ultimately requiredmultivessel bypass surgery by Kristina Gibbs March of 2000 and LIMA to LAD, vein to a marginal branch of the circumflex and distal right coronary artery. Problems included hypertension and hyperlipidemia. I performed cardiac catheterization on her/2/10 revealing occluded vein grafts, patent LIMA and normal LV function. Since I saw her in the office year ago she's remained medically stable with only a few episodes of mitral responsive chest pain.   Current Outpatient Prescriptions  Medication Sig Dispense Refill  . alendronate (FOSAMAX) 70 MG tablet TAKE 1 TABLET BY MOUTH ONCE A WEEK. TAKE WITH A FULL GLASS OF WATER ON AN EMPTY STOMACH. 12 tablet 1  . AMITIZA 24 MCG capsule TAKE 1 CAPSULE TWICE A DAY WITH A MEAL AS NEEDED 60 capsule 2  . aspirin EC 81 MG tablet Take 81 mg by mouth daily.    Marland Kitchen b complex vitamins tablet Take 2 tablets by mouth daily.    . calcium carbonate (OS-CAL) 600 MG TABS Take 600 mg by mouth daily.    . cholecalciferol (VITAMIN D) 1000 UNITS tablet Take 1,000 Units by mouth daily.    . clopidogrel (PLAVIX) 75 MG tablet TAKE 1 TABLET BY MOUTH DAILY 30 tablet 2  . CO ENZYME Q-10 PO Take 1 tablet by mouth daily.    . CRESTOR 20 MG tablet Take 1 tablet (20 mg total) by mouth daily. 90 tablet 0  . irbesartan (AVAPRO) 75 MG tablet TAKE 1 TABLET (75 MG TOTAL) BY MOUTH AT BEDTIME. 30 tablet 11  . isosorbide mononitrate (IMDUR) 60 MG 24 hr tablet TAKE 1 TABLET BY MOUTH DAILY 30 tablet 10  .  metoprolol succinate (TOPROL-XL) 25 MG 24 hr tablet TAKE 0.5 TABLETS (12.5 MG TOTAL) BY MOUTH DAILY. 30 tablet 6  . nitroGLYCERIN (NITROLINGUAL) 0.4 MG/SPRAY spray Place 1 spray under the tongue every 5 (five) minutes as needed for chest pain. 12 g 2  . omega-3 acid ethyl esters (LOVAZA) 1 g capsule Take 1 capsule (1 g total) by mouth 3 (three) times daily. 120 capsule 4  . pantoprazole (PROTONIX) 40 MG tablet TAKE 1 TABLET BY MOUTH EVERY DAY 30 tablet 5  . PATADAY 0.2 % SOLN Place 1 drop into both eyes daily as needed.  6  . RANEXA 500 MG 12 hr tablet TAKE 1 TABLET (500 MG TOTAL) BY MOUTH 2 (TWO) TIMES DAILY. 60 tablet 11  . vitamin C (ASCORBIC ACID) 500 MG tablet Take 1,000 mg by mouth daily.      No current facility-administered medications for this visit.    Allergies  Allergen Reactions  . Codeine Nausea And Vomiting  . Hydrocortisone Itching    Social History   Social History  . Marital Status: Married    Spouse Name: Kristina Gibbs   . Number of Children: 3  . Years of Education: 12   Occupational History  . Retired    Social History Main Topics  . Smoking status: Former Smoker -- 0.50 packs/day    Types: Cigarettes    Quit date:  10/04/2009  . Smokeless tobacco: Never Used  . Alcohol Use: 0.6 oz/week    1 Glasses of wine per week     Comment: red wine once per month  . Drug Use: No  . Sexual Activity: Not Currently   Other Topics Concern  . Not on Gibbs   Social History Narrative   Lives w/ husband   Caffeine use: 3 cups coffee/day     Review of Systems: General: negative for chills, fever, night sweats or weight changes.  Cardiovascular: negative for chest pain, dyspnea on exertion, edema, orthopnea, palpitations, paroxysmal nocturnal dyspnea or shortness of breath Dermatological: negative for rash Respiratory: negative for cough or wheezing Urologic: negative for hematuria Abdominal: negative for nausea, vomiting, diarrhea, bright red blood per rectum, melena, or  hematemesis Neurologic: negative for visual changes, syncope, or dizziness All other systems reviewed and are otherwise negative except as noted above.    Blood pressure 144/60, pulse 52, height 5\' 2"  (1.575 m), weight 166 lb 6.4 oz (75.479 kg).  General appearance: alert and no distress Neck: no adenopathy, no carotid bruit, no JVD, supple, symmetrical, trachea midline and thyroid not enlarged, symmetric, no tenderness/mass/nodules Lungs: clear to auscultation bilaterally Heart: regular rate and rhythm, S1, S2 normal, no murmur, click, rub or gallop Extremities: extremities normal, atraumatic, no cyanosis or edema  EKG sinus bradycardia at 52 with ST or T-wave changes. I personally reviewed this EKG  ASSESSMENT AND PLAN:   Coronary atherosclerosis History of CAD status post anterior wall myocardial infarction 1995 with subsequent percutaneous revascularization of her RCA and LAD. She ultimately underwent coronary artery bypass grafting by Kristina Gibbs  March 2000 with a LIMA to LAD, vein to marginal branch of the circumflex and to the distal right coronary artery. I performed cardiac catheterization on her in 2010 revealing occluded grafts other than her LIMA. She denies chest pain or shortness of breath.  HLD (hyperlipidemia) History of hyperlipidemia recently changed to Crestor. We will recheck check a lipid and liver profile  Hypertension History of hypertension blood pressure measured today at 144/60. She is on Avapro and metoprolol. Continue current meds at current dosing      Kristina Harp MD Kristina Gibbs, Kristina Gibbs 03/12/2016 10:36 AM

## 2016-03-12 NOTE — Assessment & Plan Note (Signed)
History of hypertension blood pressure measured today at 144/60. She is on Avapro and metoprolol. Continue current meds at current dosing

## 2016-03-12 NOTE — Assessment & Plan Note (Addendum)
History of CAD status post anterior wall myocardial infarction 1995 with subsequent percutaneous revascularization of her RCA and LAD. She ultimately underwent coronary artery bypass grafting by Dr. Roxan Hockey  March 2000 with a LIMA to LAD, vein to marginal branch of the circumflex and to the distal right coronary artery. I performed cardiac catheterization on her in 2010 revealing occluded grafts other than her LIMA. She denies chest pain or shortness of breath.

## 2016-03-12 NOTE — Patient Instructions (Signed)
Medication Instructions:  Your physician recommends that you continue on your current medications as directed. Please refer to the Current Medication list given to you today.   Labwork: Your physician recommends that you return for lab work IN 2 MONTHS- YOU WILL NEED TO FAST, DO NOT HAVE ANYTHING TO EAT OR DRINK AFTER MIDNIGHT.   Testing/Procedures: NONE  Follow-Up: Your physician wants you to follow-up in: [place]. You will receive a reminder letter in the mail two months in advance. If you don't receive a letter, please call our office to schedule the follow-up appointment.   Any Other Special Instructions Will Be Listed Below (If Applicable).     If you need a refill on your cardiac medications before your next appointment, please call your pharmacy.

## 2016-03-14 ENCOUNTER — Other Ambulatory Visit: Payer: Self-pay | Admitting: Cardiovascular Disease

## 2016-03-15 NOTE — Telephone Encounter (Signed)
Rx(s) sent to pharmacy electronically.  

## 2016-03-16 ENCOUNTER — Other Ambulatory Visit: Payer: Self-pay | Admitting: Family Medicine

## 2016-03-20 ENCOUNTER — Other Ambulatory Visit: Payer: Self-pay | Admitting: Family Medicine

## 2016-03-25 ENCOUNTER — Telehealth: Payer: Self-pay | Admitting: Cardiovascular Disease

## 2016-03-25 NOTE — Telephone Encounter (Signed)
Returned call, spoke to patient - this was concerning a call she got from [facility]. Kristina Gibbs spoke w/ her regarding Crestor Rx and a notification from insurance for request of alt med. Kristina Gibbs will furnish a letter to insurance supporting continued Crestor due to pt hx and issues w other statins. Pt aware.

## 2016-03-25 NOTE — Telephone Encounter (Signed)
New Message  Pt stated she is returning RN phone call.Please call back and discuss.

## 2016-03-30 ENCOUNTER — Other Ambulatory Visit: Payer: Self-pay | Admitting: Cardiovascular Disease

## 2016-04-01 ENCOUNTER — Other Ambulatory Visit: Payer: Self-pay | Admitting: Cardiovascular Disease

## 2016-04-02 ENCOUNTER — Other Ambulatory Visit: Payer: Self-pay | Admitting: Cardiovascular Disease

## 2016-04-28 ENCOUNTER — Other Ambulatory Visit: Payer: Self-pay | Admitting: Cardiovascular Disease

## 2016-05-12 ENCOUNTER — Other Ambulatory Visit: Payer: Medicare HMO

## 2016-05-12 ENCOUNTER — Telehealth: Payer: Self-pay | Admitting: Cardiovascular Disease

## 2016-05-12 DIAGNOSIS — E785 Hyperlipidemia, unspecified: Secondary | ICD-10-CM

## 2016-05-12 DIAGNOSIS — Z79899 Other long term (current) drug therapy: Secondary | ICD-10-CM | POA: Diagnosis not present

## 2016-05-12 NOTE — Telephone Encounter (Signed)
New message        Pt is at [facility] with a [facility] lab order. They are on [facility].  Please put order in computer under [facility] as a future order and put clinic collect, then they can draw the blood.  Pt is there waiting.  Please call if any problems

## 2016-05-12 NOTE — Telephone Encounter (Signed)
Lab orders created and released for clinic collect.

## 2016-05-13 ENCOUNTER — Encounter: Payer: Self-pay | Admitting: Family Medicine

## 2016-05-13 ENCOUNTER — Ambulatory Visit (INDEPENDENT_AMBULATORY_CARE_PROVIDER_SITE_OTHER): Payer: Medicare HMO | Admitting: Family Medicine

## 2016-05-13 VITALS — BP 143/52 | HR 52 | Temp 97.2°F | Ht 62.0 in | Wt 167.8 lb

## 2016-05-13 DIAGNOSIS — R300 Dysuria: Secondary | ICD-10-CM | POA: Diagnosis not present

## 2016-05-13 DIAGNOSIS — I10 Essential (primary) hypertension: Secondary | ICD-10-CM

## 2016-05-13 DIAGNOSIS — L821 Other seborrheic keratosis: Secondary | ICD-10-CM

## 2016-05-13 NOTE — Progress Notes (Signed)
   HPI  Patient presents today to follow-up for hypertension, and has concerns about a skin lesion and possible UTI.  Dysuria and urethral irritation for about 2 days No fever, chills, sweats, or hematuria. Similar to previous UTIs.  Hypertension Good medication compliance Already medications easily. Average at home systolic 0000000, diastolic [id number]. Has had 2 episodes of exertional chest pain that were relieved with nitroglycerin about one month ago. These have not occurred recently. She was walking her dog and the hottest part of the day at the time. She understands reasons to seek emergency medical care and also reasons to call her cardiologist which we have reviewed again.  Skin lesion Present for a few weeks she states that the first one came up with small and itchy and began to change quickly. An additional lesion similar to the first started next to it.   PMH: Smoking status noted ROS: Per HPI  Objective: BP (!) 143/52   Pulse (!) 52   Temp 97.2 F (36.2 C) (Oral)   Ht 5\' 2"  (1.575 m)   Wt 167 lb 12.8 oz (76.1 kg)   BMI 30.69 kg/m  Gen: NAD, alert, cooperative with exam HEENT: NCAT, EOMI, PERRL CV: RRR, good S1/S2, no murmur Resp: CTABL, no wheezes, non-labored Ext: No edema, warm Neuro: Alert and oriented, No gross deficits  Skin 2 lesions on the left upper thigh, the first measuring 4 mm x 5 mm, the second measuring 2 mm x 3 mm. Both flesh-colored raised, and rough Sharp margins  Assessment and plan:  # Hypertension Reasonably well-controlled, sounds ARE well controlled at home No medication changes  # Seborrheic keratosis Skin lesions consistent with flesh-colored keratosis, possibly inflamed Low threshold for follow-up as they seem to have been growing recently. Consider cryotherapy versus shave biopsy, also she has a dermatologist who she could follow-up with.  # Dysuria Urinalysis today does not indicate infection,  consider developing infection  versus external urethritis from Candida, versus transient normal itching, I've encouraged her to have a low threshold for return   Orders Placed This Encounter  Procedures  . Urinalysis, Complete     Laroy Apple, MD [facility] 05/13/2016, 9:59 AM

## 2016-05-14 LAB — URINALYSIS, COMPLETE
BILIRUBIN UA: NEGATIVE
GLUCOSE, UA: NEGATIVE
KETONES UA: NEGATIVE
Nitrite, UA: NEGATIVE
PROTEIN UA: NEGATIVE
RBC UA: NEGATIVE
SPEC GRAV UA: 1.01 (ref 1.005–1.030)
Urobilinogen, Ur: 0.2 mg/dL (ref 0.2–1.0)
pH, UA: 5.5 (ref 5.0–7.5)

## 2016-05-14 LAB — MICROSCOPIC EXAMINATION
Epithelial Cells (non renal): >10 /hpf — AB (ref 0–10)
RBC, UA: NONE SEEN /hpf (ref 0–?)

## 2016-05-17 ENCOUNTER — Telehealth: Payer: Self-pay | Admitting: *Deleted

## 2016-05-17 ENCOUNTER — Ambulatory Visit: Payer: Medicare HMO | Admitting: Neurology

## 2016-05-17 NOTE — Telephone Encounter (Signed)
no showed f/u 

## 2016-05-24 ENCOUNTER — Encounter: Payer: Self-pay | Admitting: Neurology

## 2016-05-28 ENCOUNTER — Other Ambulatory Visit: Payer: Self-pay | Admitting: Family Medicine

## 2016-05-28 DIAGNOSIS — Z1231 Encounter for screening mammogram for malignant neoplasm of breast: Secondary | ICD-10-CM

## 2016-06-08 ENCOUNTER — Ambulatory Visit (INDEPENDENT_AMBULATORY_CARE_PROVIDER_SITE_OTHER): Payer: Medicare HMO | Admitting: *Deleted

## 2016-06-08 ENCOUNTER — Telehealth: Payer: Self-pay | Admitting: Gastroenterology

## 2016-06-08 ENCOUNTER — Encounter: Payer: Self-pay | Admitting: Gastroenterology

## 2016-06-08 VITALS — BP 126/50 | HR 56 | Temp 97.2°F | Ht 63.0 in | Wt 164.0 lb

## 2016-06-08 DIAGNOSIS — Z0000 Encounter for general adult medical examination without abnormal findings: Secondary | ICD-10-CM | POA: Diagnosis not present

## 2016-06-08 DIAGNOSIS — Z1211 Encounter for screening for malignant neoplasm of colon: Secondary | ICD-10-CM

## 2016-06-08 NOTE — Patient Instructions (Addendum)
  Kristina Gibbs ,  Thank you for taking time to come for your Medicare Wellness Visit. I appreciate your ongoing commitment to your health goals. Please review the following plan we discussed and let me know if I can assist you in the future.   These are the goals we discussed: Goals    . Exercise 3x per week (30 min per time)          Continue to walk for 30 minutes daily       This is a list of the screening recommended for you and due dates:  Health Maintenance  Topic Date Due  . Flu Shot  05/04/2016  . Colon Cancer Screening  09/30/2016  . DEXA scan (bone density measurement)  02/23/2017  . Mammogram  04/30/2017  . Tetanus Vaccine  10/05/2019  . Shingles Vaccine  Completed  .  Hepatitis C: One time screening is recommended by Center for Disease Control  (CDC) for  adults born from [birth year] through 1967.   Completed  . Pneumonia vaccines  Completed

## 2016-06-08 NOTE — Telephone Encounter (Signed)
A user error has taken place.

## 2016-06-08 NOTE — Progress Notes (Signed)
Subjective:   Kristina Gibbs is a 70 y.o. female who presents for Medicare Annual (Subsequent) preventive examination. Ms Flanery lives in an apartment with her husband. She has one daughter, one grandchild, and 2 great-grandchildren. They have a small dog that she enjoys walking. 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 She enjoys riding around with her husband and finding new scenery. She especially enjoys the mountains. She is a member of the [employer] and is active in their "[employer].   Review of Systems:  Positive for mild right shoulder pain. Initially injured it in a fall last year. It healed and was feeling ok until a she had another fall about a month ago. Now she has some tenderness and is applying Aspercreme once daily, which helps.  She does have some problems with constipation and is taking Amitiza as treatment. She also reports a history of rectal bleeding due to internal hemorrhoids. Last GI visit was in 2011. All other systems negative.        Objective:     Vitals: BP (!) 126/50 (BP Location: Right Arm, Patient Position: Sitting, Cuff Size: Small)   Pulse (!) 56   Temp 97.2 F (36.2 C) (Oral)   Ht 5\' 3"  (1.6 m)   Wt 164 lb (74.4 kg)   BMI 29.05 kg/m   Body mass index is 29.05 kg/m.   Tobacco History  Smoking Status  . Former Smoker  . Packs/day: 0.50  . Types: Cigarettes  . Quit date: 10/04/2009  Smokeless Tobacco  . Never Used     Counseling given: Not Answered   Past Medical History:  Diagnosis Date  . Allergy   . Arthritis   . Cancer ([place])    ovarian  . Coronary artery disease   . Heart murmur   . Hypercholesteremia   . Hypertension   . 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 Osteopenia   . Stomach problems   . Unstable angina ([place]) 05/27/2014   Past Surgical History:  Procedure Laterality Date  . ABDOMINAL HYSTERECTOMY    . CHOLECYSTECTOMY    . CORONARY ARTERY BYPASS GRAFT    . EYE  SURGERY     bilateral cataract extraction  . HAND SURGERY Left    injurred hand at work- Programmer, applications  . hysterectomy    . LEFT HEART CATHETERIZATION WITH CORONARY ANGIOGRAM N/A 05/27/2014   Procedure: LEFT HEART CATHETERIZATION WITH CORONARY ANGIOGRAM;  Surgeon: [name], MD;  Location[facility] CATH LAB;  Service: Cardiovascular;  Laterality: N/A;  . PCTA     x7    Family History  Problem Relation Age of Onset  . Stroke Mother   . Diabetes Mother   . Kidney disease Mother   . Hypertension Mother   . Hyperlipidemia Mother   . Dementia Mother   . Heart disease Daughter     heart attack x2  . Heart disease Brother     2 heart attacks   History  Sexual Activity  . Sexual activity: Not Currently    Outpatient Encounter Prescriptions as of 06/08/2016  Medication Sig  . alendronate (FOSAMAX) 70 MG tablet TAKE 1 TABLET BY MOUTH ONCE A WEEK. TAKE WITH A FULL GLASS OF WATER ON AN EMPTY STOMACH.  Marland Kitchen AMITIZA 24 MCG capsule TAKE 1 CAPSULE TWICE A DAY WITH A MEAL AS NEEDED (Patient taking differently: TAKE 1 CAPSULE DAILY)  . aspirin EC 81 MG tablet  Take 81 mg by mouth daily.  Marland Kitchen b complex vitamins tablet Take 2 tablets by mouth daily.  . calcium carbonate (OS-CAL) 600 MG TABS Take 600 mg by mouth daily.  . cholecalciferol (VITAMIN D) 1000 UNITS tablet Take 1,000 Units by mouth daily.  . clopidogrel (PLAVIX) 75 MG tablet TAKE 1 TABLET BY MOUTH DAILY  . CO ENZYME Q-10 PO Take 1 tablet by mouth daily.  . CRESTOR 20 MG tablet Take 1 tablet (20 mg total) by mouth daily.  . irbesartan (AVAPRO) 75 MG tablet TAKE 1 TABLET (75 MG TOTAL) BY MOUTH AT BEDTIME.  . isosorbide mononitrate (IMDUR) 60 MG 24 hr tablet TAKE 1 TABLET BY MOUTH DAILY  . metoprolol succinate (TOPROL-XL) 25 MG 24 hr tablet TAKE 0.5 TABLETS (12.5 MG TOTAL) BY MOUTH DAILY.  . nitroGLYCERIN (NITROLINGUAL) 0.4 MG/SPRAY spray Place 1 spray under the tongue every 5 (five) minutes as needed for chest pain.  Marland Kitchen omega-3 acid ethyl esters  (LOVAZA) 1 g capsule TAKE 1 CAPSULE BY MOUTH FOUR TIMES DAILY  . pantoprazole (PROTONIX) 40 MG tablet TAKE 1 TABLET BY MOUTH DAILY  . PATADAY 0.2 % SOLN Place 1 drop into both eyes daily as needed.  Marland Kitchen RANEXA 500 MG 12 hr tablet TAKE 1 TABLET (500 MG TOTAL) BY MOUTH 2 (TWO) TIMES DAILY.  . vitamin C (ASCORBIC ACID) 500 MG tablet Take 1,000 mg by mouth daily.    No facility-administered encounter medications on file as of 06/08/2016.     Activities of Daily Living In your present state of health, do you have any difficulty performing the following activities: 06/08/2016  Hearing? N  Vision? N  Difficulty concentrating or making decisions? Y  Walking or climbing stairs? N  Dressing or bathing? N  Doing errands, shopping? N  Some recent data might be hidden    Patient Care Team: Timmothy Euler, MD as PCP - General (Family Medicine) [name], MD as Consulting Physician (Cardiology)  Owens Loffler, MD as Consulting Physician (Gastroenterology)    Assessment:    Patient reports eating small meals throughout the day to combat hypoglycemia. She feels much better when she does this.   Exercise Activities and Dietary recommendations Current Exercise Habits: Home exercise routine, Type of exercise: walking, Time (Minutes): 30, Frequency (Times/Week): 7, Weekly Exercise (Minutes/Week): 210, Intensity: Mild  Goals    . Exercise 3x per week (30 min per time)          Continue to walk for 30 minutes daily      Fall Risk Fall Risk  06/08/2016 05/13/2016 02/24/2016 01/05/2016 11/06/2015  Falls in the past year? Yes No Yes No Yes  Number falls in past yr: 1 - 1 - 1  Injury with Fall? No - No - Yes  Risk for fall due to : History of fall(s) - - - -  Follow up Falls prevention discussed - - - -   Depression Screen PHQ 2/9 Scores 06/08/2016 05/13/2016 02/24/2016 01/05/2016  PHQ - 2 Score 0 0 0 0     Cognitive Testing MMSE - Mini Mental State Exam 06/08/2016 11/17/2015 03/25/2015  Orientation to  time 5 5 4   Orientation to Place 5 5 5   Registration 3 3 3   Attention/ Calculation 5 5 5   Recall 2 0 3  Language- name 2 objects 2 2 2   Language- repeat 1 1 1   Language- follow 3 step command 3 3 3   Language- read & follow direction 1 1 1  Write a sentence 1 1 1   Copy design 1 1 1   Total score 29 27 29     Immunization History  Administered Date(s) Administered  . Influenza,inj,Quad PF,36+ Mos 07/30/2013, 07/02/2014, 07/07/2015  . Pneumococcal Conjugate-13 02/24/2015  . Pneumococcal Polysaccharide-23 03/23/2011  . Tdap 06/23/2010  . Zoster 05/08/2010   Screening Tests Health Maintenance  Topic Date Due  . INFLUENZA VACCINE  05/04/2016  . COLONOSCOPY  09/30/2016  . DEXA SCAN  02/23/2017  . MAMMOGRAM  04/30/2017  . TETANUS/TDAP  10/05/2019  . ZOSTAVAX  Completed  . Hepatitis C Screening  Completed  . PNA vac Low Risk Adult  Completed   Patient has a Press photographer. I have asked that she bring a copy of that to be placed in her chart. She has paperwork for a Living Will but has not completed it. Once done she will bring a copy of that as well.   Plan:  Patient has history of rectal bleeding and internal hemorrhoids. She was last seen by Dr Ardis Hughs in 2011 and believes she is due for a colonoscopy. She reports having a virtual colonoscopy at that time because cardiology couldn't clear her for anesthesia.   During the course of the visit the patient was educated and counseled about the following appropriate screening and preventive services:   Vaccines to include Pneumoccal, Influenza, Hepatitis B, Td, Zostavax, HCV. She is up to date on her pneumonia, Zostavax, and Tdap and will receive the flu vaccine at the end of September.   Electrocardiogram- Last done 03/12/16  Cardiovascular Disease-Patient of Dr Gwenlyn Found  Colorectal cancer screening-Last colonoscopy 2011. Referral placed.   Bone density screening-Last done 2016. Due in 2018.  Diabetes screening-done with  routine labwork  Glaucoma screening-Yearly eye exams  Mammography-Scheduled for 06/10/16  Nutrition counseling-Continue small frequent meals to combat hypoglycemia. Increased water and fiber intake to help with constipation.   Follow up if shoulder pain persists.   Patient Instructions (the written plan) was given to the patient.   Chong Sicilian, RN  06/08/2016

## 2016-06-09 ENCOUNTER — Other Ambulatory Visit: Payer: Self-pay | Admitting: Family Medicine

## 2016-06-10 ENCOUNTER — Ambulatory Visit
Admission: RE | Admit: 2016-06-10 | Discharge: 2016-06-10 | Disposition: A | Discharge status: Home / Self Care | Payer: Medicare HMO | Source: Ambulatory Visit | Attending: Family Medicine | Admitting: Family Medicine

## 2016-06-10 DIAGNOSIS — Z1231 Encounter for screening mammogram for malignant neoplasm of breast: Secondary | ICD-10-CM

## 2016-06-11 ENCOUNTER — Encounter: Payer: Self-pay | Admitting: *Deleted

## 2016-06-21 ENCOUNTER — Other Ambulatory Visit: Payer: Self-pay | Admitting: Cardiovascular Disease

## 2016-07-12 ENCOUNTER — Ambulatory Visit (INDEPENDENT_AMBULATORY_CARE_PROVIDER_SITE_OTHER): Payer: Medicare HMO

## 2016-07-12 DIAGNOSIS — Z23 Encounter for immunization: Secondary | ICD-10-CM

## 2016-08-13 ENCOUNTER — Other Ambulatory Visit: Payer: Self-pay | Admitting: Cardiovascular Disease

## 2016-08-14 ENCOUNTER — Other Ambulatory Visit: Payer: Self-pay | Admitting: Cardiovascular Disease

## 2016-08-17 ENCOUNTER — Encounter (INDEPENDENT_AMBULATORY_CARE_PROVIDER_SITE_OTHER): Payer: Self-pay

## 2016-08-17 ENCOUNTER — Encounter: Payer: Self-pay | Admitting: Gastroenterology

## 2016-08-17 ENCOUNTER — Ambulatory Visit (INDEPENDENT_AMBULATORY_CARE_PROVIDER_SITE_OTHER): Payer: Medicare HMO | Admitting: Gastroenterology

## 2016-08-17 VITALS — BP 160/60 | HR 58 | Ht 62.8 in | Wt 168.0 lb

## 2016-08-17 DIAGNOSIS — Z1212 Encounter for screening for malignant neoplasm of rectum: Secondary | ICD-10-CM

## 2016-08-17 DIAGNOSIS — Z1211 Encounter for screening for malignant neoplasm of colon: Secondary | ICD-10-CM

## 2016-08-17 NOTE — Progress Notes (Signed)
Review of pertinent gastrointestinal problems: 1. "Virtual colonoscopy" 2011 showed no polyps.   HPI: This is a   very pleasant 70 year old woman   who was referred to me by Timmothy Euler, MD  to evaluate  routine risk for colon cancer .    Chief complaint is routine risk for colon cancer\  She tells me that her insurance company "insisted she have colon cancer screening"  Tells me her cardiologist will not let her have anesthesia.  She explained this to me several years ago and that was when she underwent the CT colonoscopy.  About once a year she has stool testing through he primary care doctor and it is has been negative.  Plavix and baby ASA once daily.  No colon cancer in her family.  She has no issues with her bowels. No overt bleeding, no significant constipation diarrhea.  Review of systems: Pertinent positive and negative review of systems were noted in the above HPI section. Complete review of systems was performed and was otherwise normal.   Past Medical History:  Diagnosis Date  . Allergy   . Arthritis   . Cancer ([place])    ovarian  . Coronary artery disease   . Heart murmur   . Hypercholesteremia   . Hypertension   . 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 Osteopenia   . Stomach problems   . Unstable angina ([place]) 05/27/2014    Past Surgical History:  Procedure Laterality Date  . ABDOMINAL HYSTERECTOMY    . CHOLECYSTECTOMY    . CORONARY ARTERY BYPASS GRAFT    . EYE SURGERY     bilateral cataract extraction  . HAND SURGERY Left    injurred hand at work- Programmer, applications  . hysterectomy    . LEFT HEART CATHETERIZATION WITH CORONARY ANGIOGRAM N/A 05/27/2014   Procedure: LEFT HEART CATHETERIZATION WITH CORONARY ANGIOGRAM;  Surgeon: [name], MD;  Location: [facility] CATH LAB;  Service: Cardiovascular;  Laterality: N/A;  . PCTA     x7     Current Outpatient Prescriptions  Medication Sig Dispense Refill  . alendronate (FOSAMAX) 70 MG tablet TAKE 1 TABLET BY  MOUTH ONCE A WEEK. TAKE WITH A FULL GLASS OF WATER ON AN EMPTY STOMACH. 12 tablet 1  . AMITIZA 24 MCG capsule TAKE 1 CAPSULE TWICE A DAY WITH A MEAL AS NEEDED (Patient taking differently: TAKE 1 CAPSULE at night) 60 capsule 2  . aspirin EC 81 MG tablet Take 81 mg by mouth daily.    Marland Kitchen b complex vitamins tablet Take 2 tablets by mouth daily.    . calcium carbonate (OS-CAL) 600 MG TABS Take 600 mg by mouth daily.    . cholecalciferol (VITAMIN D) 1000 UNITS tablet Take 1,000 Units by mouth daily.    . clopidogrel (PLAVIX) 75 MG tablet TAKE 1 TABLET BY MOUTH DAILY 30 tablet 6  . CO ENZYME Q-10 PO Take 1 tablet by mouth daily.    . CRESTOR 20 MG tablet Take 1 tablet (20 mg total) by mouth daily. 90 tablet 0  . irbesartan (AVAPRO) 75 MG tablet TAKE 1 TABLET (75 MG TOTAL) BY MOUTH AT BEDTIME. 30 tablet 11  . isosorbide mononitrate (IMDUR) 60 MG 24 hr tablet TAKE 1 TABLET BY MOUTH DAILY 30 tablet 10  . metoprolol succinate (TOPROL-XL) 25 MG 24 hr tablet TAKE 0.5 TABLETS (12.5 MG TOTAL) BY MOUTH DAILY. 30 tablet 5  . nitroGLYCERIN (NITROLINGUAL) 0.4 MG/SPRAY spray Place 1 spray under the  tongue every 5 (five) minutes as needed for chest pain. 12 g 2  . omega-3 acid ethyl esters (LOVAZA) 1 g capsule TAKE 1 CAPSULE BY MOUTH FOUR TIMES DAILY 120 capsule 11  . pantoprazole (PROTONIX) 40 MG tablet TAKE 1 TABLET BY MOUTH DAILY 30 tablet 5  . PATADAY 0.2 % SOLN Place 1 drop into both eyes daily as needed.  6  . RANEXA 500 MG 12 hr tablet TAKE 1 TABLET (500 MG TOTAL) BY MOUTH 2 (TWO) TIMES DAILY. 60 tablet 7  . vitamin C (ASCORBIC ACID) 500 MG tablet Take 1,000 mg by mouth daily.      No current facility-administered medications for this visit.     Allergies as of 08/17/2016 - Review Complete 08/17/2016  Allergen Reaction Noted  . Codeine Nausea And Vomiting   . Hydrocortisone Itching 04/17/2013    Family History  Problem Relation Age of Onset  . Stroke Mother   . Diabetes Mother   . Kidney disease  Mother   . Hypertension Mother   . Hyperlipidemia Mother   . Dementia Mother   . Heart disease Daughter     heart attack x2  . Heart disease Brother     2 heart attacks  . Colon cancer Neg Hx   . Stomach cancer Neg Hx   . Esophageal cancer Neg Hx   . Rectal cancer Neg Hx   . Liver cancer Neg Hx     Social History   Social History  . Marital status: Married    Spouse name: [name]   . Number of children: 3  . Years of education: 12   Occupational History  . Retired    Social History Main Topics  . Smoking status: Former Smoker    Packs/day: 0.50    Types: Cigarettes    Quit date: 10/04/2009  . Smokeless tobacco: Never Used  . Alcohol use 0.6 oz/week    1 Glasses of wine per week     Comment: red wine once per month  . Drug use: No  . Sexual activity: Not Currently    Birth control/ protection: Surgical, Post-menopausal   Other Topics Concern  . Not on file   Social History Narrative   Lives w/ husband   Caffeine use: 3 cups coffee/day     Physical Exam: BP (!) 160/60   Pulse (!) 58   Ht 5' 2.8" (1.595 m)   Wt 168 lb (76.2 kg)   BMI 29.95 kg/m  Constitutional: generally well-appearing Psychiatric: alert and oriented x3 Eyes: extraocular movements intact Mouth: oral pharynx moist, no lesions Neck: supple no lymphadenopathy Cardiovascular: heart regular rate and rhythm Lungs: clear to auscultation bilaterally Abdomen: soft, nontender, nondistended, no obvious ascites, no peritoneal signs, normal bowel sounds Extremities: no lower extremity edema bilaterally Skin: no lesions on visible extremities   Assessment and plan: 70 y.o. female with  Routine risk of colon cancer  She tells me that her insurance company "insists that she have a colonoscopy, colon cancer screening". I explained to her that there are many ways to undergo colon cancer screening and we discussed several of them. She is very nervous about sedation for colonoscopy and would prefer a  different type of test at least as first-line. We agreed on Colo guard stool testing first if this is positive she understands that we will have to revsit thequestionfa conoscop and  tha iwel [place] wih hcaitabu the safey  odig at [place] p[place] she understands if the [facility]  guard test is negative then she does not need colon cancer screening again for 3 years   Owens Loffler, MD [facility] 08/17/2016, 1:42 PM  Cc: Timmothy Euler, MD

## 2016-08-17 NOTE — Patient Instructions (Addendum)
Cologuard stool testing for colon cancer screening.  If this is positive then you will need a colonoscopy and will have to discuss with your cardiologist about holding your plavix.  If the cologuard stool test is negative then you will not need colon cancer screening again for 3 years.

## 2016-08-23 DIAGNOSIS — Z1211 Encounter for screening for malignant neoplasm of colon: Secondary | ICD-10-CM | POA: Diagnosis not present

## 2016-08-23 DIAGNOSIS — Z1212 Encounter for screening for malignant neoplasm of rectum: Secondary | ICD-10-CM | POA: Diagnosis not present

## 2016-08-24 ENCOUNTER — Telehealth: Payer: Self-pay | Admitting: *Deleted

## 2016-08-24 ENCOUNTER — Other Ambulatory Visit: Payer: Self-pay | Admitting: *Deleted

## 2016-08-24 MED ORDER — ALENDRONATE SODIUM 70 MG PO TABS: ORAL_TABLET | ORAL | 0 refills | Status: DC

## 2016-08-24 NOTE — Telephone Encounter (Signed)
Patient called requesting refill on fosamax.  Refill sent- she has a follow up appointment with Dr. Wendi Snipes on 09/13/16.

## 2016-09-01 ENCOUNTER — Other Ambulatory Visit: Payer: Self-pay

## 2016-09-01 LAB — COLOGUARD: Cologuard: NEGATIVE

## 2016-09-03 ENCOUNTER — Other Ambulatory Visit: Payer: Self-pay

## 2016-09-03 MED ORDER — CRESTOR 20 MG PO TABS: 20.0000 mg | ORAL_TABLET | Freq: Every day | ORAL | 1 refills | Status: DC

## 2016-09-13 ENCOUNTER — Encounter: Payer: Self-pay | Admitting: Family Medicine

## 2016-09-13 ENCOUNTER — Ambulatory Visit (INDEPENDENT_AMBULATORY_CARE_PROVIDER_SITE_OTHER): Payer: Medicare HMO | Admitting: Family Medicine

## 2016-09-13 VITALS — BP 138/75 | HR 67 | Temp 97.0°F | Ht 62.8 in | Wt 165.8 lb

## 2016-09-13 DIAGNOSIS — M79605 Pain in left leg: Secondary | ICD-10-CM | POA: Diagnosis not present

## 2016-09-13 DIAGNOSIS — E785 Hyperlipidemia, unspecified: Secondary | ICD-10-CM | POA: Diagnosis not present

## 2016-09-13 DIAGNOSIS — I10 Essential (primary) hypertension: Secondary | ICD-10-CM

## 2016-09-13 NOTE — Progress Notes (Signed)
   HPI  Patient presents today with leg pain and for follow-up of chronic medical conditions.  Hyperlipidemia Previous LDL was 61, checked 4 months ago, LFTs were normal that time as well. She stopped Crestor, generic, due to myopathies. She's tolerating brand name Crestor easily.  Left leg pain Patient states that she is concerned about blood clot Initially 2-3 weeks ago had some bruising of the right lower extremity without explanation, also scribes a knot in the popliteal fossa similar to the neck. The bruising and the knot have resolved Now is having left lower extremity pain for about one week, describes it as dull achy pain that is persistent. Sometimes is worse with walking, sometimes it's worse with sitting and improves with walking. Subjective leg swelling, however not severe and patient is not very sure that she has had real swelling. Injury Stable back pain.  Hypertension Good medication compliance  PMH: Smoking status noted ROS: Per HPI  Objective: BP 138/75   Pulse 67   Temp 97 F (36.1 C) (Oral)   Ht 5' 2.8" (1.595 m)   Wt 165 lb 12.8 oz (75.2 kg)   BMI 29.56 kg/m  Gen: NAD, alert, cooperative with exam HEENT: NCAT CV: RRR, good S1/S2, no murmur Resp: CTABL, no wheezes, non-labored Ext: No edema, calf circumference 36 cm BL, Mild L calf tenderness, no erythema,  Neuro: Alert and oriented, No gross deficits MSK:  Negative straight leg raise, negative Faber test bilaterally   Assessment and plan:  # Left leg pain Unclear etiology, however blood clot appears very unlikely with symmetric calf Circumference and no edema. Patient also wonders if this is due to arthritis in the cold weather, reassurance provided, low threshold for return, return to clinic as needed  # Hyperlipidemia Previous LDL 61, LFTs good as well Patient tolerating Crestor easily Repeat labs in 3-4 months Plan on every 6 month checks, previously checking every 3 months.  #  Hypertension Well-controlled with Avapro, metoprolol, Imdur No changes    Laroy Apple, MD [facility] Schro[facility] 09/13/2016, 10:30 AM

## 2016-09-13 NOTE — Patient Instructions (Signed)
Great to see you!  Lets see you again in March or A;pril.   Please call or come back if yo uhave any worsening of your leg symptoms.

## 2016-10-11 ENCOUNTER — Telehealth: Payer: Self-pay | Admitting: Cardiovascular Disease

## 2016-10-11 NOTE — Telephone Encounter (Signed)
Kristina Gibbs is calling because she states that she is having reoccurring chest pains and she states that after she takes a nitroglycerin pill or puts liquid in her mouth, the pain stops. Please call, thanks.

## 2016-10-11 NOTE — Telephone Encounter (Signed)
Spoke to patient . Recurrent chest pain  More frequent. ntg relief discomfort . Offered appointment for today - patient request for tomorrow appt schedule1/9/17 at 9 am  Patient aware.

## 2016-10-12 ENCOUNTER — Ambulatory Visit: Payer: Medicare HMO | Admitting: Student

## 2016-10-12 ENCOUNTER — Other Ambulatory Visit: Payer: Self-pay | Admitting: Cardiovascular Disease

## 2016-10-12 NOTE — Telephone Encounter (Signed)
Rx has been sent to the pharmacy electronically. ° °

## 2016-10-16 ENCOUNTER — Other Ambulatory Visit: Payer: Self-pay | Admitting: Cardiovascular Disease

## 2016-10-18 ENCOUNTER — Telehealth: Payer: Self-pay | Admitting: Family Medicine

## 2016-10-18 MED ORDER — BENZONATATE 100 MG PO CAPS: 100.0000 mg | ORAL_CAPSULE | Freq: Two times a day (BID) | ORAL | 0 refills | Status: DC | PRN

## 2016-10-18 MED ORDER — OSELTAMIVIR PHOSPHATE 75 MG PO CAPS: 75.0000 mg | ORAL_CAPSULE | Freq: Two times a day (BID) | ORAL | 0 refills | Status: DC

## 2016-10-18 NOTE — Telephone Encounter (Signed)
Please advise 

## 2016-10-18 NOTE — Telephone Encounter (Signed)
Tessalon sent for cough.   Laroy Apple, MD [facility] 10/18/2016, 2:29 PM

## 2016-10-18 NOTE — Telephone Encounter (Signed)
Rx(s) sent to pharmacy electronically.  

## 2016-10-18 NOTE — Telephone Encounter (Signed)
Rx sent for tamiflu  Laroy Apple, MD [place] 10/18/2016, 12:14 PM

## 2016-10-18 NOTE — Addendum Note (Signed)
Addended by: Timmothy Euler on: 10/18/2016 02:30 PM   Modules accepted: Orders

## 2016-10-18 NOTE — Telephone Encounter (Signed)
Last office visit 09-13-16. Please advise if medication will be ordered or NTBS.

## 2016-10-18 NOTE — Telephone Encounter (Signed)
Aware. 

## 2016-10-27 ENCOUNTER — Ambulatory Visit (INDEPENDENT_AMBULATORY_CARE_PROVIDER_SITE_OTHER): Payer: Medicare HMO | Admitting: Family Medicine

## 2016-10-27 ENCOUNTER — Encounter: Payer: Self-pay | Admitting: Family Medicine

## 2016-10-27 VITALS — BP 110/53 | HR 56 | Temp 97.4°F | Ht 62.8 in | Wt 166.4 lb

## 2016-10-27 DIAGNOSIS — J209 Acute bronchitis, unspecified: Secondary | ICD-10-CM | POA: Diagnosis not present

## 2016-10-27 MED ORDER — FLUTICASONE PROPIONATE 50 MCG/ACT NA SUSP: 1.0000 | Freq: Two times a day (BID) | NASAL | 6 refills | Status: DC | PRN

## 2016-10-27 MED ORDER — DOXYCYCLINE HYCLATE 100 MG PO TABS: 100.0000 mg | ORAL_TABLET | Freq: Two times a day (BID) | ORAL | 0 refills | Status: DC

## 2016-10-27 NOTE — Progress Notes (Signed)
BP (!) 110/53   Pulse (!) 56   Temp 97.4 F (36.3 C) (Oral)   Ht 5' 2.8" (1.595 m)   Wt 166 lb 6.4 oz (75.5 kg)   BMI 29.66 kg/m    Subjective:    Patient ID: Kristina Gibbs, female    DOB: 02-09-1946, 71 y.o.   MRN: [id number]  HPI: Kristina Gibbs is a 71 y.o. female presenting on 10/27/2016 for Cough (green sputum and nasal discharge) and Sinus Problem   HPI Cough and sinus congestion and chest congestion Patient has been having cough and sinus congestion and chest congestion with been going on for the past 11 days and she thought was getting better but then has worsened over the past few days. She did have some low-grade fevers of 100.1 and 100.3. She also has had some intermittent wheezing at night. She is also been bringing up green phlegm and both her coughing and nasal drainage as well. She denies any shortness of breath. Her husband was ill 3 weeks ago with a flulike illness and was seen at the [place] and she thinks she may have gotten this from him.  Relevant past medical, surgical, family and social history reviewed and updated as indicated. Interim medical history since our last visit reviewed. Allergies and medications reviewed and updated.  Review of Systems  Constitutional: Positive for fever. Negative for chills.  HENT: Positive for congestion, postnasal drip, rhinorrhea, sinus pressure, sneezing and sore throat. Negative for ear discharge and ear pain.   Eyes: Negative for pain, redness and visual disturbance.  Respiratory: Positive for cough and wheezing. Negative for chest tightness and shortness of breath.   Cardiovascular: Negative for chest pain and leg swelling.  Genitourinary: Negative for difficulty urinating and dysuria.  Musculoskeletal: Negative for back pain and gait problem.  Skin: Negative for rash.  Neurological: Negative for light-headedness and headaches.  Psychiatric/Behavioral: Negative for agitation and behavioral problems.  All other  systems reviewed and are negative.   Per HPI unless specifically indicated above    Objective:    BP (!) 110/53   Pulse (!) 56   Temp 97.4 F (36.3 C) (Oral)   Ht 5' 2.8" (1.595 m)   Wt 166 lb 6.4 oz (75.5 kg)   BMI 29.66 kg/m   Wt Readings from Last 3 Encounters:  10/27/16 166 lb 6.4 oz (75.5 kg)  09/13/16 165 lb 12.8 oz (75.2 kg)  08/17/16 168 lb (76.2 kg)    Physical Exam  Constitutional: She is oriented to person, place, and time. She appears well-developed and well-nourished. No distress.  HENT:  Right Ear: Tympanic membrane, external ear and ear canal normal.  Left Ear: Tympanic membrane, external ear and ear canal normal.  Nose: Mucosal edema and rhinorrhea present. No epistaxis. Right sinus exhibits no maxillary sinus tenderness and no frontal sinus tenderness. Left sinus exhibits no maxillary sinus tenderness and no frontal sinus tenderness.  Mouth/Throat: Uvula is midline and mucous membranes are normal. Posterior oropharyngeal edema and posterior oropharyngeal erythema present. No oropharyngeal exudate or tonsillar abscesses.  Eyes: Conjunctivae are normal.  Neck: Neck supple. No thyromegaly present.  Cardiovascular: Normal rate, regular rhythm, normal heart sounds and intact distal pulses.   No murmur heard. Pulmonary/Chest: Effort normal. No respiratory distress. She has no wheezes. She has rhonchi in the right upper field and the left upper field. She has no rales.  Musculoskeletal: Normal range of motion. She exhibits no edema or tenderness.  Lymphadenopathy:  She has no cervical adenopathy.  Neurological: She is alert and oriented to person, place, and time. Coordination normal.  Skin: Skin is warm and dry. No rash noted. She is not diaphoretic.  Psychiatric: She has a normal mood and affect. Her behavior is normal.  Nursing note and vitals reviewed.     Assessment & Plan:   Problem List Items Addressed This Visit    None    Visit Diagnoses    Acute  bronchitis, unspecified organism    -  Primary   Relevant Medications   doxycycline (VIBRA-TABS) 100 MG tablet   fluticasone (FLONASE) 50 MCG/ACT nasal spray      Follow up plan: Return if symptoms worsen or fail to improve.  Counseling provided for all of the vaccine components No orders of the defined types were placed in this encounter.   Caryl Pina, MD [facility] 10/27/2016, 11:46 AM

## 2016-10-28 ENCOUNTER — Ambulatory Visit (HOSPITAL_COMMUNITY)
Admission: RE | Admit: 2016-10-28 | Discharge: 2016-10-28 | Disposition: A | Discharge status: Home / Self Care | Payer: Medicare HMO | Source: Ambulatory Visit | Attending: Family Medicine | Admitting: Family Medicine

## 2016-10-28 ENCOUNTER — Ambulatory Visit (INDEPENDENT_AMBULATORY_CARE_PROVIDER_SITE_OTHER): Payer: Medicare HMO | Admitting: Family Medicine

## 2016-10-28 ENCOUNTER — Other Ambulatory Visit: Payer: Self-pay

## 2016-10-28 ENCOUNTER — Encounter: Payer: Self-pay | Admitting: Family Medicine

## 2016-10-28 VITALS — BP 119/46 | HR 57 | Temp 97.4°F | Ht 62.8 in | Wt 167.6 lb

## 2016-10-28 DIAGNOSIS — M79605 Pain in left leg: Secondary | ICD-10-CM

## 2016-10-28 DIAGNOSIS — M545 Low back pain: Secondary | ICD-10-CM | POA: Diagnosis not present

## 2016-10-28 DIAGNOSIS — M7989 Other specified soft tissue disorders: Secondary | ICD-10-CM | POA: Diagnosis not present

## 2016-10-28 NOTE — Progress Notes (Signed)
   HPI  Patient presents today here with concern for blood clot.  Patient has left low back pain with left leg pain over the last 4 weeks. Whenever this began she began also having left leg swelling. She states that some days it's much worse than others having very severe pitting edema. On other days it's not very severe. Whenever the swelling is worse the pain is worse.  She has not had a period of inactivity lately, she has not had any recent long car rides or plane rides. She does not have a history of blood clot. She is on aspirin and Plavix for heart disease with stents.  She has bleeding internal hemorrhoids often.  PMH: Smoking status noted ROS: Per HPI  Objective: BP (!) 119/46   Pulse (!) 57   Temp 97.4 F (36.3 C) (Oral)   Ht 5' 2.8" (1.595 m)   Wt 167 lb 9.6 oz (76 kg)   BMI 29.88 kg/m  Gen: NAD, alert, cooperative with exam HEENT: NCAT CV: RRR, good S1/S2, no murmur Resp: CTABL, no wheezes, non-labored Ext: Trace pitting edema of the left lower extremity compared to no edema on the right Neuro: Alert and oriented, 4/5 strength in left lower extremity compared to 5/5 on the right, sensation intact bilateral lower extremities 36 cm calf circumference on L, 35 Cm on R, NO homan sign, no calf tenderness MSK:  Moderate tenderness to palpation of left-sided low back  Assessment and plan:  # Left leg swelling Unexplained intermittent left leg swelling, patient is very concerned about DVT. Sent for same-day venous duplex to rule out DVT. New problem If patient is positive I would recommend transfer to ED given use of dual antiplatelets and some concern for frequent rectal bleeding from internal hemorrhoids   # Left low back pain most likely the leg pain is due to radicular nerve pain. Short course of steroids, however patient states that the pain is not so severe that it's unbearable, however she is just very anxious about the possibility of DVT.     Laroy Apple,  MD [facility] 10/28/2016, 2:31 PM

## 2016-10-28 NOTE — Patient Instructions (Signed)
Great to see you!  We will work on getting the ultrasound set up.

## 2016-11-10 ENCOUNTER — Other Ambulatory Visit: Payer: Self-pay

## 2016-11-10 MED ORDER — CRESTOR 20 MG PO TABS: 20.0000 mg | ORAL_TABLET | Freq: Every day | ORAL | 1 refills | Status: DC

## 2016-11-11 ENCOUNTER — Telehealth: Payer: Self-pay | Admitting: Cardiovascular Disease

## 2016-11-11 NOTE — Telephone Encounter (Signed)
Returned call to patient. She needs PA for BRAND NAME crestor. She states she cannot take generic. She is out of the medication.   Message routed to [facility]

## 2016-11-11 NOTE — Telephone Encounter (Signed)
New Message  Pt c/o medication issue:  1. Name of Medication: crestor   2. How are you currently taking this medication (dosage and times per day)? 20mg    3. Are you having a reaction (difficulty breathing--STAT)? No   4. What is your medication issue? Pt call requesting to speak with RN. Pt states her pharmacy would like to get an authorization for pt insurance company for medication to be refilled. Please call back to discuss

## 2016-11-13 ENCOUNTER — Other Ambulatory Visit: Payer: Self-pay | Admitting: Family Medicine

## 2016-11-15 ENCOUNTER — Telehealth: Payer: Self-pay | Admitting: Cardiovascular Disease

## 2016-11-15 NOTE — Telephone Encounter (Signed)
New Message     Needs pre authorization from Dr Gwenlyn Found for the [employer], please call [employer] [phone]

## 2016-11-15 NOTE — Telephone Encounter (Signed)
Returned call. Patient states she can only take name brand Crestor, has reaction to other statin medications and generic crestor. Insurance needs call from our office relaying this information. Aware we'll reach out to insurance company to advise.  I attempted to reach, was placed on hold and unable to speak to representative today.

## 2016-11-18 NOTE — Telephone Encounter (Signed)
Spoke to pt to let her know Crestor was approved by [employer]. She stated she was able to pick it up from the pharmacy yesterday.

## 2016-11-18 NOTE — Telephone Encounter (Signed)
Spoke to [employer]. They said the Brand Crestor 20 mg was approved and filled

## 2016-11-28 ENCOUNTER — Other Ambulatory Visit: Payer: Self-pay | Admitting: Cardiovascular Disease

## 2016-11-29 NOTE — Telephone Encounter (Signed)
Rx has been sent to the pharmacy electronically. ° °

## 2016-12-07 DIAGNOSIS — R079 Chest pain, unspecified: Secondary | ICD-10-CM | POA: Diagnosis not present

## 2016-12-07 DIAGNOSIS — H9319 Tinnitus, unspecified ear: Secondary | ICD-10-CM | POA: Diagnosis not present

## 2016-12-07 DIAGNOSIS — E782 Mixed hyperlipidemia: Secondary | ICD-10-CM | POA: Diagnosis not present

## 2016-12-07 DIAGNOSIS — Z7902 Long term (current) use of antithrombotics/antiplatelets: Secondary | ICD-10-CM | POA: Diagnosis not present

## 2016-12-07 DIAGNOSIS — Z7983 Long term (current) use of bisphosphonates: Secondary | ICD-10-CM | POA: Diagnosis not present

## 2016-12-07 DIAGNOSIS — I119 Hypertensive heart disease without heart failure: Secondary | ICD-10-CM | POA: Diagnosis not present

## 2016-12-07 DIAGNOSIS — I25119 Atherosclerotic heart disease of native coronary artery with unspecified angina pectoris: Secondary | ICD-10-CM | POA: Diagnosis not present

## 2016-12-07 DIAGNOSIS — K5900 Constipation, unspecified: Secondary | ICD-10-CM | POA: Diagnosis not present

## 2016-12-07 DIAGNOSIS — K219 Gastro-esophageal reflux disease without esophagitis: Secondary | ICD-10-CM | POA: Diagnosis not present

## 2016-12-07 DIAGNOSIS — Z0000 Encounter for general adult medical examination without abnormal findings: Secondary | ICD-10-CM | POA: Diagnosis not present

## 2016-12-07 DIAGNOSIS — Z7982 Long term (current) use of aspirin: Secondary | ICD-10-CM | POA: Diagnosis not present

## 2016-12-07 DIAGNOSIS — E7800 Pure hypercholesterolemia, unspecified: Secondary | ICD-10-CM | POA: Diagnosis not present

## 2016-12-07 DIAGNOSIS — K649 Unspecified hemorrhoids: Secondary | ICD-10-CM | POA: Diagnosis not present

## 2016-12-07 DIAGNOSIS — I259 Chronic ischemic heart disease, unspecified: Secondary | ICD-10-CM | POA: Diagnosis not present

## 2016-12-14 ENCOUNTER — Encounter: Payer: Self-pay | Admitting: Family Medicine

## 2016-12-14 ENCOUNTER — Ambulatory Visit: Payer: Medicare HMO | Admitting: Family Medicine

## 2016-12-14 ENCOUNTER — Ambulatory Visit (INDEPENDENT_AMBULATORY_CARE_PROVIDER_SITE_OTHER): Payer: Medicare HMO | Admitting: Family Medicine

## 2016-12-14 VITALS — BP 127/49 | HR 52 | Temp 97.1°F | Ht 62.8 in | Wt 168.8 lb

## 2016-12-14 DIAGNOSIS — E785 Hyperlipidemia, unspecified: Secondary | ICD-10-CM | POA: Diagnosis not present

## 2016-12-14 DIAGNOSIS — I10 Essential (primary) hypertension: Secondary | ICD-10-CM | POA: Diagnosis not present

## 2016-12-14 DIAGNOSIS — J301 Allergic rhinitis due to pollen: Secondary | ICD-10-CM | POA: Diagnosis not present

## 2016-12-14 NOTE — Addendum Note (Signed)
Addended by: Karle Plumber on: 12/14/2016 01:32 PM   Modules accepted: Orders

## 2016-12-14 NOTE — Patient Instructions (Signed)
Great to see you!  Try plain claritin, zyrtec, or allegra ( not claritin D or zyrtec D), store brand is perfectly ok.  You may also try flonase ( store brand is ok) 2 sprays per nostril once daily.

## 2016-12-14 NOTE — Progress Notes (Signed)
   HPI  Patient presents today here for follow-up of hyperlipidemia, hypertension, and to discuss rhinitis.  Patient has had issues with postnasal drip recently. Patient states that she has not been using any antihistamines due to concerns with chronic kidney disease.  Hypertension Good medication compliance, and has had one episode of chest pain in the last few months which resolved with nitroglycerin.  Hyperlipidemia Patient had difficulty with generic Crestor, she has had brand-name Crestor authorized for her by her insurance company, she is tolerating this easily as expected.  PMH: Smoking status noted ROS: Per HPI  Objective: BP (!) 127/49   Pulse (!) 52   Temp 97.1 F (36.2 C) (Oral)   Ht 5' 2.8" (1.595 m)   Wt 168 lb 12.8 oz (76.6 kg)   BMI 30.09 kg/m  Gen: NAD, alert, cooperative with exam HEENT: NCAT, edematous nasal mucosa on the right, boggy swollen mucosa otherwise in the nose CV: RRR, good S1/S2, no murmur Resp: CTABL, no wheezes, non-labored Ext: No edema, warm Neuro: Alert and oriented, No gross deficits  Assessment and plan:  # Allergic rhinitis Discussed using non-decongestant antihistamines, also nasal steroids.  # Hypertension Well-controlled on current medications, no changes Pt will return for labs  # Hyperlipidemia Doing well with Crestor Labs checked last about 6 months ago, plan annual labs    Laroy Apple, MD [facility] 12/14/2016, 1:14 PM

## 2016-12-15 ENCOUNTER — Other Ambulatory Visit (INDEPENDENT_AMBULATORY_CARE_PROVIDER_SITE_OTHER): Payer: Medicare HMO

## 2016-12-15 DIAGNOSIS — I10 Essential (primary) hypertension: Secondary | ICD-10-CM

## 2016-12-15 LAB — BMP8+EGFR
BUN/Creatinine Ratio: 16 (ref 12–28)
BUN: 17 mg/dL (ref 8–27)
CO2: 25 mmol/L (ref 18–29)
CREATININE: 1.07 mg/dL — AB (ref 0.57–1.00)
Calcium: 9 mg/dL (ref 8.7–10.3)
Chloride: 97 mmol/L (ref 96–106)
GFR calc Af Amer: 60 mL/min/{1.73_m2} (ref >59)
GFR, EST NON AFRICAN AMERICAN: 52 mL/min/{1.73_m2} — AB (ref >59)
GLUCOSE: 92 mg/dL (ref 65–99)
Potassium: 4.7 mmol/L (ref 3.5–5.2)
SODIUM: 137 mmol/L (ref 134–144)

## 2016-12-15 LAB — CBC WITH DIFFERENTIAL/PLATELET
BASOS ABS: 0 10*3/uL (ref 0.0–0.2)
Basos: 1 %
EOS (ABSOLUTE): 0.2 10*3/uL (ref 0.0–0.4)
Eos: 3 %
Hematocrit: 36.2 % (ref 34.0–46.6)
Hemoglobin: 12.3 g/dL (ref 11.1–15.9)
Immature Grans (Abs): 0 10*3/uL (ref 0.0–0.1)
Immature Granulocytes: 0 %
LYMPHS ABS: 1.3 10*3/uL (ref 0.7–3.1)
Lymphs: 23 %
MCH: 29.9 pg (ref 26.6–33.0)
MCHC: 34 g/dL (ref 31.5–35.7)
MCV: 88 fL (ref 79–97)
MONOS ABS: 0.4 10*3/uL (ref 0.1–0.9)
Monocytes: 7 %
Neutrophils Absolute: 3.8 10*3/uL (ref 1.4–7.0)
Neutrophils: 66 %
Platelets: 220 10*3/uL (ref 150–379)
RBC: 4.12 x10E6/uL (ref 3.77–5.28)
RDW: 14.1 % (ref 12.3–15.4)
WBC: 5.7 10*3/uL (ref 3.4–10.8)

## 2016-12-22 ENCOUNTER — Other Ambulatory Visit: Payer: Self-pay | Admitting: Cardiovascular Disease

## 2017-02-05 ENCOUNTER — Other Ambulatory Visit: Payer: Self-pay | Admitting: Family Medicine

## 2017-03-05 ENCOUNTER — Emergency Department (HOSPITAL_COMMUNITY): Payer: Medicare HMO

## 2017-03-05 ENCOUNTER — Encounter (HOSPITAL_COMMUNITY): Payer: Self-pay

## 2017-03-05 ENCOUNTER — Observation Stay (HOSPITAL_COMMUNITY)
Admission: EM | Admit: 2017-03-05 | Discharge: 2017-03-06 | Disposition: A | Discharge status: Home / Self Care | Payer: Medicare HMO | Attending: Internal Medicine | Admitting: Internal Medicine

## 2017-03-05 DIAGNOSIS — I129 Hypertensive chronic kidney disease with stage 1 through stage 4 chronic kidney disease, or unspecified chronic kidney disease: Secondary | ICD-10-CM | POA: Insufficient documentation

## 2017-03-05 DIAGNOSIS — K219 Gastro-esophageal reflux disease without esophagitis: Secondary | ICD-10-CM | POA: Diagnosis not present

## 2017-03-05 DIAGNOSIS — Z9861 Coronary angioplasty status: Secondary | ICD-10-CM

## 2017-03-05 DIAGNOSIS — E785 Hyperlipidemia, unspecified: Secondary | ICD-10-CM | POA: Diagnosis not present

## 2017-03-05 DIAGNOSIS — Z951 Presence of aortocoronary bypass graft: Secondary | ICD-10-CM | POA: Diagnosis not present

## 2017-03-05 DIAGNOSIS — I10 Essential (primary) hypertension: Secondary | ICD-10-CM | POA: Diagnosis present

## 2017-03-05 DIAGNOSIS — R079 Chest pain, unspecified: Secondary | ICD-10-CM

## 2017-03-05 DIAGNOSIS — Z7982 Long term (current) use of aspirin: Secondary | ICD-10-CM | POA: Insufficient documentation

## 2017-03-05 DIAGNOSIS — N183 Chronic kidney disease, stage 3 (moderate): Secondary | ICD-10-CM | POA: Insufficient documentation

## 2017-03-05 DIAGNOSIS — I25119 Atherosclerotic heart disease of native coronary artery with unspecified angina pectoris: Secondary | ICD-10-CM | POA: Diagnosis not present

## 2017-03-05 DIAGNOSIS — I25810 Atherosclerosis of coronary artery bypass graft(s) without angina pectoris: Secondary | ICD-10-CM

## 2017-03-05 DIAGNOSIS — I2510 Atherosclerotic heart disease of native coronary artery without angina pectoris: Secondary | ICD-10-CM

## 2017-03-05 DIAGNOSIS — I25709 Atherosclerosis of coronary artery bypass graft(s), unspecified, with unspecified angina pectoris: Secondary | ICD-10-CM | POA: Diagnosis not present

## 2017-03-05 DIAGNOSIS — K5900 Constipation, unspecified: Secondary | ICD-10-CM | POA: Diagnosis not present

## 2017-03-05 DIAGNOSIS — Z8543 Personal history of malignant neoplasm of ovary: Secondary | ICD-10-CM | POA: Insufficient documentation

## 2017-03-05 DIAGNOSIS — R072 Precordial pain: Secondary | ICD-10-CM | POA: Diagnosis not present

## 2017-03-05 DIAGNOSIS — Z87891 Personal history of nicotine dependence: Secondary | ICD-10-CM | POA: Insufficient documentation

## 2017-03-05 DIAGNOSIS — Z79899 Other long term (current) drug therapy: Secondary | ICD-10-CM | POA: Insufficient documentation

## 2017-03-05 LAB — CBC
HEMATOCRIT: 34.5 % — AB (ref 36.0–46.0)
HEMATOCRIT: 36.1 % (ref 36.0–46.0)
HEMOGLOBIN: 11.5 g/dL — AB (ref 12.0–15.0)
HEMOGLOBIN: 12 g/dL (ref 12.0–15.0)
MCH: 29.2 pg (ref 26.0–34.0)
MCH: 29.3 pg (ref 26.0–34.0)
MCHC: 33.3 g/dL (ref 30.0–36.0)
MCHC: 33.2 g/dL (ref 30.0–36.0)
MCV: 87.6 fL (ref 78.0–100.0)
MCV: 88.3 fL (ref 78.0–100.0)
PLATELETS: 180 10*3/uL (ref 150–400)
Platelets: 194 10*3/uL (ref 150–400)
RBC: 3.94 MIL/uL (ref 3.87–5.11)
RBC: 4.09 MIL/uL (ref 3.87–5.11)
RDW: 12.9 % (ref 11.5–15.5)
RDW: 13 % (ref 11.5–15.5)
WBC: 4 10*3/uL (ref 4.0–10.5)
WBC: 4.5 10*3/uL (ref 4.0–10.5)

## 2017-03-05 LAB — CREATININE, SERUM
CREATININE: 1.2 mg/dL — AB (ref 0.44–1.00)
GFR, EST AFRICAN AMERICAN: 51 mL/min — AB (ref >60)
GFR, EST NON AFRICAN AMERICAN: 44 mL/min — AB (ref >60)

## 2017-03-05 LAB — BASIC METABOLIC PANEL
ANION GAP: 5 (ref 5–15)
BUN: 15 mg/dL (ref 6–20)
CHLORIDE: 103 mmol/L (ref 101–111)
CO2: 26 mmol/L (ref 22–32)
Calcium: 8.9 mg/dL (ref 8.9–10.3)
Creatinine, Ser: 1.1 mg/dL — ABNORMAL HIGH (ref 0.44–1.00)
GFR calc non Af Amer: 49 mL/min — ABNORMAL LOW (ref >60)
GFR, EST AFRICAN AMERICAN: 57 mL/min — AB (ref >60)
Glucose, Bld: 87 mg/dL (ref 65–99)
POTASSIUM: 4.4 mmol/L (ref 3.5–5.1)
Sodium: 134 mmol/L — ABNORMAL LOW (ref 135–145)

## 2017-03-05 LAB — I-STAT TROPONIN, ED: Troponin i, poc: 0 ng/mL (ref 0.00–0.08)

## 2017-03-05 LAB — TROPONIN I

## 2017-03-05 MED ORDER — IRBESARTAN 75 MG PO TABS
75.0000 mg | ORAL_TABLET | Freq: Every day | ORAL | Status: DC
  Administered 2017-03-05: 75 mg via ORAL
  Filled 2017-03-05: qty 1

## 2017-03-05 MED ORDER — ROSUVASTATIN CALCIUM 10 MG PO TABS
20.0000 mg | ORAL_TABLET | Freq: Every day | ORAL | Status: DC
  Filled 2017-03-05: qty 2

## 2017-03-05 MED ORDER — PANTOPRAZOLE SODIUM 40 MG PO TBEC
40.0000 mg | DELAYED_RELEASE_TABLET | Freq: Every day | ORAL | Status: DC
  Administered 2017-03-06: 40 mg via ORAL
  Filled 2017-03-05: qty 1

## 2017-03-05 MED ORDER — OLOPATADINE HCL 0.1 % OP SOLN
1.0000 [drp] | Freq: Two times a day (BID) | OPHTHALMIC | Status: DC
  Administered 2017-03-05 – 2017-03-06 (×2): 1 [drp] via OPHTHALMIC
  Filled 2017-03-05: qty 5

## 2017-03-05 MED ORDER — B COMPLEX-C PO TABS
1.0000 | ORAL_TABLET | Freq: Every day | ORAL | Status: DC
Start: 2017-03-06 — End: 2017-03-06
  Filled 2017-03-05: qty 1

## 2017-03-05 MED ORDER — ISOSORBIDE MONONITRATE ER 60 MG PO TB24
60.0000 mg | ORAL_TABLET | Freq: Every day | ORAL | Status: DC
  Filled 2017-03-05: qty 1

## 2017-03-05 MED ORDER — METOPROLOL SUCCINATE ER 25 MG PO TB24
12.5000 mg | ORAL_TABLET | Freq: Every day | ORAL | Status: DC
  Administered 2017-03-06: 12.5 mg via ORAL
  Filled 2017-03-05: qty 1

## 2017-03-05 MED ORDER — ASPIRIN EC 81 MG PO TBEC
81.0000 mg | DELAYED_RELEASE_TABLET | Freq: Every day | ORAL | Status: DC
  Filled 2017-03-05 (×2): qty 1

## 2017-03-05 MED ORDER — ALPRAZOLAM 0.25 MG PO TABS: 0.2500 mg | ORAL_TABLET | Freq: Two times a day (BID) | ORAL | Status: DC | PRN

## 2017-03-05 MED ORDER — ENOXAPARIN SODIUM 40 MG/0.4ML ~~LOC~~ SOLN
40.0000 mg | SUBCUTANEOUS | Status: DC
  Administered 2017-03-05: 40 mg via SUBCUTANEOUS
  Filled 2017-03-05: qty 0.4

## 2017-03-05 MED ORDER — ONDANSETRON HCL 4 MG/2ML IJ SOLN: 4.0000 mg | Freq: Four times a day (QID) | INTRAMUSCULAR | Status: DC | PRN

## 2017-03-05 MED ORDER — VITAMIN C 500 MG PO TABS
1000.0000 mg | ORAL_TABLET | Freq: Every day | ORAL | Status: DC
  Filled 2017-03-05: qty 2

## 2017-03-05 MED ORDER — LUBIPROSTONE 24 MCG PO CAPS
24.0000 ug | ORAL_CAPSULE | Freq: Every day | ORAL | Status: DC
  Administered 2017-03-05: 24 ug via ORAL
  Filled 2017-03-05: qty 1

## 2017-03-05 MED ORDER — CALCIUM CARBONATE 1250 (500 CA) MG PO TABS
625.0000 mg | ORAL_TABLET | Freq: Every day | ORAL | Status: DC
  Filled 2017-03-05: qty 1

## 2017-03-05 MED ORDER — GI COCKTAIL ~~LOC~~: 30.0000 mL | Freq: Four times a day (QID) | ORAL | Status: DC | PRN

## 2017-03-05 MED ORDER — VITAMIN D 1000 UNITS PO TABS
1000.0000 [IU] | ORAL_TABLET | Freq: Every day | ORAL | Status: DC
  Filled 2017-03-05: qty 1

## 2017-03-05 MED ORDER — CLOPIDOGREL BISULFATE 75 MG PO TABS
75.0000 mg | ORAL_TABLET | Freq: Every day | ORAL | Status: DC
  Administered 2017-03-06: 75 mg via ORAL
  Filled 2017-03-05: qty 1

## 2017-03-05 MED ORDER — ACETAMINOPHEN 325 MG PO TABS: 650.0000 mg | ORAL_TABLET | ORAL | Status: DC | PRN

## 2017-03-05 MED ORDER — MORPHINE SULFATE (PF) 2 MG/ML IV SOLN: 2.0000 mg | INTRAVENOUS | Status: DC | PRN

## 2017-03-05 MED ORDER — NITROGLYCERIN 0.4 MG/SPRAY TL SOLN
1.0000 | Status: DC | PRN
  Filled 2017-03-05: qty 4.9

## 2017-03-05 MED ORDER — RANOLAZINE ER 500 MG PO TB12
500.0000 mg | ORAL_TABLET | Freq: Two times a day (BID) | ORAL | Status: DC
  Administered 2017-03-05 – 2017-03-06 (×2): 500 mg via ORAL
  Filled 2017-03-05 (×2): qty 1

## 2017-03-05 MED ORDER — OMEGA-3-ACID ETHYL ESTERS 1 G PO CAPS
1.0000 | ORAL_CAPSULE | Freq: Four times a day (QID) | ORAL | Status: DC
  Administered 2017-03-05 – 2017-03-06 (×2): 1 g via ORAL
  Filled 2017-03-05 (×2): qty 1

## 2017-03-05 NOTE — ED Provider Notes (Signed)
Emergency Department Provider Note   I have reviewed the triage vital signs and the nursing notes.   HISTORY  Chief Complaint Chest Pain   HPI Kristina Gibbs is a 71 y.o. female with PMH of CAD s/p CABG, HLD, HTN, and h/o ovarian cancer presents to the ED with chest pain. Her primary cardiologist is Dr. Gwenlyn Found with [facility] Presents to the emergency department for evaluation of worsening chest pain and diaphoresis. She's had several episodes over the last 2 months but today had more persistent, squeezing pain in her chest that was nonradiating. No modifying factors. She took 3 separate nitroglycerin doses and on the third dose the pain subsided. She describes severe diaphoresis but denies dyspnea. She has had multiple heart attacks the past and states this pain feels similar. No medication changes. Last saw her cardiologist a year ago as scheduled.    Past Medical History:  Diagnosis Date  . Allergy   . Arthritis   . Cancer ([place])    ovarian  . Coronary artery disease   . Heart murmur   . Hypercholesteremia   . Hypertension   . 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 Osteopenia   . Stomach problems   . Unstable angina ([place]) 05/27/2014    Patient Active Problem List   Diagnosis Date Noted  . Memory loss 11/17/2015  . Healthcare maintenance 11/06/2015  . Chronic kidney disease (CKD), stage III (moderate) 11/06/2015  . Benign skin lesion of nose 08/04/2015  . CAD (coronary artery disease) of artery bypass graft 06/21/2014  . Abdominal pain of unknown etiology, episodic 06/21/2014  . Intermediate coronary syndrome, stable LIMA-LAD at cath, no other changes continues with VG occlusion.  May be GI source 05/26/2014  . Hypertension 02/22/2014  . Wheeze 01/21/2014  . Cough 01/21/2014  . Seasonal allergic rhinitis 01/21/2014  . Epistaxis 11/12/2013  . Breast lump in upper outer quadrant 04/17/2013  . Memory impairment 04/17/2013  . Pruritus 04/17/2013  . Chest pain 06/05/2012    . HLD (hyperlipidemia) 06/05/2012  . Coronary atherosclerosis 08/31/2010  . HEMORRHOIDS-INTERNAL 08/31/2010  . GERD 08/31/2010  . Constipation 08/31/2010  . RECTAL BLEEDING 08/31/2010    Past Surgical History:  Procedure Laterality Date  . ABDOMINAL HYSTERECTOMY    . CHOLECYSTECTOMY    . CORONARY ARTERY BYPASS GRAFT    . EYE SURGERY     bilateral cataract extraction  . HAND SURGERY Left    injurred hand at work- Programmer, applications  . hysterectomy    . LEFT HEART CATHETERIZATION WITH CORONARY ANGIOGRAM N/A 05/27/2014   Procedure: LEFT HEART CATHETERIZATION WITH CORONARY ANGIOGRAM;  Surgeon: [name], MD;  Location: [facility] CATH LAB;  Service: Cardiovascular;  Laterality: N/A;  . PCTA     x7     Current Outpatient Rx  . Order #: [id number] Class: Normal  . Order #: [id number] Class: Normal  . Order #: [id number] Class: Historical Med  . Order #: [id number] Class: Historical Med  . Order #: [id number] Class: Historical Med  . Order #: [id number] Class: Historical Med  . Order #: [id number] Class: Normal  . Order #: [id number] Class: Historical Med  . Order #: [id number] Class: Normal  . Order #: [id number] Class: Historical Med  . Order #: [id number] Class: Normal  . Order #: [id number] Class: Normal  . Order #: [id number] Class: Normal  . Order #: [id number] Class: Normal  . Order #: [id number] Class: Normal  . Order #: [id number] Class: Normal  . Order #: [id number] Class: Historical Med  . Order #: [id number] Class: Normal  .  Order #: [id number] Class: Historical Med    Allergies Lactose intolerance (gi); Codeine; and Hydrocortisone  Family History  Problem Relation Age of Onset  . Stroke Mother   . Diabetes Mother   . Kidney disease Mother   . Hypertension Mother   . Hyperlipidemia Mother   . Dementia Mother   . Heart disease Daughter        heart attack x2  . Heart disease Brother        2 heart attacks  . Colon cancer Neg Hx   . Stomach cancer Neg Hx   . Esophageal cancer Neg Hx   . Rectal cancer  Neg Hx   . Liver cancer Neg Hx     Social History Social History  Substance Use Topics  . Smoking status: Former Smoker    Packs/day: 0.50    Types: Cigarettes    Quit date: 10/04/2009  . Smokeless tobacco: Never Used  . Alcohol use 0.6 oz/week    1 Glasses of wine per week     Comment: red wine once per month    Review of Systems  Constitutional: No fever/chills Eyes: No visual changes. ENT: No sore throat. Cardiovascular: Positive chest pain. Respiratory: Denies shortness of breath. Gastrointestinal: No abdominal pain.  No nausea, no vomiting.  No diarrhea.  No constipation. Genitourinary: Negative for dysuria. Musculoskeletal: Negative for back pain. Skin: Negative for rash. Neurological: Negative for headaches, focal weakness or numbness.  10-point ROS otherwise negative.  ____________________________________________   PHYSICAL EXAM:  VITAL SIGNS: ED Triage Vitals  Enc Vitals Group     BP 03/05/17 1607 (!) 186/42     Pulse Rate 03/05/17 1607 (!) 54     Resp 03/05/17 1607 17     Temp 03/05/17 1607 98.7 F (37.1 C)     Temp Source 03/05/17 1607 Oral     SpO2 03/05/17 1604 99 %     Weight 03/05/17 1608 164 lb (74.4 kg)     Height 03/05/17 1608 5\' 2"  (1.575 m)     Pain Score 03/05/17 1607 0    Constitutional: Alert and oriented. Well appearing and in no acute distress. Eyes: Conjunctivae are normal.  Head: Atraumatic. Nose: No congestion/rhinnorhea. Mouth/Throat: Mucous membranes are moist.  Oropharynx non-erythematous. Neck: No stridor.   Cardiovascular: Bradycardia. Good peripheral circulation. Grossly normal heart sounds.   Respiratory: Normal respiratory effort.  No retractions. Lungs CTAB. Gastrointestinal: Soft and nontender. No distention.  Musculoskeletal: No lower extremity tenderness nor edema. No gross deformities of extremities. Neurologic:  Normal speech and language. No gross focal neurologic deficits are appreciated.  Skin:  Skin is warm,  dry and intact. No rash noted.  ____________________________________________   LABS (all labs ordered are listed, but only abnormal results are displayed)  Labs Reviewed  BASIC METABOLIC PANEL - Abnormal; Notable for the following:       Result Value   Sodium 134 (*)    Creatinine, Ser 1.10 (*)    GFR calc non Af Amer 49 (*)    GFR calc Af Amer 57 (*)    All other components within normal limits  CBC - Abnormal; Notable for the following:    Hemoglobin 11.5 (*)    HCT 34.5 (*)    All other components within normal limits  I-STAT TROPOININ, ED   ____________________________________________  EKG   EKG Interpretation  Date/Time:  Saturday March 05 2017 16:06:44 EDT Ventricular Rate:  53 PR Interval:    QRS Duration: 104 QT Interval:  431 QTC Calculation: 405 R Axis:   80 Text Interpretation:  Sinus rhythm No STEMI. Similar to prior.  Confirmed by Nanda Quinton [phone]) on 03/05/2017 4:09:01 PM Also confirmed by Nanda Quinton [phone]), editor Verna Czech [phone])  on 03/05/2017 4:30:40 PM       ____________________________________________  RADIOLOGY  Dg Chest 2 View  Result Date: 03/05/2017 CLINICAL DATA:  Diaphoresis in left chest pain. EXAM: CHEST  2 VIEW COMPARISON:  05/26/2014 FINDINGS: Lungs are hyperexpanded. The lungs are clear wiithout focal pneumonia, edema, pneumothorax or pleural effusion. Interstitial markings are diffusely coarsened with chronic features. Cardiopericardial silhouette is at upper limits of normal for size. Patient is status post CABG. Bones are diffusely demineralized. Telemetry leads overlie the chest. IMPRESSION: Hyperexpansion without acute cardiopulmonary findings. Electronically Signed   By: Misty Stanley M.D.   On: 03/05/2017 16:58    ____________________________________________   PROCEDURES  Procedure(s) performed:   Procedures  None ____________________________________________   INITIAL IMPRESSION / ASSESSMENT AND PLAN / ED  COURSE  Pertinent labs & imaging results that were available during my care of the patient were reviewed by me and considered in my medical decision making (see chart for details).  Patient resents to the emergency department for evaluation of chest pain and diaphoresis. She has a significant ACS history including multiple AMIs and CABG with graft occlusion in the past. Last cath was 2010. Last stress test was 2015 and was normal. HEART score 6. No significant changes on EKG. Chest pain free currently. Plan for labs, CXR, and reassessment.   Differential includes all life-threatening causes for chest pain. This includes but is not exclusive to acute coronary syndrome, aortic dissection, pulmonary embolism, cardiac tamponade, community-acquired pneumonia, pericarditis, musculoskeletal chest wall pain, etc.  Discussed patient's case with hosptialist, Dr. Rockne Menghini. Patient and family (if present) updated with plan. Care transferred to Hospitalist service.  I reviewed all nursing notes, vitals, pertinent old records, EKGs, labs, imaging (as available).   ____________________________________________  FINAL CLINICAL IMPRESSION(S) / ED DIAGNOSES  Final diagnoses:  Precordial chest pain     MEDICATIONS GIVEN DURING THIS VISIT:  ASA and Nitro given PTA  NEW OUTPATIENT MEDICATIONS STARTED DURING THIS VISIT:  None   Note:  This document was prepared using Dragon voice recognition software and may include unintentional dictation errors.  Nanda Quinton, MD Emergency Medicine   Malahki Gasaway, [facility], MD 03/05/17 [id number]

## 2017-03-05 NOTE — ED Triage Notes (Signed)
Pt BIB [facility]. Pt was visiting family today and reports she had a episode of diaphoresis and CP and took 2 Nitro sprays. Once in the car heading home pt had another episode of CP and diaphoresis and took 2 sublingual Nitro. Pt also took 324 Asprin. Denies CP upon arrival to facility, reports feeling weak.

## 2017-03-05 NOTE — H&P (Signed)
History and Physical:    Kristina Gibbs   KCL:[id number] DOB: 1945/12/25 DOA: 03/05/2017  Referring MD/provider: Dr. Nanda Quinton PCP: Timmothy Euler, MD  Cardiologist: Dr. Gwenlyn Found  Patient coming from: Home  Chief Complaint: Chest pain  History of Present Illness:   Kristina Gibbs is an 71 y.o. female with a PMH of CAD s/p CABG in 2010, reocclusion of graft at the aorta and atretic LIMA with most recent cardiac cath done 05/2014 which showed a patent LIMA to the LAD, Normal cardiac stress test 2015, who presents to the ED with a chief complaint of chest pain that came on suddenly and was associated with diaphoresis and feeling hot.  Describes pain as a "squeezing sensation". Took a NTG which initially eased the pain off, but the pain came back so she took another NTG with short term relief. She then took 2 squirts of liquid NTG.  Subsequently took 4 ASA and called EMS.  Pain eased off in the ambulance. Felt weak afterward. Reports that she is currently chest pain-free.  ED Course:  The patient's initial workup showed negative troponin, normal WBC, and chest x-ray without acute cardiopulmonary findings. Given her heart score of 6, she was referred for overnight observation to rule out ACS.  ROS:   Review of Systems  Constitutional: Negative for chills and fever.  HENT: Negative.   Eyes: Negative.   Respiratory: Negative for cough and shortness of breath.   Cardiovascular: Positive for chest pain, palpitations and leg swelling.  Gastrointestinal: Positive for constipation. Negative for abdominal pain, blood in stool, diarrhea, heartburn, melena, nausea and vomiting.       Occasional hemorrhoidal bleeding  Genitourinary: Negative.   Musculoskeletal: Positive for back pain.  Skin: Negative.   Neurological: Positive for weakness. Negative for dizziness.  Endo/Heme/Allergies: Bruises/bleeds easily.  Psychiatric/Behavioral: Negative for depression. The patient is not  nervous/anxious.     Past Medical History:   Past Medical History:  Diagnosis Date  . Allergy   . Arthritis   . Cancer ([place])    ovarian  . Coronary artery disease   . Heart murmur   . Hypercholesteremia   . Hypertension   . 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 Osteopenia   . Stomach problems   . Unstable angina ([place]) 05/27/2014    Past Surgical History:   Past Surgical History:  Procedure Laterality Date  . ABDOMINAL HYSTERECTOMY    . CHOLECYSTECTOMY    . CORONARY ARTERY BYPASS GRAFT    . EYE SURGERY     bilateral cataract extraction  . HAND SURGERY Left    injurred hand at work- Programmer, applications  . hysterectomy    . LEFT HEART CATHETERIZATION WITH CORONARY ANGIOGRAM N/A 05/27/2014   Procedure: LEFT HEART CATHETERIZATION WITH CORONARY ANGIOGRAM;  Surgeon: [name], MD;  Location[facility] C[facility] LAB;  Service: Cardiovascular;  Laterality: N/A;  . PCTA     x7     Social History:   Social History   Social History  . Marital status: Married    Spouse name: [name]   . Number of children: 3  . Years of education: 12   Occupational History  . Retired    Social History Main Topics  . Smoking status: Former Smoker    Packs/day: 0.50    Types: Cigarettes    Quit date: 10/04/2009  . Smokeless tobacco: Never Used  . Alcohol use 0.6 oz/week    1 Glasses of  wine per week     Comment: red wine once per month  . Drug use: No  . Sexual activity: Not Currently    Birth control/ protection: Surgical, Post-menopausal   Other Topics Concern  . Not on file   Social History Narrative   Lives w/ husband   Caffeine use: 3 cups coffee/day    Allergies   Lactose intolerance (gi); Codeine; and Hydrocortisone  Family history:   Family History  Problem Relation Age of Onset  . Stroke Mother   . Diabetes Mother   . Kidney disease Mother   . Hypertension Mother   . Hyperlipidemia Mother   . Dementia Mother   . Heart disease Daughter        heart attack x2  . Heart  disease Brother        2 heart attacks  . Colon cancer Neg Hx   . Stomach cancer Neg Hx   . Esophageal cancer Neg Hx   . Rectal cancer Neg Hx   . Liver cancer Neg Hx     Current Medications:   Prior to Admission medications   Medication Sig Start Date End Date Taking? Authorizing Provider  alendronate (FOSAMAX) 70 MG tablet TAKE 1 TABLET BY MOUTH ONCE A WEEK. TAKE WITH A FULL GLASS OF WATER ON AN EMPTY STOMACH. 02/07/17  Yes Timmothy Euler, MD  AMITIZA 24 MCG capsule TAKE 1 CAPSULE TWICE A DAY WITH A MEAL AS NEEDED Patient taking differently: Take 24 mcg by mouth at bedtime 06/09/16  Yes Timmothy Euler, MD  aspirin EC 81 MG tablet Take 81 mg by mouth daily.   Yes [provider]  b complex vitamins tablet Take 1 tablet by mouth daily.    Yes [provider]  calcium carbonate (OS-CAL) 600 MG TABS Take 600 mg by mouth daily.   Yes [provider]  cholecalciferol (VITAMIN D) 1000 UNITS tablet Take 1,000 Units by mouth daily.   Yes [provider]  clopidogrel (PLAVIX) 75 MG tablet TAKE 1 TABLET BY MOUTH DAILY Patient taking differently: Take 75 mg by mouth in the morning 11/29/16  Yes [name], MD  CO ENZYME Q-10 PO Take 1 capsule by mouth daily.    Yes [provider]  CRESTOR 20 MG tablet Take 1 tablet (20 mg total) by mouth daily. 11/10/16  Yes [name], MD  fluticasone (FLONASE) 50 MCG/ACT nasal spray Place 1 spray into both nostrils 2 (two) times daily as needed for allergies.  12/08/16  Yes [provider]  irbesartan (AVAPRO) 75 MG tablet TAKE 1 TABLET (75 MG TOTAL) BY MOUTH AT BEDTIME. 10/18/16  Yes [name], MD  isosorbide mononitrate (IMDUR) 60 MG 24 hr tablet TAKE 1 TABLET BY MOUTH DAILY Patient taking differently: Take 60 mg by mouth in the morning 08/13/16  Yes [name], MD  metoprolol succinate (TOPROL-XL) 25 MG 24 hr tablet TAKE 1/2 TABLET BY MOUTH DAILY Patient taking differently: Take  12.5 mg by mouth in the morning 12/22/16  Yes [name], MD  nitroGLYCERIN (NITROLINGUAL) 0.4 MG/SPRAY spray Place 1 spray under the tongue every 5 (five) minutes as needed for chest pain. 10/25/13  Yes [name], MD  omega-3 acid ethyl esters (LOVAZA) 1 g capsule TAKE 1 CAPSULE BY MOUTH FOUR TIMES DAILY 04/02/16  Yes [name], MD  pantoprazole (PROTONIX) 40 MG tablet TAKE 1 TABLET BY MOUTH DAILY 10/12/16  Yes [name], MD  PATADAY 0.2 % SOLN Place 1 drop into both eyes 2 (two) times daily.  08/22/14  Yes [provider]  RANEXA 500 MG 12 hr tablet TAKE 1 TABLET (500 MG TOTAL) BY MOUTH 2 (TWO) TIMES DAILY. 08/16/16  Yes [name], MD  vitamin C (ASCORBIC ACID) 500 MG tablet Take 1,000 mg by mouth daily.    Yes [provider]    Physical Exam:   Vitals:   03/05/17 1608 03/05/17 1615 03/05/17 1715 03/05/17 1730  BP:  (!) 159/52 (!) 150/58 (!) 137/42  Pulse:  (!) 51 (!) 49 (!) 50  Resp:  13 16 19   Temp:      TempSrc:      SpO2:  98% 99% 98%  Weight: 74.4 kg (164 lb)     Height: 5\' 2"  (1.575 m)        Physical Exam: Blood pressure (!) 137/42, pulse (!) 50, temperature 98.7 F (37.1 C), temperature source Oral, resp. rate 19, height 5\' 2"  (1.575 m), weight 74.4 kg (164 lb), SpO2 98 %. Gen: No acute distress.Appears stated age, well-nourished. Head: Normocephalic, atraumatic. Eyes: Pupils equal, round and reactive to light. Extraocular movements intact.  Sclerae nonicteric. No lid lag. Mouth: Oropharynx reveals Moist mucous membranes. Dentition is good. Neck: Supple, no thyromegaly, no lymphadenopathy, no jugular venous distention. Chest: Lungs are clear to auscultation with good air movement. No rales, rhonchi or wheezes.  CV: Heart sounds are regular with an S1, S2, apical rate in the 50s. No murmurs, rubs, clicks, or gallops.  Abdomen: Soft, nontender, nondistended with normal active bowel sounds. No hepatosplenomegaly or palpable  masses. Extremities: Extremities are without clubbing, or cyanosis. No edema. Pedal pulses 2+.  Skin: Warm and dry. No rashes, lesions or wounds. Neuro: Alert and oriented times 3; grossly nonfocal.  Psych: Insight is good and judgment is appropriate. Mood and affect mildly anxious.   Data Review:    Labs: Basic Metabolic Panel:  Recent Labs Lab 03/05/17 1616  NA 134*  K 4.4  CL 103  CO2 26  GLUCOSE 87  BUN 15  CREATININE 1.10*  CALCIUM 8.9   Liver Function Tests: No results for input(s): AST, ALT, ALKPHOS, BILITOT, PROT, ALBUMIN in the last 168 hours. No results for input(s): LIPASE, AMYLASE in the last 168 hours. No results for input(s): AMMONIA in the last 168 hours. CBC:  Recent Labs Lab 03/05/17 1616  WBC 4.0  HGB 11.5*  HCT 34.5*  MCV 87.6  PLT 180   Cardiac Enzymes: No results for input(s): CKTOTAL, CKMB, CKMBINDEX, TROPONINI in the last 168 hours.  BNP (last 3 results) No results for input(s): PROBNP in the last 8760 hours. CBG: No results for input(s): GLUCAP in the last 168 hours.  Urinalysis    Component Value Date/Time   COLORURINE YELLOW 06/05/2012 1251   APPEARANCEUR Clear 05/13/2016 1001   LABSPEC 1.012 06/05/2012 1251   PHURINE 5.0 06/05/2012 1251   GLUCOSEU Negative 05/13/2016 1001   HGBUR NEGATIVE 06/05/2012 1251   BILIRUBINUR Negative 05/13/2016 1001   KETONESUR NEGATIVE 06/05/2012 1251   PROTEINUR Negative 05/13/2016 1001   PROTEINUR NEGATIVE 06/05/2012 1251   UROBILINOGEN negative 03/25/2015 1212   UROBILINOGEN 0.2 06/05/2012 1251   NITRITE Negative 05/13/2016 1001   NITRITE NEGATIVE 06/05/2012 1251   LEUKOCYTESUR Trace (A) 05/13/2016 1001      Radiographic Studies: Dg Chest 2 View  Result Date: 03/05/2017 CLINICAL DATA:  Diaphoresis in left chest pain. EXAM: CHEST  2 VIEW COMPARISON:  05/26/2014 FINDINGS: Lungs are hyperexpanded. The lungs are clear wiithout focal pneumonia, edema, pneumothorax or pleural effusion.  Interstitial markings are diffusely coarsened with chronic features. Cardiopericardial silhouette is at upper limits of normal for size. Patient is status post CABG. Bones are diffusely demineralized. Telemetry leads overlie the chest. IMPRESSION: Hyperexpansion without acute cardiopulmonary findings. Electronically Signed   By: Misty Stanley M.D.   On: 03/05/2017 16:58    EKG: Independently reviewed. Sinus rhythm at 53 bpm. QTC 405 ms. No ST or T-wave changes. No ischemic changes. Similar to prior.   Assessment/Plan:   Principal Problem:   Chest pain the patient with known coronary atherosclerosis and CAD of artery bypass graft Heart score is 6. Screening troponin and EKG are both reassuring. Refer to overnight observation on telemetry. Cycle cardiac markers. Continue aspirin, Plavix, statin, Imdur, Ranexa and beta blocker.  Active Problems:   GERD Continue Protonix. GI cocktail when necessary.     Chronic constipation Continue amitiza, add MiraLAX PRN.    HLD (hyperlipidemia) Continue Crestor and Lovaza.    Hypertension Continue home antihypertensives: Avapro, metoprolol, Imdur, Ranexa.    Obesity Body mass index is 30 kg/m.     Stage III chronic kidney disease Baseline creatinine 1.0-1.1. Creatinine stable.  Other information:   DVT prophylaxis: Lovenox ordered. Code Status: Full code. Family Communication: Husband at the bedside. Disposition Plan: Telemetry. Consults called: Nonurgent cardiology consult placed in [place]. Admission status: Observation.  The medical decision making on this patient was of high complexity and the patient is at high risk for clinical deterioration, therefore this is a level 3 visit.   RAMA,CHRISTINA [facility] Pager [phone] Cell: [phone]   If 7PM-7AM, please contact night-coverage [email or web address] Password [email or web address] 03/05/2017, 6:20 PM

## 2017-03-05 NOTE — ED Notes (Signed)
ED Provider at bedside. 

## 2017-03-05 NOTE — ED Notes (Addendum)
Gave report at 630 will take pt up at 730.

## 2017-03-05 NOTE — ED Notes (Signed)
Patient transported to X-ray 

## 2017-03-06 ENCOUNTER — Observation Stay (HOSPITAL_BASED_OUTPATIENT_CLINIC_OR_DEPARTMENT_OTHER): Payer: Medicare HMO

## 2017-03-06 DIAGNOSIS — I129 Hypertensive chronic kidney disease with stage 1 through stage 4 chronic kidney disease, or unspecified chronic kidney disease: Secondary | ICD-10-CM | POA: Diagnosis not present

## 2017-03-06 DIAGNOSIS — I25708 Atherosclerosis of coronary artery bypass graft(s), unspecified, with other forms of angina pectoris: Secondary | ICD-10-CM | POA: Diagnosis not present

## 2017-03-06 DIAGNOSIS — Z951 Presence of aortocoronary bypass graft: Secondary | ICD-10-CM | POA: Diagnosis not present

## 2017-03-06 DIAGNOSIS — R072 Precordial pain: Secondary | ICD-10-CM | POA: Diagnosis not present

## 2017-03-06 DIAGNOSIS — Z9861 Coronary angioplasty status: Secondary | ICD-10-CM | POA: Diagnosis not present

## 2017-03-06 DIAGNOSIS — Z7982 Long term (current) use of aspirin: Secondary | ICD-10-CM | POA: Diagnosis not present

## 2017-03-06 DIAGNOSIS — E782 Mixed hyperlipidemia: Secondary | ICD-10-CM | POA: Diagnosis not present

## 2017-03-06 DIAGNOSIS — I2510 Atherosclerotic heart disease of native coronary artery without angina pectoris: Secondary | ICD-10-CM

## 2017-03-06 DIAGNOSIS — I10 Essential (primary) hypertension: Secondary | ICD-10-CM | POA: Diagnosis not present

## 2017-03-06 DIAGNOSIS — Z87891 Personal history of nicotine dependence: Secondary | ICD-10-CM | POA: Diagnosis not present

## 2017-03-06 DIAGNOSIS — R079 Chest pain, unspecified: Secondary | ICD-10-CM

## 2017-03-06 DIAGNOSIS — I209 Angina pectoris, unspecified: Secondary | ICD-10-CM | POA: Diagnosis not present

## 2017-03-06 DIAGNOSIS — Z79899 Other long term (current) drug therapy: Secondary | ICD-10-CM | POA: Diagnosis not present

## 2017-03-06 DIAGNOSIS — Z8543 Personal history of malignant neoplasm of ovary: Secondary | ICD-10-CM | POA: Diagnosis not present

## 2017-03-06 DIAGNOSIS — I25119 Atherosclerotic heart disease of native coronary artery with unspecified angina pectoris: Secondary | ICD-10-CM | POA: Diagnosis not present

## 2017-03-06 DIAGNOSIS — N183 Chronic kidney disease, stage 3 (moderate): Secondary | ICD-10-CM | POA: Diagnosis not present

## 2017-03-06 LAB — GLUCOSE, CAPILLARY: GLUCOSE-CAPILLARY: 89 mg/dL (ref 65–99)

## 2017-03-06 LAB — NM MYOCAR MULTI W/SPECT W/WALL MOTION / EF: Rest HR: 68 {beats}/min

## 2017-03-06 LAB — TROPONIN I

## 2017-03-06 MED ORDER — REGADENOSON 0.4 MG/5ML IV SOLN
0.4000 mg | Freq: Once | INTRAVENOUS | Status: AC
  Administered 2017-03-06: 0.4 mg via INTRAVENOUS
  Filled 2017-03-06: qty 5

## 2017-03-06 MED ORDER — TECHNETIUM TC 99M TETROFOSMIN IV KIT
30.0000 | PACK | Freq: Once | INTRAVENOUS | Status: AC | PRN
  Administered 2017-03-06: 30 via INTRAVENOUS

## 2017-03-06 MED ORDER — TECHNETIUM TC 99M TETROFOSMIN IV KIT
10.0000 | PACK | Freq: Once | INTRAVENOUS | Status: AC | PRN
  Administered 2017-03-06: 10 via INTRAVENOUS

## 2017-03-06 MED ORDER — REGADENOSON 0.4 MG/5ML IV SOLN
INTRAVENOUS | Status: AC
  Filled 2017-03-06: qty 5

## 2017-03-06 NOTE — Plan of Care (Signed)
Problem: Pain Managment: Goal: General experience of comfort will improve Outcome: Progressing Pt denies any Chest pain

## 2017-03-06 NOTE — Consult Note (Signed)
Cardiology Consultation:   Patient ID: Kristina Gibbs; [id number]; 17-Aug-1946   Admit date: 03/05/2017 Date of Consult: 03/06/2017  Primary Care Provider: Timmothy Euler, MD Primary Cardiologist: Dr Gwenlyn Found    Patient Profile:   Kristina Gibbs is a 71 y.o. female with a hx of CAD who is being seen today for the evaluation of chest pain at the request of Dr Clementeen Graham  History of Present Illness:   Kristina Gibbs is a70 y/o female with a history of remote MI and PCI in 1995. In 2000 she had CABG x 3 with an LIMA-LAD, SVG-OM1, and SVG to RCA. Cath in 2010 showed a widley patent LIMA-LAD, and occluded SVGs. The native RCA was patent with a patent mRCA stent. The OM1 had a mid 75% stenosis. Her LVF was normal. Myoview in 2015 was low risk. She had cath in 2015 secondary to chest pain and this was unchanged from 2010. She has done well since on medical Rx till yesterday.   She and her husband went to visit it her husband's sister, apparently this can be stressful for the pt. While visiting she developed SSCP, pressure. This was preceded by diaphoresis. The SL NTG helped but her pain came back on the ride home, again preceded by diaphoresis, again eventually relieved with NTG but she decided to come to the ED to be evaluated. She has had no pain since admission and her Toponin and EKG are normal.   Past Medical History:  Diagnosis Date  . Allergy   . Arthritis   . Cancer ([place])    ovarian  . Coronary artery disease   . Heart murmur   . Hypercholesteremia   . Hypertension   . 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 Osteopenia   . Stomach problems   . Unstable angina ([place]) 05/27/2014    Past Surgical History:  Procedure Laterality Date  . ABDOMINAL HYSTERECTOMY    . CHOLECYSTECTOMY    . CORONARY ARTERY BYPASS GRAFT    . EYE SURGERY     bilateral cataract extraction  . HAND SURGERY Left    injurred hand at work- Programmer, applications  . hysterectomy    . LEFT HEART  CATHETERIZATION WITH CORONARY ANGIOGRAM N/A 05/27/2014   Procedure: LEFT HEART CATHETERIZATION WITH CORONARY ANGIOGRAM;  Surgeon: [name], MD;  Location: [facility] CATH LAB;  Service: Cardiovascular;  Laterality: N/A;  . PCTA     x7      Inpatient Medications: Scheduled Meds: . aspirin EC  81 mg Oral Daily  . B-complex with vitamin C  1 tablet Oral Daily  . calcium carbonate  625 mg Oral Q breakfast  . cholecalciferol  1,000 Units Oral Daily  . clopidogrel  75 mg Oral Daily  . enoxaparin (LOVENOX) injection  40 mg Subcutaneous Q24H  . irbesartan  75 mg Oral QHS  . isosorbide mononitrate  60 mg Oral Daily  . lubiprostone  24 mcg Oral QHS  . metoprolol succinate  12.5 mg Oral Daily  . olopatadine  1 drop Both Eyes BID  . omega-3 acid ethyl esters  1 capsule Oral QID  . pantoprazole  40 mg Oral Daily  . ranolazine  500 mg Oral BID  . rosuvastatin  20 mg Oral Daily  . vitamin C  1,000 mg Oral Daily   Continuous Infusions:  PRN Meds: acetaminophen, ALPRAZolam, gi cocktail, morphine injection, nitroGLYCERIN, ondansetron (ZOFRAN) IV  Allergies:    Allergies  Allergen Reactions  .  Lactose Intolerance (Gi) Nausea And Vomiting and Nausea Only  . Codeine Nausea And Vomiting  . Hydrocortisone Itching    Social History:   Social History   Social History  . Marital status: Married    Spouse name: [name]   . Number of children: 3  . Years of education: 12   Occupational History  . Retired    Social History Main Topics  . Smoking status: Former Smoker    Packs/day: 0.50    Types: Cigarettes    Quit date: 10/04/2009  . Smokeless tobacco: Never Used  . Alcohol use 0.6 oz/week    1 Glasses of wine per week     Comment: red wine once per month  . Drug use: No  . Sexual activity: Not Currently    Birth control/ protection: Surgical, Post-menopausal   Other Topics Concern  . Not on file   Social History Narrative   Lives w/ husband   Caffeine use: 3 cups coffee/day      Family History:   The patient's family history includes Dementia in her mother; Diabetes in her mother; Heart disease in her brother and daughter; Hyperlipidemia in her mother; Hypertension in her mother; Kidney disease in her mother; Stroke in her mother. There is no history of Colon cancer, Stomach cancer, Esophageal cancer, Rectal cancer, or Liver cancer.  ROS:  Please see the history of present illness.  ROS  All other ROS reviewed and negative.     Physical Exam/Data:   Vitals:   03/05/17 1815 03/05/17 1825 03/05/17 2000 03/06/17 0445  BP: (!) 140/42 (!) 140/42 (!) 178/54 (!) 123/37  Pulse: (!) 48 (!) 49 (!) 52 (!) 52  Resp: 18 17 18 18   Temp:   98.5 F (36.9 C) 97.9 F (36.6 C)  TempSrc:   Oral Oral  SpO2: 95% 96% 100% 96%  Weight:   170 lb 8 oz (77.3 kg) 172 lb 4.8 oz (78.2 kg)  Height:   5\' 2"  (1.575 m)     Intake/Output Summary (Last 24 hours) at 03/06/17 0855 Last data filed at 03/05/17 2021  Gross per 24 hour  Intake              120 ml  Output                0 ml  Net              120 ml   Filed Weights   03/05/17 1608 03/05/17 2000 03/06/17 0445  Weight: 164 lb (74.4 kg) 170 lb 8 oz (77.3 kg) 172 lb 4.8 oz (78.2 kg)   Body mass index is 31.51 kg/m.  General:  Well nourished, well developed, in no acute distress HEENT: normal Lymph: no adenopathy Neck: no JVD, soft, short LCA bruit Endocrine:  No thryomegaly Vascular: No carotid bruits; FA pulses 2+ bilaterally without bruits  Cardiac:  normal S1, S2; RRR; no murmur  Lungs:  clear to auscultation bilaterally, no wheezing, rhonchi or rales  Abd: soft, nontender, no hepatomegaly  Ext: no edema Musculoskeletal:  No deformities, BUE and BLE strength normal and equal Skin: warm and dry  Neuro:  CNs 2-12 intact, no focal abnormalities noted Psych:  Normal affect    EKG:  The EKG was personally reviewed and demonstrates NSR-HR 52   Laboratory Data:  Chemistry Recent Labs Lab 03/05/17 1616  03/05/17 1951  NA 134*  --   K 4.4  --   CL 103  --  CO2 26  --   GLUCOSE 87  --   BUN 15  --   CREATININE 1.10* 1.20*  CALCIUM 8.9  --   GFRNONAA 49* 44*  GFRAA 57* 51*  ANIONGAP 5  --     No results for input(s): PROT, ALBUMIN, AST, ALT, ALKPHOS, BILITOT in the last 168 hours. Hematology Recent Labs Lab 03/05/17 1616 03/05/17 1951  WBC 4.0 4.5  RBC 3.94 4.09  HGB 11.5* 12.0  HCT 34.5* 36.1  MCV 87.6 88.3  MCH 29.2 29.3  MCHC 33.3 33.2  RDW 12.9 13.0  PLT 180 194   Cardiac Enzymes Recent Labs Lab 03/05/17 1951 03/05/17 2233 03/06/17 0332  TROPONINI <0.03 <0.03 <0.03    Recent Labs Lab 03/05/17 1625  TROPIPOC 0.00    BNPNo results for input(s): BNP, PROBNP in the last 168 hours.  DDimer No results for input(s): DDIMER in the last 168 hours.  Radiology/Studies:  Dg Chest 2 View  Result Date: 03/05/2017 CLINICAL DATA:  Diaphoresis in left chest pain. EXAM: CHEST  2 VIEW COMPARISON:  05/26/2014 FINDINGS: Lungs are hyperexpanded. The lungs are clear wiithout focal pneumonia, edema, pneumothorax or pleural effusion. Interstitial markings are diffusely coarsened with chronic features. Cardiopericardial silhouette is at upper limits of normal for size. Patient is status post CABG. Bones are diffusely demineralized. Telemetry leads overlie the chest. IMPRESSION: Hyperexpansion without acute cardiopulmonary findings. Electronically Signed   By: Misty Stanley M.D.   On: 03/05/2017 16:58    Assessment and Plan:   1. Unstable angina- chest and diaphoresis concerning for progression of CAD-(? RCA)  2. CABG '00- patent LIMA with occluded SVGs but patent native RCA in 2015  3. Essential HTN- Avapro prior to admission. Dyslipidemia- on high dose statin  Plan: Keep NPO will discuss with MD- ? Cath vs Myoview.    Signed, Kerin Ransom, PA-C  03/06/2017 8:55 AM   The patient was seen and examined, and I agree with the history, physical exam, assessment and plan as  documented above, with modifications as noted below. I have also personally reviewed all relevant documentation, old records, labs, and both radiographic and cardiovascular studies. I have also independently interpreted old and new ECG's.  71 yr old woman with aforementioned CV history (CABG, occluded SVG's, stents) admitted with chest pain which was nitroglycerin-responsive yesterday. 2 episodes occurred, one while visiting with sister in law, another while in car. By the time EMS arrived, symptoms had dissipated.  Low risk nuclear stress test in 2015. Echocardiogram 01/23/16 showed normal LV systolic function and regional wall motion, LVEF 55-60%, with grade 2 diastolic dysfunction.  Troponins have been normal. ECG which I personally interpreted showed sinus bradycardia with no acute ischemic abnormalities.  When I evaluated her, she said "I'm ready to go home". She denies chest pain and shortness of breath at present.  However, her husband said that she is minimizing symptoms and that she has had other bouts of chest pain in the past few weeks. One occurred while she was outside playing with her dog on a hot and humid day.  After a lengthy discussion, she has agreed to proceed with stress testing today. Will arrange for a Lexiscan Myoview.   Kate Sable, MD, [facility]  03/06/2017 9:44 AM

## 2017-03-06 NOTE — Discharge Summary (Signed)
Physician Discharge Summary  TERRILYN TYNER WCB:[id number] DOB: 1946/07/19 DOA: 03/05/2017  PCP: Timmothy Euler, MD  Admit date: 03/05/2017 Discharge date: 03/06/2017  Admitted From: Home Disposition:  Home  Recommendations for Outpatient Follow-up:  1. Follow up with PCP in 1-2 weeks   Home Health: None Equipment/Devices: None  Discharge Condition: Fair CODE STATUS: Full code Diet recommendation: Heart Healthy    Discharge Diagnoses:  Principal Problem:   Chest pain at rest   Active Problems:   Hx of CABG   GERD   Constipation   HLD (hyperlipidemia)   Hypertension   CAD (coronary artery disease) of artery bypass graft   CAD S/P percutaneous coronary angioplasty  Brief narrative/history of present illness 71 year old female with history of CAD status post CABG in 2010, reocclusion of graft at the aorta and treated LIMA with most recent cardiac cath in 2015 which showed patent LIMA to the LAD, normal stress test in 2015 who presented to the ED with squeezing chest pain associated with diaphoresis. Took nitroglycerin which improved her pain but had recurrence. Patient reports having similar symptoms in the past 1 week. She took 4 baby aspirin and called EMS. Pain subsided by the time she came to the ED. Patient placed on observation to rule out ACS.   Hospital course Chest pain Patient has both typical and atypical symptoms. Stable on telemetry overnight. Serial troponins and EKG were negative. Continued aspirin, Plavix, statin, Imdur, beta blocker and Ranexa. CNS cardiology this morning and multiple symptoms in the past 1 week a Lexiscan Myoview was done which was a low risk study with EF of 61%. Anterior ischemia mentioned on prior study was negative on current study.  Patient remains pain-free and can be discharged home on her home medications.  Remaining medical issues are stable. Continue oral home medications.  Procedure: Lexiscan Myoview  Consults:  Cardiology  Family communication: Husband at bedside  Discharge Instructions   Allergies as of 03/06/2017      Reactions   Lactose Intolerance (gi) Nausea And Vomiting, Nausea Only   Codeine Nausea And Vomiting   Hydrocortisone Itching      Medication List    TAKE these medications   alendronate 70 MG tablet Commonly known as:  FOSAMAX TAKE 1 TABLET BY MOUTH ONCE A WEEK. TAKE WITH A FULL GLASS OF WATER ON AN EMPTY STOMACH.   AMITIZA 24 MCG capsule Generic drug:  lubiprostone TAKE 1 CAPSULE TWICE A DAY WITH A MEAL AS NEEDED What changed:  See the new instructions.   aspirin EC 81 MG tablet Take 81 mg by mouth daily.   b complex vitamins tablet Take 1 tablet by mouth daily.   calcium carbonate 600 MG Tabs tablet Commonly known as:  OS-CAL Take 600 mg by mouth daily.   cholecalciferol 1000 units tablet Commonly known as:  VITAMIN D Take 1,000 Units by mouth daily.   clopidogrel 75 MG tablet Commonly known as:  PLAVIX TAKE 1 TABLET BY MOUTH DAILY What changed:  See the new instructions.   CO ENZYME Q-10 PO Take 1 capsule by mouth daily.   CRESTOR 20 MG tablet Generic drug:  rosuvastatin Take 1 tablet (20 mg total) by mouth daily.   fluticasone 50 MCG/ACT nasal spray Commonly known as:  FLONASE Place 1 spray into both nostrils 2 (two) times daily as needed for allergies.   irbesartan 75 MG tablet Commonly known as:  AVAPRO TAKE 1 TABLET (75 MG TOTAL) BY MOUTH AT BEDTIME.   isosorbide  mononitrate 60 MG 24 hr tablet Commonly known as:  IMDUR TAKE 1 TABLET BY MOUTH DAILY What changed:  See the new instructions.   metoprolol succinate 25 MG 24 hr tablet Commonly known as:  TOPROL-XL TAKE 1/2 TABLET BY MOUTH DAILY What changed:  See the new instructions.   nitroGLYCERIN 0.4 MG/SPRAY spray Commonly known as:  NITROLINGUAL Place 1 spray under the tongue every 5 (five) minutes as needed for chest pain.   omega-3 acid ethyl esters 1 g capsule Commonly known  as:  LOVAZA TAKE 1 CAPSULE BY MOUTH FOUR TIMES DAILY   pantoprazole 40 MG tablet Commonly known as:  PROTONIX TAKE 1 TABLET BY MOUTH DAILY   PATADAY 0.2 % Soln Generic drug:  Olopatadine HCl Place 1 drop into both eyes 2 (two) times daily.   RANEXA 500 MG 12 hr tablet Generic drug:  ranolazine TAKE 1 TABLET (500 MG TOTAL) BY MOUTH 2 (TWO) TIMES DAILY.   vitamin C 500 MG tablet Commonly known as:  ASCORBIC ACID Take 1,000 mg by mouth daily.      Follow-up Information    Timmothy Euler, MD. Schedule an appointment as soon as possible for a visit in 2 week(s).   Specialty:  Family Medicine Contact information: [address] [place] [address] [phone]          Allergies  Allergen Reactions  . Lactose Intolerance (Gi) Nausea And Vomiting and Nausea Only  . Codeine Nausea And Vomiting  . Hydrocortisone Itching      Procedures/Studies: Dg Chest 2 View  Result Date: 03/05/2017 CLINICAL DATA:  Diaphoresis in left chest pain. EXAM: CHEST  2 VIEW COMPARISON:  05/26/2014 FINDINGS: Lungs are hyperexpanded. The lungs are clear wiithout focal pneumonia, edema, pneumothorax or pleural effusion. Interstitial markings are diffusely coarsened with chronic features. Cardiopericardial silhouette is at upper limits of normal for size. Patient is status post CABG. Bones are diffusely demineralized. Telemetry leads overlie the chest. IMPRESSION: Hyperexpansion without acute cardiopulmonary findings. Electronically Signed   By: Misty Stanley M.D.   On: 03/05/2017 16:58   Nm Myocar Multi W/spect W/wall Motion / Ef  Result Date: 03/06/2017  There was no ST segment deviation noted during stress.  No T wave inversion was noted during stress.  The study is normal.  This is a low risk study.  Nuclear stress EF: 61%.  Low risk stress nuclear study with normal perfusion and normal left ventricular regional and global systolic function. Previous study described anterior ischemia, not  seen on the current study.       Subjective: Denies further chest pain.  Discharge Exam: Vitals:   03/06/17 1231 03/06/17 1404  BP: (!) 123/43 (!) 129/44  Pulse: 62 (!) 55  Resp:  14  Temp:     Vitals:   03/06/17 1227 03/06/17 1229 03/06/17 1231 03/06/17 1404  BP: (!) 156/51 (!) 162/47 (!) 123/43 (!) 129/44  Pulse: 72 73 62 (!) 55  Resp:    14  Temp:      TempSrc:      SpO2:    98%  Weight:      Height:        General: Elderly female not in distress HEENT: Moist mucosa, supple neck Chest: Clear bilaterally  CVS: Normal S1 and S2, no murmurs rubs or gallop GI: Soft, nondistended, nontender Musculoskeletal: Warm, no edema    The results of significant diagnostics from this hospitalization (including imaging, microbiology, ancillary and laboratory) are listed below for reference.  Microbiology: No results found for this or any previous visit (from the past 240 hour(s)).   Labs: BNP (last 3 results) No results for input(s): BNP in the last 8760 hours. Basic Metabolic Panel:  Recent Labs Lab 03/05/17 1616 03/05/17 1951  NA 134*  --   K 4.4  --   CL 103  --   CO2 26  --   GLUCOSE 87  --   BUN 15  --   CREATININE 1.10* 1.20*  CALCIUM 8.9  --    Liver Function Tests: No results for input(s): AST, ALT, ALKPHOS, BILITOT, PROT, ALBUMIN in the last 168 hours. No results for input(s): LIPASE, AMYLASE in the last 168 hours. No results for input(s): AMMONIA in the last 168 hours. CBC:  Recent Labs Lab 03/05/17 1616 03/05/17 1951  WBC 4.0 4.5  HGB 11.5* 12.0  HCT 34.5* 36.1  MCV 87.6 88.3  PLT 180 194   Cardiac Enzymes:  Recent Labs Lab 03/05/17 1951 03/05/17 2233 03/06/17 0332  TROPONINI <0.03 <0.03 <0.03   BNP: Invalid input(s): POCBNP CBG:  Recent Labs Lab 03/06/17 1017  GLUCAP 89   D-Dimer No results for input(s): DDIMER in the last 72 hours. Hgb A1c No results for input(s): HGBA1C in the last 72 hours. Lipid Profile No  results for input(s): CHOL, HDL, LDLCALC, TRIG, CHOLHDL, LDLDIRECT in the last 72 hours. Thyroid function studies No results for input(s): TSH, T4TOTAL, T3FREE, THYROIDAB in the last 72 hours.  Invalid input(s): FREET3 Anemia work up No results for input(s): VITAMINB12, FOLATE, FERRITIN, TIBC, IRON, RETICCTPCT in the last 72 hours. Urinalysis    Component Value Date/Time   COLORURINE YELLOW 06/05/2012 1251   APPEARANCEUR Clear 05/13/2016 1001   LABSPEC 1.012 06/05/2012 1251   PHURINE 5.0 06/05/2012 1251   GLUCOSEU Negative 05/13/2016 1001   HGBUR NEGATIVE 06/05/2012 1251   BILIRUBINUR Negative 05/13/2016 1001   KETONESUR NEGATIVE 06/05/2012 1251   PROTEINUR Negative 05/13/2016 1001   PROTEINUR NEGATIVE 06/05/2012 1251   UROBILINOGEN negative 03/25/2015 1212   UROBILINOGEN 0.2 06/05/2012 1251   NITRITE Negative 05/13/2016 1001   NITRITE NEGATIVE 06/05/2012 1251   LEUKOCYTESUR Trace (A) 05/13/2016 1001   Sepsis Labs Invalid input(s): PROCALCITONIN,  WBC,  LACTICIDVEN Microbiology No results found for this or any previous visit (from the past 240 hour(s)).   Time coordinating discharge: < 30 minutes  SIGNED:   Louellen Molder, MD  [facility] 03/06/2017, 2:38 PM Pager   If 7PM-7AM, please contact night-coverage [email or web address] Password [email or web address]

## 2017-03-06 NOTE — Progress Notes (Signed)
Myoview low risk. She is anxious to go home. I will arrange OP f/u in a few weeks.   Kerin Ransom PA-C 03/06/2017 2:42 PM

## 2017-03-08 ENCOUNTER — Telehealth: Payer: Self-pay | Admitting: *Deleted

## 2017-03-08 NOTE — Telephone Encounter (Addendum)
Call Completed and Appointment Scheduled: Yes, Date: 03/15/17 with Dr Wendi Snipes.   DISCHARGE INFORMATION Date of Discharge:03/06/17  Discharge Facility: [facility]  Principal Discharge Diagnosis: precordial chest pain  Patient and/or caregiver is knowledgeable of his/her condition(s) and treatment: Yes  MEDICATION RECONCILIATION Current medication list reviewed with patient:Yes  Outpatient Encounter Prescriptions as of 03/08/2017  Medication Sig  . alendronate (FOSAMAX) 70 MG tablet TAKE 1 TABLET BY MOUTH ONCE A WEEK. TAKE WITH A FULL GLASS OF WATER ON AN EMPTY STOMACH.  Marland Kitchen AMITIZA 24 MCG capsule TAKE 1 CAPSULE TWICE A DAY WITH A MEAL AS NEEDED (Patient taking differently: Take 24 mcg by mouth at bedtime)  . aspirin EC 81 MG tablet Take 81 mg by mouth daily.  Marland Kitchen b complex vitamins tablet Take 1 tablet by mouth daily.   . calcium carbonate (OS-CAL) 600 MG TABS Take 600 mg by mouth daily.  . cholecalciferol (VITAMIN D) 1000 UNITS tablet Take 1,000 Units by mouth daily.  . clopidogrel (PLAVIX) 75 MG tablet TAKE 1 TABLET BY MOUTH DAILY (Patient taking differently: Take 75 mg by mouth in the morning)  . CO ENZYME Q-10 PO Take 1 capsule by mouth daily.   . CRESTOR 20 MG tablet Take 1 tablet (20 mg total) by mouth daily.  . fluticasone (FLONASE) 50 MCG/ACT nasal spray Place 1 spray into both nostrils 2 (two) times daily as needed for allergies.   Marland Kitchen irbesartan (AVAPRO) 75 MG tablet TAKE 1 TABLET (75 MG TOTAL) BY MOUTH AT BEDTIME.  . isosorbide mononitrate (IMDUR) 60 MG 24 hr tablet TAKE 1 TABLET BY MOUTH DAILY (Patient taking differently: Take 60 mg by mouth in the morning)  . metoprolol succinate (TOPROL-XL) 25 MG 24 hr tablet TAKE 1/2 TABLET BY MOUTH DAILY (Patient taking differently: Take 12.5 mg by mouth in the morning)  . nitroGLYCERIN (NITROLINGUAL) 0.4 MG/SPRAY spray Place 1 spray under the tongue every 5 (five) minutes as needed for chest pain.  Marland Kitchen omega-3 acid ethyl esters (LOVAZA) 1 g capsule TAKE  1 CAPSULE BY MOUTH FOUR TIMES DAILY  . pantoprazole (PROTONIX) 40 MG tablet TAKE 1 TABLET BY MOUTH DAILY  . PATADAY 0.2 % SOLN Place 1 drop into both eyes 2 (two) times daily.   Marland Kitchen RANEXA 500 MG 12 hr tablet TAKE 1 TABLET (500 MG TOTAL) BY MOUTH 2 (TWO) TIMES DAILY.  . vitamin C (ASCORBIC ACID) 500 MG tablet Take 1,000 mg by mouth daily.    No facility-administered encounter medications on file as of 03/08/2017.     Discharge Medications reviewed and reconciled with current medications.yes  Patient is able to obtain needed medications:Yes  ACTIVITIES OF DAILY LIVING  Is the patient able to perform his/her own ADLs: Yes.    Patient is receiving home health services: No.  PATIENT EDUCATION Questions/Concerns Discussed:

## 2017-03-10 ENCOUNTER — Telehealth: Payer: Self-pay | Admitting: Cardiovascular Disease

## 2017-03-10 ENCOUNTER — Telehealth: Payer: Self-pay | Admitting: *Deleted

## 2017-03-10 NOTE — Telephone Encounter (Signed)
Closed encounter °

## 2017-03-15 ENCOUNTER — Ambulatory Visit (INDEPENDENT_AMBULATORY_CARE_PROVIDER_SITE_OTHER): Payer: Medicare HMO | Admitting: Family Medicine

## 2017-03-15 ENCOUNTER — Encounter: Payer: Self-pay | Admitting: Family Medicine

## 2017-03-15 VITALS — BP 139/54 | HR 56 | Temp 97.3°F | Ht 62.0 in | Wt 168.4 lb

## 2017-03-15 DIAGNOSIS — R079 Chest pain, unspecified: Secondary | ICD-10-CM

## 2017-03-15 NOTE — Patient Instructions (Signed)
Great to see you!  Come back in 1-2 months for follow up, we will re-check your cholesterol then.

## 2017-03-15 NOTE — Progress Notes (Signed)
   HPI  Patient presents today here for hospital follow-up.  Patient was admitted the hospital with chest pain which was ruled out for ACS. She was evaluated with serial troponins, EKG, and a stress test with Myoview.  She denies any additional chest pain in a similar way, she does however have chest wall pain with wearing her bra. She states that improves never she unlatches it.,   She notes that she had some mild fatigue for a few days after leaving the hospital, she is feeling better from that perspective but has had a little bit more shortness of breath with walking in the heat. She still walking the dog early morning and late at night. She slowly getting better.  Her appetite is good.    PMH: Smoking status noted ROS: Per HPI  Objective: BP (!) 139/54   Pulse (!) 56   Temp 97.3 F (36.3 C) (Oral)   Ht 5\' 2"  (1.575 m)   Wt 168 lb 6.4 oz (76.4 kg)   BMI 30.80 kg/m  Gen: NAD, alert, cooperative with exam HEENT: NCAT CV: RRR, good S1/S2, no murmur Resp: CTABL, no wheezes, non-labored Ext: No edema, warm Neuro: Alert and oriented, No gross deficits  Assessment and plan:  # Chest pain Some chest wall pain with wearing a bra, however ACS has been ruled out and she is feeling much better. No recurrent pain consistent with that which took her to the hospital. Red flags for chest pain reviewed with history of CABG. Expected weakness after leaving the hospital, encouraged continue ambulation, healthy diet, and rest when needed.     Laroy Apple, MD [facility] 03/15/2017, 9:13 AM

## 2017-03-22 ENCOUNTER — Other Ambulatory Visit: Payer: Self-pay | Admitting: Dermatology

## 2017-03-22 DIAGNOSIS — L570 Actinic keratosis: Secondary | ICD-10-CM | POA: Diagnosis not present

## 2017-03-22 DIAGNOSIS — L821 Other seborrheic keratosis: Secondary | ICD-10-CM | POA: Diagnosis not present

## 2017-03-22 DIAGNOSIS — D492 Neoplasm of unspecified behavior of bone, soft tissue, and skin: Secondary | ICD-10-CM | POA: Diagnosis not present

## 2017-03-22 DIAGNOSIS — L820 Inflamed seborrheic keratosis: Secondary | ICD-10-CM | POA: Diagnosis not present

## 2017-03-22 DIAGNOSIS — D229 Melanocytic nevi, unspecified: Secondary | ICD-10-CM | POA: Diagnosis not present

## 2017-03-23 ENCOUNTER — Ambulatory Visit: Payer: Medicare HMO | Admitting: Family Medicine

## 2017-03-30 ENCOUNTER — Other Ambulatory Visit: Payer: Self-pay | Admitting: Cardiovascular Disease

## 2017-04-03 ENCOUNTER — Other Ambulatory Visit: Payer: Self-pay | Admitting: Cardiovascular Disease

## 2017-04-12 ENCOUNTER — Ambulatory Visit (INDEPENDENT_AMBULATORY_CARE_PROVIDER_SITE_OTHER): Payer: Medicare HMO | Admitting: Cardiovascular Disease

## 2017-04-12 ENCOUNTER — Encounter: Payer: Self-pay | Admitting: Cardiovascular Disease

## 2017-04-12 DIAGNOSIS — Z951 Presence of aortocoronary bypass graft: Secondary | ICD-10-CM

## 2017-04-12 DIAGNOSIS — I10 Essential (primary) hypertension: Secondary | ICD-10-CM

## 2017-04-12 DIAGNOSIS — E7800 Pure hypercholesterolemia, unspecified: Secondary | ICD-10-CM | POA: Diagnosis not present

## 2017-04-12 NOTE — Assessment & Plan Note (Signed)
History of essential hypertension blood pressure measures 138/52. She is on Avapro and metoprolol. Continue current meds at current dosing

## 2017-04-12 NOTE — Progress Notes (Signed)
04/12/2017 Kristina Gibbs   10-06-1945  [id number]  Primary Physician Kristina Euler, MD Primary Cardiologist: Kristina Harp MD Kristina Gibbs  HPI:  [name] is a 71 year old mildly overweight Caucasian female with a history of CAD previously taken care of by Kristina Gibbs. I last saw her in the office   03/12/16. She has a history of anterior wall myocardial infarction in 1995 with subsequent percutaneous revascularization to her RCA and LAD. She ultimately requiredmultivessel bypass surgery by Kristina Gibbs March of 2000 and LIMA to LAD, vein to a marginal branch of the circumflex and distal right coronary artery. Problems included hypertension and hyperlipidemia. I performed cardiac catheterization on her in 2010 revealing occluded vein grafts, patent LIMA and normal LV function. Since I saw her in the office year ago she's remained medically stable with only a few episodes of nitrate responsive chest pain. She was recently hospitalized overnight for chest pain 03/05/17. She ruled out for myocardial infarction. She was seen in consultation by Kristina Gibbs  recommended a Myoview stress test that was performed. This showed no ischemic ischemia. She's had no recurrent chest pain.  Current Outpatient Prescriptions  Medication Sig Dispense Refill  . alendronate (FOSAMAX) 70 MG tablet TAKE 1 TABLET BY MOUTH ONCE A WEEK. TAKE WITH A FULL GLASS OF WATER ON AN EMPTY STOMACH. 12 tablet 0  . AMITIZA 24 MCG capsule TAKE 1 CAPSULE TWICE A DAY WITH A MEAL AS NEEDED (Patient taking differently: Take 24 mcg by mouth at bedtime) 60 capsule 2  . aspirin EC 81 MG tablet Take 81 mg by mouth daily.    Marland Kitchen b complex vitamins tablet Take 1 tablet by mouth daily.     . calcium carbonate (OS-CAL) 600 MG TABS Take 600 mg by mouth daily.    . cholecalciferol (VITAMIN D) 1000 UNITS tablet Take 1,000 Units by mouth daily.    . clopidogrel (PLAVIX) 75 MG tablet TAKE 1 TABLET BY MOUTH DAILY  (Patient taking differently: Take 75 mg by mouth in the morning) 30 tablet 4  . CO ENZYME Q-10 PO Take 1 capsule by mouth daily.     . CRESTOR 20 MG tablet Take 1 tablet (20 mg total) by mouth daily. 90 tablet 1  . irbesartan (AVAPRO) 75 MG tablet TAKE 1 TABLET (75 MG TOTAL) BY MOUTH AT BEDTIME. 30 tablet 5  . isosorbide mononitrate (IMDUR) 60 MG 24 hr tablet TAKE 1 TABLET BY MOUTH DAILY (Patient taking differently: Take 60 mg by mouth in the morning) 30 tablet 10  . metoprolol succinate (TOPROL-XL) 25 MG 24 hr tablet TAKE 1/2 TABLET BY MOUTH DAILY (Patient taking differently: Take 12.5 mg by mouth in the morning) 30 tablet 3  . nitroGLYCERIN (NITROLINGUAL) 0.4 MG/SPRAY spray Place 1 spray under the tongue every 5 (five) minutes as needed for chest pain. 12 g 2  . omega-3 acid ethyl esters (LOVAZA) 1 g capsule TAKE 1 CAPSULE BY MOUTH FOUR TIMES DAILY 120 capsule 0  . pantoprazole (PROTONIX) 40 MG tablet TAKE 1 TABLET BY MOUTH DAILY 30 tablet 0  . PATADAY 0.2 % SOLN Place 1 drop into both eyes 2 (two) times daily.   6  . RANEXA 500 MG 12 hr tablet TAKE 1 TABLET (500 MG TOTAL) BY MOUTH 2 (TWO) TIMES DAILY. 60 tablet 7  . vitamin C (ASCORBIC ACID) 500 MG tablet Take 1,000 mg by mouth daily.      No current facility-administered medications for  this visit.     Allergies  Allergen Reactions  . Lactose Intolerance (Gi) Nausea And Vomiting and Nausea Only  . Codeine Nausea And Vomiting  . Hydrocortisone Itching    Social History   Social History  . Marital status: Married    Spouse name: Kristina Gibbs   . Number of children: 3  . Years of education: 12   Occupational History  . Retired    Social History Main Topics  . Smoking status: Former Smoker    Packs/day: 0.50    Types: Cigarettes    Quit date: 10/04/2009  . Smokeless tobacco: Never Used  . Alcohol use 0.6 oz/week    1 Glasses of wine per week     Comment: red wine once per month  . Drug use: No  . Sexual activity: Not Currently     Birth control/ protection: Surgical, Post-menopausal   Other Topics Concern  . Not on file   Social History Narrative   Lives w/ husband   Caffeine use: 3 cups coffee/day     Review of Systems: General: negative for chills, fever, night sweats or weight changes.  Cardiovascular: negative for chest pain, dyspnea on exertion, edema, orthopnea, palpitations, paroxysmal nocturnal dyspnea or shortness of breath Dermatological: negative for rash Respiratory: negative for cough or wheezing Urologic: negative for hematuria Abdominal: negative for nausea, vomiting, diarrhea, bright red blood per rectum, melena, or hematemesis Neurologic: negative for visual changes, syncope, or dizziness All other systems reviewed and are otherwise negative except as noted above.    Blood pressure (!) 138/52, pulse (!) 54, height 5\' 2"  (1.575 m), weight 169 lb (76.7 kg).  General appearance: alert and no distress Neck: no adenopathy, no carotid bruit, no JVD, supple, symmetrical, trachea midline and thyroid not enlarged, symmetric, no tenderness/mass/nodules Lungs: clear to auscultation bilaterally Heart: regular rate and rhythm, S1, S2 normal, no murmur, click, rub or gallop Extremities: extremities normal, atraumatic, no cyanosis or edema  EKG not performed today  ASSESSMENT AND PLAN:   Hx of CABG History of coronary artery bypass grafting by Kristina Gibbs March 2000 with a LIMA to LAD, vein to marginal branch the circumflex and distal right coronary artery. She does have a history of an anterior myocardial infarction back in 1995 with subsequent PCI of her RCA and LAD. I catheterized her in 2010 revealing occluded vein grafts a patent LIMA and normal LV function. Resume year ago she was having occasional nitrate responsive angina. She was recently hospitalized overnight for chest pain 03/05/17 and ruled out for myocardial infarction. A Myoview stress test was nonischemic at low risk and she has had no  recurrent symptoms.  HLD (hyperlipidemia) History of hyperlipidemia on statin therapy followed by her PCP  Hypertension History of essential hypertension blood pressure measures 138/52. She is on Avapro and metoprolol. Continue current meds at current dosing      Kristina Harp MD [facility] 04/12/2017 3:01 PM

## 2017-04-12 NOTE — Assessment & Plan Note (Signed)
History of hyperlipidemia on statin therapy followed by her PCP. 

## 2017-04-12 NOTE — Assessment & Plan Note (Signed)
History of coronary artery bypass grafting by Dr. Roxan Hockey March 2000 with a LIMA to LAD, vein to marginal branch the circumflex and distal right coronary artery. She does have a history of an anterior myocardial infarction back in 1995 with subsequent PCI of her RCA and LAD. I catheterized her in 2010 revealing occluded vein grafts a patent LIMA and normal LV function. Resume year ago she was having occasional nitrate responsive angina. She was recently hospitalized overnight for chest pain 03/05/17 and ruled out for myocardial infarction. A Myoview stress test was nonischemic at low risk and she has had no recurrent symptoms.

## 2017-04-12 NOTE — Patient Instructions (Signed)

## 2017-04-14 ENCOUNTER — Other Ambulatory Visit: Payer: Self-pay | Admitting: Cardiovascular Disease

## 2017-04-14 MED ORDER — NITROGLYCERIN 0.4 MG/SPRAY TL SOLN: 1.0000 | 3 refills | Status: DC | PRN

## 2017-04-14 NOTE — Telephone Encounter (Signed)
New message     *STAT* If patient is at the pharmacy, call can be transferred to refill team.   1. Which medications need to be refilled? (please list name of each medication and dose if known) nitroglycerin spray   2. Which pharmacy/location (including street and city if local pharmacy) is medication to be sent to? [facility] [place]  3. Do they need a 30 day or 90 day supply? 30 day

## 2017-04-14 NOTE — Telephone Encounter (Signed)
Rx request sent to pharmacy.  

## 2017-04-18 ENCOUNTER — Other Ambulatory Visit: Payer: Self-pay | Admitting: Cardiovascular Disease

## 2017-04-18 NOTE — Telephone Encounter (Signed)
Rx has been sent to the pharmacy electronically. ° °

## 2017-04-19 ENCOUNTER — Other Ambulatory Visit: Payer: Self-pay | Admitting: Family Medicine

## 2017-04-19 ENCOUNTER — Other Ambulatory Visit: Payer: Medicare HMO

## 2017-04-19 DIAGNOSIS — E7800 Pure hypercholesterolemia, unspecified: Secondary | ICD-10-CM

## 2017-04-19 DIAGNOSIS — I25708 Atherosclerosis of coronary artery bypass graft(s), unspecified, with other forms of angina pectoris: Secondary | ICD-10-CM

## 2017-04-19 DIAGNOSIS — E875 Hyperkalemia: Secondary | ICD-10-CM

## 2017-04-20 LAB — CMP14+EGFR
ALK PHOS: 49 IU/L (ref 39–117)
ALT: 21 IU/L (ref 0–32)
AST: 19 IU/L (ref 0–40)
Albumin/Globulin Ratio: 1.6 (ref 1.2–2.2)
Albumin: 4.1 g/dL (ref 3.5–4.8)
BUN/Creatinine Ratio: 15 (ref 12–28)
BUN: 17 mg/dL (ref 8–27)
Bilirubin Total: 0.6 mg/dL (ref 0.0–1.2)
CHLORIDE: 100 mmol/L (ref 96–106)
CO2: 24 mmol/L (ref 20–29)
Calcium: 9.4 mg/dL (ref 8.7–10.3)
Creatinine, Ser: 1.14 mg/dL — ABNORMAL HIGH (ref 0.57–1.00)
GFR calc non Af Amer: 48 mL/min/{1.73_m2} — ABNORMAL LOW (ref >59)
GFR, EST AFRICAN AMERICAN: 56 mL/min/{1.73_m2} — AB (ref >59)
GLUCOSE: 100 mg/dL — AB (ref 65–99)
Globulin, Total: 2.6 g/dL (ref 1.5–4.5)
Potassium: 5.6 mmol/L — ABNORMAL HIGH (ref 3.5–5.2)
Sodium: 140 mmol/L (ref 134–144)
TOTAL PROTEIN: 6.7 g/dL (ref 6.0–8.5)

## 2017-04-20 LAB — CBC WITH DIFFERENTIAL/PLATELET
BASOS ABS: 0 10*3/uL (ref 0.0–0.2)
Basos: 1 %
EOS (ABSOLUTE): 0.2 10*3/uL (ref 0.0–0.4)
Eos: 4 %
Hematocrit: 37.9 % (ref 34.0–46.6)
Hemoglobin: 12.1 g/dL (ref 11.1–15.9)
IMMATURE GRANS (ABS): 0 10*3/uL (ref 0.0–0.1)
Immature Granulocytes: 0 %
LYMPHS: 27 %
Lymphocytes Absolute: 1.1 10*3/uL (ref 0.7–3.1)
MCH: 29.5 pg (ref 26.6–33.0)
MCHC: 31.9 g/dL (ref 31.5–35.7)
MCV: 92 fL (ref 79–97)
Monocytes Absolute: 0.3 10*3/uL (ref 0.1–0.9)
Monocytes: 7 %
NEUTROS PCT: 61 %
Neutrophils Absolute: 2.4 10*3/uL (ref 1.4–7.0)
PLATELETS: 201 10*3/uL (ref 150–379)
RBC: 4.1 x10E6/uL (ref 3.77–5.28)
RDW: 13.4 % (ref 12.3–15.4)
WBC: 4.1 10*3/uL (ref 3.4–10.8)

## 2017-04-20 LAB — LIPID PANEL
CHOLESTEROL TOTAL: 155 mg/dL (ref 100–199)
Chol/HDL Ratio: 2 ratio (ref 0.0–4.4)
HDL: 77 mg/dL (ref >39)
LDL Calculated: 67 mg/dL (ref 0–99)
TRIGLYCERIDES: 53 mg/dL (ref 0–149)
VLDL CHOLESTEROL CAL: 11 mg/dL (ref 5–40)

## 2017-04-20 LAB — TSH: TSH: 3.42 u[IU]/mL (ref 0.450–4.500)

## 2017-04-26 ENCOUNTER — Other Ambulatory Visit: Payer: Self-pay | Admitting: Cardiovascular Disease

## 2017-04-28 ENCOUNTER — Ambulatory Visit: Payer: Medicare HMO | Admitting: Family Medicine

## 2017-04-29 NOTE — Progress Notes (Unsigned)
Lab placed- patient aware and verbalizes understanding.

## 2017-04-30 ENCOUNTER — Other Ambulatory Visit: Payer: Self-pay | Admitting: Cardiovascular Disease

## 2017-05-02 ENCOUNTER — Encounter: Payer: Self-pay | Admitting: Family Medicine

## 2017-05-02 NOTE — Telephone Encounter (Signed)
Rx has been sent to the pharmacy electronically. ° °

## 2017-05-04 ENCOUNTER — Other Ambulatory Visit: Payer: Medicare HMO

## 2017-05-04 DIAGNOSIS — E875 Hyperkalemia: Secondary | ICD-10-CM | POA: Diagnosis not present

## 2017-05-05 ENCOUNTER — Other Ambulatory Visit: Payer: Self-pay | Admitting: Family Medicine

## 2017-05-05 LAB — BMP8+EGFR
BUN/Creatinine Ratio: 13 (ref 12–28)
BUN: 15 mg/dL (ref 8–27)
CALCIUM: 9.3 mg/dL (ref 8.7–10.3)
CO2: 26 mmol/L (ref 20–29)
CREATININE: 1.17 mg/dL — AB (ref 0.57–1.00)
Chloride: 101 mmol/L (ref 96–106)
GFR, EST AFRICAN AMERICAN: 54 mL/min/{1.73_m2} — AB (ref >59)
GFR, EST NON AFRICAN AMERICAN: 47 mL/min/{1.73_m2} — AB (ref >59)
Glucose: 105 mg/dL — ABNORMAL HIGH (ref 65–99)
Potassium: 5.6 mmol/L — ABNORMAL HIGH (ref 3.5–5.2)
Sodium: 140 mmol/L (ref 134–144)

## 2017-05-06 ENCOUNTER — Other Ambulatory Visit: Payer: Self-pay | Admitting: *Deleted

## 2017-05-06 DIAGNOSIS — N183 Chronic kidney disease, stage 3 unspecified: Secondary | ICD-10-CM

## 2017-05-10 ENCOUNTER — Other Ambulatory Visit: Payer: Self-pay | Admitting: Cardiovascular Disease

## 2017-05-11 ENCOUNTER — Other Ambulatory Visit: Payer: Self-pay | Admitting: Cardiovascular Disease

## 2017-05-12 ENCOUNTER — Ambulatory Visit (INDEPENDENT_AMBULATORY_CARE_PROVIDER_SITE_OTHER): Payer: Medicare HMO | Admitting: Family Medicine

## 2017-05-12 ENCOUNTER — Encounter: Payer: Self-pay | Admitting: Family Medicine

## 2017-05-12 VITALS — BP 123/50 | HR 54 | Temp 97.1°F | Ht 62.0 in | Wt 167.8 lb

## 2017-05-12 DIAGNOSIS — G8929 Other chronic pain: Secondary | ICD-10-CM

## 2017-05-12 DIAGNOSIS — M545 Low back pain, unspecified: Secondary | ICD-10-CM

## 2017-05-12 DIAGNOSIS — N183 Chronic kidney disease, stage 3 unspecified: Secondary | ICD-10-CM

## 2017-05-12 DIAGNOSIS — E875 Hyperkalemia: Secondary | ICD-10-CM

## 2017-05-12 DIAGNOSIS — E7800 Pure hypercholesterolemia, unspecified: Secondary | ICD-10-CM | POA: Diagnosis not present

## 2017-05-12 NOTE — Progress Notes (Signed)
   HPI  Patient presents today here to follow-up for chronic medical conditions.  Patient has history of CAD status post CABG No chest pain good medication compliance.  Patient has had labs and follow-up labs before visit. Her recent labs have shown controlled LDL and lightly elevated potassium with chronic kidney disease stage III. Repeat labs shows steady potassium.  Patient has been eating more dried nuts than usual which is a source of high potassium. She avoids NSAIDs  She has had BL low back pain that is mild to mod off and on lately.  No dysuria, fever, chills, sweats  Consistent with previous arthritis related pain  PMH: Smoking status noted ROS: Per HPI  Objective: BP (!) 123/50   Pulse (!) 54   Temp (!) 97.1 F (36.2 C) (Oral)   Ht 5\' 2"  (1.575 m)   Wt 167 lb 12.8 oz (76.1 kg)   BMI 30.69 kg/m  Gen: NAD, alert, cooperative with exam HEENT: NCAT CV: RRR, good S1/S2, no murmur Resp: CTABL, no wheezes, non-labored Abd: SNTND, no suprapubic tenderness, no CVA tenderness Ext: No edema, warm Neuro: Alert and oriented, No gross deficits MSK Mild tenderness to palpation of paraspinal lumbar muscles bilaterally  Assessment and plan:  # Hyperlipidemia With CAD, LDL goal less than 70, recent LDL measured at 68. No changes  # Hyperkalemia Patient with mild renal disease as well as taking ARB. Given history of CAD problem not stop ARB yet, however I have explained that we should monitor her potassium very carefully. She's asymptomatic Repeat BMP in one month, lab only okay  # Chronic kidney disease stage III Discussed at length, avoid NSAIDs Continue to monitor  # Chronic bilateral low back pain Avoid NSAIDs, patient's symptoms are mild to moderate and intermittent. She will try Tylenol No Red flags    Orders Placed This Encounter  Procedures  . Basic Metabolic Panel    Standing Status:   Future    Standing Expiration Date:   05/12/2018     Laroy Apple, MD [facility] 05/12/2017, 10:23 AM

## 2017-05-18 ENCOUNTER — Other Ambulatory Visit: Payer: Self-pay | Admitting: Cardiovascular Disease

## 2017-06-17 ENCOUNTER — Ambulatory Visit (INDEPENDENT_AMBULATORY_CARE_PROVIDER_SITE_OTHER): Payer: Medicare HMO | Admitting: Family Medicine

## 2017-06-17 ENCOUNTER — Encounter: Payer: Self-pay | Admitting: Family Medicine

## 2017-06-17 VITALS — BP 132/51 | HR 59 | Temp 97.6°F | Wt 164.0 lb

## 2017-06-17 DIAGNOSIS — E871 Hypo-osmolality and hyponatremia: Secondary | ICD-10-CM

## 2017-06-17 DIAGNOSIS — I10 Essential (primary) hypertension: Secondary | ICD-10-CM

## 2017-06-17 DIAGNOSIS — N183 Chronic kidney disease, stage 3 unspecified: Secondary | ICD-10-CM

## 2017-06-17 DIAGNOSIS — A084 Viral intestinal infection, unspecified: Secondary | ICD-10-CM

## 2017-06-17 MED ORDER — ONDANSETRON 4 MG PO TBDP: 4.0000 mg | ORAL_TABLET | Freq: Three times a day (TID) | ORAL | 0 refills | Status: DC | PRN

## 2017-06-17 NOTE — Progress Notes (Signed)
   HPI  Patient presents today here at nausea and abdominal pain.  Patient explains that she's had nausea for about 3 days, over the last 2 days she's developed nausea and abdominal pain, fatigue and weakness, and mild to moderate throbbing occipital headache.  She is tolerating fluids normally. Food tends to make her very nauseous. She has not had any episodes of emesis or diarrhea.  She has 2 sick contacts with similar illness that resolved after about 3 days.  Hypertension, chronic kidney disease Good medication compliance Has been taking Avapro for years, we discussed the concern about potassium  PMH: Smoking status noted ROS: Per HPI  Objective: BP (!) 132/51   Pulse (!) 59   Temp 97.6 F (36.4 C) (Oral)   Wt 164 lb (74.4 kg)   BMI 30.00 kg/m  Gen: NAD, alert, cooperative with exam HEENT: NCAT, oropharynx moist and clear, TMs obscured breasts are removed bilaterally, EOMI, PERRLA CV: RRR, good S1/S2, no murmur Resp: CTABL, no wheezes, non-labored Abd: soft, mild TTP throughout,no guarding,. + BS Neuro: Alert and oriented, No gross deficits  Assessment and plan:  # Viral gastroenteritis Most likely diagnosis given sick contacts and constellation of symptoms Treat symptomatically with Zofran, encourage fluids CBC, CMP  # Chronic kidney disease Patient with chronic kidney disease stage III and previously elevated potassium, repeat labs If creatinine and potassium are stable will continue, if worsening unbelievably change  Hypertension Well-controlled on metoprolol, Imdur, Avapro No changes as long as potassium and renal function are stable, potassium has been borderline for renal disease. May need to change ARB to calcium channel blocker  Orders Placed This Encounter  Procedures  . CBC with Differential/Platelet  . CMP14+EGFR    Meds ordered this encounter  Medications  . ondansetron (ZOFRAN ODT) 4 MG disintegrating tablet    Sig: Take 1 tablet (4 mg total)  by mouth every 8 (eight) hours as needed for nausea or vomiting.    Dispense:  20 tablet    Refill:  0    Laroy Apple, MD [facility] 06/17/2017, 1:17 PM

## 2017-06-17 NOTE — Patient Instructions (Addendum)
Great to see you!  Come back as needed  Try zofran and focus on getting plenty of fluids. Keep meals small and frequent   Viral Gastroenteritis, Adult Viral gastroenteritis is also known as the stomach flu. This condition is caused by certain germs (viruses). These germs can be passed from person to person very easily (are very contagious). This condition can cause sudden watery poop (diarrhea), fever, and throwing up (vomiting). Having watery poop and throwing up can make you feel weak and cause you to get dehydrated. Dehydration can make you tired and thirsty, make you have a dry mouth, and make it so you pee (urinate) less often. Older adults and people with other diseases or a weak defense system (immune system) are at higher risk for dehydration. It is important to replace the fluids that you lose from having watery poop and throwing up. Follow these instructions at home: Follow instructions from your doctor about how to care for yourself at home. Eating and drinking  Follow these instructions as told by your doctor:  Take an oral rehydration solution (ORS). This is a drink that is sold at pharmacies and stores.  Drink clear fluids in small amounts as you are able, such as: ? Water. ? Ice chips. ? Diluted fruit juice. ? Low-calorie sports drinks.  Eat bland, easy-to-digest foods in small amounts as you are able, such as: ? Bananas. ? Applesauce. ? Rice. ? Low-fat (lean) meats. ? Toast. ? Crackers.  Avoid fluids that have a lot of sugar or caffeine in them.  Avoid alcohol.  Avoid spicy or fatty foods.  General instructions  Drink enough fluid to keep your pee (urine) clear or pale yellow.  Wash your hands often. If you cannot use soap and water, use hand sanitizer.  Make sure that all people in your home wash their hands well and often.  Rest at home while you get better.  Take over-the-counter and prescription medicines only as told by your doctor.  Watch your  condition for any changes.  Take a warm bath to help with any burning or pain from having watery poop.  Keep all follow-up visits as told by your doctor. This is important. Contact a doctor if:  You cannot keep fluids down.  Your symptoms get worse.  You have new symptoms.  You feel light-headed or dizzy.  You have muscle cramps. Get help right away if:  You have chest pain.  You feel very weak or you pass out (faint).  You see blood in your throw-up.  Your throw-up looks like coffee grounds.  You have bloody or black poop (stools) or poop that look like tar.  You have a very bad headache, a stiff neck, or both.  You have a rash.  You have very bad pain, cramping, or bloating in your belly (abdomen).  You have trouble breathing.  You are breathing very quickly.  Your heart is beating very quickly.  Your skin feels cold and clammy.  You feel confused.  You have pain when you pee.  You have signs of dehydration, such as: ? Dark pee, hardly any pee, or no pee. ? Cracked lips. ? Dry mouth. ? Sunken eyes. ? Sleepiness. ? Weakness. This information is not intended to replace advice given to you by your health care provider. Make sure you discuss any questions you have with your health care provider. Document Released: 03/08/2008 Document Revised: 04/09/2016 Document Reviewed: 05/27/2015 Elsevier Interactive Patient Education  2017 [employer].

## 2017-06-18 LAB — CMP14+EGFR
ALT: 24 IU/L (ref 0–32)
AST: 26 IU/L (ref 0–40)
Albumin/Globulin Ratio: 1.6 (ref 1.2–2.2)
Albumin: 4.3 g/dL (ref 3.5–4.8)
Alkaline Phosphatase: 52 IU/L (ref 39–117)
BILIRUBIN TOTAL: 0.7 mg/dL (ref 0.0–1.2)
BUN/Creatinine Ratio: 10 — ABNORMAL LOW (ref 12–28)
BUN: 12 mg/dL (ref 8–27)
CALCIUM: 9.4 mg/dL (ref 8.7–10.3)
CHLORIDE: 92 mmol/L — AB (ref 96–106)
CO2: 24 mmol/L (ref 20–29)
Creatinine, Ser: 1.15 mg/dL — ABNORMAL HIGH (ref 0.57–1.00)
GFR, EST AFRICAN AMERICAN: 55 mL/min/{1.73_m2} — AB (ref >59)
GFR, EST NON AFRICAN AMERICAN: 48 mL/min/{1.73_m2} — AB (ref >59)
GLUCOSE: 88 mg/dL (ref 65–99)
Globulin, Total: 2.7 g/dL (ref 1.5–4.5)
Potassium: 5 mmol/L (ref 3.5–5.2)
Sodium: 130 mmol/L — ABNORMAL LOW (ref 134–144)
TOTAL PROTEIN: 7 g/dL (ref 6.0–8.5)

## 2017-06-18 LAB — CBC WITH DIFFERENTIAL/PLATELET
BASOS: 1 %
Basophils Absolute: 0 10*3/uL (ref 0.0–0.2)
EOS (ABSOLUTE): 0.1 10*3/uL (ref 0.0–0.4)
EOS: 2 %
Hematocrit: 37.3 % (ref 34.0–46.6)
Hemoglobin: 12.8 g/dL (ref 11.1–15.9)
IMMATURE GRANS (ABS): 0 10*3/uL (ref 0.0–0.1)
IMMATURE GRANULOCYTES: 0 %
LYMPHS ABS: 0.7 10*3/uL (ref 0.7–3.1)
Lymphs: 16 %
MCH: 29.9 pg (ref 26.6–33.0)
MCHC: 34.3 g/dL (ref 31.5–35.7)
MCV: 87 fL (ref 79–97)
MONOS ABS: 0.6 10*3/uL (ref 0.1–0.9)
Monocytes: 12 %
Neutrophils Absolute: 3.2 10*3/uL (ref 1.4–7.0)
Neutrophils: 69 %
PLATELETS: 188 10*3/uL (ref 150–379)
RBC: 4.28 x10E6/uL (ref 3.77–5.28)
RDW: 13 % (ref 12.3–15.4)
WBC: 4.6 10*3/uL (ref 3.4–10.8)

## 2017-06-20 NOTE — Addendum Note (Signed)
Addended by: Thana Ates on: 06/20/2017 08:50 AM   Modules accepted: Orders

## 2017-06-27 ENCOUNTER — Other Ambulatory Visit: Payer: Medicare HMO

## 2017-06-27 DIAGNOSIS — N183 Chronic kidney disease, stage 3 unspecified: Secondary | ICD-10-CM

## 2017-06-28 LAB — BMP8+EGFR
BUN/Creatinine Ratio: 12 (ref 12–28)
BUN: 13 mg/dL (ref 8–27)
CALCIUM: 9.2 mg/dL (ref 8.7–10.3)
CO2: 25 mmol/L (ref 20–29)
Chloride: 97 mmol/L (ref 96–106)
Creatinine, Ser: 1.12 mg/dL — ABNORMAL HIGH (ref 0.57–1.00)
GFR calc Af Amer: 57 mL/min/{1.73_m2} — ABNORMAL LOW (ref >59)
GFR calc non Af Amer: 49 mL/min/{1.73_m2} — ABNORMAL LOW (ref >59)
Glucose: 98 mg/dL (ref 65–99)
POTASSIUM: 5.6 mmol/L — AB (ref 3.5–5.2)
SODIUM: 136 mmol/L (ref 134–144)

## 2017-07-06 ENCOUNTER — Other Ambulatory Visit: Payer: Self-pay | Admitting: Cardiovascular Disease

## 2017-07-06 ENCOUNTER — Other Ambulatory Visit: Payer: Self-pay | Admitting: Family Medicine

## 2017-07-12 ENCOUNTER — Other Ambulatory Visit (INDEPENDENT_AMBULATORY_CARE_PROVIDER_SITE_OTHER): Payer: Medicare HMO

## 2017-07-12 DIAGNOSIS — Z23 Encounter for immunization: Secondary | ICD-10-CM

## 2017-07-12 DIAGNOSIS — E871 Hypo-osmolality and hyponatremia: Secondary | ICD-10-CM

## 2017-07-12 LAB — BMP8+EGFR
BUN/Creatinine Ratio: 11 — ABNORMAL LOW (ref 12–28)
BUN: 12 mg/dL (ref 8–27)
CALCIUM: 9.1 mg/dL (ref 8.7–10.3)
CO2: 23 mmol/L (ref 20–29)
CREATININE: 1.07 mg/dL — AB (ref 0.57–1.00)
Chloride: 102 mmol/L (ref 96–106)
GFR calc Af Amer: 60 mL/min/{1.73_m2} (ref >59)
GFR, EST NON AFRICAN AMERICAN: 52 mL/min/{1.73_m2} — AB (ref >59)
GLUCOSE: 93 mg/dL (ref 65–99)
Potassium: 5.6 mmol/L — ABNORMAL HIGH (ref 3.5–5.2)
SODIUM: 137 mmol/L (ref 134–144)

## 2017-07-15 ENCOUNTER — Other Ambulatory Visit: Payer: Self-pay | Admitting: *Deleted

## 2017-07-15 MED ORDER — AMLODIPINE BESYLATE 5 MG PO TABS: 5.0000 mg | ORAL_TABLET | Freq: Every day | ORAL | 2 refills | Status: DC

## 2017-07-18 ENCOUNTER — Telehealth: Payer: Self-pay | Admitting: Cardiovascular Disease

## 2017-07-18 NOTE — Telephone Encounter (Signed)
Patient wanted to  KNOW IF Dr Gwenlyn Found was aware of THE CHANGE IN medication  Per Dr Wendi Snipes.  Patient request  Dr Gwenlyn Found review lab work.  Patient primary  Would like to stop  Irbesartan 75 mg to Amlodipine 5 mg daily."do to kidney issues"   Patient states she will not start medication until she gets the okay  From Dr Gwenlyn Found. RN  Informed  Patient will defer to Dr Gwenlyn Found  WILL CALL BACK

## 2017-07-18 NOTE — Telephone Encounter (Signed)
Her SCr is fine. I see  No reason to change unless she's having issues with Avapro or it's not working

## 2017-07-18 NOTE — Telephone Encounter (Signed)
New MEssage  Pt c/o medication issue:  1. Name of Medication: Amlodipine   2. How are you currently taking this medication (dosage and times per day)? 50  3. Are you having a reaction (difficulty breathing--STAT)? No   4. What is your medication issue? Pt would like to speak with RN before taking this medication. PT states PCP prescribed... Please call back to discuss

## 2017-07-19 ENCOUNTER — Other Ambulatory Visit: Payer: Self-pay | Admitting: Family Medicine

## 2017-07-19 DIAGNOSIS — Z1231 Encounter for screening mammogram for malignant neoplasm of breast: Secondary | ICD-10-CM

## 2017-08-02 ENCOUNTER — Other Ambulatory Visit: Payer: Self-pay | Admitting: Family Medicine

## 2017-08-05 ENCOUNTER — Ambulatory Visit
Admission: RE | Admit: 2017-08-05 | Discharge: 2017-08-05 | Disposition: A | Discharge status: Home / Self Care | Payer: Medicare HMO | Source: Ambulatory Visit | Attending: Family Medicine | Admitting: Family Medicine

## 2017-08-05 DIAGNOSIS — Z1231 Encounter for screening mammogram for malignant neoplasm of breast: Secondary | ICD-10-CM | POA: Diagnosis not present

## 2017-08-11 NOTE — Telephone Encounter (Signed)
Recommendations given to pt husband, Larry--OK per DPR.

## 2017-08-15 ENCOUNTER — Ambulatory Visit (INDEPENDENT_AMBULATORY_CARE_PROVIDER_SITE_OTHER): Payer: Medicare HMO | Admitting: Family Medicine

## 2017-08-15 ENCOUNTER — Encounter: Payer: Self-pay | Admitting: Family Medicine

## 2017-08-15 VITALS — BP 114/61 | HR 61 | Temp 96.9°F | Ht 62.0 in | Wt 163.2 lb

## 2017-08-15 DIAGNOSIS — I10 Essential (primary) hypertension: Secondary | ICD-10-CM | POA: Diagnosis not present

## 2017-08-15 DIAGNOSIS — Z1239 Encounter for other screening for malignant neoplasm of breast: Secondary | ICD-10-CM

## 2017-08-15 DIAGNOSIS — Z1231 Encounter for screening mammogram for malignant neoplasm of breast: Secondary | ICD-10-CM

## 2017-08-15 NOTE — Progress Notes (Signed)
   HPI  Patient presents today here to follow-up for chronic medical conditions.  Mammogram results-patient brings in her mammogram results to discuss.  She has reported increased breast density.  We discussed pros and cons of further screening and she would like to defer further screening and evaluation for now. She has no suspicious breast lumps.  Hypertension Patient is doing well on current medications We were monitoring her potassium closely which was minimally elevated.  She has found some sources of dietary increased potassium intake which she has adjusted. She has discussed with cardiology who recommend she continues ARB for now.  We discussed this as well.  PMH: Smoking status noted ROS: Per HPI  Objective: BP 114/61   Pulse 61   Temp (!) 96.9 F (36.1 C) (Oral)   Ht '5\' 2"'$  (1.575 m)   Wt 163 lb 3.2 oz (74 kg)   BMI 29.85 kg/m  Gen: NAD, alert, cooperative with exam HEENT: NCAT, EOMI, PERRL CV: RRR, good S1/S2, no murmur Resp: CTABL, no wheezes, non-labored Ext: No edema, warm Neuro: Alert and oriented, No gross deficits  Assessment and plan:  #Hypertension Well-controlled on amlodipine, Avapro, Toprol. Continue current medications Monitoring potassium closely given ARB use and CKD3.  Testing has been stable over multiple checks in the last 3 months, I appreciate cardiology's opinion to continue ARB and agree.  #Screening for breast cancer Patient and I discussed pros and cons of further screening at this time, she would like to defer any additional screening. Her mammogram showed increased breast density which could decrease the effectiveness of mammogram screening which is why we discussed this to start with.   Orders Placed This Encounter  Procedures  . [place], MD [facility] 08/15/2017, 9:34 AM

## 2017-08-16 DIAGNOSIS — Z0100 Encounter for examination of eyes and vision without abnormal findings: Secondary | ICD-10-CM | POA: Diagnosis not present

## 2017-08-16 LAB — BMP8+EGFR
BUN/Creatinine Ratio: 15 (ref 12–28)
BUN: 16 mg/dL (ref 8–27)
CO2: 25 mmol/L (ref 20–29)
Calcium: 9.1 mg/dL (ref 8.7–10.3)
Chloride: 103 mmol/L (ref 96–106)
Creatinine, Ser: 1.07 mg/dL — ABNORMAL HIGH (ref 0.57–1.00)
GFR calc Af Amer: 60 mL/min/{1.73_m2} (ref >59)
GFR, EST NON AFRICAN AMERICAN: 52 mL/min/{1.73_m2} — AB (ref >59)
Glucose: 100 mg/dL — ABNORMAL HIGH (ref 65–99)
POTASSIUM: 4.7 mmol/L (ref 3.5–5.2)
SODIUM: 141 mmol/L (ref 134–144)

## 2017-08-22 ENCOUNTER — Telehealth: Payer: Self-pay | Admitting: Cardiovascular Disease

## 2017-08-22 NOTE — Telephone Encounter (Signed)
Patient calling, states that she received a letter from her insurance Kristina Gibbs) stating that they will not cover her Crestor medication unless Dr. Gwenlyn Found calls them and asks for Crestor.  Patient states that she has taken the alternatives and they were not effective and the only medication that works is the [employer].

## 2017-08-30 ENCOUNTER — Other Ambulatory Visit: Payer: Self-pay | Admitting: Family Medicine

## 2017-09-02 NOTE — Telephone Encounter (Signed)
Erroneous encounter

## 2017-09-20 ENCOUNTER — Encounter: Payer: Self-pay | Admitting: Physician Assistant

## 2017-09-20 ENCOUNTER — Ambulatory Visit (INDEPENDENT_AMBULATORY_CARE_PROVIDER_SITE_OTHER): Payer: Medicare HMO | Admitting: Physician Assistant

## 2017-09-20 VITALS — BP 136/64 | HR 72 | Temp 97.5°F | Ht 62.0 in | Wt 161.0 lb

## 2017-09-20 DIAGNOSIS — J0100 Acute maxillary sinusitis, unspecified: Secondary | ICD-10-CM | POA: Diagnosis not present

## 2017-09-20 DIAGNOSIS — R110 Nausea: Secondary | ICD-10-CM

## 2017-09-20 MED ORDER — ONDANSETRON 4 MG PO TBDP: 4.0000 mg | ORAL_TABLET | Freq: Three times a day (TID) | ORAL | 0 refills | Status: DC | PRN

## 2017-09-20 MED ORDER — BENZONATATE 200 MG PO CAPS: 200.0000 mg | ORAL_CAPSULE | Freq: Three times a day (TID) | ORAL | 0 refills | Status: DC | PRN

## 2017-09-20 MED ORDER — CEFDINIR 300 MG PO CAPS: 300.0000 mg | ORAL_CAPSULE | Freq: Two times a day (BID) | ORAL | 0 refills | Status: DC

## 2017-09-20 NOTE — Progress Notes (Signed)
BP 136/64   Pulse 72   Temp (!) 97.5 F (36.4 C) (Oral)   Ht 5\' 2"  (1.575 m)   Wt 161 lb (73 kg)   SpO2 98%   BMI 29.45 kg/m    Subjective:    Patient ID: Kristina Gibbs, female    DOB: 1946-08-04, 71 y.o.   MRN: [id number]  HPI: Kristina Gibbs is a 71 y.o. female presenting on 09/20/2017 for Cough and Emesis  Patient with several days of progressing upper respiratory and bronchial symptoms. Initially there was more upper respiratory congestion. This progressed to having significant cough that is productive throughout the day and severe at night. There is occasional wheezing after coughing. Sometimes there is slight dyspnea on exertion. It is productive mucus that is yellow in color. Denies any blood. Nausea is present too and vomited after a bout of coughing, keeping fluids down.  Relevant past medical, surgical, family and social history reviewed and updated as indicated. Allergies and medications reviewed and updated.  Past Medical History:  Diagnosis Date  . Allergy   . Arthritis   . Cancer ([place])    ovarian  . Coronary artery disease   . Heart murmur   . Hypercholesteremia   . Hypertension   . 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 Osteopenia   . Stomach problems   . Unstable angina ([place]) 05/27/2014    Past Surgical History:  Procedure Laterality Date  . ABDOMINAL HYSTERECTOMY    . CHOLECYSTECTOMY    . CORONARY ARTERY BYPASS GRAFT    . EYE SURGERY     bilateral cataract extraction  . HAND SURGERY Left    injurred hand at work- Programmer, applications  . hysterectomy    . LEFT HEART CATHETERIZATION WITH CORONARY ANGIOGRAM N/A 05/27/2014   Procedure: LEFT HEART CATHETERIZATION WITH CORONARY ANGIOGRAM;  Surgeon: [name], MD;  Location: [facility] CATH LAB;  Service: Cardiovascular;  Laterality: N/A;  . PCTA     x7     Review of Systems  Constitutional: Positive for chills and fatigue. Negative for activity change, appetite change and fever.  HENT: Positive for  congestion, postnasal drip and sore throat.   Eyes: Negative.   Respiratory: Negative for cough and wheezing.   Cardiovascular: Negative.  Negative for chest pain, palpitations and leg swelling.  Gastrointestinal: Negative.   Genitourinary: Negative.   Musculoskeletal: Negative.   Skin: Negative.   Neurological: Positive for headaches.    Allergies as of 09/20/2017      Reactions   Lactose Intolerance (gi) Nausea And Vomiting, Nausea Only   Codeine Nausea And Vomiting   Hydrocortisone Itching      Medication List        Accurate as of 09/20/17 10:55 AM. Always use your most recent med list.          alendronate 70 MG tablet Commonly known as:  FOSAMAX TAKE 1 TABLET BY MOUTH ONCE A WEEK. TAKE WITH A FULL GLASS OF WATER ON AN EMPTY STOMACH.   AMITIZA 24 MCG capsule Generic drug:  lubiprostone TAKE 1 CAPSULE BY MOUTH TWO TIMES DAILY WITH MEALS   amLODipine 5 MG tablet Commonly known as:  NORVASC Take 1 tablet (5 mg total) by mouth daily.   aspirin EC 81 MG tablet Take 81 mg by mouth daily.   b complex vitamins tablet Take 1 tablet by mouth daily.   benzonatate 200 MG capsule Commonly known as:  TESSALON Take 1 capsule (200  mg total) by mouth 3 (three) times daily as needed for cough.   calcium carbonate 600 MG Tabs tablet Commonly known as:  OS-CAL Take 600 mg by mouth daily.   cefdinir 300 MG capsule Commonly known as:  OMNICEF Take 1 capsule (300 mg total) by mouth 2 (two) times daily.   cholecalciferol 1000 units tablet Commonly known as:  VITAMIN D Take 1,000 Units by mouth daily.   clopidogrel 75 MG tablet Commonly known as:  PLAVIX TAKE 1 TABLET BY MOUTH DAILY   CO ENZYME Q-10 PO Take 1 capsule by mouth daily.   CRESTOR 20 MG tablet Generic drug:  rosuvastatin TAKE 1 TABLET (20 MG TOTAL) BY MOUTH DAILY.   irbesartan 75 MG tablet Commonly known as:  AVAPRO TAKE 1 TABLET (75 MG TOTAL) BY MOUTH AT BEDTIME.   isosorbide mononitrate 60 MG 24 hr  tablet Commonly known as:  IMDUR TAKE 1 TABLET BY MOUTH DAILY   metoprolol succinate 25 MG 24 hr tablet Commonly known as:  TOPROL-XL TAKE 1/2 TABLET BY MOUTH DAILY   nitroGLYCERIN 0.4 MG/SPRAY spray Commonly known as:  NITROLINGUAL Place 1 spray under the tongue every 5 (five) minutes as needed for chest pain.   omega-3 acid ethyl esters 1 g capsule Commonly known as:  LOVAZA TAKE 1 CAPSULE BY MOUTH FOUR TIMES DAILY   ondansetron 4 MG disintegrating tablet Commonly known as:  ZOFRAN ODT Take 1 tablet (4 mg total) by mouth every 8 (eight) hours as needed for nausea or vomiting.   pantoprazole 40 MG tablet Commonly known as:  PROTONIX TAKE 1 TABLET BY MOUTH EVERY DAY   PATADAY 0.2 % Soln Generic drug:  Olopatadine HCl Place 1 drop into both eyes 2 (two) times daily.   RANEXA 500 MG 12 hr tablet Generic drug:  ranolazine TAKE 1 TABLET (500 MG TOTAL) BY MOUTH 2 (TWO) TIMES DAILY.   vitamin C 500 MG tablet Commonly known as:  ASCORBIC ACID Take 1,000 mg by mouth daily.          Objective:    BP 136/64   Pulse 72   Temp (!) 97.5 F (36.4 C) (Oral)   Ht 5\' 2"  (1.575 m)   Wt 161 lb (73 kg)   SpO2 98%   BMI 29.45 kg/m   Allergies  Allergen Reactions  . Lactose Intolerance (Gi) Nausea And Vomiting and Nausea Only  . Codeine Nausea And Vomiting  . Hydrocortisone Itching    Physical Exam  Constitutional: She is oriented to person, place, and time. She appears well-developed and well-nourished.  HENT:  Head: Normocephalic and atraumatic.  Right Ear: A middle ear effusion is present.  Left Ear: A middle ear effusion is present.  Nose: Mucosal edema and sinus tenderness present. Right sinus exhibits no frontal sinus tenderness. Left sinus exhibits no frontal sinus tenderness.  Mouth/Throat: Posterior oropharyngeal erythema present. No oropharyngeal exudate or tonsillar abscesses.  Eyes: Conjunctivae and EOM are normal. Pupils are equal, round, and reactive to  light.  Neck: Normal range of motion.  Cardiovascular: Normal rate, regular rhythm, normal heart sounds and intact distal pulses.  Pulmonary/Chest: Effort normal and breath sounds normal.  Abdominal: Soft. Bowel sounds are normal.  Neurological: She is alert and oriented to person, place, and time. She has normal reflexes.  Skin: Skin is warm and dry. No rash noted.  Psychiatric: She has a normal mood and affect. Her behavior is normal. Judgment and thought content normal.  Nursing note and vitals reviewed.  Assessment & Plan:   1. Acute non-recurrent maxillary sinusitis - cefdinir (OMNICEF) 300 MG capsule; Take 1 capsule (300 mg total) by mouth 2 (two) times daily.  Dispense: 20 capsule; Refill: 0 - benzonatate (TESSALON) 200 MG capsule; Take 1 capsule (200 mg total) by mouth 3 (three) times daily as needed for cough.  Dispense: 30 capsule; Refill: 0  2. Nausea - ondansetron (ZOFRAN ODT) 4 MG disintegrating tablet; Take 1 tablet (4 mg total) by mouth every 8 (eight) hours as needed for nausea or vomiting.  Dispense: 20 tablet; Refill: 0    Current Outpatient Medications:  .  alendronate (FOSAMAX) 70 MG tablet, TAKE 1 TABLET BY MOUTH ONCE A WEEK. TAKE WITH A FULL GLASS OF WATER ON AN EMPTY STOMACH., Disp: 12 tablet, Rfl: 0 .  AMITIZA 24 MCG capsule, TAKE 1 CAPSULE BY MOUTH TWO TIMES DAILY WITH MEALS, Disp: 180 capsule, Rfl: 0 .  amLODipine (NORVASC) 5 MG tablet, Take 1 tablet (5 mg total) by mouth daily., Disp: 30 tablet, Rfl: 2 .  aspirin EC 81 MG tablet, Take 81 mg by mouth daily., Disp: , Rfl:  .  b complex vitamins tablet, Take 1 tablet by mouth daily. , Disp: , Rfl:  .  benzonatate (TESSALON) 200 MG capsule, Take 1 capsule (200 mg total) by mouth 3 (three) times daily as needed for cough., Disp: 30 capsule, Rfl: 0 .  calcium carbonate (OS-CAL) 600 MG TABS, Take 600 mg by mouth daily., Disp: , Rfl:  .  cefdinir (OMNICEF) 300 MG capsule, Take 1 capsule (300 mg total) by mouth  2 (two) times daily., Disp: 20 capsule, Rfl: 0 .  cholecalciferol (VITAMIN D) 1000 UNITS tablet, Take 1,000 Units by mouth daily., Disp: , Rfl:  .  clopidogrel (PLAVIX) 75 MG tablet, TAKE 1 TABLET BY MOUTH DAILY, Disp: 30 tablet, Rfl: 11 .  CO ENZYME Q-10 PO, Take 1 capsule by mouth daily. , Disp: , Rfl:  .  CRESTOR 20 MG tablet, TAKE 1 TABLET (20 MG TOTAL) BY MOUTH DAILY., Disp: 90 tablet, Rfl: 1 .  irbesartan (AVAPRO) 75 MG tablet, TAKE 1 TABLET (75 MG TOTAL) BY MOUTH AT BEDTIME., Disp: 30 tablet, Rfl: 9 .  isosorbide mononitrate (IMDUR) 60 MG 24 hr tablet, TAKE 1 TABLET BY MOUTH DAILY (Patient taking differently: Take 60 mg by mouth in the morning), Disp: 30 tablet, Rfl: 10 .  metoprolol succinate (TOPROL-XL) 25 MG 24 hr tablet, TAKE 1/2 TABLET BY MOUTH DAILY (Patient taking differently: Take 12.5 mg by mouth in the morning), Disp: 30 tablet, Rfl: 3 .  nitroGLYCERIN (NITROLINGUAL) 0.4 MG/SPRAY spray, Place 1 spray under the tongue every 5 (five) minutes as needed for chest pain., Disp: 12 g, Rfl: 3 .  omega-3 acid ethyl esters (LOVAZA) 1 g capsule, TAKE 1 CAPSULE BY MOUTH FOUR TIMES DAILY, Disp: 120 capsule, Rfl: 6 .  ondansetron (ZOFRAN ODT) 4 MG disintegrating tablet, Take 1 tablet (4 mg total) by mouth every 8 (eight) hours as needed for nausea or vomiting., Disp: 20 tablet, Rfl: 0 .  pantoprazole (PROTONIX) 40 MG tablet, TAKE 1 TABLET BY MOUTH EVERY DAY, Disp: 30 tablet, Rfl: 11 .  PATADAY 0.2 % SOLN, Place 1 drop into both eyes 2 (two) times daily. , Disp: , Rfl: 6 .  RANEXA 500 MG 12 hr tablet, TAKE 1 TABLET (500 MG TOTAL) BY MOUTH 2 (TWO) TIMES DAILY., Disp: 60 tablet, Rfl: 7 .  vitamin C (ASCORBIC ACID) 500 MG tablet, Take 1,000 mg  by mouth daily. , Disp: , Rfl:  Continue all other maintenance medications as listed above.  Follow up plan: No Follow-up on file.  Educational handout given for [place] PA-C [place] [address]    [place], [place] [address] [phone]   09/20/2017, 10:55 AM

## 2017-09-20 NOTE — Patient Instructions (Signed)
In a few days you may receive a survey in the mail or online from [employer] regarding your visit with us today. Please take a moment to fill this out. Your feedback is very important to our whole office. It can help us better understand your needs as well as improve your experience and satisfaction. Thank you for taking your time to complete it. We care about you.  Cristan Scherzer, PA-C  

## 2017-09-29 ENCOUNTER — Telehealth: Payer: Self-pay | Admitting: Family Medicine

## 2017-09-29 DIAGNOSIS — J0100 Acute maxillary sinusitis, unspecified: Secondary | ICD-10-CM

## 2017-09-29 MED ORDER — BENZONATATE 200 MG PO CAPS: 200.0000 mg | ORAL_CAPSULE | Freq: Three times a day (TID) | ORAL | 0 refills | Status: DC | PRN

## 2017-09-29 NOTE — Telephone Encounter (Signed)
Patient seen Kristina Gibbs 12/18 and was prescribed tessalon 200mg  #30 no refills and requesting a refill of it. Covering PCP- please advise

## 2017-09-29 NOTE — Telephone Encounter (Signed)
Prescription sent to pharmacy.

## 2017-09-29 NOTE — Telephone Encounter (Signed)
Refer to previous phone note- patient aware rx sent.

## 2017-10-06 ENCOUNTER — Other Ambulatory Visit: Payer: Self-pay | Admitting: Family Medicine

## 2017-10-10 ENCOUNTER — Other Ambulatory Visit: Payer: Self-pay | Admitting: Cardiovascular Disease

## 2017-10-17 ENCOUNTER — Other Ambulatory Visit: Payer: Self-pay | Admitting: Cardiovascular Disease

## 2017-10-17 MED ORDER — CRESTOR 20 MG PO TABS: 20.0000 mg | ORAL_TABLET | Freq: Every day | ORAL | 6 refills | Status: DC

## 2017-10-17 MED ORDER — RANOLAZINE ER 500 MG PO TB12: ORAL_TABLET | ORAL | 6 refills | Status: DC

## 2017-10-17 NOTE — Telephone Encounter (Signed)
Rx(s) sent to pharmacy electronically.  

## 2017-10-17 NOTE — Telephone Encounter (Signed)
° ° °*  STAT* If patient is at the pharmacy, call can be transferred to refill team.   1. Which medications need to be refilled? (please list name of each medication and dose if known) RANEXA 500 MG 12 hr tablet    CRESTOR 20 MG tablet 2. Which pharmacy/location (including street and city if local pharmacy) is medication to [facility] to[facility] [address] [facility] [place]   3. Do they need a 30 day or 90 day supply? [place]

## 2017-10-27 ENCOUNTER — Telehealth: Payer: Self-pay | Admitting: Family Medicine

## 2017-10-27 NOTE — Telephone Encounter (Signed)
Per Dr. Wendi Snipes patient is to be on both amlodipine and metoprolol. Patient aware and verbalizes understanding.

## 2017-11-15 ENCOUNTER — Ambulatory Visit (INDEPENDENT_AMBULATORY_CARE_PROVIDER_SITE_OTHER): Payer: Medicare HMO | Admitting: Family Medicine

## 2017-11-15 ENCOUNTER — Encounter: Payer: Self-pay | Admitting: Family Medicine

## 2017-11-15 VITALS — BP 106/67 | HR 93 | Temp 97.5°F | Ht 62.0 in | Wt 160.0 lb

## 2017-11-15 DIAGNOSIS — L989 Disorder of the skin and subcutaneous tissue, unspecified: Secondary | ICD-10-CM

## 2017-11-15 DIAGNOSIS — E7800 Pure hypercholesterolemia, unspecified: Secondary | ICD-10-CM

## 2017-11-15 DIAGNOSIS — I10 Essential (primary) hypertension: Secondary | ICD-10-CM

## 2017-11-15 NOTE — Progress Notes (Signed)
   HPI  Patient presents today follow-up chronic medical conditions.  Hypertension Good medication compliance, no side effects.  Skin lesions Present for several months, bilateral arms, neck, and face, neck and face are improving. She is concerned because her mother and her brother had issues with platelet dysfunction and low platelet counts before they died.  Her brother had a type of leukemia (I suspect myelodysplastic syndrome from her description).  Hyperlipidemia Doing very well with Crestor, recently had to have prior authorization by cardiology.  PMH: Smoking status noted ROS: Per HPI  Objective: BP 106/67   Pulse 93   Temp (!) 97.5 F (36.4 C) (Oral)   Ht 5\' 2"  (1.575 m)   Wt 160 lb (72.6 kg)   BMI 29.26 kg/m  Gen: NAD, alert, cooperative with exam HEENT: NCAT CV: RRR, good S1/S2, no murmur Resp: CTABL, no wheezes, non-labored Ext: No edema, warm Neuro: Alert and oriented, No gross deficits  Skin Purple discoloration that is flat on bilateral arms approximately 4-10 mm in diameter each, irregular borders  Assessment and plan:  #Skin lesion Purpuric in appearance, patient is on double antiplatelet for heart disease, I will check CBC for platelet count  #Hypertension Well-controlled Previous slight elevations of potassium, patient has mild chronic kidney disease  #Hyperlipidemia Does well on Crestor brand-name, difficulty tolerating generic and multiple other statins Controlled on check 6 months ago, planning check later in the summer.    Laroy Apple, MD [facility] 11/15/2017, 9:49 AM

## 2017-11-16 LAB — CBC WITH DIFFERENTIAL/PLATELET
BASOS ABS: 0.1 10*3/uL (ref 0.0–0.2)
Basos: 1 %
EOS (ABSOLUTE): 0.2 10*3/uL (ref 0.0–0.4)
Eos: 4 %
Hematocrit: 41.2 % (ref 34.0–46.6)
Hemoglobin: 13.2 g/dL (ref 11.1–15.9)
IMMATURE GRANS (ABS): 0 10*3/uL (ref 0.0–0.1)
IMMATURE GRANULOCYTES: 0 %
LYMPHS: 21 %
Lymphocytes Absolute: 1.2 10*3/uL (ref 0.7–3.1)
MCH: 28.5 pg (ref 26.6–33.0)
MCHC: 32 g/dL (ref 31.5–35.7)
MCV: 89 fL (ref 79–97)
MONOS ABS: 0.5 10*3/uL (ref 0.1–0.9)
Monocytes: 8 %
NEUTROS PCT: 66 %
Neutrophils Absolute: 3.6 10*3/uL (ref 1.4–7.0)
PLATELETS: 232 10*3/uL (ref 150–379)
RBC: 4.63 x10E6/uL (ref 3.77–5.28)
RDW: 14 % (ref 12.3–15.4)
WBC: 5.5 10*3/uL (ref 3.4–10.8)

## 2017-11-16 LAB — BMP8+EGFR
BUN/Creatinine Ratio: 14 (ref 12–28)
BUN: 16 mg/dL (ref 8–27)
CALCIUM: 9.4 mg/dL (ref 8.7–10.3)
CO2: 27 mmol/L (ref 20–29)
CREATININE: 1.12 mg/dL — AB (ref 0.57–1.00)
Chloride: 102 mmol/L (ref 96–106)
GFR calc Af Amer: 56 mL/min/{1.73_m2} — ABNORMAL LOW (ref >59)
GFR, EST NON AFRICAN AMERICAN: 49 mL/min/{1.73_m2} — AB (ref >59)
GLUCOSE: 97 mg/dL (ref 65–99)
Potassium: 4.7 mmol/L (ref 3.5–5.2)
SODIUM: 142 mmol/L (ref 134–144)

## 2017-11-20 ENCOUNTER — Other Ambulatory Visit: Payer: Self-pay | Admitting: Family Medicine

## 2017-11-25 ENCOUNTER — Other Ambulatory Visit: Payer: Self-pay | Admitting: Family Medicine

## 2017-12-02 ENCOUNTER — Telehealth: Payer: Self-pay | Admitting: Cardiovascular Disease

## 2017-12-02 NOTE — Telephone Encounter (Signed)
Patient called, requesting appt with Dr. Gwenlyn Found for bruising and red spots on arms.

## 2017-12-02 NOTE — Telephone Encounter (Signed)
Returned the call to the patient. She already has an appointment made with Dr. Gwenlyn Found on 3/6. She stated that recently she has started having bruises on her legs, arms and some on her face when she wakes up in the morning. They are not painful. She is concerned that her blood may be too thin from the plavix and aspirin combination.

## 2017-12-06 ENCOUNTER — Other Ambulatory Visit: Payer: Self-pay | Admitting: Cardiovascular Disease

## 2017-12-06 MED ORDER — OMEGA-3-ACID ETHYL ESTERS 1 G PO CAPS: 1.0000 | ORAL_CAPSULE | Freq: Four times a day (QID) | ORAL | 4 refills | Status: DC

## 2017-12-06 NOTE — Telephone Encounter (Signed)
°*  STAT* If patient is at the pharmacy, call can be transferred to refill team.   1. Which medications need to be refilled? (please list name of each medication and dose if known) Omega 3  2. Which pharmacy/location (including street and city if local pharmacy) [facility] [place]  3. Do they need a 30 day or 90 day supply?[place]

## 2017-12-07 ENCOUNTER — Ambulatory Visit: Payer: Medicare HMO | Admitting: Cardiovascular Disease

## 2017-12-07 ENCOUNTER — Encounter: Payer: Self-pay | Admitting: Cardiovascular Disease

## 2017-12-07 DIAGNOSIS — Z951 Presence of aortocoronary bypass graft: Secondary | ICD-10-CM

## 2017-12-07 DIAGNOSIS — I482 Chronic atrial fibrillation, unspecified: Secondary | ICD-10-CM

## 2017-12-07 MED ORDER — METOPROLOL SUCCINATE ER 25 MG PO TB24: 25.0000 mg | ORAL_TABLET | Freq: Every day | ORAL | 11 refills | Status: DC

## 2017-12-07 MED ORDER — APIXABAN 5 MG PO TABS: 5.0000 mg | ORAL_TABLET | Freq: Two times a day (BID) | ORAL | 6 refills | Status: DC

## 2017-12-07 NOTE — Addendum Note (Signed)
Addended by: Therisa Doyne on: 12/07/2017 10:43 AM   Modules accepted: Orders

## 2017-12-07 NOTE — Assessment & Plan Note (Signed)
Kristina Gibbs is in A. fib today with a ventricular response of 99. She is unaware of this. It's unclear how long she has been in A. fib. I'm going to begin her on oral anticoagulant today and stop her Plavix

## 2017-12-07 NOTE — Assessment & Plan Note (Signed)
History of CAD status post coronary artery bypass grafting by Dr. Roxan Hockey March 2000 with a LIMA to the LAD, vein to marginal branch the circumflex and distal right coronary artery. She did have a negative Myoview stress test in June of last year during an admission for chest pain that has been a symptomatic since.

## 2017-12-07 NOTE — Assessment & Plan Note (Signed)
History of essential hypertension blood pressure measured 144/80. She is on amlodipine, Avapro and metoprolol. I am going to keep continue her current medications although increase her metoprolol for heart rate control

## 2017-12-07 NOTE — Patient Instructions (Addendum)
Medication Instructions: Your physician recommends that you continue on your current medications as directed. Please refer to the Current Medication list given to you today. Discontinue Plavix and Clopidogrel  Start Eliquis  Increase Metoprolol to 25 mg daily.   Follow-Up: We request that you follow-up in: 1 month with an extender and in 12 months with Dr Andria Rhein will receive a reminder letter in the mail two months in advance. If you don't receive a letter, please call our office to schedule the follow-up appointment.  If you need a refill on your cardiac medications before your next appointment, please call your pharmacy.

## 2017-12-07 NOTE — Assessment & Plan Note (Signed)
History of hyperlipidemia on statin therapy with lipid profile performed 04/19/17 revealing LDL 67 and an HDL of 77.

## 2017-12-07 NOTE — Progress Notes (Signed)
12/07/2017 Kristina Gibbs   04-06-1946  [id number]  Primary Physician Timmothy Euler, MD Primary Cardiologist: [name] MD FACP, [place], [place], [place]  HPI:  CAMRIN LAPRE is a 72 y.o.  mildly overweight Caucasian female with a history of CAD previously taken care of by Dr. Rollene Fare. I last saw her in the office   04/12/17. She has a history of anterior wall myocardial infarction in 1995 with subsequent percutaneous revascularization to her RCA and LAD. She ultimately requiredmultivessel bypass surgery by Dr. Roxan Hockey March of 2000 and LIMA to LAD, vein to a marginal branch of the circumflex and distal right coronary artery. Problems included hypertension and hyperlipidemia. I performed cardiac catheterization on her in 2010 revealing occluded vein grafts, patent LIMA and normal LV function. Since I saw her in the office year ago she's remained medically stable with only a few episodes of nitrate responsive chest pain. She was recently hospitalized overnight for chest pain 03/05/17. She ruled out for myocardial infarction. She was seen in consultation by Dr. Jacinta Shoe  recommended a Myoview stress test that was performed. This showed no ischemic ischemia. She's had no recurrent chest pain. Since I saw her in the office 9 months ago she's remained currently stable. She has noticed her heart racing on occasion but denies chest pain or shortness of breath.     Current Meds  Medication Sig  . alendronate (FOSAMAX) 70 MG tablet TAKE 1 TABLET BY MOUTH ONCE A WEEK. TAKE WITH A FULL GLASS OF WATER ON AN EMPTY STOMACH.  Marland Kitchen AMITIZA 24 MCG capsule TAKE 1 CAPSULE BY MOUTH TWO TIMES DAILY WITH MEALS  . amLODipine (NORVASC) 5 MG tablet TAKE 1 TABLET BY MOUTH EVERY DAY  . aspirin EC 81 MG tablet Take 81 mg by mouth daily.  Marland Kitchen b complex vitamins tablet Take 1 tablet by mouth daily.   . benzonatate (TESSALON) 200 MG capsule Take 1 capsule (200 mg total) by mouth 3 (three) times daily  as needed for cough.  . calcium carbonate (OS-CAL) 600 MG TABS Take 600 mg by mouth daily.  . cholecalciferol (VITAMIN D) 1000 UNITS tablet Take 1,000 Units by mouth daily.  . CO ENZYME Q-10 PO Take 1 capsule by mouth daily.   . CRESTOR 20 MG tablet Take 1 tablet (20 mg total) by mouth daily.  . irbesartan (AVAPRO) 75 MG tablet TAKE 1 TABLET (75 MG TOTAL) BY MOUTH AT BEDTIME.  . isosorbide mononitrate (IMDUR) 60 MG 24 hr tablet TAKE 1 TABLET BY MOUTH DAILY (Patient taking differently: Take 60 mg by mouth in the morning)  . metoprolol succinate (TOPROL-XL) 25 MG 24 hr tablet Take 1 tablet (25 mg total) by mouth daily.  . nitroGLYCERIN (NITROLINGUAL) 0.4 MG/SPRAY spray Place 1 spray under the tongue every 5 (five) minutes as needed for chest pain.  Marland Kitchen omega-3 acid ethyl esters (LOVAZA) 1 g capsule Take 1 capsule (1 g total) by mouth 4 (four) times daily.  . ondansetron (ZOFRAN ODT) 4 MG disintegrating tablet Take 1 tablet (4 mg total) by mouth every 8 (eight) hours as needed for nausea or vomiting.  . pantoprazole (PROTONIX) 40 MG tablet TAKE 1 TABLET BY MOUTH EVERY DAY  . PATADAY 0.2 % SOLN Place 1 drop into both eyes 2 (two) times daily.   . ranolazine (RANEXA) 500 MG 12 hr tablet TAKE 1 TABLET (500 MG TOTAL) BY MOUTH 2 (TWO) TIMES DAILY.  . vitamin C (ASCORBIC ACID) 500 MG tablet Take  1,000 mg by mouth daily.   . [DISCONTINUED] clopidogrel (PLAVIX) 75 MG tablet TAKE 1 TABLET BY MOUTH DAILY  . [DISCONTINUED] metoprolol succinate (TOPROL-XL) 25 MG 24 hr tablet TAKE 1/2 TABLET BY MOUTH DAILY     Allergies  Allergen Reactions  . Lactose Intolerance (Gi) Nausea And Vomiting and Nausea Only  . Codeine Nausea And Vomiting  . Hydrocortisone Itching    Social History   Socioeconomic History  . Marital status: Married    Spouse name: [name]   . Number of children: 3  . Years of education: 58  . Highest education level: Not on file  Social Needs  . Financial resource strain: Not on file  .  Food insecurity - worry: Not on file  . Food insecurity - inability: Not on file  . Transportation needs - medical: Not on file  . Transportation needs - non-medical: Not on file  Occupational History  . Occupation: Retired  Tobacco Use  . Smoking status: Former Smoker    Packs/day: 0.50    Types: Cigarettes    Last attempt to quit: 10/04/2009    Years since quitting: 8.1  . Smokeless tobacco: Never Used  Substance and Sexual Activity  . Alcohol use: Yes    Alcohol/week: 0.6 oz    Types: 1 Glasses of wine per week    Comment: red wine once per month  . Drug use: No  . Sexual activity: Not Currently    Birth control/protection: Surgical, Post-menopausal  Other Topics Concern  . Not on file  Social History Narrative   Lives w/ husband   Caffeine use: 3 cups coffee/day     Review of Systems: General: negative for chills, fever, night sweats or weight changes.  Cardiovascular: negative for chest pain, dyspnea on exertion, edema, orthopnea, palpitations, paroxysmal nocturnal dyspnea or shortness of breath Dermatological: negative for rash Respiratory: negative for cough or wheezing Urologic: negative for hematuria Abdominal: negative for nausea, vomiting, diarrhea, bright red blood per rectum, melena, or hematemesis Neurologic: negative for visual changes, syncope, or dizziness All other systems reviewed and are otherwise negative except as noted above.    Blood pressure (!) 144/80, pulse 99, height 5\' 2"  (1.575 m), weight 161 lb 6.4 oz (73.2 kg).  General appearance: alert and no distress Neck: no adenopathy, no carotid bruit, no JVD, supple, symmetrical, trachea midline and thyroid not enlarged, symmetric, no tenderness/mass/nodules Lungs: clear to auscultation bilaterally Heart: irregularly irregular rhythm Extremities: extremities normal, atraumatic, no cyanosis or edema Pulses: 2+ and symmetric Skin: Skin color, texture, turgor normal. No rashes or lesions Neurologic:  Alert and oriented X 3, normal strength and tone. Normal symmetric reflexes. Normal coordination and gait  EKG atrial fibrillation with a ventricular response of 99, septal Q waves. I personally reviewed this EKG.  ASSESSMENT AND PLAN:   Hx of CABG History of CAD status post coronary artery bypass grafting by Dr. Roxan Hockey March 2000 with a LIMA to the LAD, vein to marginal branch the circumflex and distal right coronary artery. She did have a negative Myoview stress test in June of last year during an admission for chest pain that has been a symptomatic since.  HLD (hyperlipidemia) History of hyperlipidemia on statin therapy with lipid profile performed 04/19/17 revealing LDL 67 and an HDL of 77.  Hypertension History of essential hypertension blood pressure measured 144/80. She is on amlodipine, Avapro and metoprolol. I am going to keep continue her current medications although increase her metoprolol for heart rate control  Chronic atrial fibrillation ([place]) Ms. Dubeau is in A. fib today with a ventricular response of 99. She is unaware of this. It's unclear how long she has been in A. fib. I'm going to begin her on oral anticoagulant today and stop her Plavix      [name] MD [facility], [facility] 12/07/2017 10:34 AM

## 2018-01-02 ENCOUNTER — Other Ambulatory Visit: Payer: Self-pay | Admitting: Family Medicine

## 2018-01-03 ENCOUNTER — Other Ambulatory Visit: Payer: Self-pay | Admitting: Cardiovascular Disease

## 2018-01-03 MED ORDER — APIXABAN 5 MG PO TABS: 5.0000 mg | ORAL_TABLET | Freq: Two times a day (BID) | ORAL | 1 refills | Status: DC

## 2018-01-03 NOTE — Telephone Encounter (Signed)
New message      *STAT* If patient is at the pharmacy, call can be transferred to refill team.   1. Which medications need to be refilled? (please list name of each medication and dose if known)    apixaban (ELIQUIS) 5 MG TABS tablet Take 1 tablet (5 mg total) by mouth 2 (two) times daily.        2. Which pharmacy/location (including street and city if local pharmacy) [facility] [place]  3. Do they need a 30 day or 90 day supply?  [place]

## 2018-01-10 ENCOUNTER — Telehealth: Payer: Self-pay | Admitting: Family Medicine

## 2018-01-11 ENCOUNTER — Ambulatory Visit: Payer: Medicare HMO | Admitting: Adult Health

## 2018-01-11 ENCOUNTER — Encounter: Payer: Self-pay | Admitting: Adult Health

## 2018-01-11 VITALS — BP 122/76 | HR 113 | Ht 62.0 in | Wt 158.2 lb

## 2018-01-11 DIAGNOSIS — I10 Essential (primary) hypertension: Secondary | ICD-10-CM

## 2018-01-11 DIAGNOSIS — E7800 Pure hypercholesterolemia, unspecified: Secondary | ICD-10-CM | POA: Diagnosis not present

## 2018-01-11 DIAGNOSIS — I482 Chronic atrial fibrillation, unspecified: Secondary | ICD-10-CM

## 2018-01-11 MED ORDER — METOPROLOL SUCCINATE ER 50 MG PO TB24: 50.0000 mg | ORAL_TABLET | Freq: Every day | ORAL | 2 refills | Status: DC

## 2018-01-11 NOTE — H&P (View-Only) (Signed)
Cardiology Office Note   Date:  01/11/2018   ID:  [name], DOB 1946-07-24, MRN [id number]  PCP:  Kristina Euler, MD  Cardiologist: Dr.  Gwenlyn Gibbs Chief Complaint  Patient presents with  . Follow-up     History of Present Illness: Kristina Gibbs is a 72 y.o. female who presents for ongoing assessment and management of coronary artery disease, history of anterior wall myocardial infarction in 1995 and PCI revascularization of her RCA and LAD.  The patient eventually required multivessel bypass surgery which was completed by Dr. Roxan Gibbs in March 2000.  (LIMA to LAD, SVG to marginal branch of the circumflex and distal right coronary artery.)    Other history includes atrial fibrillation, hypertension and hyperlipidemia.  Most recent cardiac catheterization 2010 revealed occluded vein grafts is patent LIMA and normal LV function.  Follow-up myocardial perfusion study in June 2018 was ordered by Dr. Bronson Gibbs after admission for chest discomfort.  This revealed no ischemia.   The patient was last seen by Dr. Alvester Gibbs on 12/07/2017.  During that visit Plavix was discontinued, and she was started on anticoagulation therapy.  She is here on follow-up for her response to medications and ongoing evaluation.  She states even though medications were adjusted she continues to have periods of rapid heart rhythm, sometimes causing diaphoresis.  She awakens at night often with rapid heart rhythm, "my clothes are soaked and so on the sheets" which is causing her significant distress.  01/23/2016 - Left ventricle: The cavity size was normal. Wall thickness was   normal. Systolic function was normal. The estimated ejection   fraction was in the range of 55% to 60%. Wall motion was normal;   there were no regional wall motion abnormalities. Features are   consistent with a pseudonormal left ventricular filling pattern,   with concomitant abnormal relaxation and increased filling   pressure  (grade 2 diastolic dysfunction). - Aortic valve: Trileaflet; mildly thickened, mildly calcified   leaflets. - Left atrium: The atrium was mildly dilated. - Pulmonary arteries: Systolic pressure was mildly increased. PA   peak pressure: 39 mm Hg (S).   Past Medical History:  Diagnosis Date  . Allergy   . Arthritis   . Cancer (Kristina Gibbs)    ovarian  . Coronary artery disease   . Heart murmur   . Hypercholesteremia   . Hypertension   . 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 Osteopenia   . Stomach problems   . Unstable angina (Kristina Gibbs) 05/27/2014    Past Surgical History:  Procedure Laterality Date  . ABDOMINAL HYSTERECTOMY    . CHOLECYSTECTOMY    . CORONARY ARTERY BYPASS GRAFT    . EYE SURGERY     bilateral cataract extraction  . HAND SURGERY Left    injurred hand at work- Programmer, applications  . hysterectomy    . LEFT HEART CATHETERIZATION WITH CORONARY ANGIOGRAM N/A 05/27/2014   Procedure: LEFT HEART CATHETERIZATION WITH CORONARY ANGIOGRAM;  Surgeon: Kristina Harp, MD;  Location: [facility] CATH LAB;  Service: Cardiovascular;  Laterality: N/A;  . PCTA     x7      Current Outpatient Medications  Medication Sig Dispense Refill  . alendronate (FOSAMAX) 70 MG tablet TAKE 1 TABLET BY MOUTH ONCE A WEEK. TAKE WITH A FULL GLASS OF WATER ON AN EMPTY STOMACH. 12 tablet 1  . AMITIZA 24 MCG capsule TAKE 1 CAPSULE BY MOUTH TWO TIMES DAILY WITH MEALS 180 capsule 1  . amLODipine (NORVASC)  5 MG tablet TAKE 1 TABLET BY MOUTH EVERY DAY 90 tablet 1  . apixaban (ELIQUIS) 5 MG TABS tablet Take 1 tablet (5 mg total) by mouth 2 (two) times daily. 180 tablet 1  . aspirin EC 81 MG tablet Take 81 mg by mouth daily.    Marland Kitchen b complex vitamins tablet Take 1 tablet by mouth daily.     . benzonatate (TESSALON) 200 MG capsule Take 1 capsule (200 mg total) by mouth 3 (three) times daily as needed for cough. 60 capsule 0  . calcium carbonate (OS-CAL) 600 MG TABS Take 600 mg by mouth daily.    . cholecalciferol (VITAMIN D) 1000  UNITS tablet Take 1,000 Units by mouth daily.    . CO ENZYME Q-10 PO Take 1 capsule by mouth daily.     . CRESTOR 20 MG tablet Take 1 tablet (20 mg total) by mouth daily. 30 tablet 6  . irbesartan (AVAPRO) 75 MG tablet TAKE 1 TABLET (75 MG TOTAL) BY MOUTH AT BEDTIME. 30 tablet 9  . isosorbide mononitrate (IMDUR) 60 MG 24 hr tablet TAKE 1 TABLET BY MOUTH DAILY (Patient taking differently: Take 60 mg by mouth in the morning) 30 tablet 10  . metoprolol succinate (TOPROL-XL) 50 MG 24 hr tablet Take 1 tablet (50 mg total) by mouth daily. 30 tablet 2  . nitroGLYCERIN (NITROLINGUAL) 0.4 MG/SPRAY spray Place 1 spray under the tongue every 5 (five) minutes as needed for chest pain. 12 g 3  . omega-3 acid ethyl esters (LOVAZA) 1 g capsule Take 1 capsule (1 g total) by mouth 4 (four) times daily. 120 capsule 4  . ondansetron (ZOFRAN ODT) 4 MG disintegrating tablet Take 1 tablet (4 mg total) by mouth every 8 (eight) hours as needed for nausea or vomiting. 20 tablet 0  . PATADAY 0.2 % SOLN Place 1 drop into both eyes 2 (two) times daily.   6  . ranolazine (RANEXA) 500 MG 12 hr tablet TAKE 1 TABLET (500 MG TOTAL) BY MOUTH 2 (TWO) TIMES DAILY. 60 tablet 6  . vitamin C (ASCORBIC ACID) 500 MG tablet Take 1,000 mg by mouth daily.      No current facility-administered medications for this visit.     Allergies:   Lactose intolerance (gi); Codeine; and Hydrocortisone    Social History:  The patient  reports that she quit smoking about 8 years ago. Her smoking use included cigarettes. She smoked 0.50 packs per day. She has never used smokeless tobacco. She reports that she drinks about 0.6 oz of alcohol per week. She reports that she does not use drugs.   Family History:  The patient's family history includes Breast cancer in her maternal aunt; Dementia in her mother; Diabetes in her mother; Heart disease in her brother and daughter; Hyperlipidemia in her mother; Hypertension in her mother; Kidney disease in her  mother; Stroke in her mother.    ROS: All other systems are reviewed and negative. Unless otherwise mentioned in H&P    PHYSICAL EXAM: VS:  BP 122/76   Pulse (!) 113   Ht 5\' 2"  (1.575 m)   Wt 158 lb 3.2 oz (71.8 kg)   BMI 28.94 kg/m  , BMI Body mass index is 28.94 kg/m. GEN: Well nourished, well developed, in no acute distress  HEENT: normal  Neck: no JVD, carotid bruits, or masses Cardiac: IRRR; tachycardic no murmurs, rubs, or gallops,no edema  Respiratory:  Clear to auscultation bilaterally, normal work of breathing GI: soft, nontender,  nondistended, + BS MS: no deformity or atrophy  Skin: warm and dry, no rash Neuro:  Strength and sensation are intact Psych: euthymic mood, full affect   EKG: Dated 01/11/2018 atrial fibrillation with RVR heart rate of 113 bpm with evidence of septal Q waves.   Recent Labs: 04/19/2017: TSH 3.420 06/17/2017: ALT 24 11/15/2017: BUN 16; Creatinine, Ser 1.12; Hemoglobin 13.2; Platelets 232; Potassium 4.7; Sodium 142    Lipid Panel    Component Value Date/Time   CHOL 155 04/19/2017 1621   CHOL 166 12/15/2012 1125   TRIG 53 04/19/2017 1621   TRIG 102 12/15/2012 1125   HDL 77 04/19/2017 1621   HDL 68 12/15/2012 1125   CHOLHDL 2.0 04/19/2017 1621   CHOLHDL 1.9 05/27/2014 0315   VLDL 13 05/27/2014 0315   LDLCALC 67 04/19/2017 1621   LDLCALC 78 12/15/2012 1125      Wt Readings from Last 3 Encounters:  01/11/18 158 lb 3.2 oz (71.8 kg)  12/07/17 161 lb 6.4 oz (73.2 kg)  11/15/17 160 lb (72.6 kg)      Other studies Reviewed:  NM Stress Test 03/06/2017  There was no ST segment deviation noted during stress.  No T wave inversion was noted during stress.  The study is normal.  This is a low risk study.  Nuclear stress EF: 61%.   Low risk stress nuclear study with normal perfusion and normal left ventricular regional and global systolic function.  ASSESSMENT AND PLAN:  1.  Atrial fibrillation with RVR: The patient is tolerating  Eliquis 5 mg p.o. twice daily.  There is no evidence of bleeding.  The patient's heart rate remains elevated, and she has been experiencing rapid heart rhythm awaking her at night or sometimes during the day with associated diaphoresis.  She states "it comes and goes, but I feel it very strongly.".  I would like to plan a cardioversion for this patient as she has been on Eliquis for over a month.  I have spoken with Dr. Alvester Gibbs about this who has reviewed her chart and is in agreement that she would be a candidate for cardioversion.  I have spoken to the patient about cardioversion and she would like to speak with her husband about this first before getting it planned.  In the interim, I am going to increase her metoprolol to 50 mg XL daily from 25 mg XL daily.  Hopefully this will keep her heart rate better controlled, and make her more comfortable.  She will call us when she has decided whether or not to proceed with cardioversion.   2.  Chronic chest discomfort: The patient is on isosorbide mononitrate and ranolazine 500 mg twice daily.  She states that her insurance company will no longer pay for Ranexa brand, and wishes her to switch to a different medication possibly generic.  The patient states that she does not tolerate generic medications I would like to stay on brand name.  The patient's insurance company will need to be notified along with a statement stating that the patient will require brand name therapy.  She is going to follow-up in 2 weeks for blood pressure check and heart rate check with pharmacy, at which time she can discuss this further about authorization for brand name.  I will be happy to sign the form or can be signed by Dr. Gwenlyn Gibbs.  I provided her with samples to tide her over until all of this is approved.  Of note, the patient had a nuclear  medicine stress test on 03/06/2017 that was negative for ischemia.  3.Hypertension: Blood pressure is currently well controlled.  With  increased dose of metoprolol we may need to decrease her dose of amlodipine to 2.5 mg or decrease irbesartan to 50 mg daily.  This will be ascertained on follow-up appointment with heart rate and blood pressure check  Current medicines are reviewed at length with the patient today.    Labs/ tests ordered today include: None Phill Myron. West Pugh, ANP, AACC   01/11/2018 12:06 PM    [facility] [address] [place], [place] [address] Phone: [phone]; Fax: [phone]

## 2018-01-11 NOTE — Progress Notes (Signed)
Cardiology Office Note   Date:  01/11/2018   ID:  Kristina Gibbs, DOB 18-Jan-1946, MRN [id number]  PCP:  Kristina Euler, MD  Cardiologist: Dr.  Gwenlyn Found Chief Complaint  Patient presents with  . Follow-up     History of Present Illness: Kristina Gibbs is a 72 y.o. female who presents for ongoing assessment and management of coronary artery disease, history of anterior wall myocardial infarction in 1995 and PCI revascularization of her RCA and LAD.  The patient eventually required multivessel bypass surgery which was completed by Dr. Roxan Gibbs in March 2000.  (LIMA to LAD, SVG to marginal branch of the circumflex and distal right coronary artery.)    Other history includes atrial fibrillation, hypertension and hyperlipidemia.  Most recent cardiac catheterization 2010 revealed occluded vein grafts is patent LIMA and normal LV function.  Follow-up myocardial perfusion study in June 2018 was ordered by Dr. Bronson Gibbs after admission for chest discomfort.  This revealed no ischemia.   The patient was last seen by Dr. Alvester Gibbs on 12/07/2017.  During that visit Plavix was discontinued, and she was started on anticoagulation therapy.  She is here on follow-up for her response to medications and ongoing evaluation.  She states even though medications were adjusted she continues to have periods of rapid heart rhythm, sometimes causing diaphoresis.  She awakens at night often with rapid heart rhythm, "my clothes are soaked and so on the sheets" which is causing her significant distress.  01/23/2016 - Left ventricle: The cavity size was normal. Wall thickness was   normal. Systolic function was normal. The estimated ejection   fraction was in the range of 55% to 60%. Wall motion was normal;   there were no regional wall motion abnormalities. Features are   consistent with a pseudonormal left ventricular filling pattern,   with concomitant abnormal relaxation and increased filling   pressure  (grade 2 diastolic dysfunction). - Aortic valve: Trileaflet; mildly thickened, mildly calcified   leaflets. - Left atrium: The atrium was mildly dilated. - Pulmonary arteries: Systolic pressure was mildly increased. PA   peak pressure: 39 mm Hg (S).   Past Medical History:  Diagnosis Date  . Allergy   . Arthritis   . Cancer ([place])    ovarian  . Coronary artery disease   . Heart murmur   . Hypercholesteremia   . Hypertension   . 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 Osteopenia   . Stomach problems   . Unstable angina ([place]) 05/27/2014    Past Surgical History:  Procedure Laterality Date  . ABDOMINAL HYSTERECTOMY    . CHOLECYSTECTOMY    . CORONARY ARTERY BYPASS GRAFT    . EYE SURGERY     bilateral cataract extraction  . HAND SURGERY Left    injurred hand at work- Programmer, applications  . hysterectomy    . LEFT HEART CATHETERIZATION WITH CORONARY ANGIOGRAM N/A 05/27/2014   Procedure: LEFT HEART CATHETERIZATION WITH CORONARY ANGIOGRAM;  Surgeon: Kristina Harp, MD;  Location: [facility] CATH LAB;  Service: Cardiovascular;  Laterality: N/A;  . PCTA     x7      Current Outpatient Medications  Medication Sig Dispense Refill  . alendronate (FOSAMAX) 70 MG tablet TAKE 1 TABLET BY MOUTH ONCE A WEEK. TAKE WITH A FULL GLASS OF WATER ON AN EMPTY STOMACH. 12 tablet 1  . AMITIZA 24 MCG capsule TAKE 1 CAPSULE BY MOUTH TWO TIMES DAILY WITH MEALS 180 capsule 1  . amLODipine (NORVASC)  5 MG tablet TAKE 1 TABLET BY MOUTH EVERY DAY 90 tablet 1  . apixaban (ELIQUIS) 5 MG TABS tablet Take 1 tablet (5 mg total) by mouth 2 (two) times daily. 180 tablet 1  . aspirin EC 81 MG tablet Take 81 mg by mouth daily.    Marland Kitchen b complex vitamins tablet Take 1 tablet by mouth daily.     . benzonatate (TESSALON) 200 MG capsule Take 1 capsule (200 mg total) by mouth 3 (three) times daily as needed for cough. 60 capsule 0  . calcium carbonate (OS-CAL) 600 MG TABS Take 600 mg by mouth daily.    . cholecalciferol (VITAMIN D) 1000  UNITS tablet Take 1,000 Units by mouth daily.    . CO ENZYME Q-10 PO Take 1 capsule by mouth daily.     . CRESTOR 20 MG tablet Take 1 tablet (20 mg total) by mouth daily. 30 tablet 6  . irbesartan (AVAPRO) 75 MG tablet TAKE 1 TABLET (75 MG TOTAL) BY MOUTH AT BEDTIME. 30 tablet 9  . isosorbide mononitrate (IMDUR) 60 MG 24 hr tablet TAKE 1 TABLET BY MOUTH DAILY (Patient taking differently: Take 60 mg by mouth in the morning) 30 tablet 10  . metoprolol succinate (TOPROL-XL) 50 MG 24 hr tablet Take 1 tablet (50 mg total) by mouth daily. 30 tablet 2  . nitroGLYCERIN (NITROLINGUAL) 0.4 MG/SPRAY spray Place 1 spray under the tongue every 5 (five) minutes as needed for chest pain. 12 g 3  . omega-3 acid ethyl esters (LOVAZA) 1 g capsule Take 1 capsule (1 g total) by mouth 4 (four) times daily. 120 capsule 4  . ondansetron (ZOFRAN ODT) 4 MG disintegrating tablet Take 1 tablet (4 mg total) by mouth every 8 (eight) hours as needed for nausea or vomiting. 20 tablet 0  . PATADAY 0.2 % SOLN Place 1 drop into both eyes 2 (two) times daily.   6  . ranolazine (RANEXA) 500 MG 12 hr tablet TAKE 1 TABLET (500 MG TOTAL) BY MOUTH 2 (TWO) TIMES DAILY. 60 tablet 6  . vitamin C (ASCORBIC ACID) 500 MG tablet Take 1,000 mg by mouth daily.      No current facility-administered medications for this visit.     Allergies:   Lactose intolerance (gi); Codeine; and Hydrocortisone    Social History:  The patient  reports that she quit smoking about 8 years ago. Her smoking use included cigarettes. She smoked 0.50 packs per day. She has never used smokeless tobacco. She reports that she drinks about 0.6 oz of alcohol per week. She reports that she does not use drugs.   Family History:  The patient's family history includes Breast cancer in her maternal aunt; Dementia in her mother; Diabetes in her mother; Heart disease in her brother and daughter; Hyperlipidemia in her mother; Hypertension in her mother; Kidney disease in her  mother; Stroke in her mother.    ROS: All other systems are reviewed and negative. Unless otherwise mentioned in H&P    PHYSICAL EXAM: VS:  BP 122/76   Pulse (!) 113   Ht 5\' 2"  (1.575 m)   Wt 158 lb 3.2 oz (71.8 kg)   BMI 28.94 kg/m  , BMI Body mass index is 28.94 kg/m. GEN: Well nourished, well developed, in no acute distress  HEENT: normal  Neck: no JVD, carotid bruits, or masses Cardiac: IRRR; tachycardic no murmurs, rubs, or gallops,no edema  Respiratory:  Clear to auscultation bilaterally, normal work of breathing GI: soft, nontender,  nondistended, + BS MS: no deformity or atrophy  Skin: warm and dry, no rash Neuro:  Strength and sensation are intact Psych: euthymic mood, full affect   EKG: Dated 01/11/2018 atrial fibrillation with RVR heart rate of 113 bpm with evidence of septal Q waves.   Recent Labs: 04/19/2017: TSH 3.420 06/17/2017: ALT 24 11/15/2017: BUN 16; Creatinine, Ser 1.12; Hemoglobin 13.2; Platelets 232; Potassium 4.7; Sodium 142    Lipid Panel    Component Value Date/Time   CHOL 155 04/19/2017 1621   CHOL 166 12/15/2012 1125   TRIG 53 04/19/2017 1621   TRIG 102 12/15/2012 1125   HDL 77 04/19/2017 1621   HDL 68 12/15/2012 1125   CHOLHDL 2.0 04/19/2017 1621   CHOLHDL 1.9 05/27/2014 0315   VLDL 13 05/27/2014 0315   LDLCALC 67 04/19/2017 1621   LDLCALC 78 12/15/2012 1125      Wt Readings from Last 3 Encounters:  01/11/18 158 lb 3.2 oz (71.8 kg)  12/07/17 161 lb 6.4 oz (73.2 kg)  11/15/17 160 lb (72.6 kg)      Other studies Reviewed:  NM Stress Test 03/06/2017  There was no ST segment deviation noted during stress.  No T wave inversion was noted during stress.  The study is normal.  This is a low risk study.  Nuclear stress EF: 61%.   Low risk stress nuclear study with normal perfusion and normal left ventricular regional and global systolic function.  ASSESSMENT AND PLAN:  1.  Atrial fibrillation with RVR: The patient is tolerating  Eliquis 5 mg p.o. twice daily.  There is no evidence of bleeding.  The patient's heart rate remains elevated, and she has been experiencing rapid heart rhythm awaking her at night or sometimes during the day with associated diaphoresis.  She states "it comes and goes, but I feel it very strongly.".  I would like to plan a cardioversion for this patient as she has been on Eliquis for over a month.  I have spoken with Dr. Alvester Gibbs about this who has reviewed her chart and is in agreement that she would be a candidate for cardioversion.  I have spoken to the patient about cardioversion and she would like to speak with her husband about this first before getting it planned.  In the interim, I am going to increase her metoprolol to 50 mg XL daily from 25 mg XL daily.  Hopefully this will keep her heart rate better controlled, and make her more comfortable.  She will call us when she has decided whether or not to proceed with cardioversion.   2.  Chronic chest discomfort: The patient is on isosorbide mononitrate and ranolazine 500 mg twice daily.  She states that her insurance company will no longer pay for Ranexa brand, and wishes her to switch to a different medication possibly generic.  The patient states that she does not tolerate generic medications I would like to stay on brand name.  The patient's insurance company will need to be notified along with a statement stating that the patient will require brand name therapy.  She is going to follow-up in 2 weeks for blood pressure check and heart rate check with pharmacy, at which time she can discuss this further about authorization for brand name.  I will be happy to sign the form or can be signed by Dr. Gwenlyn Found.  I provided her with samples to tide her over until all of this is approved.  Of note, the patient had a nuclear  medicine stress test on 03/06/2017 that was negative for ischemia.  3.Hypertension: Blood pressure is currently well controlled.  With  increased dose of metoprolol we may need to decrease her dose of amlodipine to 2.5 mg or decrease irbesartan to 50 mg daily.  This will be ascertained on follow-up appointment with heart rate and blood pressure check  Current medicines are reviewed at length with the patient today.    Labs/ tests ordered today include: None Phill Myron. [place] Pugh, ANP, AACC   01/11/2018 12:06 PM    [facility] [address] [place], [place] [address] Phone: [phone]; Fax: [phone]

## 2018-01-11 NOTE — Patient Instructions (Signed)
Medication Instructions:  INCREASE METOPROLOL XL 50MG  DAILY  If you need a refill on your cardiac medications before your next appointment, please call your pharmacy.   Special Instructions: SCHEDULE CARDIOVERSION FOR AFIB W/RVR, SYMPTOMATIC  2 WEEK FOLLOW UP WITH PHARMACIST FOR BP AND RANEXA  Follow-Up: Your physician wants you to follow-up in: 3 MONTHS WITH DR Gwenlyn Found   Thank you for choosing [facility] at [facility]!!      Electrical Cardioversion Electrical cardioversion is the delivery of a jolt of electricity to restore a normal rhythm to the heart. A rhythm that is too fast or is not regular keeps the heart from pumping well. In this procedure, sticky patches or metal paddles are placed on the chest to deliver electricity to the heart from a device. This procedure may be done in an emergency if:  There is low or no blood pressure as a result of the heart rhythm.  Normal rhythm must be restored as fast as possible to protect the brain and heart from further damage.  It may save a life.  This procedure may also be done for irregular or fast heart rhythms that are not immediately life-threatening. Tell a health care provider about:  Any allergies you have.  All medicines you are taking, including vitamins, herbs, eye drops, creams, and over-the-counter medicines.  Any problems you or family members have had with anesthetic medicines.  Any blood disorders you have.  Any surgeries you have had.  Any medical conditions you have.  Whether you are pregnant or may be pregnant. What are the risks? Generally, this is a safe procedure. However, problems may occur, including:  Allergic reactions to medicines.  A blood clot that breaks free and travels to other parts of your body.  The possible return of an abnormal heart rhythm within hours or days after the procedure.  Your heart stopping (cardiac arrest). This is rare.  What happens before the  procedure? Medicines  Your health care provider may have you start taking: ? Blood-thinning medicines (anticoagulants) so your blood does not clot as easily. ? Medicines may be given to help stabilize your heart rate and rhythm.  Ask your health care provider about changing or stopping your regular medicines. This is especially important if you are taking diabetes medicines or blood thinners. General instructions  Plan to have someone take you home from the hospital or clinic.  If you will be going home right after the procedure, plan to have someone with you for 24 hours.  Follow instructions from your health care provider about eating or drinking restrictions. What happens during the procedure?  To lower your risk of infection: ? Your health care team will wash or sanitize their hands. ? Your skin will be washed with soap.  An IV tube will be inserted into one of your veins.  You will be given a medicine to help you relax (sedative).  Sticky patches (electrodes) or metal paddles may be placed on your chest.  An electrical shock will be delivered. The procedure may vary among health care providers and hospitals. What happens after the procedure?  Your blood pressure, heart rate, breathing rate, and blood oxygen level will be monitored until the medicines you were given have worn off.  Do not drive for 24 hours if you were given a sedative.  Your heart rhythm will be watched to make sure it does not change. This information is not intended to replace advice given to you by your health care provider.  Make sure you discuss any questions you have with your health care provider. Document Released: 09/10/2002 Document Revised: 05/19/2016 Document Reviewed: 03/26/2016 Elsevier Interactive Patient Education  2017 [employer].

## 2018-01-12 ENCOUNTER — Ambulatory Visit (INDEPENDENT_AMBULATORY_CARE_PROVIDER_SITE_OTHER): Payer: Medicare HMO | Admitting: Family Medicine

## 2018-01-12 ENCOUNTER — Telehealth: Payer: Self-pay | Admitting: Family Medicine

## 2018-01-12 ENCOUNTER — Encounter: Payer: Self-pay | Admitting: Family Medicine

## 2018-01-12 VITALS — BP 116/81 | HR 95 | Temp 97.2°F | Ht 62.0 in | Wt 158.8 lb

## 2018-01-12 DIAGNOSIS — J0110 Acute frontal sinusitis, unspecified: Secondary | ICD-10-CM | POA: Diagnosis not present

## 2018-01-12 DIAGNOSIS — J0100 Acute maxillary sinusitis, unspecified: Secondary | ICD-10-CM

## 2018-01-12 MED ORDER — HYDROCODONE-HOMATROPINE 5-1.5 MG/5ML PO SYRP: 5.0000 mL | ORAL_SOLUTION | Freq: Four times a day (QID) | ORAL | 0 refills | Status: DC | PRN

## 2018-01-12 MED ORDER — BENZONATATE 200 MG PO CAPS: 200.0000 mg | ORAL_CAPSULE | Freq: Three times a day (TID) | ORAL | 0 refills | Status: DC | PRN

## 2018-01-12 MED ORDER — AMOXICILLIN-POT CLAVULANATE 875-125 MG PO TABS: 1.0000 | ORAL_TABLET | Freq: Two times a day (BID) | ORAL | 0 refills | Status: DC

## 2018-01-12 NOTE — Telephone Encounter (Signed)
Pt does not want to take hycodan cough syrup, will send tessalon.   Laroy Apple, MD [facility] 01/12/2018, 5:13 PM

## 2018-01-12 NOTE — Progress Notes (Signed)
   HPI  Patient presents today here with illness.  Patient explains she has had about 1 week of chest congestion, cough, and sinus pain and pressure.  She states that the sinus pain is worse in her frontal sinuses and sometimes radiates to her maxillary sinus.  She is having cough that is worse at night.  PMH: Smoking status noted ROS: Per HPI  Objective: BP 116/81   Pulse 95   Temp (!) 97.2 F (36.2 C) (Oral)   Ht 5\' 2"  (1.575 m)   Wt 158 lb 12.8 oz (72 kg)   BMI 29.04 kg/m  Gen: NAD, alert, cooperative with exam HEENT: NCAT, tenderness to palpation of bilateral frontal sinuses CV: No murmur appreciated, normal rate Resp: CTABL, no wheezes, non-labored Abd: SNTND, BS present, no guarding or organomegaly Ext: No edema, warm Neuro: Alert and oriented, No gross deficits  Assessment and plan:  #Frontal sinusitis Patient symptoms are consistent with acute frontal sinusitis, she is having a severe cough as well, given her Hycodan cough syrup for nighttime cough. Treat with Augmentin Return to clinic with any concerns    Meds ordered this encounter  Medications  . amoxicillin-clavulanate (AUGMENTIN) 875-125 MG tablet    Sig: Take 1 tablet by mouth 2 (two) times daily.    Dispense:  20 tablet    Refill:  0    Laroy Apple, MD [facility] 01/12/2018, 10:23 AM

## 2018-01-12 NOTE — Patient Instructions (Signed)
Great to see you!   Sinusitis, Adult Sinusitis is soreness and inflammation of your sinuses. Sinuses are hollow spaces in the bones around your face. They are located:  Around your eyes.  In the middle of your forehead.  Behind your nose.  In your cheekbones.  Your sinuses and nasal passages are lined with a stringy fluid (mucus). Mucus normally drains out of your sinuses. When your nasal tissues get inflamed or swollen, the mucus can get trapped or blocked so air cannot flow through your sinuses. This lets bacteria, viruses, and funguses grow, and that leads to infection. Follow these instructions at home: Medicines  Take, use, or apply over-the-counter and prescription medicines only as told by your doctor. These may include nasal sprays.  If you were prescribed an antibiotic medicine, take it as told by your doctor. Do not stop taking the antibiotic even if you start to feel better. Hydrate and Humidify  Drink enough water to keep your pee (urine) clear or pale yellow.  Use a cool mist humidifier to keep the humidity level in your home above 50%.  Breathe in steam for 10-15 minutes, 3-4 times a day or as told by your doctor. You can do this in the bathroom while a hot shower is running.  Try not to spend time in cool or dry air. Rest  Rest as much as possible.  Sleep with your head raised (elevated).  Make sure to get enough sleep each night. General instructions  Put a warm, moist washcloth on your face 3-4 times a day or as told by your doctor. This will help with discomfort.  Wash your hands often with soap and water. If there is no soap and water, use hand sanitizer.  Do not smoke. Avoid being around people who are smoking (secondhand smoke).  Keep all follow-up visits as told by your doctor. This is important. Contact a doctor if:  You have a fever.  Your symptoms get worse.  Your symptoms do not get better within 10 days. Get help right away if:  You  have a very bad headache.  You cannot stop throwing up (vomiting).  You have pain or swelling around your face or eyes.  You have trouble seeing.  You feel confused.  Your neck is stiff.  You have trouble breathing. This information is not intended to replace advice given to you by your health care provider. Make sure you discuss any questions you have with your health care provider. Document Released: 03/08/2008 Document Revised: 05/16/2016 Document Reviewed: 07/16/2015 Elsevier Interactive Patient Education  2018 Elsevier Inc.  

## 2018-01-13 NOTE — Telephone Encounter (Signed)
Pt aware.

## 2018-01-16 ENCOUNTER — Telehealth: Payer: Self-pay | Admitting: Adult Health

## 2018-01-16 NOTE — Telephone Encounter (Signed)
Please schedule this procedure. Office note is in from last week. She is to continue to monitor for bleeding. If worse she should notify us or PCP

## 2018-01-16 NOTE — Telephone Encounter (Signed)
Returned the call to the patient. She stated that at her last office visit it was recommended that she have a cardioversion. She has decided to go ahead and have this done.   Patient also stated that she had a "few drops" of blood from her rectum today. She stated that it was not a lot and has since stopped. She was concerned because she is on Eliquis. She has been advised to keep track of this and if she has continuous bleeding then she should call back.  Message has been routed to the provider.

## 2018-01-16 NOTE — Telephone Encounter (Signed)
New Message   Patient is calling back to discuss the cardioversion that was recommended. Please call to discuss.

## 2018-01-16 NOTE — Telephone Encounter (Signed)
Pt states rectal bleeding has stopped and she will call PCP if it starts again, informed pt that PCP will let her know when to let us know

## 2018-01-17 ENCOUNTER — Telehealth: Payer: Self-pay | Admitting: Cardiovascular Disease

## 2018-01-17 ENCOUNTER — Telehealth: Payer: Self-pay | Admitting: Family Medicine

## 2018-01-17 DIAGNOSIS — I482 Chronic atrial fibrillation, unspecified: Secondary | ICD-10-CM

## 2018-01-17 DIAGNOSIS — Z79899 Other long term (current) drug therapy: Secondary | ICD-10-CM

## 2018-01-17 NOTE — Telephone Encounter (Signed)
Patient calling, states that she is supposed to have lab work completed in [place] but she does not have order. Patient would like to discuss this

## 2018-01-17 NOTE — Telephone Encounter (Signed)
PT STATES THAT "SOMEONE" CALLED HER YESTERDAY AND LEFT MESSAGE THAT SHE NEEDED LABS FOR CARDIOVERSION. Informed pt that there are no labs ordered and I know nothing about these labs. She will listen to VM again ans see if there is a phone number/name. Labs entered and pt notified to have lab call if there are any orders.

## 2018-01-17 NOTE — Telephone Encounter (Signed)
Pt calling for lab orders. Reviewed orders but didn't see any new lab orders. Left message to call back and routing to primary nurse to review.

## 2018-01-19 ENCOUNTER — Telehealth: Payer: Self-pay | Admitting: Cardiovascular Disease

## 2018-01-19 NOTE — Telephone Encounter (Signed)
New Message:     Pt states she was supposed to have blood work done in [place] and supposed to have a heart shock on the 24th. Pt states she has not been called to due the labs.

## 2018-01-19 NOTE — Telephone Encounter (Signed)
Spoke with pt, aware lab orders are placed and she will go to [facility] to have drawn.

## 2018-01-23 ENCOUNTER — Other Ambulatory Visit: Payer: Medicare HMO

## 2018-01-23 ENCOUNTER — Telehealth: Payer: Self-pay | Admitting: Cardiovascular Disease

## 2018-01-23 DIAGNOSIS — E875 Hyperkalemia: Secondary | ICD-10-CM | POA: Diagnosis not present

## 2018-01-23 DIAGNOSIS — I482 Chronic atrial fibrillation: Secondary | ICD-10-CM | POA: Diagnosis not present

## 2018-01-23 DIAGNOSIS — N183 Chronic kidney disease, stage 3 unspecified: Secondary | ICD-10-CM

## 2018-01-23 DIAGNOSIS — Z79899 Other long term (current) drug therapy: Secondary | ICD-10-CM | POA: Diagnosis not present

## 2018-01-23 LAB — BASIC METABOLIC PANEL
BUN / CREAT RATIO: 12 (ref 12–28)
BUN: 13 mg/dL (ref 8–27)
CO2: 25 mmol/L (ref 20–29)
CREATININE: 1.13 mg/dL — AB (ref 0.57–1.00)
Calcium: 9.7 mg/dL (ref 8.7–10.3)
Chloride: 103 mmol/L (ref 96–106)
GFR, EST AFRICAN AMERICAN: 56 mL/min/{1.73_m2} — AB (ref >59)
GFR, EST NON AFRICAN AMERICAN: 48 mL/min/{1.73_m2} — AB (ref >59)
Glucose: 103 mg/dL — ABNORMAL HIGH (ref 65–99)
POTASSIUM: 5.2 mmol/L (ref 3.5–5.2)
SODIUM: 143 mmol/L (ref 134–144)

## 2018-01-23 NOTE — Telephone Encounter (Signed)
Spoke with Pt who reports she is scheduled for a Cardioversion this Wednesday and had labs drawn today. Pt states she was told she needed specific labs before cardioversion so they drew extra blood and will await orders. Routing to Dr. Gwenlyn Found primary nurse for clarification.

## 2018-01-23 NOTE — Telephone Encounter (Signed)
Left message to call back  

## 2018-01-23 NOTE — Telephone Encounter (Signed)
Spoke to pt and called PCP office [place]. Pt stated she had blood work drawn today and they drew extra blood so she would not have to come back. The lab stated they needed lab order requisition forms with bar code to be able to test blood.   Lab orders faxed to PCP office at [phone].

## 2018-01-23 NOTE — Telephone Encounter (Signed)
New Message:      Pt is calling and states she had labs done is [place] today. Pt states she is due to have a heart shock over at the hospital on Wednesday. Pt states they took extra blood work today but someone needs to call and tell them the order or the hospital will not have what they are supposed to have for Wednesday. [facility] fax [phone] and phone [phone].

## 2018-01-24 ENCOUNTER — Other Ambulatory Visit: Payer: Self-pay | Admitting: Cardiology

## 2018-01-24 ENCOUNTER — Other Ambulatory Visit: Payer: Self-pay | Admitting: Cardiovascular Disease

## 2018-01-24 ENCOUNTER — Ambulatory Visit: Payer: Medicare HMO

## 2018-01-24 LAB — CBC
HEMOGLOBIN: 13.4 g/dL (ref 11.1–15.9)
Hematocrit: 41.1 % (ref 34.0–46.6)
MCH: 28.8 pg (ref 26.6–33.0)
MCHC: 32.6 g/dL (ref 31.5–35.7)
MCV: 88 fL (ref 79–97)
PLATELETS: 278 10*3/uL (ref 150–379)
RBC: 4.65 x10E6/uL (ref 3.77–5.28)
RDW: 14.4 % (ref 12.3–15.4)
WBC: 6.3 10*3/uL (ref 3.4–10.8)

## 2018-01-25 ENCOUNTER — Ambulatory Visit (HOSPITAL_COMMUNITY): Payer: Medicare HMO | Admitting: Anesthesiology

## 2018-01-25 ENCOUNTER — Encounter (HOSPITAL_COMMUNITY): Payer: Self-pay | Admitting: *Deleted

## 2018-01-25 ENCOUNTER — Encounter (HOSPITAL_COMMUNITY)
Admission: RE | Disposition: A | Discharge status: Home / Self Care | Payer: Self-pay | Source: Ambulatory Visit | Attending: Cardiovascular Disease

## 2018-01-25 ENCOUNTER — Ambulatory Visit (HOSPITAL_COMMUNITY)
Admission: RE | Admit: 2018-01-25 | Discharge: 2018-01-25 | Disposition: A | Discharge status: Home / Self Care | Payer: Medicare HMO | Source: Ambulatory Visit | Attending: Cardiovascular Disease | Admitting: Cardiovascular Disease

## 2018-01-25 DIAGNOSIS — Z9841 Cataract extraction status, right eye: Secondary | ICD-10-CM | POA: Diagnosis not present

## 2018-01-25 DIAGNOSIS — N183 Chronic kidney disease, stage 3 (moderate): Secondary | ICD-10-CM | POA: Diagnosis not present

## 2018-01-25 DIAGNOSIS — E7800 Pure hypercholesterolemia, unspecified: Secondary | ICD-10-CM | POA: Diagnosis not present

## 2018-01-25 DIAGNOSIS — Z833 Family history of diabetes mellitus: Secondary | ICD-10-CM | POA: Diagnosis not present

## 2018-01-25 DIAGNOSIS — Z90710 Acquired absence of both cervix and uterus: Secondary | ICD-10-CM | POA: Insufficient documentation

## 2018-01-25 DIAGNOSIS — Z79899 Other long term (current) drug therapy: Secondary | ICD-10-CM | POA: Diagnosis not present

## 2018-01-25 DIAGNOSIS — Z87891 Personal history of nicotine dependence: Secondary | ICD-10-CM | POA: Insufficient documentation

## 2018-01-25 DIAGNOSIS — I4891 Unspecified atrial fibrillation: Secondary | ICD-10-CM | POA: Insufficient documentation

## 2018-01-25 DIAGNOSIS — Z9049 Acquired absence of other specified parts of digestive tract: Secondary | ICD-10-CM | POA: Diagnosis not present

## 2018-01-25 DIAGNOSIS — M1990 Unspecified osteoarthritis, unspecified site: Secondary | ICD-10-CM | POA: Insufficient documentation

## 2018-01-25 DIAGNOSIS — I252 Old myocardial infarction: Secondary | ICD-10-CM | POA: Diagnosis not present

## 2018-01-25 DIAGNOSIS — Z7982 Long term (current) use of aspirin: Secondary | ICD-10-CM | POA: Insufficient documentation

## 2018-01-25 DIAGNOSIS — Z8543 Personal history of malignant neoplasm of ovary: Secondary | ICD-10-CM | POA: Diagnosis not present

## 2018-01-25 DIAGNOSIS — I481 Persistent atrial fibrillation: Secondary | ICD-10-CM

## 2018-01-25 DIAGNOSIS — Z9842 Cataract extraction status, left eye: Secondary | ICD-10-CM | POA: Diagnosis not present

## 2018-01-25 DIAGNOSIS — E739 Lactose intolerance, unspecified: Secondary | ICD-10-CM | POA: Diagnosis not present

## 2018-01-25 DIAGNOSIS — Z8249 Family history of ischemic heart disease and other diseases of the circulatory system: Secondary | ICD-10-CM | POA: Diagnosis not present

## 2018-01-25 DIAGNOSIS — Z885 Allergy status to narcotic agent status: Secondary | ICD-10-CM | POA: Diagnosis not present

## 2018-01-25 DIAGNOSIS — Z803 Family history of malignant neoplasm of breast: Secondary | ICD-10-CM | POA: Insufficient documentation

## 2018-01-25 DIAGNOSIS — I2510 Atherosclerotic heart disease of native coronary artery without angina pectoris: Secondary | ICD-10-CM | POA: Diagnosis not present

## 2018-01-25 DIAGNOSIS — Z7901 Long term (current) use of anticoagulants: Secondary | ICD-10-CM | POA: Insufficient documentation

## 2018-01-25 DIAGNOSIS — Z888 Allergy status to other drugs, medicaments and biological substances status: Secondary | ICD-10-CM | POA: Insufficient documentation

## 2018-01-25 DIAGNOSIS — I4819 Other persistent atrial fibrillation: Secondary | ICD-10-CM

## 2018-01-25 DIAGNOSIS — Z823 Family history of stroke: Secondary | ICD-10-CM | POA: Insufficient documentation

## 2018-01-25 DIAGNOSIS — I129 Hypertensive chronic kidney disease with stage 1 through stage 4 chronic kidney disease, or unspecified chronic kidney disease: Secondary | ICD-10-CM | POA: Diagnosis not present

## 2018-01-25 DIAGNOSIS — Z955 Presence of coronary angioplasty implant and graft: Secondary | ICD-10-CM | POA: Diagnosis not present

## 2018-01-25 DIAGNOSIS — Z951 Presence of aortocoronary bypass graft: Secondary | ICD-10-CM | POA: Diagnosis not present

## 2018-01-25 DIAGNOSIS — R001 Bradycardia, unspecified: Secondary | ICD-10-CM | POA: Diagnosis not present

## 2018-01-25 DIAGNOSIS — I25709 Atherosclerosis of coronary artery bypass graft(s), unspecified, with unspecified angina pectoris: Secondary | ICD-10-CM | POA: Diagnosis not present

## 2018-01-25 HISTORY — PX: CARDIOVERSION: SHX1299

## 2018-01-25 SURGERY — CARDIOVERSION
Anesthesia: General

## 2018-01-25 MED ORDER — PROPOFOL 10 MG/ML IV BOLUS
INTRAVENOUS | Status: AC
  Filled 2018-01-25: qty 20

## 2018-01-25 MED ORDER — LIDOCAINE 2% (20 MG/ML) 5 ML SYRINGE
INTRAMUSCULAR | Status: DC | PRN
  Administered 2018-01-25: 60 mg via INTRAVENOUS

## 2018-01-25 MED ORDER — METOPROLOL SUCCINATE ER 25 MG PO TB24: 25.0000 mg | ORAL_TABLET | Freq: Every day | ORAL | 11 refills | Status: DC

## 2018-01-25 MED ORDER — SODIUM CHLORIDE 0.9 % IV SOLN
INTRAVENOUS | Status: DC
  Administered 2018-01-25: 11:00:00 via INTRAVENOUS

## 2018-01-25 MED ORDER — PROPOFOL 10 MG/ML IV BOLUS
INTRAVENOUS | Status: DC | PRN
  Administered 2018-01-25: 60 mg via INTRAVENOUS

## 2018-01-25 NOTE — CV Procedure (Signed)
    Cardioversion Note  RICHA SHOR [id number] 12-01-1945  Procedure: DC Cardioversion Indications: Atrial fib   Procedure Details Consent: Obtained Time Out: Verified patient identification, verified procedure, site/side was marked, verified correct patient position, special equipment/implants available, Radiology Safety Procedures followed,  medications/allergies/relevent history reviewed, required imaging and test results available.  Performed  The patient has been on adequate anticoagulation.  The patient received IV Lidocaine 60 mg followed by Propofol 60 mg  for sedation.  Synchronous cardioversion was performed at 120  joules.  The cardioversion was successful   She had sinus bradycardia after cardioversion Will reduce the dose of her Toprol XL      Complications: No apparent complications Patient did tolerate procedure well.   Thayer Headings, Brooke Bonito., MD, [facility] [place] 01/25/2018, 11:30 AM

## 2018-01-25 NOTE — Anesthesia Postprocedure Evaluation (Signed)
Anesthesia Post Note  Patient: Kristina Gibbs  Procedure(s) Performed: CARDIOVERSION (N/A )     Patient location during evaluation: PACU Anesthesia Type: General Level of consciousness: awake and alert Pain management: pain level controlled Vital Signs Assessment: post-procedure vital signs reviewed and stable Respiratory status: spontaneous breathing, nonlabored ventilation and respiratory function stable Cardiovascular status: blood pressure returned to baseline and stable Postop Assessment: no apparent nausea or vomiting Anesthetic complications: no    Last Vitals:  Vitals:   01/25/18 1120 01/25/18 1130  BP: (!) 109/42 (!) 113/48  Pulse: (!) 48 (!) 48  Resp: 18 13  Temp:    SpO2: 96% 99%    Last Pain:  Vitals:   01/25/18 1130  TempSrc:   PainSc: 0-No pain                 Audry Pili

## 2018-01-25 NOTE — Transfer of Care (Signed)
Immediate Anesthesia Transfer of Care Note  Patient: Kristina Gibbs  Procedure(s) Performed: CARDIOVERSION (N/A )  Patient Location: Endoscopy Unit  Anesthesia Type:General  Level of Consciousness: awake, alert  and oriented  Airway & Oxygen Therapy: Patient Spontanous Breathing  Post-op Assessment: Report given to RN and Post -op Vital signs reviewed and stable  Post vital signs: Reviewed and stable  Last Vitals:  Vitals Value Taken Time  BP    Temp    Pulse    Resp    SpO2      Last Pain:  Vitals:   01/25/18 0905  TempSrc: Oral  PainSc: 0-No pain         Complications: No apparent anesthesia complications

## 2018-01-25 NOTE — Interval H&P Note (Signed)
History and Physical Interval Note:  01/25/2018 11:01 AM  Kristina Gibbs  has presented today for surgery, with the diagnosis of AFIB  The various methods of treatment have been discussed with the patient and family. After consideration of risks, benefits and other options for treatment, the patient has consented to  Procedure(s): CARDIOVERSION (N/A) as a surgical intervention .  The patient's history has been reviewed, patient examined, no change in status, stable for surgery.  I have reviewed the patient's chart and labs.  Questions were answered to the patient's satisfaction.     [name]

## 2018-01-25 NOTE — Anesthesia Preprocedure Evaluation (Addendum)
Anesthesia Evaluation  Patient identified by MRN, date of birth, ID band Patient awake    Reviewed: Allergy & Precautions, NPO status , Patient's Chart, lab work & pertinent test results, reviewed documented beta blocker date and time   Airway Mallampati: II  TM Distance: >3 FB Neck ROM: Full    Dental  (+) Edentulous Upper, Edentulous Lower   Pulmonary former smoker,    breath sounds clear to auscultation       Cardiovascular hypertension, Pt. on medications and Pt. on home beta blockers + angina with exertion + CAD and + CABG  + dysrhythmias Atrial Fibrillation  Rhythm:Irregular Rate:Normal  '17 TTE - EF 55% to 60%. Grade 2 diastolic dysfunction. Left atrium was mildly dilated. PASP was mildly increased. PA  peak pressure: 39 mmHg   Neuro/Psych negative neurological ROS  negative psych ROS   GI/Hepatic Neg liver ROS, GERD  Controlled and Medicated,  Endo/Other  negative endocrine ROS  Renal/GU CRFRenal disease  negative genitourinary   Musculoskeletal  (+) Arthritis ,   Abdominal   Peds  Hematology negative hematology ROS (+)   Anesthesia Other Findings Ovarian cancer  Reproductive/Obstetrics                           Anesthesia Physical Anesthesia Plan  ASA: III  Anesthesia Plan: General   Post-op Pain Management:    Induction: Intravenous  PONV Risk Score and Plan: Treatment may vary due to age or medical condition and Propofol infusion  Airway Management Planned: Natural Airway and Mask  Additional Equipment: None  Intra-op Plan:   Post-operative Plan:   Informed Consent: I have reviewed the patients History and Physical, chart, labs and discussed the procedure including the risks, benefits and alternatives for the proposed anesthesia with the patient or authorized representative who has indicated his/her understanding and acceptance.     Plan Discussed with: CRNA and  Anesthesiologist  Anesthesia Plan Comments:         Anesthesia Quick Evaluation

## 2018-01-25 NOTE — Anesthesia Procedure Notes (Signed)
Procedure Name: General with mask airway Date/Time: 01/25/2018 11:06 AM Performed by: Wilburn Cornelia, CRNA Pre-anesthesia Checklist: Patient identified, Timeout performed, Patient being monitored, Suction available and Emergency Drugs available Patient Re-evaluated:Patient Re-evaluated prior to induction Oxygen Delivery Method: Ambu bag Preoxygenation: Pre-oxygenation with 100% oxygen Induction Type: IV induction Ventilation: Mask ventilation without difficulty Placement Confirmation: positive ETCO2 and breath sounds checked- equal and bilateral Dental Injury: Teeth and Oropharynx as per pre-operative assessment

## 2018-01-25 NOTE — Discharge Instructions (Signed)
Electrical Cardioversion, Care After °This sheet gives you information about how to care for yourself after your procedure. Your health care provider may also give you more specific instructions. If you have problems or questions, contact your health care provider. °What can I expect after the procedure? °After the procedure, it is common to have: °· Some redness on the skin where the shocks were given. ° °Follow these instructions at home: °· Do not drive for 24 hours if you were given a medicine to help you relax (sedative). °· Take over-the-counter and prescription medicines only as told by your health care provider. °· Ask your health care provider how to check your pulse. Check it often. °· Rest for 48 hours after the procedure or as told by your health care provider. °· Avoid or limit your caffeine use as told by your health care provider. °Contact a health care provider if: °· You feel like your heart is beating too quickly or your pulse is not regular. °· You have a serious muscle cramp that does not go away. °Get help right away if: °· You have discomfort in your chest. °· You are dizzy or you feel faint. °· You have trouble breathing or you are short of breath. °· Your speech is slurred. °· You have trouble moving an arm or leg on one side of your body. °· Your fingers or toes turn cold or blue. °This information is not intended to replace advice given to you by your health care provider. Make sure you discuss any questions you have with your health care provider. °Document Released: 07/11/2013 Document Revised: 04/23/2016 Document Reviewed: 03/26/2016 °Elsevier Interactive Patient Education © 2018 Elsevier Inc. ° °

## 2018-01-30 ENCOUNTER — Telehealth: Payer: Self-pay | Admitting: Cardiovascular Disease

## 2018-01-30 NOTE — Telephone Encounter (Signed)
Left a message to call back.

## 2018-01-30 NOTE — Telephone Encounter (Signed)
Follow Up:    Pt said her heart was shocked last Wednesday.She says this morning,her heart is only beating at 55.She wants to know if this is normal orgood,she says she really feel weak.

## 2018-01-31 NOTE — Telephone Encounter (Signed)
Spoke with pt and states heart rate yesterday was 55 and when checked later dropped to 50 and this morning heart rate was 62 ,but had just taken Metoprolol  25 mg 20 minutes ago feels weak. Will forward to Dr Gwenlyn Found for review .Adonis Housekeeper

## 2018-02-01 NOTE — Telephone Encounter (Signed)
I'm not worried about that

## 2018-02-14 NOTE — Telephone Encounter (Signed)
Spoke with pt, aware of dr berry's recommendations. 

## 2018-03-16 ENCOUNTER — Ambulatory Visit (INDEPENDENT_AMBULATORY_CARE_PROVIDER_SITE_OTHER): Payer: Medicare HMO | Admitting: Family Medicine

## 2018-03-16 ENCOUNTER — Encounter: Payer: Self-pay | Admitting: Family Medicine

## 2018-03-16 VITALS — BP 131/47 | HR 46 | Temp 97.0°F | Ht 62.0 in | Wt 157.0 lb

## 2018-03-16 DIAGNOSIS — E7800 Pure hypercholesterolemia, unspecified: Secondary | ICD-10-CM | POA: Diagnosis not present

## 2018-03-16 DIAGNOSIS — M7989 Other specified soft tissue disorders: Secondary | ICD-10-CM

## 2018-03-16 DIAGNOSIS — I482 Chronic atrial fibrillation, unspecified: Secondary | ICD-10-CM

## 2018-03-16 MED ORDER — METOPROLOL SUCCINATE ER 25 MG PO TB24: 12.5000 mg | ORAL_TABLET | Freq: Every day | ORAL | 11 refills | Status: DC

## 2018-03-16 NOTE — Patient Instructions (Signed)
Great to see you!  Cut metoprolol in half and take 1/2 pill once daily.   Come back in 3-4 months to see Dr. Lajuana Ripple.

## 2018-03-16 NOTE — Progress Notes (Signed)
   HPI  Patient presents today here with medical conditions and leg swelling.  She explains that over the last month or so she has had more leg swelling.  She is also had a low heart rate and fatigue she states the leg swelling improves with lifting her legs and is better in the morning. She has mild orthopnea occasionally but not persistently.  She is fasting today, she is tolerating Crestor well.  She denies any racing heart. She denies any bleeding.  She has good medication compliance with Eliquis  PMH: Smoking status noted ROS: Per HPI  Objective: BP (!) 131/47   Pulse (!) 46   Temp (!) 97 F (36.1 C) (Oral)   Ht '5\' 2"'$  (1.575 m)   Wt 157 lb (71.2 kg)   BMI 28.72 kg/m  Gen: NAD, alert, cooperative with exam HEENT: NCAT CV: Slow, irregularly irregular, no murmur appreciated Resp: CTABL, no wheezes, non-labored Abd: SNTND, BS present, no guarding or organomegaly Ext: No edema, warm Neuro: Alert and oriented, No gross deficits  Assessment and plan:  #Chronic atrial fibrillation Anticoagulated with Eliquis, heart rate is low today Decrease metoprolol 25 mg to 12.5. Her leg swelling and mild orthopnea that is intermittent could be from slightly decreased cardiac output from occasional or persistent bradycardia No signs of clear volume overload Has follow-up with cardiology in 1 month  #Hyperlipidemia Continue Crestor, labs today  #Leg swelling Likely multifactorial, could be contributed to by typical venous stasis considering her age, amlodipine, low heart rate, possible relation to diastolic CHF with previous grade 2 diastolic dysfunction on echo No clear indication for diuresis at this time Labs Continue Norvasc for now Decrease beta-blocker   Orders Placed This Encounter  Procedures  . CBC with Differential/Platelet  . CMP14+EGFR  . Lipid panel  . TSH    Meds ordered this encounter  Medications  . metoprolol succinate (TOPROL-XL) 25 MG 24 hr tablet   Sig: Take 0.5 tablets (12.5 mg total) by mouth daily.    Dispense:  30 tablet    Refill:  Barron, MD [facility] 03/16/2018, 8:47 AM

## 2018-03-17 LAB — CMP14+EGFR
A/G RATIO: 1.2 (ref 1.2–2.2)
ALK PHOS: 58 IU/L (ref 39–117)
ALT: 20 IU/L (ref 0–32)
AST: 19 IU/L (ref 0–40)
Albumin: 4.2 g/dL (ref 3.5–4.8)
BILIRUBIN TOTAL: 0.7 mg/dL (ref 0.0–1.2)
BUN/Creatinine Ratio: 12 (ref 12–28)
BUN: 12 mg/dL (ref 8–27)
CHLORIDE: 101 mmol/L (ref 96–106)
CO2: 25 mmol/L (ref 20–29)
Calcium: 9.5 mg/dL (ref 8.7–10.3)
Creatinine, Ser: 1.01 mg/dL — ABNORMAL HIGH (ref 0.57–1.00)
GFR calc Af Amer: 64 mL/min/{1.73_m2} (ref >59)
GFR, EST NON AFRICAN AMERICAN: 55 mL/min/{1.73_m2} — AB (ref >59)
GLOBULIN, TOTAL: 3.6 g/dL (ref 1.5–4.5)
Glucose: 98 mg/dL (ref 65–99)
POTASSIUM: 5.1 mmol/L (ref 3.5–5.2)
SODIUM: 140 mmol/L (ref 134–144)
Total Protein: 7.8 g/dL (ref 6.0–8.5)

## 2018-03-17 LAB — LIPID PANEL
CHOL/HDL RATIO: 2.1 ratio (ref 0.0–4.4)
CHOLESTEROL TOTAL: 160 mg/dL (ref 100–199)
HDL: 78 mg/dL (ref >39)
LDL Calculated: 69 mg/dL (ref 0–99)
TRIGLYCERIDES: 67 mg/dL (ref 0–149)
VLDL Cholesterol Cal: 13 mg/dL (ref 5–40)

## 2018-03-17 LAB — CBC WITH DIFFERENTIAL/PLATELET
BASOS: 1 %
Basophils Absolute: 0 10*3/uL (ref 0.0–0.2)
EOS (ABSOLUTE): 0.2 10*3/uL (ref 0.0–0.4)
EOS: 4 %
HEMATOCRIT: 38 % (ref 34.0–46.6)
Hemoglobin: 12.4 g/dL (ref 11.1–15.9)
Immature Grans (Abs): 0 10*3/uL (ref 0.0–0.1)
Immature Granulocytes: 0 %
LYMPHS ABS: 1 10*3/uL (ref 0.7–3.1)
Lymphs: 22 %
MCH: 28.4 pg (ref 26.6–33.0)
MCHC: 32.6 g/dL (ref 31.5–35.7)
MCV: 87 fL (ref 79–97)
MONOS ABS: 0.4 10*3/uL (ref 0.1–0.9)
Monocytes: 9 %
NEUTROS PCT: 64 %
Neutrophils Absolute: 2.9 10*3/uL (ref 1.4–7.0)
PLATELETS: 247 10*3/uL (ref 150–450)
RBC: 4.37 x10E6/uL (ref 3.77–5.28)
RDW: 14.6 % (ref 12.3–15.4)
WBC: 4.5 10*3/uL (ref 3.4–10.8)

## 2018-03-17 LAB — TSH: TSH: 4.58 u[IU]/mL — ABNORMAL HIGH (ref 0.450–4.500)

## 2018-03-22 ENCOUNTER — Other Ambulatory Visit: Payer: Self-pay | Admitting: Cardiovascular Disease

## 2018-03-22 NOTE — Telephone Encounter (Signed)
Rx request sent to pharmacy.  

## 2018-04-04 ENCOUNTER — Encounter: Payer: Self-pay | Admitting: Family Medicine

## 2018-04-04 ENCOUNTER — Ambulatory Visit (INDEPENDENT_AMBULATORY_CARE_PROVIDER_SITE_OTHER): Payer: Medicare HMO | Admitting: Family Medicine

## 2018-04-04 ENCOUNTER — Ambulatory Visit (INDEPENDENT_AMBULATORY_CARE_PROVIDER_SITE_OTHER): Payer: Medicare HMO

## 2018-04-04 VITALS — BP 133/50 | HR 56 | Temp 97.6°F | Ht 62.0 in | Wt 157.4 lb

## 2018-04-04 DIAGNOSIS — M7989 Other specified soft tissue disorders: Secondary | ICD-10-CM

## 2018-04-04 DIAGNOSIS — S9032XA Contusion of left foot, initial encounter: Secondary | ICD-10-CM | POA: Diagnosis not present

## 2018-04-04 NOTE — Progress Notes (Signed)
   HPI  Patient presents today here with bruising and swelling of the left foot.  She states symptoms began 1 to 2 weeks ago.  She woke up with swelling and bruising of the left foot.  She states that it seems to be getting a little bit worse. She denies any discrete injury or concern for injury.  She states that it hurts when she walks barefooted, she does okay in shoes.  She has had slight swelling in the right foot but no discoloration. She has not had issues like this previously  PMH: Smoking status noted ROS: Per HPI  Objective: BP (!) 133/50   Pulse (!) 56   Temp 97.6 F (36.4 C) (Oral)   Ht 5\' 2"  (1.575 m)   Wt 157 lb 6.4 oz (71.4 kg)   BMI 28.79 kg/m  Gen: NAD, alert, cooperative with exam HEENT: NCAT CV: RRR, good S1/S2, no murmur Resp: CTABL, no wheezes, non-labored Ext: No edema, warm Neuro: Alert and oriented, No gross deficits MSK Tenderness over the proximal fourth metatarsal, tenderness with inversion of the foot No joint laxity  Assessment and plan:  #Left foot swelling Unusual bruising and swelling without discrete injury. Plain film pending, doubt fracture Discussed supportive shoes and supportive care. Likely bruising is exacerbated by use of Eliquis. Reassurance provided    Orders Placed This Encounter  Procedures  . DG Foot Complete Left    Standing Status:   Future    Standing Expiration Date:   06/06/2019    Order Specific Question:   Reason for Exam (SYMPTOM  OR DIAGNOSIS REQUIRED)    Answer:   L foot bruisng and swelling    Order Specific Question:   Preferred imaging location?    Answer:   Internal    Order Specific Question:   Radiology Contrast Protocol - do NOT remove file path    Answer:   [email or web address]    Laroy Apple, MD [facility] 04/04/2018, 3:00 PM

## 2018-04-08 ENCOUNTER — Other Ambulatory Visit: Payer: Self-pay | Admitting: Adult Health

## 2018-04-10 NOTE — Telephone Encounter (Signed)
Rx sent to pharmacy   

## 2018-04-12 ENCOUNTER — Encounter: Payer: Self-pay | Admitting: Cardiovascular Disease

## 2018-04-12 ENCOUNTER — Ambulatory Visit: Payer: Medicare HMO | Admitting: Cardiovascular Disease

## 2018-04-12 VITALS — BP 148/63 | HR 57 | Ht 62.0 in | Wt 156.8 lb

## 2018-04-12 DIAGNOSIS — E7800 Pure hypercholesterolemia, unspecified: Secondary | ICD-10-CM

## 2018-04-12 DIAGNOSIS — I482 Chronic atrial fibrillation, unspecified: Secondary | ICD-10-CM

## 2018-04-12 DIAGNOSIS — Z951 Presence of aortocoronary bypass graft: Secondary | ICD-10-CM | POA: Diagnosis not present

## 2018-04-12 DIAGNOSIS — I10 Essential (primary) hypertension: Secondary | ICD-10-CM | POA: Diagnosis not present

## 2018-04-12 NOTE — Assessment & Plan Note (Signed)
History of CAD status post anterior wall microinfarction in 1995 with subsequent percutaneous revascularization of her RCA and LAD she ultimately underwent multivessel CABG by Dr. Roxan Hockey March 2000 with a LIMA to her LAD, to marginal branch of the circumflex distal right coronary artery.  I catheterized her 2010 revealing occluded vein graft with a patent LIMA.  She had a negative Myoview 03/05/2017 in the setting of chest pain.  She does get occasional nitroglycerin responsive chest pain which is not changed in frequency or severity.

## 2018-04-12 NOTE — Assessment & Plan Note (Signed)
History of hyperlipidemia on statin therapy with lipid profile performed 03/16/2018 revealing an LDL of 69 and HDL 78.

## 2018-04-12 NOTE — Assessment & Plan Note (Signed)
History of atrial fibrillation status post DC cardioversion maintaining sinus rhythm on Eliquis oral anticoagulation.

## 2018-04-12 NOTE — Progress Notes (Signed)
04/12/2018 Kristina Gibbs   1946/03/25  [id number]  Primary Physician Timmothy Euler, MD Primary Cardiologist: [name] MD FACP, [place], [place], [place]  HPI:  Kristina Gibbs is a 72 y.o.  mildly overweight Caucasian female with a history of CAD previously taken care of by Dr. Rollene Fare. I last saw her in the office  12/07/2017. She has a history of anterior wall myocardial infarction in 1995 with subsequent percutaneous revascularization to her RCA and LAD. She ultimately requiredmultivessel bypass surgery by Dr. Roxan Hockey March of 2000 and LIMA to LAD, vein to a marginal branch of the circumflex and distal right coronary artery. Problems included hypertension and hyperlipidemia. I performed cardiac catheterization on herin 2010revealing occluded vein grafts, patent LIMA and normal LV function. Since I saw her in the office year ago she's remained medically stable with only a few episodes of nitrateresponsive chest pain. She was recently hospitalized overnight for chest pain 03/05/17. She ruled out for myocardial infarction. She was seen in consultation by Dr. Staci Acosta a Myoview stress test that was performed. This showed no ischemic ischemia. She's had no recurrent chest pain. Since I saw her in the office for months ago she's remained currently stable.  She was in A. fib with RVR when I saw her last and underwent DC cardioversion successfully to sinus rhythm.  She is on Eliquis oral anticoagulation and has noticed irregular heart rate since I saw her.  Her concerns are with bruising probably related to her oral and a coagulation and with subtle ankle edema she gets occasional chest pain which is not changed in frequency or severity.     Current Meds  Medication Sig  . alendronate (FOSAMAX) 70 MG tablet TAKE 1 TABLET BY MOUTH ONCE A WEEK. TAKE WITH A FULL GLASS OF WATER ON AN EMPTY STOMACH. (Patient taking differently: TAKE 70 MG BY MOUTH ONCE A WEEK ON  MONDAY. TAKE WITH A FULL GLASS OF WATER ON AN EMPTY STOMACH.)  . AMITIZA 24 MCG capsule TAKE 1 CAPSULE BY MOUTH TWO TIMES DAILY WITH MEALS (Patient taking differently: Take 24 mcg by mouth once daily)  . amLODipine (NORVASC) 5 MG tablet TAKE 1 TABLET BY MOUTH EVERY DAY (Patient taking differently: TAKE 5 MG BY MOUTH EVERY DAY)  . apixaban (ELIQUIS) 5 MG TABS tablet Take 1 tablet (5 mg total) by mouth 2 (two) times daily.  Marland Kitchen aspirin EC 81 MG tablet Take 81 mg by mouth at bedtime.   Marland Kitchen b complex vitamins tablet Take 1 tablet by mouth daily.   . calcium carbonate (OS-CAL) 600 MG TABS Take 600 mg by mouth daily.  . cholecalciferol (VITAMIN D) 1000 UNITS tablet Take 1,000 Units by mouth daily.  . CO ENZYME Q-10 PO Take 1 capsule by mouth daily.   . CRESTOR 20 MG tablet Take 1 tablet (20 mg total) by mouth daily.  . irbesartan (AVAPRO) 75 MG tablet TAKE 1 TABLET (75 MG TOTAL) BY MOUTH AT BEDTIME.  . isosorbide mononitrate (IMDUR) 60 MG 24 hr tablet TAKE 1 TABLET BY MOUTH DAILY (Patient taking differently: Take 60 mg by mouth in the morning)  . metoprolol succinate (TOPROL-XL) 25 MG 24 hr tablet Take 0.5 tablets (12.5 mg total) by mouth daily.  . nitroGLYCERIN (NITROLINGUAL) 0.4 MG/SPRAY spray Place 1 spray under the tongue every 5 (five) minutes as needed for chest pain.  Marland Kitchen omega-3 acid ethyl esters (LOVAZA) 1 g capsule TAKE 1 CAPSULE (1 G TOTAL) BY MOUTH 4 (FOUR)  TIMES DAILY.  Marland Kitchen PATADAY 0.2 % SOLN Place 1 drop into both eyes 2 (two) times daily.   . ranolazine (RANEXA) 500 MG 12 hr tablet TAKE 1 TABLET (500 MG TOTAL) BY MOUTH 2 (TWO) TIMES DAILY.  . vitamin C (ASCORBIC ACID) 500 MG tablet Take 1,000 mg by mouth daily.      Allergies  Allergen Reactions  . Lactose Intolerance (Gi) Nausea And Vomiting and Nausea Only  . Codeine Nausea And Vomiting  . Hydrocortisone Itching    Social History   Socioeconomic History  . Marital status: Married    Spouse name: [name]   . Number of children: 3  .  Years of education: 73  . Highest education level: Not on file  Occupational History  . Occupation: Retired  Scientific laboratory technician  . Financial resource strain: Not on file  . Food insecurity:    Worry: Not on file    Inability: Not on file  . Transportation needs:    Medical: Not on file    Non-medical: Not on file  Tobacco Use  . Smoking status: Former Smoker    Packs/day: 0.50    Types: Cigarettes    Last attempt to quit: 10/04/2009    Years since quitting: 8.5  . Smokeless tobacco: Never Used  Substance and Sexual Activity  . Alcohol use: Yes    Alcohol/week: 0.6 oz    Types: 1 Glasses of wine per week    Comment: red wine once per month  . Drug use: No  . Sexual activity: Not Currently    Birth control/protection: Surgical, Post-menopausal  Lifestyle  . Physical activity:    Days per week: Not on file    Minutes per session: Not on file  . Stress: Not on file  Relationships  . Social connections:    Talks on phone: Not on file    Gets together: Not on file    Attends religious service: Not on file    Active member of club or organization: Not on file    Attends meetings of clubs or organizations: Not on file    Relationship status: Not on file  . Intimate partner violence:    Fear of current or ex partner: Not on file    Emotionally abused: Not on file    Physically abused: Not on file    Forced sexual activity: Not on file  Other Topics Concern  . Not on file  Social History Narrative   Lives w/ husband   Caffeine use: 3 cups coffee/day     Review of Systems: General: negative for chills, fever, night sweats or weight changes.  Cardiovascular: negative for chest pain, dyspnea on exertion, edema, orthopnea, palpitations, paroxysmal nocturnal dyspnea or shortness of breath Dermatological: negative for rash Respiratory: negative for cough or wheezing Urologic: negative for hematuria Abdominal: negative for nausea, vomiting, diarrhea, bright red blood per rectum,  melena, or hematemesis Neurologic: negative for visual changes, syncope, or dizziness All other systems reviewed and are otherwise negative except as noted above.    Blood pressure (!) 148/63, pulse (!) 57, height 5\' 2"  (1.575 m), weight 156 lb 12.8 oz (71.1 kg).  General appearance: alert and no distress Neck: no adenopathy, no carotid bruit, no JVD, supple, symmetrical, trachea midline and thyroid not enlarged, symmetric, no tenderness/mass/nodules Lungs: clear to auscultation bilaterally Heart: regular rate and rhythm, S1, S2 normal, no murmur, click, rub or gallop Extremities: extremities normal, atraumatic, no cyanosis or edema Pulses: 2+ and symmetric Skin:  Skin color, texture, turgor normal. No rashes or lesions Neurologic: Alert and oriented X 3, normal strength and tone. Normal symmetric reflexes. Normal coordination and gait  EKG sinus bradycardia 57 Q waves.  I personally reviewed this EKG.  ASSESSMENT AND PLAN:   Hx of CABG History of CAD status post anterior wall microinfarction in 1995 with subsequent percutaneous revascularization of her RCA and LAD she ultimately underwent multivessel CABG by Dr. Roxan Hockey March 2000 with a LIMA to her LAD, to marginal branch of the circumflex distal right coronary artery.  I catheterized her 2010 revealing occluded vein graft with a patent LIMA.  She had a negative Myoview 03/05/2017 in the setting of chest pain.  She does get occasional nitroglycerin responsive chest pain which is not changed in frequency or severity.  HLD (hyperlipidemia) History of hyperlipidemia on statin therapy with lipid profile performed 03/16/2018 revealing an LDL of 69 and HDL 78.  Hypertension History of essential hypertension her blood pressure measured today at 148/63.  She is on amlodipine, Avapro and metoprolol.  Continue current meds at current dosing  Chronic atrial fibrillation (HCC) History of atrial fibrillation status post DC cardioversion  maintaining sinus rhythm on Eliquis oral anticoagulation.      [name] MD FACP,FACC,FAHA, [facility] 04/12/2018 11:03 AM

## 2018-04-12 NOTE — Assessment & Plan Note (Signed)
History of essential hypertension her blood pressure measured today at 148/63.  She is on amlodipine, Avapro and metoprolol.  Continue current meds at current dosing

## 2018-04-12 NOTE — Patient Instructions (Signed)
Medication Instructions: Your physician recommends that you continue on your current medications as directed. Please refer to the Current Medication list given to you today.  If you need a refill on your cardiac medications before your next appointment, please call your pharmacy.   Follow-Up: Your physician wants you to follow-up in 6 months with an APP and 12 months with Dr. Gwenlyn Found. You will receive a reminder letter in the mail two months in advance. If you don't receive a letter, please call our office at [phone] to schedule this follow-up appointment.  Thank you for choosing Heart[facility] at [facility]!!

## 2018-04-17 ENCOUNTER — Ambulatory Visit (INDEPENDENT_AMBULATORY_CARE_PROVIDER_SITE_OTHER): Payer: Medicare HMO | Admitting: *Deleted

## 2018-04-17 ENCOUNTER — Encounter: Payer: Self-pay | Admitting: *Deleted

## 2018-04-17 VITALS — BP 147/54 | HR 54 | Ht 62.0 in | Wt 155.0 lb

## 2018-04-17 DIAGNOSIS — Z780 Asymptomatic menopausal state: Secondary | ICD-10-CM

## 2018-04-17 DIAGNOSIS — Z0000 Encounter for general adult medical examination without abnormal findings: Secondary | ICD-10-CM | POA: Diagnosis not present

## 2018-04-17 NOTE — Progress Notes (Signed)
Subjective:   Kristina Gibbs is a 72 y.o. female who presents for Medicare Annual (Subsequent) preventive examination.  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  She enjoys being outdoors on her porch and exercising at the gym at her apartment complex.  She has 3 children, 1 is deceased, one grand child and 2 great grandchildren.  She lives at home with her husband of 27 years.  She attends church regularly.  Kristina Gibbs feels her health is the same this year as it was last year.  She did have one ER visit and hospitalization in the past year due to chest pain.  She has also undergone cardioversion in the past year for atrial fibrilation.  She reports no surgeries in the past year.       Review of Systems:  All negative today        Objective:     Vitals: BP (!) 147/54   Pulse (!) 54   Ht 5\' 2"  (1.575 m)   Wt 155 lb (70.3 kg)   BMI 28.35 kg/m   Body mass index is 28.35 kg/m.  Advanced Directives 04/17/2018 03/05/2017 03/05/2017 06/08/2016 03/25/2015 05/26/2014  Does Patient Have a Medical Advance Directive? No Yes Yes Yes Yes No  Type of Advance Directive - Healthcare Power of [place] -  Does patient want to make changes to medical advance directive? - No - Patient declined - - - -  Copy of [place] in Chart? - No - copy requested - No - copy requested No - copy requested -  Would patient like information on creating a medical advance directive? Yes (MAU/Ambulatory/Procedural Areas - Information given) - - - - Yes - Educational materials given    Tobacco Social History   Tobacco Use  Smoking Status Former Smoker  . Packs/day: 0.50  . Years: 49.00  . Pack years: 24.50  . Types: Cigarettes  . Last attempt to quit: 10/04/2009  . Years since quitting: 8.5  Smokeless Tobacco Never Used     Counseling given:  Not Answered   Clinical Intake:     Pain Score: 0-No pain                 Past Medical History:  Diagnosis Date  . Allergy   . Arthritis   . Cancer ([place])    ovarian  . Coronary artery disease   . Heart murmur   . Hypercholesteremia   . Hypertension   . 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 Osteopenia   . Stomach problems   . Unstable angina ([place]) 05/27/2014   Past Surgical History:  Procedure Laterality Date  . ABDOMINAL HYSTERECTOMY    . CARDIOVERSION N/A 01/25/2018   Procedure: CARDIOVERSION;  Surgeon: Acie Fredrickson Wonda Cheng, MD;  Location: [place];  Service: Cardiovascular;  Laterality: N/A;  . CHOLECYSTECTOMY    . CORONARY ARTERY BYPASS GRAFT    . EYE SURGERY     bilateral cataract extraction  . HAND SURGERY Left    injurred hand at work- Programmer, applications  . hysterectomy    . LEFT HEART CATHETERIZATION WITH CORONARY ANGIOGRAM N/A 05/27/2014   Procedure: LEFT HEART CATHETERIZATION WITH CORONARY ANGIOGRAM;  Surgeon: [name], MD;  Location: [facility] CATH LAB;  Service: Cardiovascular;  Laterality: N/A;  . PCTA     x7  Family History  Problem Relation Age of Onset  . Stroke Mother   . Diabetes Mother   . Kidney disease Mother   . Hypertension Mother   . Hyperlipidemia Mother   . Dementia Mother   . Heart disease Daughter        heart attack x2  . Heart disease Brother        2 heart attacks  . Breast cancer Maternal Aunt   . Colon cancer Neg Hx   . Stomach cancer Neg Hx   . Esophageal cancer Neg Hx   . Rectal cancer Neg Hx   . Liver cancer Neg Hx    Social History   Socioeconomic History  . Marital status: Married    Spouse name: [name]   . Number of children: 3  . Years of education: 76  . Highest education level: Not on file  Occupational History  . Occupation: Retired  Scientific laboratory technician  . Financial resource strain: Not on file  . Food insecurity:    Worry: Not on file    Inability: Not on file  . Transportation needs:    Medical: Not on  file    Non-medical: Not on file  Tobacco Use  . Smoking status: Former Smoker    Packs/day: 0.50    Years: 49.00    Pack years: 24.50    Types: Cigarettes    Last attempt to quit: 10/04/2009    Years since quitting: 8.5  . Smokeless tobacco: Never Used  Substance and Sexual Activity  . Alcohol use: Yes    Alcohol/week: 0.6 oz    Types: 1 Glasses of wine per week    Comment: red wine once per month  . Drug use: No  . Sexual activity: Not Currently    Birth control/protection: Surgical, Post-menopausal  Lifestyle  . Physical activity:    Days per week: Not on file    Minutes per session: Not on file  . Stress: Not on file  Relationships  . Social connections:    Talks on phone: Not on file    Gets together: Not on file    Attends religious service: Not on file    Active member of club or organization: Not on file    Attends meetings of clubs or organizations: Not on file    Relationship status: Not on file  Other Topics Concern  . Not on file  Social History Narrative   Lives w/ husband   Caffeine use: 3 cups coffee/day    Outpatient Encounter Medications as of 04/17/2018  Medication Sig  . alendronate (FOSAMAX) 70 MG tablet TAKE 1 TABLET BY MOUTH ONCE A WEEK. TAKE WITH A FULL GLASS OF WATER ON AN EMPTY STOMACH. (Patient taking differently: TAKE 70 MG BY MOUTH ONCE A WEEK ON MONDAY. TAKE WITH A FULL GLASS OF WATER ON AN EMPTY STOMACH.)  . AMITIZA 24 MCG capsule TAKE 1 CAPSULE BY MOUTH TWO TIMES DAILY WITH MEALS (Patient taking differently: take 1 capsule by mouth every other day)  . amLODipine (NORVASC) 5 MG tablet TAKE 1 TABLET BY MOUTH EVERY DAY (Patient taking differently: TAKE 5 MG BY MOUTH EVERY DAY)  . apixaban (ELIQUIS) 5 MG TABS tablet Take 1 tablet (5 mg total) by mouth 2 (two) times daily.  Marland Kitchen aspirin EC 81 MG tablet Take 81 mg by mouth at bedtime.   Marland Kitchen b complex vitamins tablet Take 1 tablet by mouth daily.   . calcium carbonate (OS-CAL) 600 MG TABS Take 600  mg by  mouth daily.  . cholecalciferol (VITAMIN D) 1000 UNITS tablet Take 1,000 Units by mouth daily.  . CO ENZYME Q-10 PO Take 1 capsule by mouth daily.   . CRESTOR 20 MG tablet Take 1 tablet (20 mg total) by mouth daily.  . irbesartan (AVAPRO) 75 MG tablet TAKE 1 TABLET (75 MG TOTAL) BY MOUTH AT BEDTIME.  . isosorbide mononitrate (IMDUR) 60 MG 24 hr tablet TAKE 1 TABLET BY MOUTH DAILY (Patient taking differently: Take 60 mg by mouth in the morning)  . metoprolol succinate (TOPROL-XL) 25 MG 24 hr tablet Take 0.5 tablets (12.5 mg total) by mouth daily.  . nitroGLYCERIN (NITROLINGUAL) 0.4 MG/SPRAY spray Place 1 spray under the tongue every 5 (five) minutes as needed for chest pain.  Marland Kitchen omega-3 acid ethyl esters (LOVAZA) 1 g capsule TAKE 1 CAPSULE (1 G TOTAL) BY MOUTH 4 (FOUR) TIMES DAILY.  . ranolazine (RANEXA) 500 MG 12 hr tablet TAKE 1 TABLET (500 MG TOTAL) BY MOUTH 2 (TWO) TIMES DAILY.  . vitamin C (ASCORBIC ACID) 500 MG tablet Take 1,000 mg by mouth daily.   Marland Kitchen PATADAY 0.2 % SOLN Place 1 drop into both eyes 2 (two) times daily.    No facility-administered encounter medications on file as of 04/17/2018.     Activities of Daily Living In your present state of health, do you have any difficulty performing the following activities: 04/17/2018  Hearing? N  Vision? N  Comment Wears glasses for reading   Difficulty concentrating or making decisions? Y  Comment Trouble remembering at time   Walking or climbing stairs? Y  Comment Avoids climbing stairs  Dressing or bathing? N  Doing errands, shopping? N  Preparing Food and eating ? N  Using the Toilet? N  In the past six months, have you accidently leaked urine? N  Do you have problems with loss of bowel control? N  Managing your Medications? N  Managing your Finances? N  Housekeeping or managing your Housekeeping? Y  Comment Husband helps with cleaing floors- sweeping and mopping  Some recent data might be hidden    Patient Care  Team: Timmothy Euler, MD as PCP - General (Family Medicine) [name], MD as Consulting Physician (Cardiology) Milus Banister, MD as Attending Physician (Gastroenterology)    Assessment:   This is a routine wellness examination for [facility].  Exercise Activities and Dietary recommendations Current Exercise Habits: Home exercise routine, Type of exercise: treadmill;Other - see comments(stationary bicycle ), Time (Minutes): 15, Frequency (Times/Week): 3, Weekly Exercise (Minutes/Week): 45, Intensity: Mild  Patient states she eats 3 meals per day usually a light breakfast and lunch, and larger supper.  Usually has cereal and yogurt in the mornings, fruits for lunch, and meat and vegetables for supper.  Recommended grilled, baked, broiled, sauteed for preparing food, and diet of mostly lean protein, vegetables, fruits, and whole grains.   Goals    . Exercise 3x per week (30 min per time)     Continue to walk for 30 minutes daily    . Exercise 3x per week (30 min per time)     Workout in home gym 3 days per week for 30 minuites        Fall Risk Fall Risk  04/17/2018 04/04/2018 03/16/2018 01/12/2018 11/15/2017  Falls in the past year? No No No No No  Number falls in past yr: - - - - -  Comment - - - - -  Injury with Fall? - - - - -  Comment - - - - -  Risk for fall due to : - - - - -  Follow up - - - - -   Is the patient's home free of loose throw rugs in walkways, pet beds, electrical cords, etc?   yes      Grab bars in the bathroom? yes      Handrails on the stairs?   yes      Adequate lighting?   yes    Depression Screen PHQ 2/9 Scores 04/17/2018 04/04/2018 03/16/2018 01/12/2018  PHQ - 2 Score 0 0 0 0     Cognitive Function MMSE - Mini Mental State Exam 04/17/2018 06/08/2016 11/17/2015 03/25/2015  Orientation to time 5 5 5 4   Orientation to Place 4 5 5 5   Registration 3 3 3 3   Attention/ Calculation 5 5 5 5   Recall 1 2 0 3  Language- name 2 objects 2 2 2 2   Language-  repeat 1 1 1 1   Language- follow 3 step command 3 3 3 3   Language- read & follow direction 1 1 1 1   Write a sentence 1 1 1 1   Copy design 1 1 1 1   Total score 27 29 27 29         Immunization History  Administered Date(s) Administered  . Influenza, High Dose Seasonal PF 07/12/2016, 07/12/2017  . Influenza,inj,Quad PF,6+ Mos 07/30/2013, 07/02/2014, 07/07/2015  . Pneumococcal Conjugate-13 02/24/2015  . Pneumococcal Polysaccharide-23 03/23/2011  . Tdap 06/23/2010  . Zoster 05/08/2010    Qualifies for Shingles Vaccine? Yes, declined today  Screening Tests Health Maintenance  Topic Date Due  . COLONOSCOPY  09/13/1962  . DEXA SCAN  02/23/2017  . INFLUENZA VACCINE  05/04/2018  . MAMMOGRAM  08/06/2019  . TETANUS/TDAP  06/23/2020  . Hepatitis C Screening  Completed  . PNA vac Low Risk Adult  Completed   Patient had negative cologuard 08/23/2016 Scheduled for dexa scan at next appointment 06/21/18  Cancer Screenings: Lung: Low Dose CT Chest recommended if Age 73-80 years, 30 pack-year currently smoking OR have quit w/in 15years. Patient does qualify. Breast:  Up to date on Mammogram? Yes   Up to date of Bone Density/Dexa? No Colorectal: yes   Additional Screenings:  Hepatitis C Screening:  Completed 11/06/15     Plan:     Work on your goal of increasing your exercise to 30 minutes, 3 times per week.    Review the information on Advance Directives, and if you complete these please bring a copy to our office to be filed in your chart.  I have put in an order for you to get your bone density test at your next visit.  Consider getting your Shingrix (shingles) vaccine at your next visit.   I have personally reviewed and noted the following in the patient's chart:   . Medical and social history . Use of alcohol, tobacco or illicit drugs  . Current medications and supplements . Functional ability and status . Nutritional status . Physical activity . Advanced  directives . List of other physicians . Hospitalizations, surgeries, and ER visits in previous 12 months . Vitals . Screenings to include cognitive, depression, and falls . Referrals and appointments  In addition, I have reviewed and discussed with patient certain preventive protocols, quality metrics, and best practice recommendations. A written personalized care plan for preventive services as well as general preventive health recommendations were provided to patient.     Denyce Robert, RN  04/17/2018  I have reviewed and agree with the above AWV documentation.   Laroy Apple, MD [facility] 04/18/2018, 4:33 PM

## 2018-04-17 NOTE — Patient Instructions (Signed)
Please work on your goal of increasing your exercise to 30 minutes, 3 times per week.    Please review the information on Advance Directives, and if you complete these please bring a copy to our office to be filed in your chart.  I have put in an order for you to get your bone density test at your next visit.  Consider getting your Shingrix (shingles) vaccine at your next visit.  Thank you for coming in for your Annual Wellness Visit today!   Preventive Care 9 Years and Older, Female Preventive care refers to lifestyle choices and visits with your health care provider that can promote health and wellness. What does preventive care include?  A yearly physical exam. This is also called an annual well check.  Dental exams once or twice a year.  Routine eye exams. Ask your health care provider how often you should have your eyes checked.  Personal lifestyle choices, including: ? Daily care of your teeth and gums. ? Regular physical activity. ? Eating a healthy diet. ? Avoiding tobacco and drug use. ? Limiting alcohol use. ? Practicing safe sex. ? Taking low-dose aspirin every day. ? Taking vitamin and mineral supplements as recommended by your health care provider. What happens during an annual well check? The services and screenings done by your health care provider during your annual well check will depend on your age, overall health, lifestyle risk factors, and family history of disease. Counseling Your health care provider may ask you questions about your:  Alcohol use.  Tobacco use.  Drug use.  Emotional well-being.  Home and relationship well-being.  Sexual activity.  Eating habits.  History of falls.  Memory and ability to understand (cognition).  Work and work Statistician.  Reproductive health.  Screening You may have the following tests or measurements:  Height, weight, and BMI.  Blood pressure.  Lipid and cholesterol levels. These may be checked  every 5 years, or more frequently if you are over 60 years old.  Skin check.  Lung cancer screening. You may have this screening every year starting at age 13 if you have a 30-pack-year history of smoking and currently smoke or have quit within the past 15 years.  Fecal occult blood test (FOBT) of the stool. You may have this test every year starting at age 42.  Flexible sigmoidoscopy or colonoscopy. You may have a sigmoidoscopy every 5 years or a colonoscopy every 10 years starting at age 63.  Hepatitis C blood test.  Hepatitis B blood test.  Sexually transmitted disease (STD) testing.  Diabetes screening. This is done by checking your blood sugar (glucose) after you have not eaten for a while (fasting). You may have this done every 1-3 years.  Bone density scan. This is done to screen for osteoporosis. You may have this done starting at age 23.  Mammogram. This may be done every 1-2 years. Talk to your health care provider about how often you should have regular mammograms.  Talk with your health care provider about your test results, treatment options, and if necessary, the need for more tests. Vaccines Your health care provider may recommend certain vaccines, such as:  Influenza vaccine. This is recommended every year.  Tetanus, diphtheria, and acellular pertussis (Tdap, Td) vaccine. You may need a Td booster every 10 years.  Varicella vaccine. You may need this if you have not been vaccinated.  Zoster vaccine. You may need this after age 40.  Measles, mumps, and rubella (MMR) vaccine. You  may need at least one dose of MMR if you were born in 1957 or later. You may also need a second dose.  Pneumococcal 13-valent conjugate (PCV13) vaccine. One dose is recommended after age 64.  Pneumococcal polysaccharide (PPSV23) vaccine. One dose is recommended after age 57.  Meningococcal vaccine. You may need this if you have certain conditions.  Hepatitis A vaccine. You may need  this if you have certain conditions or if you travel or work in places where you may be exposed to hepatitis A.  Hepatitis B vaccine. You may need this if you have certain conditions or if you travel or work in places where you may be exposed to hepatitis B.  Haemophilus influenzae type b (Hib) vaccine. You may need this if you have certain conditions.  Talk to your health care provider about which screenings and vaccines you need and how often you need them. This information is not intended to replace advice given to you by your health care provider. Make sure you discuss any questions you have with your health care provider. Document Released: 10/17/2015 Document Revised: 06/09/2016 Document Reviewed: 07/22/2015 Elsevier Interactive Patient Education  [employer].

## 2018-04-29 ENCOUNTER — Other Ambulatory Visit: Payer: Self-pay | Admitting: Cardiovascular Disease

## 2018-05-01 ENCOUNTER — Other Ambulatory Visit: Payer: Self-pay | Admitting: Cardiovascular Disease

## 2018-05-01 MED ORDER — PANTOPRAZOLE SODIUM 40 MG PO TBEC: 40.0000 mg | DELAYED_RELEASE_TABLET | Freq: Every day | ORAL | 3 refills | Status: DC

## 2018-05-01 NOTE — Addendum Note (Signed)
Addended by: Waylan Rocher on: 05/01/2018 04:35 PM   Modules accepted: Orders

## 2018-05-10 ENCOUNTER — Ambulatory Visit: Payer: Medicare HMO | Admitting: *Deleted

## 2018-05-12 ENCOUNTER — Other Ambulatory Visit: Payer: Self-pay | Admitting: Cardiovascular Disease

## 2018-05-29 ENCOUNTER — Telehealth: Payer: Self-pay

## 2018-05-29 MED ORDER — ALENDRONATE SODIUM 70 MG PO TABS: ORAL_TABLET | ORAL | 1 refills | Status: DC

## 2018-05-29 NOTE — Telephone Encounter (Signed)
Prescription sent to pharmacy.

## 2018-06-21 ENCOUNTER — Ambulatory Visit (INDEPENDENT_AMBULATORY_CARE_PROVIDER_SITE_OTHER): Payer: Medicare HMO

## 2018-06-21 ENCOUNTER — Ambulatory Visit (INDEPENDENT_AMBULATORY_CARE_PROVIDER_SITE_OTHER): Payer: Medicare HMO | Admitting: Family Medicine

## 2018-06-21 ENCOUNTER — Encounter: Payer: Self-pay | Admitting: Family Medicine

## 2018-06-21 VITALS — BP 131/52 | HR 53 | Temp 98.1°F | Ht 62.0 in | Wt 152.0 lb

## 2018-06-21 DIAGNOSIS — E7800 Pure hypercholesterolemia, unspecified: Secondary | ICD-10-CM

## 2018-06-21 DIAGNOSIS — I482 Chronic atrial fibrillation, unspecified: Secondary | ICD-10-CM

## 2018-06-21 DIAGNOSIS — R7989 Other specified abnormal findings of blood chemistry: Secondary | ICD-10-CM | POA: Diagnosis not present

## 2018-06-21 DIAGNOSIS — M8580 Other specified disorders of bone density and structure, unspecified site: Secondary | ICD-10-CM | POA: Insufficient documentation

## 2018-06-21 DIAGNOSIS — Z780 Asymptomatic menopausal state: Secondary | ICD-10-CM

## 2018-06-21 DIAGNOSIS — N183 Chronic kidney disease, stage 3 unspecified: Secondary | ICD-10-CM

## 2018-06-21 DIAGNOSIS — M8589 Other specified disorders of bone density and structure, multiple sites: Secondary | ICD-10-CM | POA: Diagnosis not present

## 2018-06-21 DIAGNOSIS — I10 Essential (primary) hypertension: Secondary | ICD-10-CM

## 2018-06-21 MED ORDER — IRBESARTAN 75 MG PO TABS: ORAL_TABLET | ORAL | 9 refills | Status: DC

## 2018-06-21 MED ORDER — AMLODIPINE BESYLATE 5 MG PO TABS: ORAL_TABLET | ORAL | 1 refills | Status: DC

## 2018-06-21 NOTE — Progress Notes (Signed)
Subjective: CC: HTN, HLD, CAD, AFib PCP: [name], DO [id number] L Smalling is a 72 y.o. female presenting to clinic today for:  1.  Hypertension/CAD/ atrial fibrillation Patient with past medical history significant for cardioversion this April for atrial fibrillation.  She is chronically anticoagulated with Eliquis 5 milligrams twice daily.  She reports compliance with medicine but notes that she is not a fan of taking the Eliquis.  She does understand the reason for the medication.  She does report easy bruising and bleeding but denies any prolonged bleeding.  No melena or hematochezia.  Denies any chest pain.  She uses Ranexa which helps with this.  She reports compliance with the Norvasc and metoprolol but states that she thought she was supposed to discontinue the Avapro so she is not been on this medicine since seeing her cardiologist.  She reports chronic lower extremity edema which is more prevalent towards the end of the day.  She reports compliance with Crestor.  2.  CKD 3 Patient reports that she watches her salt intake very closely.  She hydrates well.  She discontinued Avapro as above, she thought she was told to discontinue the medicine by her cardiologist.  3.  Abnormal TSH Patient noted to have an abnormal TSH on last labs.  Denies any hyper or hypothyroid symptoms, including heart palpitations.  She does note weight loss since cardioversion in April.  Denies early satiety or melena.  She states that she just eats what she needs and then stops eating.    ROS: Per HPI  Allergies  Allergen Reactions  . Lactose Intolerance (Gi) Nausea And Vomiting and Nausea Only  . Codeine Nausea And Vomiting  . Hydrocortisone Itching   Past Medical History:  Diagnosis Date  . Allergy   . Arthritis   . Cancer ([place])    ovarian  . Coronary artery disease   . Heart murmur   . Hypercholesteremia   . Hypertension   . 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 Osteopenia   .  Stomach problems   . Unstable angina ([place]) 05/27/2014    Current Outpatient Medications:  .  alendronate (FOSAMAX) 70 MG tablet, TAKE 1 TABLET BY MOUTH ONCE A WEEK. TAKE WITH A FULL GLASS OF WATER ON AN EMPTY STOMACH., Disp: 12 tablet, Rfl: 1 .  AMITIZA 24 MCG capsule, TAKE 1 CAPSULE BY MOUTH TWO TIMES DAILY WITH MEALS (Patient taking differently: take 1 capsule by mouth every other day), Disp: 180 capsule, Rfl: 1 .  amLODipine (NORVASC) 5 MG tablet, TAKE 1 TABLET BY MOUTH EVERY DAY (Patient taking differently: TAKE 5 MG BY MOUTH EVERY DAY), Disp: 90 tablet, Rfl: 1 .  apixaban (ELIQUIS) 5 MG TABS tablet, Take 1 tablet (5 mg total) by mouth 2 (two) times daily., Disp: 180 tablet, Rfl: 1 .  aspirin EC 81 MG tablet, Take 81 mg by mouth at bedtime. , Disp: , Rfl:  .  b complex vitamins tablet, Take 1 tablet by mouth daily. , Disp: , Rfl:  .  calcium carbonate (OS-CAL) 600 MG TABS, Take 600 mg by mouth daily., Disp: , Rfl:  .  cholecalciferol (VITAMIN D) 1000 UNITS tablet, Take 1,000 Units by mouth daily., Disp: , Rfl:  .  CO ENZYME Q-10 PO, Take 1 capsule by mouth daily. , Disp: , Rfl:  .  metoprolol succinate (TOPROL-XL) 25 MG 24 hr tablet, Take 0.5 tablets (12.5 mg total) by mouth daily., Disp: 30 tablet, Rfl: 11 .  nitroGLYCERIN (NITROLINGUAL) 0.4 MG/SPRAY spray, Place 1 spray under the tongue every 5 (five) minutes as needed for chest pain., Disp: 12 g, Rfl: 3 .  omega-3 acid ethyl esters (LOVAZA) 1 g capsule, TAKE 1 CAPSULE (1 G TOTAL) BY MOUTH 4 (FOUR) TIMES DAILY., Disp: 120 capsule, Rfl: 4 .  pantoprazole (PROTONIX) 40 MG tablet, Take 1 tablet (40 mg total) by mouth daily., Disp: 90 tablet, Rfl: 3 .  PATADAY 0.2 % SOLN, Place 1 drop into both eyes 2 (two) times daily. , Disp: , Rfl: 6 .  ranolazine (RANEXA) 500 MG 12 hr tablet, TAKE 1 TABLET (500 MG TOTAL) BY MOUTH 2 (TWO) TIMES DAILY., Disp: 60 tablet, Rfl: 6 .  rosuvastatin (CRESTOR) 20 MG tablet, Take 1 tablet (20 mg total) by mouth daily.,  Disp: 90 tablet, Rfl: 3 .  vitamin C (ASCORBIC ACID) 500 MG tablet, Take 1,000 mg by mouth daily. , Disp: , Rfl:  Social History   Socioeconomic History  . Marital status: Married    Spouse name: [name]   . Number of children: 3  . Years of education: 32  . Highest education level: Not on file  Occupational History  . Occupation: Retired  Scientific laboratory technician  . Financial resource strain: Not on file  . Food insecurity:    Worry: Not on file    Inability: Not on file  . Transportation needs:    Medical: Not on file    Non-medical: Not on file  Tobacco Use  . Smoking status: Former Smoker    Packs/day: 0.50    Years: 49.00    Pack years: 24.50    Types: Cigarettes    Last attempt to quit: 10/04/2009    Years since quitting: 8.7  . Smokeless tobacco: Never Used  Substance and Sexual Activity  . Alcohol use: Yes    Alcohol/week: 1.0 standard drinks    Types: 1 Glasses of wine per week    Comment: red wine once per month  . Drug use: No  . Sexual activity: Not Currently    Birth control/protection: Surgical, Post-menopausal  Lifestyle  . Physical activity:    Days per week: Not on file    Minutes per session: Not on file  . Stress: Not on file  Relationships  . Social connections:    Talks on phone: Not on file    Gets together: Not on file    Attends religious service: Not on file    Active member of club or organization: Not on file    Attends meetings of clubs or organizations: Not on file    Relationship status: Not on file  . Intimate partner violence:    Fear of current or ex partner: Not on file    Emotionally abused: Not on file    Physically abused: Not on file    Forced sexual activity: Not on file  Other Topics Concern  . Not on file  Social History Narrative   Lives w/ husband   Caffeine use: 3 cups coffee/day   Family History  Problem Relation Age of Onset  . Stroke Mother   . Diabetes Mother   . Kidney disease Mother   . Hypertension Mother   .  Hyperlipidemia Mother   . Dementia Mother   . Heart disease Daughter        heart attack x2  . Heart disease Brother        2 heart attacks  . Breast cancer Maternal Aunt   .  Colon cancer Neg Hx   . Stomach cancer Neg Hx   . Esophageal cancer Neg Hx   . Rectal cancer Neg Hx   . Liver cancer Neg Hx     Objective: Office vital signs reviewed. BP (!) 131/52   Pulse (!) 53   Temp 98.1 F (36.7 C) (Oral)   Ht 5\' 2"  (1.575 m)   Wt 152 lb (68.9 kg)   BMI 27.80 kg/m   Physical Examination:  General: Awake, alert, well nourished, No acute distress HEENT: Normal, PERRL, EOMI, sclera white, MMM Cardio: bradycardic w/ regular rhythm, S1S2 heard, no murmurs appreciated Pulm: clear to auscultation bilaterally, no wheezes, rhonchi or rales; normal work of breathing on room air Extremities: warm, well perfused, No edema, cyanosis or clubbing; +2 pulses bilaterally  Assessment/ Plan: 72 y.o. female   1. Essential hypertension Blood pressure well controlled on current regimen.  We did discuss that after reviewing Dr. Naida Sleight note it appears that he wished that she would stay on the Avapro.  I am concerned that blood pressure may decrease to much on triple therapy.  Given history of CKD I would favor continuing the ARB over the CCB.  However, I did ask the patient to contact her cardiologist to verify that he wants her on triple therapy.  She will monitor her blood pressures closely and if she has blood pressure <110/50 she will contact the office for instructions. - Basic Metabolic Panel  2. Chronic atrial fibrillation (HCC) Bradycardic but rhythm is normal today.  Continue Eliquis and beta-blocker at reduced dose of 12.5 mg twice daily  3. Chronic kidney disease (CKD), stage III (moderate) (HCC) Check BMP today. - Basic Metabolic Panel  4. Pure hypercholesterolemia Continue Crestor  5. Abnormal TSH We will recheck. - TSH   Orders Placed This Encounter  Procedures  . TSH  .  Basic Metabolic Panel   Meds ordered this encounter  Medications  . irbesartan (AVAPRO) 75 MG tablet    Sig: TAKE 1 TABLET (75 MG TOTAL) BY MOUTH AT BEDTIME.    Dispense:  30 tablet    Refill:  9  . amLODipine (NORVASC) 5 MG tablet    Sig: TAKE 5 MG BY MOUTH EVERY DAY    Dispense:  90 tablet    Refill:  [place], DO [place] [phone]

## 2018-06-21 NOTE — Patient Instructions (Signed)
See me back in about 4 months or sooner if needed.  You had labs performed today.  You will be contacted with the results of the labs once they are available, usually in the next 3 business days for routine lab work.

## 2018-06-22 LAB — BASIC METABOLIC PANEL
BUN / CREAT RATIO: 14 (ref 12–28)
BUN: 15 mg/dL (ref 8–27)
CALCIUM: 9.6 mg/dL (ref 8.7–10.3)
CHLORIDE: 99 mmol/L (ref 96–106)
CO2: 26 mmol/L (ref 20–29)
CREATININE: 1.09 mg/dL — AB (ref 0.57–1.00)
GFR calc Af Amer: 58 mL/min/{1.73_m2} — ABNORMAL LOW (ref >59)
GFR calc non Af Amer: 50 mL/min/{1.73_m2} — ABNORMAL LOW (ref >59)
GLUCOSE: 99 mg/dL (ref 65–99)
Potassium: 4.9 mmol/L (ref 3.5–5.2)
Sodium: 140 mmol/L (ref 134–144)

## 2018-06-22 LAB — TSH: TSH: 3.83 u[IU]/mL (ref 0.450–4.500)

## 2018-06-23 ENCOUNTER — Telehealth: Payer: Self-pay | Admitting: Cardiovascular Disease

## 2018-06-23 NOTE — Telephone Encounter (Signed)
New Message:    Patient stated her PCP is requesting for her to start a blood thinner but would like to consult with a nurse before she begin the medication

## 2018-06-23 NOTE — Telephone Encounter (Signed)
Returned call to patient, patient states she saw a new PCP yesterday and she wanted her to restart the irbesartan.    She is unsure about this and wanted to run this by Dr. Gwenlyn Found.   Per last OV note: irbesartan was on her med list but patient states she was not taking this as it had been stopped by Dr. Wendi Snipes.    She monitors BP at home and states it is normally controlled, reading given was 127/50.      Per PCP note: 1. Essential hypertension Blood pressure well controlled on current regimen.  We did discuss that after reviewing Dr. Naida Sleight note it appears that he wished that she would stay on the Avapro.  I am concerned that blood pressure may decrease to much on triple therapy.  Given history of CKD I would favor continuing the ARB over the CCB.  However, I did ask the patient to contact her cardiologist to verify that he wants her on triple therapy.  She will monitor her blood pressures closely and if she has blood pressure <110/50 she will contact the office for instructions. - Basic Metabolic Panel   Advised would route to MD to review and advise, also advised to continue to monitor BP as she has been.

## 2018-06-26 ENCOUNTER — Telehealth: Payer: Self-pay | Admitting: Cardiovascular Disease

## 2018-06-26 NOTE — Telephone Encounter (Signed)
New Message           Patient called and would like a call back concerning her medication.

## 2018-06-27 NOTE — Telephone Encounter (Signed)
Patient called regarding last telephone note regarding BP. Patient was advised it was sent to [facility] and we are waiting for him to respond regarding the medication question. Patient verbalized understanding that we would call her once he gave [place] an answer.   Patient had no other questions or issues.   Please see phone note from 06/23/18. Thank you!

## 2018-06-29 NOTE — Telephone Encounter (Signed)
Called patient, she would like to try the Avapro and take half of the amlodipine. Patient will call in a few weeks with BP readings.

## 2018-06-29 NOTE — Telephone Encounter (Signed)
It seems that the primary care physician wanted to resume Avapro due to mild chronic kidney disease but was concerned about dropping the blood pressure too much.  It might be best to resume Avapro and decrease amlodipine to 2.5 mg once daily.

## 2018-07-25 ENCOUNTER — Other Ambulatory Visit: Payer: Self-pay | Admitting: Dermatology

## 2018-07-25 DIAGNOSIS — D229 Melanocytic nevi, unspecified: Secondary | ICD-10-CM | POA: Diagnosis not present

## 2018-07-25 DIAGNOSIS — L820 Inflamed seborrheic keratosis: Secondary | ICD-10-CM | POA: Diagnosis not present

## 2018-07-25 DIAGNOSIS — D485 Neoplasm of uncertain behavior of skin: Secondary | ICD-10-CM | POA: Diagnosis not present

## 2018-08-09 ENCOUNTER — Ambulatory Visit (INDEPENDENT_AMBULATORY_CARE_PROVIDER_SITE_OTHER): Payer: Medicare HMO | Admitting: Family Medicine

## 2018-08-09 ENCOUNTER — Encounter: Payer: Self-pay | Admitting: Family Medicine

## 2018-08-09 VITALS — BP 126/53 | HR 55 | Temp 97.5°F | Ht 62.0 in | Wt 154.0 lb

## 2018-08-09 DIAGNOSIS — R040 Epistaxis: Secondary | ICD-10-CM | POA: Diagnosis not present

## 2018-08-09 DIAGNOSIS — M85851 Other specified disorders of bone density and structure, right thigh: Secondary | ICD-10-CM

## 2018-08-09 DIAGNOSIS — M810 Age-related osteoporosis without current pathological fracture: Secondary | ICD-10-CM | POA: Diagnosis not present

## 2018-08-09 DIAGNOSIS — M8580 Other specified disorders of bone density and structure, unspecified site: Secondary | ICD-10-CM

## 2018-08-09 DIAGNOSIS — Z23 Encounter for immunization: Secondary | ICD-10-CM

## 2018-08-09 MED ORDER — MUPIROCIN CALCIUM 2 % NA OINT: 1.0000 "application " | TOPICAL_OINTMENT | Freq: Two times a day (BID) | NASAL | 0 refills | Status: DC

## 2018-08-09 NOTE — Progress Notes (Signed)
Subjective: CC: ENT referral PCP: [name], DO [id number] Kristina Gibbs is a 72 y.o. female presenting to clinic today for:  1. ENT referral Patient reports that she has been having nosebleeds on the left side for the last 2 weeks.  She has experienced a few episodes of prolonged nasal bleeding that lasted longer than 15 or 20 minutes.  She notes that her nose bled all the way to the appointment today but has since stopped.  Denies any dizziness, lightheadedness, weakness, feeling fatigued or short of breath.  Denies any trauma to the nose but notes that it is sore inside.  No purulent material.  She has not been using any nasal sprays until recently when it became sore.  She is not sure what she used but it was over-the-counter and she felt like it was somewhat better the following day.  She is currently using humidification every night at bedtime.  Denies any other upper respiratory symptoms.  2.  Osteopenia Patient recently had her bone density scan which demonstrated a T score of -2.2 and a FRAX score of 20.7 within the next 10 years.  She is already on vitamin D, calcium and Fosamax and has been on these regimens for many years, she thinks longer than 5 years.  She has almost fallen because her dog, who weighs 45 pounds, took off a few times during walks but otherwise does well.  She tries to stay physically active.   ROS: Per HPI  Allergies  Allergen Reactions  . Lactose Intolerance (Gi) Nausea And Vomiting and Nausea Only  . Codeine Nausea And Vomiting  . Hydrocortisone Itching   Past Medical History:  Diagnosis Date  . Allergy   . Arthritis   . Cancer ([place])    ovarian  . Coronary artery disease   . Heart murmur   . Hypercholesteremia   . Hypertension   . 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 Osteopenia   . Stomach problems   . Unstable angina ([place]) 05/27/2014    Current Outpatient Medications:  .  alendronate (FOSAMAX) 70 MG tablet, TAKE 1 TABLET BY MOUTH ONCE  A WEEK. TAKE WITH A FULL GLASS OF WATER ON AN EMPTY STOMACH., Disp: 12 tablet, Rfl: 1 .  AMITIZA 24 MCG capsule, TAKE 1 CAPSULE BY MOUTH TWO TIMES DAILY WITH MEALS (Patient taking differently: take 1 capsule by mouth every other day), Disp: 180 capsule, Rfl: 1 .  amLODipine (NORVASC) 5 MG tablet, TAKE 5 MG BY MOUTH EVERY DAY, Disp: 90 tablet, Rfl: 1 .  apixaban (ELIQUIS) 5 MG TABS tablet, Take 1 tablet (5 mg total) by mouth 2 (two) times daily., Disp: 180 tablet, Rfl: 1 .  aspirin EC 81 MG tablet, Take 81 mg by mouth at bedtime. , Disp: , Rfl:  .  b complex vitamins tablet, Take 1 tablet by mouth daily. , Disp: , Rfl:  .  calcium carbonate (OS-CAL) 600 MG TABS, Take 600 mg by mouth daily., Disp: , Rfl:  .  cholecalciferol (VITAMIN D) 1000 UNITS tablet, Take 1,000 Units by mouth daily., Disp: , Rfl:  .  CO ENZYME Q-10 PO, Take 1 capsule by mouth daily. , Disp: , Rfl:  .  irbesartan (AVAPRO) 75 MG tablet, TAKE 1 TABLET (75 MG TOTAL) BY MOUTH AT BEDTIME., Disp: 30 tablet, Rfl: 9 .  metoprolol succinate (TOPROL-XL) 25 MG 24 hr tablet, Take 0.5 tablets (12.5 mg total) by mouth daily., Disp: 30 tablet, Rfl: 11 .  nitroGLYCERIN (NITROLINGUAL) 0.4 MG/SPRAY spray, Place 1 spray under the tongue every 5 (five) minutes as needed for chest pain., Disp: 12 g, Rfl: 3 .  omega-3 acid ethyl esters (LOVAZA) 1 g capsule, TAKE 1 CAPSULE (1 G TOTAL) BY MOUTH 4 (FOUR) TIMES DAILY., Disp: 120 capsule, Rfl: 4 .  pantoprazole (PROTONIX) 40 MG tablet, Take 1 tablet (40 mg total) by mouth daily., Disp: 90 tablet, Rfl: 3 .  PATADAY 0.2 % SOLN, Place 1 drop into both eyes 2 (two) times daily. , Disp: , Rfl: 6 .  ranolazine (RANEXA) 500 MG 12 hr tablet, TAKE 1 TABLET (500 MG TOTAL) BY MOUTH 2 (TWO) TIMES DAILY., Disp: 60 tablet, Rfl: 6 .  rosuvastatin (CRESTOR) 20 MG tablet, Take 1 tablet (20 mg total) by mouth daily., Disp: 90 tablet, Rfl: 3 .  vitamin C (ASCORBIC ACID) 500 MG tablet, Take 1,000 mg by mouth daily. , Disp: ,  Rfl:  Social History   Socioeconomic History  . Marital status: Married    Spouse name: [name]   . Number of children: 3  . Years of education: 35  . Highest education level: Not on file  Occupational History  . Occupation: Retired  Scientific laboratory technician  . Financial resource strain: Not on file  . Food insecurity:    Worry: Not on file    Inability: Not on file  . Transportation needs:    Medical: Not on file    Non-medical: Not on file  Tobacco Use  . Smoking status: Former Smoker    Packs/day: 0.50    Years: 49.00    Pack years: 24.50    Types: Cigarettes    Last attempt to quit: 10/04/2009    Years since quitting: 8.8  . Smokeless tobacco: Never Used  Substance and Sexual Activity  . Alcohol use: Yes    Alcohol/week: 1.0 standard drinks    Types: 1 Glasses of wine per week    Comment: red wine once per month  . Drug use: No  . Sexual activity: Not Currently    Birth control/protection: Surgical, Post-menopausal  Lifestyle  . Physical activity:    Days per week: Not on file    Minutes per session: Not on file  . Stress: Not on file  Relationships  . Social connections:    Talks on phone: Not on file    Gets together: Not on file    Attends religious service: Not on file    Active member of club or organization: Not on file    Attends meetings of clubs or organizations: Not on file    Relationship status: Not on file  . Intimate partner violence:    Fear of current or ex partner: Not on file    Emotionally abused: Not on file    Physically abused: Not on file    Forced sexual activity: Not on file  Other Topics Concern  . Not on file  Social History Narrative   Lives w/ husband   Caffeine use: 3 cups coffee/day   Family History  Problem Relation Age of Onset  . Stroke Mother   . Diabetes Mother   . Kidney disease Mother   . Hypertension Mother   . Hyperlipidemia Mother   . Dementia Mother   . Heart disease Daughter        heart attack x2  . Heart disease  Brother        2 heart attacks  . Breast cancer Maternal Aunt   .  Colon cancer Neg Hx   . Stomach cancer Neg Hx   . Esophageal cancer Neg Hx   . Rectal cancer Neg Hx   . Liver cancer Neg Hx     Objective: Office vital signs reviewed. BP (!) 126/53   Pulse (!) 55   Temp (!) 97.5 F (36.4 C) (Oral)   Ht 5' 2" (1.575 m)   Wt 154 lb (69.9 kg)   BMI 28.17 kg/m   Physical Examination:  General: Awake, alert, well nourished, No acute distress Nose: nasal turbinates moist, clear nasal discharge; she has a hemostatic bleed within the left nare.  There is slight swelling near the vestibule.    Throat: moist mucus membranes Cardio: bradycardic rate Pulm: Normal work of breathing on room air MSK: Ambulates independently. Normal tone.  Assessment/ Plan: 72 y.o. female   1. Epistaxis She has slight vestibular swelling.  I have prescribed her Bactroban cream to apply within the left nare twice daily for 5 days.  Given her prolonged nasal bleeding, I have gone ahead and placed a referral to ENT per her request.  Handout provided and reasons for return discussed. - Ambulatory referral to ENT  2. Osteopenia after menopause T score of -2.2 at the femoral neck on the right and FRAX score of 20.7 within the next 10 years.  She is a candidate for therapy and at this time would benefit from transition from bisphosphonate to Prolia.  We will work on arranging this.  I discussed the risks and benefits with the patient and she is amenable to this transition.  Will check vitamin D and calcium levels today. - VITAMIN D 25 Hydroxy (Vit-D Deficiency, Fractures) - CMP14+EGFR   Orders Placed This Encounter  Procedures  . VITAMIN D 25 Hydroxy (Vit-D Deficiency, Fractures)  . CMP14+EGFR  . Ambulatory referral to ENT    Referral Priority:   Routine    Referral Type:   Consultation    Referral Reason:   Specialty Services Required    Requested Specialty:   Otolaryngology    Number of Visits Requested:    1   Meds ordered this encounter  Medications  . mupirocin nasal ointment (BACTROBAN NASAL) 2 %    Sig: Place 1 application into the nose 2 (two) times daily. x5d    Dispense:  5 g    Refill:  0      Windell Moulding, DO [place] [phone]

## 2018-08-09 NOTE — Patient Instructions (Addendum)
You had labs performed today.  You will be contacted with the results of the labs once they are available, usually in the next 3 business days for routine lab work.   We are checking your calcium and vitamin D level today.  What I may do is discontinue the Fosamax and replace it with a twice yearly infusion called Prolia.  I will have Leslie contact you about this.  I have prescribed a nasal ointment to use in the left nostril 2 times daily for 5 days.  I recommend that you add nasal saline spray or nasal gel as well.  Continue humidification.  Referral to the ear nose and throat specialist has been placed.   Nosebleed, Adult A nosebleed is when blood comes out of the nose. Nosebleeds are common. Usually, they are not a sign of a serious condition. Nosebleeds can happen if a small blood vessel in your nose starts to bleed or if the lining of your nose (mucous membrane) cracks. They are commonly caused by:  Allergies.  Colds.  Picking your nose.  Blowing your nose too hard.  An injury from sticking an object into your nose or getting hit in the nose.  Dry or cold air.  Less common causes of nosebleeds include:  Toxic fumes.  Something abnormal in the nose or in the air-filled spaces in the bones of the face (sinuses).  Growths in the nose, such as polyps.  Medicines or conditions that cause blood to clot slowly.  Certain illnesses or procedures that irritate or dry out the nasal passages.  Follow these instructions at home: When you have a nosebleed:  Sit down and tilt your head slightly forward.  Use a clean towel or tissue to pinch your nostrils under the bony part of your nose. After 10 minutes, let go of your nose and see if bleeding starts again. Do not release pressure before that time. If there is still bleeding, repeat the pinching and holding for 10 minutes until the bleeding stops.  Do not place tissues or gauze in the nose to stop bleeding.  Avoid lying down  and avoid tilting your head backward. That may make blood collect in the throat and cause gagging or coughing.  Use a nasal spray decongestant to help with a nosebleed as told by your health care provider.  Do not use petroleum jelly or mineral oil in your nose. It can drip into your lungs. After a nosebleed:  Avoid blowing your nose or sniffing for a number of hours.  Avoid straining, lifting, or bending at the waist for several days. You may resume other normal activities as you are able.  Use saline spray or a humidifier as told by your health care provider.  Aspirinand blood thinners make bleeding more likely. If you are prescribed these medicines and you suffer from nosebleeds: ? Ask your health care provider if you should stop taking the medicines or if you should adjust the dose. ? Do not stop taking medicines that your health care provider has recommended unless told by your health care provider.  If your nosebleed was caused by dry mucous membranes, use over-the-counter saline nasal spray or gel. This will keep the mucous membranes moist and allow them to heal. If you must use a lubricant: ? Choose one that is water-soluble. ? Use only as much as you need and use it only as often as needed. ? Do not lie down until several hours after you use it. Contact a health  care provider if:  You have a fever.  You get nosebleeds often or more often than usual.  You bruise very easily.  You have a nosebleed from having something stuck in your nose.  You have bleeding in your mouth.  You vomit or cough up brown material.  You have a nosebleed after you start a new medicine. Get help right away if:  You have a nosebleed after a fall or a head injury.  Your nosebleed does not go away after 20 minutes.  You feel dizzy or weak.  You have unusual bleeding from other parts of your body.  You have unusual bruising on other parts of your body.  You become sweaty.  You vomit  blood. This information is not intended to replace advice given to you by your health care provider. Make sure you discuss any questions you have with your health care provider. Document Released: 06/30/2005 Document Revised: 05/20/2016 Document Reviewed: 04/06/2016 Elsevier Interactive Patient Education  [employer].

## 2018-08-10 LAB — CMP14+EGFR
A/G RATIO: 1.2 (ref 1.2–2.2)
ALK PHOS: 56 IU/L (ref 39–117)
ALT: 20 IU/L (ref 0–32)
AST: 18 IU/L (ref 0–40)
Albumin: 3.9 g/dL (ref 3.5–4.8)
BILIRUBIN TOTAL: 0.6 mg/dL (ref 0.0–1.2)
BUN/Creatinine Ratio: 21 (ref 12–28)
BUN: 20 mg/dL (ref 8–27)
CHLORIDE: 98 mmol/L (ref 96–106)
CO2: 24 mmol/L (ref 20–29)
Calcium: 9.5 mg/dL (ref 8.7–10.3)
Creatinine, Ser: 0.97 mg/dL (ref 0.57–1.00)
GFR calc Af Amer: 67 mL/min/{1.73_m2} (ref >59)
GFR calc non Af Amer: 58 mL/min/{1.73_m2} — ABNORMAL LOW (ref >59)
Globulin, Total: 3.3 g/dL (ref 1.5–4.5)
Glucose: 106 mg/dL — ABNORMAL HIGH (ref 65–99)
POTASSIUM: 5 mmol/L (ref 3.5–5.2)
Sodium: 135 mmol/L (ref 134–144)
Total Protein: 7.2 g/dL (ref 6.0–8.5)

## 2018-08-10 LAB — VITAMIN D 25 HYDROXY (VIT D DEFICIENCY, FRACTURES): VIT D 25 HYDROXY: 46.7 ng/mL (ref 30.0–100.0)

## 2018-08-14 ENCOUNTER — Other Ambulatory Visit: Payer: Self-pay | Admitting: Family Medicine

## 2018-08-15 ENCOUNTER — Other Ambulatory Visit: Payer: Self-pay | Admitting: Family Medicine

## 2018-08-15 MED ORDER — MUPIROCIN 2 % EX OINT: 1.0000 "application " | TOPICAL_OINTMENT | Freq: Two times a day (BID) | CUTANEOUS | 0 refills | Status: AC

## 2018-08-15 NOTE — Telephone Encounter (Signed)
Changed to 22g

## 2018-08-15 NOTE — Telephone Encounter (Signed)
Pharmacy comment:  Alternative Requested:MFR. DISC.

## 2018-08-21 ENCOUNTER — Telehealth: Payer: Self-pay | Admitting: *Deleted

## 2018-08-21 NOTE — Telephone Encounter (Signed)
Insurance verification submitted and summary of benefits received. Prior authorization needed and initiated on Cover My Meds. . If approved the patient will have to cover 20% of the cost which is roughly $240. Discussed this with the patient and advised that I will be back in touch once I receive a determination about the prior authorization. She may qualify for Medicare Extra Help or the [employer].   She has taken Fosamax for over 5 years and bone density has not improved. She is at high risk for fracture due to being a former smoker, advancing age, and hx of osteoporotic fracture.

## 2018-08-23 NOTE — Telephone Encounter (Signed)
Prior authorization for Prolia was approved by [employer]. Auth #: [id number]  Patient will still owe her portion which is 20%. I will send her information on Medicare Extra Help and the [facility].

## 2018-08-26 ENCOUNTER — Encounter: Payer: Self-pay | Admitting: Family Medicine

## 2018-08-26 ENCOUNTER — Ambulatory Visit (INDEPENDENT_AMBULATORY_CARE_PROVIDER_SITE_OTHER): Payer: Medicare HMO | Admitting: Family Medicine

## 2018-08-26 VITALS — BP 127/42 | HR 56 | Temp 97.7°F | Ht 62.0 in | Wt 153.2 lb

## 2018-08-26 DIAGNOSIS — J069 Acute upper respiratory infection, unspecified: Secondary | ICD-10-CM

## 2018-08-26 DIAGNOSIS — B9789 Other viral agents as the cause of diseases classified elsewhere: Secondary | ICD-10-CM | POA: Diagnosis not present

## 2018-08-26 NOTE — Progress Notes (Signed)
Subjective: CC: Cough PCP: [name], DO ATF:[id number] L Kristina Gibbs is a 72 y.o. female presenting to clinic today for:  1. Cough Patient reports onset of dizziness, headache, cough with green sputum yesterday.  She has had subjective fevers and chills.  She is been using Tylenol for symptoms with little improvement.  No known sick contacts.  She has not taken anything over-the-counter because she feared it would interact with her current medications.  No shortness of breath, wheeze.   ROS: Per HPI  Allergies  Allergen Reactions  . Lactose Intolerance (Gi) Nausea And Vomiting and Nausea Only  . Codeine Nausea And Vomiting  . Hydrocortisone Itching   Past Medical History:  Diagnosis Date  . Allergy   . Arthritis   . Cancer ([place])    ovarian  . Coronary artery disease   . Heart murmur   . Hypercholesteremia   . Hypertension   . 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 Osteopenia   . Stomach problems   . Unstable angina ([place]) 05/27/2014    Current Outpatient Medications:  .  alendronate (FOSAMAX) 70 MG tablet, TAKE 1 TABLET BY MOUTH ONCE A WEEK. TAKE WITH A FULL GLASS OF WATER ON AN EMPTY STOMACH., Disp: 12 tablet, Rfl: 1 .  AMITIZA 24 MCG capsule, TAKE 1 CAPSULE BY MOUTH TWO TIMES DAILY WITH MEALS (Patient taking differently: take 1 capsule by mouth every other day), Disp: 180 capsule, Rfl: 1 .  amLODipine (NORVASC) 5 MG tablet, TAKE 5 MG BY MOUTH EVERY DAY, Disp: 90 tablet, Rfl: 1 .  apixaban (ELIQUIS) 5 MG TABS tablet, Take 1 tablet (5 mg total) by mouth 2 (two) times daily., Disp: 180 tablet, Rfl: 1 .  aspirin EC 81 MG tablet, Take 81 mg by mouth at bedtime. , Disp: , Rfl:  .  b complex vitamins tablet, Take 1 tablet by mouth daily. , Disp: , Rfl:  .  calcium carbonate (OS-CAL) 600 MG TABS, Take 600 mg by mouth daily., Disp: , Rfl:  .  cholecalciferol (VITAMIN D) 1000 UNITS tablet, Take 1,000 Units by mouth daily., Disp: , Rfl:  .  CO ENZYME Q-10 PO, Take 1 capsule by  mouth daily. , Disp: , Rfl:  .  irbesartan (AVAPRO) 75 MG tablet, TAKE 1 TABLET (75 MG TOTAL) BY MOUTH AT BEDTIME., Disp: 30 tablet, Rfl: 9 .  metoprolol succinate (TOPROL-XL) 25 MG 24 hr tablet, Take 0.5 tablets (12.5 mg total) by mouth daily., Disp: 30 tablet, Rfl: 11 .  nitroGLYCERIN (NITROLINGUAL) 0.4 MG/SPRAY spray, Place 1 spray under the tongue every 5 (five) minutes as needed for chest pain., Disp: 12 g, Rfl: 3 .  omega-3 acid ethyl esters (LOVAZA) 1 g capsule, TAKE 1 CAPSULE (1 G TOTAL) BY MOUTH 4 (FOUR) TIMES DAILY., Disp: 120 capsule, Rfl: 4 .  pantoprazole (PROTONIX) 40 MG tablet, Take 1 tablet (40 mg total) by mouth daily., Disp: 90 tablet, Rfl: 3 .  PATADAY 0.2 % SOLN, Place 1 drop into both eyes 2 (two) times daily. , Disp: , Rfl: 6 .  ranolazine (RANEXA) 500 MG 12 hr tablet, TAKE 1 TABLET (500 MG TOTAL) BY MOUTH 2 (TWO) TIMES DAILY., Disp: 60 tablet, Rfl: 6 .  rosuvastatin (CRESTOR) 20 MG tablet, Take 1 tablet (20 mg total) by mouth daily., Disp: 90 tablet, Rfl: 3 .  vitamin C (ASCORBIC ACID) 500 MG tablet, Take 1,000 mg by mouth daily. , Disp: , Rfl:  Social History   Socioeconomic  History  . Marital status: Married    Spouse name: [name]   . Number of children: 3  . Years of education: 51  . Highest education level: Not on file  Occupational History  . Occupation: Retired  Scientific laboratory technician  . Financial resource strain: Not on file  . Food insecurity:    Worry: Not on file    Inability: Not on file  . Transportation needs:    Medical: Not on file    Non-medical: Not on file  Tobacco Use  . Smoking status: Former Smoker    Packs/day: 0.50    Years: 49.00    Pack years: 24.50    Types: Cigarettes    Last attempt to quit: 10/04/2009    Years since quitting: 8.8  . Smokeless tobacco: Never Used  Substance and Sexual Activity  . Alcohol use: Yes    Alcohol/week: 1.0 standard drinks    Types: 1 Glasses of wine per week    Comment: red wine once per month  . Drug use: No    . Sexual activity: Not Currently    Birth control/protection: Surgical, Post-menopausal  Lifestyle  . Physical activity:    Days per week: Not on file    Minutes per session: Not on file  . Stress: Not on file  Relationships  . Social connections:    Talks on phone: Not on file    Gets together: Not on file    Attends religious service: Not on file    Active member of club or organization: Not on file    Attends meetings of clubs or organizations: Not on file    Relationship status: Not on file  . Intimate partner violence:    Fear of current or ex partner: Not on file    Emotionally abused: Not on file    Physically abused: Not on file    Forced sexual activity: Not on file  Other Topics Concern  . Not on file  Social History Narrative   Lives w/ husband   Caffeine use: 3 cups coffee/day   Family History  Problem Relation Age of Onset  . Stroke Mother   . Diabetes Mother   . Kidney disease Mother   . Hypertension Mother   . Hyperlipidemia Mother   . Dementia Mother   . Heart disease Daughter        heart attack x2  . Heart disease Brother        2 heart attacks  . Breast cancer Maternal Aunt   . Colon cancer Neg Hx   . Stomach cancer Neg Hx   . Esophageal cancer Neg Hx   . Rectal cancer Neg Hx   . Liver cancer Neg Hx     Objective: Office vital signs reviewed. BP (!) 127/42   Pulse (!) 56   Temp 97.7 F (36.5 C) (Oral)   Ht 5\' 2"  (1.575 m)   Wt 153 lb 3.2 oz (69.5 kg)   SpO2 99%   BMI 28.02 kg/m   Physical Examination:  General: Awake, alert, well nourished, well appearing. No acute distress HEENT: Normal    Neck: No masses palpated. No lymphadenopathy    Ears: Tympanic membranes intact, normal light reflex, no erythema, no bulging    Eyes: PERRLA, extraocular membranes intact, sclera white    Nose: nasal turbinates moist, clear nasal discharge; hemostatic bleed in left nare    Throat: moist mucus membranes, no erythema, no tonsillar exudate.   Airway is patent Cardio:  regular rate and rhythm, S1S2 heard, no murmurs appreciated Pulm: clear to auscultation bilaterally, no wheezes, rhonchi or rales; normal work of breathing on room air  Assessment/ Plan: 72 y.o. female   1. Viral URI with cough Patient is afebrile nontoxic-appearing with normal pulse ox on room air.  Physical exam was notable for hemostatic bleed in the left nare.  Nothing on exam to suggest bacterial infection.  No evidence of dehydration.  Likely viral infection.  I recommended supportive care.  She has Best boy at home, which she can use.  I encouraged her to take these up to 3 times daily if needed for cough.  Push oral fluids.  May continue Tylenol.  Avoid NSAIDs given chronic anticoagulation with Eliquis.  Okay to add Claritin if needed for postnasal drip.  We discussed reasons for return including worsening of symptoms, prolonged symptoms or red flag symptoms.  She was good understanding of follow-up PRN.   No orders of the defined types were placed in this encounter.  No orders of the defined types were placed in this encounter.    [name], DO [place] [phone]

## 2018-08-26 NOTE — Patient Instructions (Signed)
I think that this is a cold virus.  If anything gets worse or you get any of the symptoms we talked about (blood in your cough, shortness of breath, fevers), call the office.  Start taking the benzonatate capsules that you have at home.  You may use these up to 3 times a day if you need them for coughing.  Get Mucinex (generic is called guaifenesin) and take this for chest congestion.  This will help you get the phlegm out of your chest.  If you are having a runny nose or lots of sneezing, you can take Claritin (generic is called loratadine) once a day.  It appears that you have a viral upper respiratory infection (cold).  Cold symptoms can last up to 2 weeks but most symptoms start improving after just a few days.   - Get plenty of rest and drink plenty of fluids. - Try to breathe moist air. Use a cold mist humidifier. - Consume warm fluids (soup or tea) to provide relief for a stuffy nose and to loosen phlegm. - For nasal stuffiness, try saline nasal spray or a Neti Pot.  Afrin nasal spray can also be used but this product should not be used longer than 3 days or it will cause rebound nasal stuffiness (worsening nasal congestion). - For sore throat pain relief: use chloraseptic spray, suck on throat lozenges, hard candy or popsicles; gargle with warm salt water (1/4 tsp. salt per 8 oz. of water); and eat soft, bland foods. - Eat a well-balanced diet. If you cannot, ensure you are getting enough nutrients by taking a daily multivitamin. - Avoid dairy products, as they can thicken phlegm. - Avoid alcohol, as it impairs your body's immune system.  CONTACT YOUR DOCTOR IF YOU EXPERIENCE ANY OF THE FOLLOWING: - High fever - Ear pain - Sinus-type headache - Unusually severe cold symptoms - Cough that gets worse while other cold symptoms improve - Flare up of any chronic lung problem, such as asthma - Your symptoms persist longer than 2 weeks

## 2018-09-11 ENCOUNTER — Ambulatory Visit (INDEPENDENT_AMBULATORY_CARE_PROVIDER_SITE_OTHER): Payer: Medicare HMO | Admitting: Otolaryngology

## 2018-09-11 DIAGNOSIS — R040 Epistaxis: Secondary | ICD-10-CM | POA: Diagnosis not present

## 2018-09-11 DIAGNOSIS — H6123 Impacted cerumen, bilateral: Secondary | ICD-10-CM

## 2018-09-29 ENCOUNTER — Other Ambulatory Visit: Payer: Self-pay | Admitting: Family Medicine

## 2018-09-29 ENCOUNTER — Telehealth: Payer: Self-pay | Admitting: Cardiovascular Disease

## 2018-09-29 DIAGNOSIS — Z1231 Encounter for screening mammogram for malignant neoplasm of breast: Secondary | ICD-10-CM

## 2018-09-29 NOTE — Telephone Encounter (Signed)
New message      *STAT* If patient is at the pharmacy, call can be transferred to refill team.   1. Which medications need to be refilled? (please list name of each medication and dose if known) rosuvastatin (CRESTOR) 20 MG tablet  2. Which pharmacy/location (including street and city if local pharmacy) is medication to be sent to? [id number] - [place], [place] - [address]   3. Do they need a 30 day or 90 day supply? 90  Pt is confused about the difference between crestor and (ranolazine er 500 mg /this medication makes pt vomit)

## 2018-10-01 ENCOUNTER — Other Ambulatory Visit: Payer: Self-pay | Admitting: Cardiovascular Disease

## 2018-10-02 MED ORDER — ROSUVASTATIN CALCIUM 20 MG PO TABS: 20.0000 mg | ORAL_TABLET | Freq: Every day | ORAL | 0 refills | Status: DC

## 2018-10-04 NOTE — Telephone Encounter (Signed)
If she's sure that she is intolerant to ranexa she can stop it.  JJB

## 2018-10-06 MED ORDER — RANEXA 500 MG PO TB12: 500.0000 mg | ORAL_TABLET | Freq: Two times a day (BID) | ORAL | 3 refills | Status: DC

## 2018-10-06 MED ORDER — CRESTOR 20 MG PO TABS: 20.0000 mg | ORAL_TABLET | Freq: Every day | ORAL | 3 refills | Status: DC

## 2018-10-06 NOTE — Telephone Encounter (Addendum)
Spoke with pt, she is not able to take the generic ranexa or generic crestor. Refills for brand name sent to the pharmacy.

## 2018-10-06 NOTE — Addendum Note (Signed)
Addended by: Cristopher Estimable on: 10/06/2018 11:42 AM   Modules accepted: Orders

## 2018-10-08 ENCOUNTER — Encounter: Payer: Self-pay | Admitting: Cardiology

## 2018-10-08 DIAGNOSIS — Z7901 Long term (current) use of anticoagulants: Secondary | ICD-10-CM | POA: Insufficient documentation

## 2018-10-09 ENCOUNTER — Encounter: Payer: Self-pay | Admitting: Cardiology

## 2018-10-09 ENCOUNTER — Ambulatory Visit: Payer: Medicare HMO | Admitting: Cardiology

## 2018-10-09 DIAGNOSIS — N183 Chronic kidney disease, stage 3 unspecified: Secondary | ICD-10-CM

## 2018-10-09 DIAGNOSIS — I10 Essential (primary) hypertension: Secondary | ICD-10-CM | POA: Diagnosis not present

## 2018-10-09 DIAGNOSIS — R079 Chest pain, unspecified: Secondary | ICD-10-CM | POA: Insufficient documentation

## 2018-10-09 DIAGNOSIS — Z951 Presence of aortocoronary bypass graft: Secondary | ICD-10-CM

## 2018-10-09 DIAGNOSIS — R0789 Other chest pain: Secondary | ICD-10-CM

## 2018-10-09 DIAGNOSIS — E785 Hyperlipidemia, unspecified: Secondary | ICD-10-CM

## 2018-10-09 DIAGNOSIS — I4819 Other persistent atrial fibrillation: Secondary | ICD-10-CM

## 2018-10-09 NOTE — Assessment & Plan Note (Signed)
Seen today after she had an episode of atypical chest pain- Lt upper quadrant she took 1 NTG with relief.

## 2018-10-09 NOTE — Assessment & Plan Note (Addendum)
GFR 50's- she drinks 4L of water a day

## 2018-10-09 NOTE — Assessment & Plan Note (Signed)
NSR after DCCV July 2018- on Eliquis-holding NSR

## 2018-10-09 NOTE — Progress Notes (Signed)
10/09/2018 Kristina Gibbs   Feb 25, 1946  [id number]  Primary Physician Kristina Norlander, DO Primary Cardiologist: Dr Kristina Gibbs  HPI: Ms. Kristina Gibbs is a delightful 73 year old female, previously followed by Dr. Rollene Gibbs, now followed by Dr. Gwenlyn Gibbs.  She had a remote PCI in 1995.  In March 2000 she had bypass grafting with an LIMA to LAD, SVG to OM, and SVG to RCA.  Catheterization in 2010 revealed occluded vein grafts with a patent LIMA to LAD and normal LV function.  She was treated medically and has done well.  Myoview in June 2018 was low risk.  Other medical problems include PAF in 2018, hypertension, and dyslipidemia.  She has been holding normal sinus rhythm after cardioversion.  She is on chronic Eliquis.  She is in the office today after she had an episode of chest pain last night.  Patient says she was in bed and she developed some left-sided chest pain.  She became alarmed and took a nitroglycerin spray.  She said when she got up she realized the left-sided chest pain was at the left lower lateral aspect of her chest, right at the end of her ribs.  Her symptoms went away.  She is in the office today for further evaluation.  She not had exertional chest pain or unusual dyspnea.  She is not had tachycardia.  Her EKG shows sinus rhythm with a Q wave in V2.  She now thinks her symptoms were atypical for angina.   Current Outpatient Medications  Medication Sig Dispense Refill  . alendronate (FOSAMAX) 70 MG tablet TAKE 1 TABLET BY MOUTH ONCE A WEEK. TAKE WITH A FULL GLASS OF WATER ON AN EMPTY STOMACH. 12 tablet 1  . AMITIZA 24 MCG capsule TAKE 1 CAPSULE BY MOUTH TWO TIMES DAILY WITH MEALS (Patient taking differently: take 1 capsule by mouth every other day) 180 capsule 1  . amLODipine (NORVASC) 5 MG tablet TAKE 5 MG BY MOUTH EVERY DAY 90 tablet 1  . apixaban (ELIQUIS) 5 MG TABS tablet Take 1 tablet (5 mg total) by mouth 2 (two) times daily. 180 tablet 1  . aspirin EC 81 MG tablet Take 81 mg  by mouth at bedtime.     Marland Kitchen b complex vitamins tablet Take 1 tablet by mouth daily.     . calcium carbonate (OS-CAL) 600 MG TABS Take 600 mg by mouth daily.    . cholecalciferol (VITAMIN D) 1000 UNITS tablet Take 1,000 Units by mouth daily.    . CO ENZYME Q-10 PO Take 1 capsule by mouth daily.     . CRESTOR 20 MG tablet Take 1 tablet (20 mg total) by mouth daily. 90 tablet 3  . irbesartan (AVAPRO) 75 MG tablet TAKE 1 TABLET (75 MG TOTAL) BY MOUTH AT BEDTIME. 30 tablet 9  . metoprolol succinate (TOPROL-XL) 25 MG 24 hr tablet Take 0.5 tablets (12.5 mg total) by mouth daily. 30 tablet 11  . nitroGLYCERIN (NITROLINGUAL) 0.4 MG/SPRAY spray Place 1 spray under the tongue every 5 (five) minutes as needed for chest pain. 12 g 3  . omega-3 acid ethyl esters (LOVAZA) 1 g capsule TAKE 1 CAPSULE (1 G TOTAL) BY MOUTH 4 (FOUR) TIMES DAILY. 360 capsule 1  . pantoprazole (PROTONIX) 40 MG tablet Take 1 tablet (40 mg total) by mouth daily. 90 tablet 3  . PATADAY 0.2 % SOLN Place 1 drop into both eyes 2 (two) times daily.   6  . RANEXA 500 MG 12 hr tablet Take  1 tablet (500 mg total) by mouth 2 (two) times daily. 180 tablet 3  . vitamin C (ASCORBIC ACID) 500 MG tablet Take 1,000 mg by mouth daily.      No current facility-administered medications for this visit.     Allergies  Allergen Reactions  . Lactose Intolerance (Gi) Nausea And Vomiting and Nausea Only  . Codeine Nausea And Vomiting  . Hydrocortisone Itching    Past Medical History:  Diagnosis Date  . Allergy   . Arthritis   . Cancer ([place])    ovarian  . Coronary artery disease   . Heart murmur   . Hypercholesteremia   . Hypertension   . 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 Osteopenia   . Stomach problems   . Unstable angina ([place]) 05/27/2014    Social History   Socioeconomic History  . Marital status: Married    Spouse name: Kristina Gibbs   . Number of children: 3  . Years of education: 75  . Highest education level: Not on file    Occupational History  . Occupation: Retired  Scientific laboratory technician  . Financial resource strain: Not on file  . Food insecurity:    Worry: Not on file    Inability: Not on file  . Transportation needs:    Medical: Not on file    Non-medical: Not on file  Tobacco Use  . Smoking status: Former Smoker    Packs/day: 0.50    Years: 49.00    Pack years: 24.50    Types: Cigarettes    Last attempt to quit: 10/04/2009    Years since quitting: 9.0  . Smokeless tobacco: Never Used  Substance and Sexual Activity  . Alcohol use: Yes    Alcohol/week: 1.0 standard drinks    Types: 1 Glasses of wine per week    Comment: red wine once per month  . Drug use: No  . Sexual activity: Not Currently    Birth control/protection: Surgical, Post-menopausal  Lifestyle  . Physical activity:    Days per week: Not on file    Minutes per session: Not on file  . Stress: Not on file  Relationships  . Social connections:    Talks on phone: Not on file    Gets together: Not on file    Attends religious service: Not on file    Active member of club or organization: Not on file    Attends meetings of clubs or organizations: Not on file    Relationship status: Not on file  . Intimate partner violence:    Fear of current or ex partner: Not on file    Emotionally abused: Not on file    Physically abused: Not on file    Forced sexual activity: Not on file  Other Topics Concern  . Not on file  Social History Narrative   Lives w/ husband   Caffeine use: 3 cups coffee/day     Family History  Problem Relation Age of Onset  . Stroke Mother   . Diabetes Mother   . Kidney disease Mother   . Hypertension Mother   . Hyperlipidemia Mother   . Dementia Mother   . Heart disease Daughter        heart attack x2  . Heart disease Brother        2 heart attacks  . Breast cancer Maternal Aunt   . Colon cancer Neg Hx   . Stomach cancer Neg Hx   . Esophageal cancer  Neg Hx   . Rectal cancer Neg Hx   . Liver cancer Neg  Hx      Review of Systems: General: negative for chills, fever, night sweats or weight changes.  Cardiovascular: negative for dyspnea on exertion, edema, orthopnea, palpitations, paroxysmal nocturnal dyspnea or shortness of breath Dermatological: negative for rash Respiratory: negative for cough or wheezing Urologic: negative for hematuria Abdominal: negative for nausea, vomiting, diarrhea, bright red blood per rectum, melena, or hematemesis Neurologic: negative for visual changes, syncope, or dizziness All other systems reviewed and are otherwise negative except as noted above.    Blood pressure (!) 151/60, pulse 61, height 5\' 2"  (1.575 m), weight 151 lb (68.5 kg).  General appearance: alert, cooperative and no distress Neck: no carotid bruit and no JVD Lungs: clear to auscultation bilaterally Heart: regular rate and rhythm Extremities: no edema Neurologic: Grossly normal  EKG NSR, Q in V2  ASSESSMENT AND PLAN:   Chest pain Seen today after she had an episode of atypical chest pain- Lt upper quadrant she took 1 NTG with relief.   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  Cath 2010- occluded SVGs, patent LIMA- medical Rx Chest pain June 2018- Myoview low risk- medical Rx   Persistent atrial fibrillation NSR after DCCV July 2018- on Eliquis-holding NSR  Dyslipidemia, goal LDL below 70 LDL 69 June 2019-on Crestor  Essential hypertension Controlled  Chronic kidney disease (CKD), stage III (moderate) (HCC) GFR 50's- she drinks 4L of water a day   PLAN  Same Rx- reassured her symptoms did not sound like angina. F/U Dr Kristina Gibbs in a year.   Kerin Ransom PA-C 10/09/2018 10:48 AM

## 2018-10-09 NOTE — Assessment & Plan Note (Signed)
LDL 69 June 2019-on Crestor

## 2018-10-09 NOTE — Assessment & Plan Note (Signed)
CABG March of 2000 - LIMA to LAD, vein to a marginal branch of the circumflex and distal right coronary artery.  Cath 2010- occluded SVGs, patent LIMA- medical Rx Chest pain June 2018- Myoview low risk- medical Rx

## 2018-10-09 NOTE — Assessment & Plan Note (Signed)
Controlled.  

## 2018-10-09 NOTE — Patient Instructions (Signed)
Medication Instructions:  Your physician recommends that you continue on your current medications as directed. Please refer to the Current Medication list given to you today. If you need a refill on your cardiac medications before your next appointment, please call your pharmacy.   Lab work: None  If you have labs (blood work) drawn today and your tests are completely normal, you will receive your results only by: . MyChart Message (if you have MyChart) OR . A paper copy in the mail If you have any lab test that is abnormal or we need to change your treatment, we will call you to review the results.  Testing/Procedures: None   Follow-Up: At [facility], you and your health needs are our priority.  As part of our continuing mission to provide you with exceptional heart care, we have created designated Provider Care Teams.  These Care Teams include your primary Cardiologist (physician) and Advanced Practice Providers (APPs -  Physician Assistants and Nurse Practitioners) who all work together to provide you with the care you need, when you need it. You will need a follow up appointment in 12 months.  Please call our office 2 months in advance to schedule this appointment.  You may see Dr Jonathan Berry or one of the following Advanced Practice Providers on your designated Care Team:   Luke Kilroy, PA-C Krista Kroeger, PA-C . Callie Goodrich, PA-C  Any Other Special Instructions Will Be Listed Below (If Applicable).   

## 2018-10-16 ENCOUNTER — Ambulatory Visit (INDEPENDENT_AMBULATORY_CARE_PROVIDER_SITE_OTHER): Payer: Medicare HMO | Admitting: Otolaryngology

## 2018-10-16 DIAGNOSIS — R040 Epistaxis: Secondary | ICD-10-CM | POA: Diagnosis not present

## 2018-10-16 DIAGNOSIS — H6123 Impacted cerumen, bilateral: Secondary | ICD-10-CM

## 2018-10-23 ENCOUNTER — Ambulatory Visit (INDEPENDENT_AMBULATORY_CARE_PROVIDER_SITE_OTHER): Payer: Medicare HMO | Admitting: Family Medicine

## 2018-10-23 VITALS — BP 118/48 | HR 54 | Temp 98.1°F | Ht 62.0 in | Wt 151.0 lb

## 2018-10-23 DIAGNOSIS — Z7901 Long term (current) use of anticoagulants: Secondary | ICD-10-CM | POA: Diagnosis not present

## 2018-10-23 DIAGNOSIS — E785 Hyperlipidemia, unspecified: Secondary | ICD-10-CM

## 2018-10-23 DIAGNOSIS — N183 Chronic kidney disease, stage 3 unspecified: Secondary | ICD-10-CM

## 2018-10-23 DIAGNOSIS — I10 Essential (primary) hypertension: Secondary | ICD-10-CM

## 2018-10-23 DIAGNOSIS — I2510 Atherosclerotic heart disease of native coronary artery without angina pectoris: Secondary | ICD-10-CM | POA: Diagnosis not present

## 2018-10-23 DIAGNOSIS — I4819 Other persistent atrial fibrillation: Secondary | ICD-10-CM | POA: Diagnosis not present

## 2018-10-23 DIAGNOSIS — Z9861 Coronary angioplasty status: Secondary | ICD-10-CM | POA: Diagnosis not present

## 2018-10-23 MED ORDER — METOPROLOL SUCCINATE ER 25 MG PO TB24: 12.5000 mg | ORAL_TABLET | Freq: Every day | ORAL | 3 refills | Status: DC

## 2018-10-23 MED ORDER — IRBESARTAN 75 MG PO TABS: ORAL_TABLET | ORAL | 3 refills | Status: DC

## 2018-10-23 MED ORDER — APIXABAN 5 MG PO TABS: 5.0000 mg | ORAL_TABLET | Freq: Two times a day (BID) | ORAL | 1 refills | Status: DC

## 2018-10-23 MED ORDER — AMLODIPINE BESYLATE 5 MG PO TABS: ORAL_TABLET | ORAL | 1 refills | Status: DC

## 2018-10-23 NOTE — Patient Instructions (Signed)
You had labs performed today.  You will be contacted with the results of the labs once they are available, usually in the next 3 business days for routine lab work.   Keep an eye on your blood pressures.  Your blood pressure was a little low today but I think this is because you have not eaten or had anything to drink today.  Since they are running you normal after eating and drinking, we will continue your current regimen.  However, if the blood pressures remain low despite your normal eating routine, we should see each other back sooner than 4 months to adjust your blood pressure medicines.

## 2018-10-23 NOTE — Progress Notes (Signed)
Subjective: CC: HTN, HLD, Afib PCP: [name], DO Kristina Gibbs is a 73 y.o. female presenting to clinic today for:  1. HTN, HLD, Afib Sees Dr Gwenlyn Found for cardiology.  She reports compliance with Eliquis, Metoprolol XL 25, Norvasc 5, Avapro 75, Crestor 20 and Ranexa.  She denies any chest pain, shortness of breath, heart palpitations, loss of consciousness.  No recurrent epistaxis.  She saw ear nose and throat who put her on a spray that resolved her symptoms.  She denies any bleeding from other orifices.  No melena, hematochezia, vaginal bleeding, or hematuria.  She has occasional ankle swelling but she thinks this may be related to her concrete floors at home.  She does report some lightheadedness with position changes early in the morning before she eats.  Her blood pressures typically run 110s over 40s during that time and increased to "normal range" after eating and drinking in the morning.  She describes normal range as 120s over 60s.  Additionally, she wants to have labs done today because she is fasting this morning.   ROS: Per HPI  Allergies  Allergen Reactions  . Lactose Intolerance (Gi) Nausea And Vomiting and Nausea Only  . Codeine Nausea And Vomiting  . Hydrocortisone Itching   Past Medical History:  Diagnosis Date  . Allergy   . Arthritis   . Cancer ([place])    ovarian  . Coronary artery disease   . Heart murmur   . Hypercholesteremia   . Hypertension   . 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 Osteopenia   . Stomach problems   . Unstable angina ([place]) 05/27/2014    Current Outpatient Medications:  .  alendronate (FOSAMAX) 70 MG tablet, TAKE 1 TABLET BY MOUTH ONCE A WEEK. TAKE WITH A FULL GLASS OF WATER ON AN EMPTY STOMACH., Disp: 12 tablet, Rfl: 1 .  AMITIZA 24 MCG capsule, TAKE 1 CAPSULE BY MOUTH TWO TIMES DAILY WITH MEALS (Patient taking differently: take 1 capsule by mouth every other day), Disp: 180 capsule, Rfl: 1 .  amLODipine (NORVASC) 5 MG  tablet, TAKE 5 MG BY MOUTH EVERY DAY, Disp: 90 tablet, Rfl: 1 .  apixaban (ELIQUIS) 5 MG TABS tablet, Take 1 tablet (5 mg total) by mouth 2 (two) times daily., Disp: 180 tablet, Rfl: 1 .  aspirin EC 81 MG tablet, Take 81 mg by mouth at bedtime. , Disp: , Rfl:  .  b complex vitamins tablet, Take 1 tablet by mouth daily. , Disp: , Rfl:  .  calcium carbonate (OS-CAL) 600 MG TABS, Take 600 mg by mouth daily., Disp: , Rfl:  .  cholecalciferol (VITAMIN D) 1000 UNITS tablet, Take 1,000 Units by mouth daily., Disp: , Rfl:  .  CO ENZYME Q-10 PO, Take 1 capsule by mouth daily. , Disp: , Rfl:  .  CRESTOR 20 MG tablet, Take 1 tablet (20 mg total) by mouth daily., Disp: 90 tablet, Rfl: 3 .  irbesartan (AVAPRO) 75 MG tablet, TAKE 1 TABLET (75 MG TOTAL) BY MOUTH AT BEDTIME., Disp: 30 tablet, Rfl: 9 .  metoprolol succinate (TOPROL-XL) 25 MG 24 hr tablet, Take 0.5 tablets (12.5 mg total) by mouth daily., Disp: 30 tablet, Rfl: 11 .  nitroGLYCERIN (NITROLINGUAL) 0.4 MG/SPRAY spray, Place 1 spray under the tongue every 5 (five) minutes as needed for chest pain., Disp: 12 g, Rfl: 3 .  omega-3 acid ethyl esters (LOVAZA) 1 g capsule, TAKE 1 CAPSULE (1 G TOTAL) BY MOUTH 4 (  FOUR) TIMES DAILY., Disp: 360 capsule, Rfl: 1 .  pantoprazole (PROTONIX) 40 MG tablet, Take 1 tablet (40 mg total) by mouth daily., Disp: 90 tablet, Rfl: 3 .  PATADAY 0.2 % SOLN, Place 1 drop into both eyes 2 (two) times daily. , Disp: , Rfl: 6 .  RANEXA 500 MG 12 hr tablet, Take 1 tablet (500 mg total) by mouth 2 (two) times daily., Disp: 180 tablet, Rfl: 3 .  vitamin C (ASCORBIC ACID) 500 MG tablet, Take 1,000 mg by mouth daily. , Disp: , Rfl:  Social History   Socioeconomic History  . Marital status: Married    Spouse name: [name]   . Number of children: 3  . Years of education: 61  . Highest education level: Not on file  Occupational History  . Occupation: Retired  Scientific laboratory technician  . Financial resource strain: Not on file  . Food insecurity:      Worry: Not on file    Inability: Not on file  . Transportation needs:    Medical: Not on file    Non-medical: Not on file  Tobacco Use  . Smoking status: Former Smoker    Packs/day: 0.50    Years: 49.00    Pack years: 24.50    Types: Cigarettes    Last attempt to quit: 10/04/2009    Years since quitting: 9.0  . Smokeless tobacco: Never Used  Substance and Sexual Activity  . Alcohol use: Yes    Alcohol/week: 1.0 standard drinks    Types: 1 Glasses of wine per week    Comment: red wine once per month  . Drug use: No  . Sexual activity: Not Currently    Birth control/protection: Surgical, Post-menopausal  Lifestyle  . Physical activity:    Days per week: Not on file    Minutes per session: Not on file  . Stress: Not on file  Relationships  . Social connections:    Talks on phone: Not on file    Gets together: Not on file    Attends religious service: Not on file    Active member of club or organization: Not on file    Attends meetings of clubs or organizations: Not on file    Relationship status: Not on file  . Intimate partner violence:    Fear of current or ex partner: Not on file    Emotionally abused: Not on file    Physically abused: Not on file    Forced sexual activity: Not on file  Other Topics Concern  . Not on file  Social History Narrative   Lives w/ husband   Caffeine use: 3 cups coffee/day   Family History  Problem Relation Age of Onset  . Stroke Mother   . Diabetes Mother   . Kidney disease Mother   . Hypertension Mother   . Hyperlipidemia Mother   . Dementia Mother   . Heart disease Daughter        heart attack x2  . Heart disease Brother        2 heart attacks  . Breast cancer Maternal Aunt   . Colon cancer Neg Hx   . Stomach cancer Neg Hx   . Esophageal cancer Neg Hx   . Rectal cancer Neg Hx   . Liver cancer Neg Hx     Objective: Office vital signs reviewed. BP (!) 118/48 Comment: manual. has not eaten/ drank anything  Pulse (!) 54    Temp 98.1 F (36.7 C) (Oral)  Ht 5' 2"  (1.575 m)   Wt 151 lb (68.5 kg)   BMI 27.62 kg/m   Physical Examination:  General: Awake, alert, well nourished, No acute distress HEENT: Normal, sclera white, MMM Cardio: Bradycardic with irregularly irregular rhythm, S1S2 heard, no murmurs appreciated Pulm: clear to auscultation bilaterally, no wheezes, rhonchi or rales; normal work of breathing on room air Ext: No edema.  Assessment/ Plan: 73 y.o. female   1. Essential hypertension Diastolic on the low side.  We discussed that if her blood pressure remained low despite eating or drinking that we should consider re-check and adjustment of blood pressure medications.  I suspect some of the dizziness that she is experiencing is orthostatic hypotension related to low volume in the mornings.  She is currently asymptomatic.  Check lipid and CMP. - Lipid Panel - CMP14+EGFR  2. CAD S/P percutaneous coronary angioplasty - Lipid Panel - CMP14+EGFR  3. Persistent atrial fibrillation Rate controlled  4. Chronic kidney disease (CKD), stage III (moderate) (HCC) - CMP14+EGFR  5. Dyslipidemia, goal LDL below 70 - Lipid Panel  6. Chronic anticoagulation No active bleeds. - CBC   Orders Placed This Encounter  Procedures  . Lipid Panel  . CMP14+EGFR  . CBC   Meds ordered this encounter  Medications  . amLODipine (NORVASC) 5 MG tablet    Sig: TAKE 5 MG BY MOUTH EVERY DAY    Dispense:  90 tablet    Refill:  1  . apixaban (ELIQUIS) 5 MG TABS tablet    Sig: Take 1 tablet (5 mg total) by mouth 2 (two) times daily.    Dispense:  180 tablet    Refill:  1  . irbesartan (AVAPRO) 75 MG tablet    Sig: TAKE 1 TABLET (75 MG TOTAL) BY MOUTH AT BEDTIME.    Dispense:  90 tablet    Refill:  3  . metoprolol succinate (TOPROL-XL) 25 MG 24 hr tablet    Sig: Take 0.5 tablets (12.5 mg total) by mouth daily.    Dispense:  90 tablet    Refill:  [place], [place] [phone]

## 2018-10-24 LAB — CMP14+EGFR
ALT: 20 IU/L (ref 0–32)
AST: 21 IU/L (ref 0–40)
Albumin/Globulin Ratio: 1.2 (ref 1.2–2.2)
Albumin: 4.1 g/dL (ref 3.7–4.7)
Alkaline Phosphatase: 56 IU/L (ref 39–117)
BUN/Creatinine Ratio: 13 (ref 12–28)
BUN: 15 mg/dL (ref 8–27)
Bilirubin Total: 0.7 mg/dL (ref 0.0–1.2)
CO2: 25 mmol/L (ref 20–29)
CREATININE: 1.12 mg/dL — AB (ref 0.57–1.00)
Calcium: 9.6 mg/dL (ref 8.7–10.3)
Chloride: 95 mmol/L — ABNORMAL LOW (ref 96–106)
GFR calc Af Amer: 56 mL/min/{1.73_m2} — ABNORMAL LOW (ref >59)
GFR calc non Af Amer: 49 mL/min/{1.73_m2} — ABNORMAL LOW (ref >59)
Globulin, Total: 3.3 g/dL (ref 1.5–4.5)
Glucose: 98 mg/dL (ref 65–99)
Potassium: 5 mmol/L (ref 3.5–5.2)
Sodium: 134 mmol/L (ref 134–144)
TOTAL PROTEIN: 7.4 g/dL (ref 6.0–8.5)

## 2018-10-24 LAB — CBC
HEMATOCRIT: 38.3 % (ref 34.0–46.6)
Hemoglobin: 12.6 g/dL (ref 11.1–15.9)
MCH: 29.4 pg (ref 26.6–33.0)
MCHC: 32.9 g/dL (ref 31.5–35.7)
MCV: 90 fL (ref 79–97)
Platelets: 271 10*3/uL (ref 150–450)
RBC: 4.28 x10E6/uL (ref 3.77–5.28)
RDW: 12.8 % (ref 11.7–15.4)
WBC: 5.8 10*3/uL (ref 3.4–10.8)

## 2018-10-24 LAB — LIPID PANEL
Chol/HDL Ratio: 1.9 ratio (ref 0.0–4.4)
Cholesterol, Total: 162 mg/dL (ref 100–199)
HDL: 86 mg/dL (ref >39)
LDL Calculated: 63 mg/dL (ref 0–99)
Triglycerides: 64 mg/dL (ref 0–149)
VLDL Cholesterol Cal: 13 mg/dL (ref 5–40)

## 2018-10-26 ENCOUNTER — Telehealth: Payer: Self-pay | Admitting: Family Medicine

## 2018-10-26 NOTE — Telephone Encounter (Signed)
Her diastolic is on the low side.  Are these readings after she has eaten?  If so, I'd like her to cut the amlodipine in 1/2 and continue monitoring.

## 2018-10-26 NOTE — Telephone Encounter (Signed)
BP was low at appt -  She is concerned about all of her BP meds: She is on  Amlodipine 5 QD Metoporolol 12.5 OD Irbesartan 75 QD  Home readings: 122/45 -54 105/42 -55 122/47 - 97  Aware you will review and we can call her back

## 2018-10-27 NOTE — Telephone Encounter (Signed)
Pt states the home readings were taken after she had eaten her supper. Advised pt to take 1/2 AMlodipine per Dr Synthia Innocent notes and to monitor readings. Advised if the readings shoot up or continue to stay low to call us back. Pt voiced understanding.

## 2018-10-27 NOTE — Telephone Encounter (Signed)
rtn call °

## 2018-10-30 ENCOUNTER — Other Ambulatory Visit: Payer: Self-pay | Admitting: Cardiovascular Disease

## 2018-10-30 DIAGNOSIS — M810 Age-related osteoporosis without current pathological fracture: Secondary | ICD-10-CM | POA: Diagnosis not present

## 2018-10-30 DIAGNOSIS — I252 Old myocardial infarction: Secondary | ICD-10-CM | POA: Diagnosis not present

## 2018-10-30 DIAGNOSIS — H1045 Other chronic allergic conjunctivitis: Secondary | ICD-10-CM | POA: Diagnosis not present

## 2018-10-30 DIAGNOSIS — H524 Presbyopia: Secondary | ICD-10-CM | POA: Diagnosis not present

## 2018-10-30 DIAGNOSIS — M1990 Unspecified osteoarthritis, unspecified site: Secondary | ICD-10-CM | POA: Diagnosis not present

## 2018-10-30 DIAGNOSIS — E785 Hyperlipidemia, unspecified: Secondary | ICD-10-CM | POA: Diagnosis not present

## 2018-10-30 DIAGNOSIS — Z961 Presence of intraocular lens: Secondary | ICD-10-CM | POA: Diagnosis not present

## 2018-10-30 DIAGNOSIS — K219 Gastro-esophageal reflux disease without esophagitis: Secondary | ICD-10-CM | POA: Diagnosis not present

## 2018-10-30 DIAGNOSIS — I25119 Atherosclerotic heart disease of native coronary artery with unspecified angina pectoris: Secondary | ICD-10-CM | POA: Diagnosis not present

## 2018-10-30 DIAGNOSIS — I4891 Unspecified atrial fibrillation: Secondary | ICD-10-CM | POA: Diagnosis not present

## 2018-10-30 DIAGNOSIS — I10 Essential (primary) hypertension: Secondary | ICD-10-CM | POA: Diagnosis not present

## 2018-10-30 DIAGNOSIS — K5900 Constipation, unspecified: Secondary | ICD-10-CM | POA: Diagnosis not present

## 2018-10-30 DIAGNOSIS — H04129 Dry eye syndrome of unspecified lacrimal gland: Secondary | ICD-10-CM | POA: Diagnosis not present

## 2018-10-31 ENCOUNTER — Ambulatory Visit
Admission: RE | Admit: 2018-10-31 | Discharge: 2018-10-31 | Disposition: A | Discharge status: Home / Self Care | Payer: Medicare HMO | Source: Ambulatory Visit | Attending: Family Medicine | Admitting: Family Medicine

## 2018-10-31 DIAGNOSIS — Z1231 Encounter for screening mammogram for malignant neoplasm of breast: Secondary | ICD-10-CM | POA: Diagnosis not present

## 2018-11-06 ENCOUNTER — Encounter: Payer: Self-pay | Admitting: *Deleted

## 2018-12-08 ENCOUNTER — Ambulatory Visit: Payer: Medicare HMO | Admitting: Cardiovascular Disease

## 2019-02-19 ENCOUNTER — Other Ambulatory Visit: Payer: Self-pay

## 2019-02-20 ENCOUNTER — Ambulatory Visit (INDEPENDENT_AMBULATORY_CARE_PROVIDER_SITE_OTHER): Payer: Medicare HMO | Admitting: Family Medicine

## 2019-02-20 ENCOUNTER — Telehealth: Payer: Self-pay | Admitting: Family Medicine

## 2019-02-20 ENCOUNTER — Encounter: Payer: Self-pay | Admitting: Family Medicine

## 2019-02-20 VITALS — BP 147/51 | HR 52 | Temp 97.0°F | Ht 62.0 in | Wt 152.0 lb

## 2019-02-20 DIAGNOSIS — Z7901 Long term (current) use of anticoagulants: Secondary | ICD-10-CM | POA: Diagnosis not present

## 2019-02-20 DIAGNOSIS — R4189 Other symptoms and signs involving cognitive functions and awareness: Secondary | ICD-10-CM

## 2019-02-20 DIAGNOSIS — N183 Chronic kidney disease, stage 3 unspecified: Secondary | ICD-10-CM

## 2019-02-20 DIAGNOSIS — R6889 Other general symptoms and signs: Secondary | ICD-10-CM | POA: Diagnosis not present

## 2019-02-20 DIAGNOSIS — I4819 Other persistent atrial fibrillation: Secondary | ICD-10-CM | POA: Diagnosis not present

## 2019-02-20 DIAGNOSIS — R079 Chest pain, unspecified: Secondary | ICD-10-CM | POA: Diagnosis not present

## 2019-02-20 DIAGNOSIS — I10 Essential (primary) hypertension: Secondary | ICD-10-CM

## 2019-02-20 NOTE — Telephone Encounter (Signed)
lmtcb

## 2019-02-20 NOTE — Telephone Encounter (Signed)
Patient had an appointment with you today - did you call patient and leave her a message?

## 2019-02-20 NOTE — Progress Notes (Signed)
Subjective: CC: HTN, HLD, Afib PCP: [name], DO ZOX:[id number] Kristina Gibbs is a 73 y.o. female presenting to clinic today for:  1. HTN, HLD, Afib History: Dr Gwenlyn Found for cardiology. PCI 1995. CABG 2000.  DCCV 2018.   She is compliant w/ Eliquis, Metoprolol XL 25, Norvasc 2.5mg , Avapro 75, Crestor 20 and Ranexa.  She reports some chest discomfort this morning that resulted in her feeling sweaty and nauseous.  Her symptoms resolved after about 20 minutes.  She took 3 sprays of her nitroglycerin and her aspirin along with her normal blood pressure medications this morning.  Denies heart palpitations, loss of consciousness, epistaxis, melena, hematochezia, vaginal bleeding, or hematuria. Her blood pressures have been running 140/50s at home.    2.  Memory loss Patient reports about a year history of memory loss.  She has not, loss or experience any incontinence but does report that she forgets names and normal daily things.  She would like to be evaluated for this.  ROS: Per HPI  Allergies  Allergen Reactions  . Lactose Intolerance (Gi) Nausea And Vomiting and Nausea Only  . Codeine Nausea And Vomiting  . Hydrocortisone Itching   Past Medical History:  Diagnosis Date  . Allergy   . Arthritis   . Cancer ([place])    ovarian  . Coronary artery disease   . Heart murmur   . Hypercholesteremia   . Hypertension   . 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 Osteopenia   . Stomach problems   . Unstable angina ([place]) 05/27/2014    Current Outpatient Medications:  .  alendronate (FOSAMAX) 70 MG tablet, TAKE 1 TABLET BY MOUTH ONCE A WEEK. TAKE WITH A FULL GLASS OF WATER ON AN EMPTY STOMACH., Disp: 12 tablet, Rfl: 1 .  AMITIZA 24 MCG capsule, TAKE 1 CAPSULE BY MOUTH TWO TIMES DAILY WITH MEALS (Patient taking differently: take 1 capsule by mouth every other day), Disp: 180 capsule, Rfl: 1 .  amLODipine (NORVASC) 5 MG tablet, TAKE 5 MG BY MOUTH EVERY DAY, Disp: 90 tablet, Rfl: 1 .  apixaban  (ELIQUIS) 5 MG TABS tablet, Take 1 tablet (5 mg total) by mouth 2 (two) times daily., Disp: 180 tablet, Rfl: 1 .  aspirin EC 81 MG tablet, Take 81 mg by mouth at bedtime. , Disp: , Rfl:  .  b complex vitamins tablet, Take 1 tablet by mouth daily. , Disp: , Rfl:  .  calcium carbonate (OS-CAL) 600 MG TABS, Take 600 mg by mouth daily., Disp: , Rfl:  .  cholecalciferol (VITAMIN D) 1000 UNITS tablet, Take 1,000 Units by mouth daily., Disp: , Rfl:  .  CO ENZYME Q-10 PO, Take 1 capsule by mouth daily. , Disp: , Rfl:  .  CRESTOR 20 MG tablet, Take 1 tablet (20 mg total) by mouth daily., Disp: 90 tablet, Rfl: 3 .  irbesartan (AVAPRO) 75 MG tablet, TAKE 1 TABLET (75 MG TOTAL) BY MOUTH AT BEDTIME., Disp: 90 tablet, Rfl: 3 .  metoprolol succinate (TOPROL-XL) 25 MG 24 hr tablet, Take 0.5 tablets (12.5 mg total) by mouth daily., Disp: 90 tablet, Rfl: 3 .  nitroGLYCERIN (NITROLINGUAL) 0.4 MG/SPRAY spray, Place 1 spray under the tongue every 5 (five) minutes as needed for chest pain., Disp: 12 g, Rfl: 3 .  omega-3 acid ethyl esters (LOVAZA) 1 g capsule, TAKE 1 CAPSULE (1 G TOTAL) BY MOUTH 4 (FOUR) TIMES DAILY., Disp: 360 capsule, Rfl: 1 .  pantoprazole (PROTONIX) 40 MG tablet,  Take 1 tablet (40 mg total) by mouth daily., Disp: 90 tablet, Rfl: 3 .  PATADAY 0.2 % SOLN, Place 1 drop into both eyes 2 (two) times daily. , Disp: , Rfl: 6 .  RANEXA 500 MG 12 hr tablet, TAKE 1 TABLET BY MOUTH TWICE A DAY, Disp: 60 tablet, Rfl: 6 .  vitamin C (ASCORBIC ACID) 500 MG tablet, Take 1,000 mg by mouth daily. , Disp: , Rfl:  Social History   Socioeconomic History  . Marital status: Married    Spouse name: [name]   . Number of children: 3  . Years of education: 30  . Highest education level: Not on file  Occupational History  . Occupation: Retired  Scientific laboratory technician  . Financial resource strain: Not on file  . Food insecurity:    Worry: Not on file    Inability: Not on file  . Transportation needs:    Medical: Not on file     Non-medical: Not on file  Tobacco Use  . Smoking status: Former Smoker    Packs/day: 0.50    Years: 49.00    Pack years: 24.50    Types: Cigarettes    Last attempt to quit: 10/04/2009    Years since quitting: 9.3  . Smokeless tobacco: Never Used  Substance and Sexual Activity  . Alcohol use: Yes    Alcohol/week: 1.0 standard drinks    Types: 1 Glasses of wine per week    Comment: red wine once per month  . Drug use: No  . Sexual activity: Not Currently    Birth control/protection: Surgical, Post-menopausal  Lifestyle  . Physical activity:    Days per week: Not on file    Minutes per session: Not on file  . Stress: Not on file  Relationships  . Social connections:    Talks on phone: Not on file    Gets together: Not on file    Attends religious service: Not on file    Active member of club or organization: Not on file    Attends meetings of clubs or organizations: Not on file    Relationship status: Not on file  . Intimate partner violence:    Fear of current or ex partner: Not on file    Emotionally abused: Not on file    Physically abused: Not on file    Forced sexual activity: Not on file  Other Topics Concern  . Not on file  Social History Narrative   Lives w/ husband   Caffeine use: 3 cups coffee/day   Family History  Problem Relation Age of Onset  . Stroke Mother   . Diabetes Mother   . Kidney disease Mother   . Hypertension Mother   . Hyperlipidemia Mother   . Dementia Mother   . Heart disease Daughter        heart attack x2  . Heart disease Brother        2 heart attacks  . Breast cancer Maternal Aunt   . Colon cancer Neg Hx   . Stomach cancer Neg Hx   . Esophageal cancer Neg Hx   . Rectal cancer Neg Hx   . Liver cancer Neg Hx     Objective: Office vital signs reviewed. BP (!) 147/51   Pulse (!) 52   Temp (!) 97 F (36.1 C) (Oral)   Ht 5\' 2"  (1.575 m)   Wt 152 lb (68.9 kg)   BMI 27.80 kg/m   Physical Examination:  General: Awake,  alert, well nourished, No acute distress HEENT: Normal, sclera white, MMM Cardio: Bradycardic with regular rhythm. S1S2 heard, no murmurs appreciated Pulm: clear to auscultation bilaterally, no wheezes, rhonchi or rales; normal work of breathing on room air Ext: No edema. Neuro: No focal neurologic deficits.  Follows all commands. Psych: Mood stable, speech normal, affect appropriate  MMSE - Mini Mental State Exam 02/20/2019 04/17/2018 06/08/2016  Orientation to time 2 5 5   Orientation to Place 4 4 5   Registration 3 3 3   Attention/ Calculation 0 5 5  Recall 3 1 2   Language- name 2 objects 2 2 2   Language- repeat 1 1 1   Language- follow 3 step command 3 3 3   Language- read & follow direction 1 1 1   Write a sentence 1 1 1   Copy design 1 1 1   Total score 21 27 29     Assessment/ Plan: 73 y.o. female   1. Essential hypertension Blood pressure slightly elevated today.  We may need to consider resuming Norvasc 5 mg daily again. - Basic Metabolic Panel  2. Persistent atrial fibrillation Sinus rhythm today.  Rate controlled.  3. Chronic anticoagulation No red flags.  Continue anticoagulation  4. Chronic kidney disease (CKD), stage III (moderate) (HCC) Check BMP - Basic Metabolic Panel  5. Chest pain, unspecified type Resolved.  EKG was obtained which did not demonstrate any significant changes from her previous EKG earlier this year.  I have recommended that she consider scheduling a follow-up visit with her cardiologist soon.  They may consider repeat studies.  We discussed that if symptoms were to return that she should seek immediate medical attention in the emergency department.  NSTEMI is amongst the differential diagnosis for this patient. - EKG 12-Lead  6. Cognitive changes Uncertain etiology.  She has had quite a change from July of last year and her MMSE score.  I have added RPR, TSH and B12 to the labs that were already obtained today.  Unfortunately could not obtain any  more than this post lab draw.  I am placing a referral to neurology for further evaluation. - Ambulatory referral to Neurology    Orders Placed This Encounter  Procedures  . Basic Metabolic Panel  . Ambulatory referral to Neurology    Referral Priority:   Routine    Referral Type:   Consultation    Referral Reason:   Specialty Services Required    Requested Specialty:   Neurology    Number of Visits Requested:   1  . EKG 12-Lead   No orders of the defined types were placed in this encounter.    [name], DO [place] [phone]

## 2019-02-20 NOTE — Telephone Encounter (Signed)
Spoke to pt again and explained referral

## 2019-02-20 NOTE — Telephone Encounter (Signed)
Barnett Applebaum called to inform of Neuro referral for memory.

## 2019-02-21 ENCOUNTER — Ambulatory Visit: Payer: Medicare HMO | Admitting: Family Medicine

## 2019-02-21 ENCOUNTER — Other Ambulatory Visit: Payer: Self-pay | Admitting: *Deleted

## 2019-02-21 DIAGNOSIS — E875 Hyperkalemia: Secondary | ICD-10-CM

## 2019-02-21 LAB — BASIC METABOLIC PANEL
BUN/Creatinine Ratio: 15 (ref 12–28)
BUN: 17 mg/dL (ref 8–27)
CO2: 22 mmol/L (ref 20–29)
Calcium: 9.8 mg/dL (ref 8.7–10.3)
Chloride: 96 mmol/L (ref 96–106)
Creatinine, Ser: 1.12 mg/dL — ABNORMAL HIGH (ref 0.57–1.00)
GFR calc Af Amer: 56 mL/min/{1.73_m2} — ABNORMAL LOW (ref >59)
GFR calc non Af Amer: 49 mL/min/{1.73_m2} — ABNORMAL LOW (ref >59)
Glucose: 101 mg/dL — ABNORMAL HIGH (ref 65–99)
Potassium: 5.8 mmol/L — ABNORMAL HIGH (ref 3.5–5.2)
Sodium: 135 mmol/L (ref 134–144)

## 2019-02-21 LAB — VITAMIN B12: Vitamin B-12: 951 pg/mL (ref 232–1245)

## 2019-02-21 LAB — RPR TITER: RPR Titer: NONREACTIVE

## 2019-02-21 LAB — TSH: TSH: 5.24 u[IU]/mL — ABNORMAL HIGH (ref 0.450–4.500)

## 2019-02-28 ENCOUNTER — Other Ambulatory Visit: Payer: Self-pay

## 2019-02-28 ENCOUNTER — Other Ambulatory Visit: Payer: Medicare HMO

## 2019-02-28 DIAGNOSIS — E875 Hyperkalemia: Secondary | ICD-10-CM

## 2019-03-01 LAB — BMP8+EGFR
BUN/Creatinine Ratio: 12 (ref 12–28)
BUN: 15 mg/dL (ref 8–27)
CO2: 24 mmol/L (ref 20–29)
Calcium: 9.9 mg/dL (ref 8.7–10.3)
Chloride: 95 mmol/L — ABNORMAL LOW (ref 96–106)
Creatinine, Ser: 1.21 mg/dL — ABNORMAL HIGH (ref 0.57–1.00)
GFR calc Af Amer: 51 mL/min/{1.73_m2} — ABNORMAL LOW (ref >59)
GFR calc non Af Amer: 44 mL/min/{1.73_m2} — ABNORMAL LOW (ref >59)
Glucose: 100 mg/dL — ABNORMAL HIGH (ref 65–99)
Potassium: 4.9 mmol/L (ref 3.5–5.2)
Sodium: 133 mmol/L — ABNORMAL LOW (ref 134–144)

## 2019-03-09 ENCOUNTER — Other Ambulatory Visit: Payer: Self-pay | Admitting: Cardiovascular Disease

## 2019-04-22 ENCOUNTER — Telehealth: Payer: Self-pay | Admitting: *Deleted

## 2019-04-22 NOTE — Telephone Encounter (Signed)
Called pt & LVM (ok per DPR) advising 04/23/2019 9:00 AM appt canceled d/t provider being out of office unexpectedly. Advised office will call the patient to r/s.

## 2019-04-23 ENCOUNTER — Ambulatory Visit: Payer: Medicare HMO | Admitting: Neurology

## 2019-05-07 ENCOUNTER — Other Ambulatory Visit: Payer: Self-pay | Admitting: Cardiovascular Disease

## 2019-05-08 ENCOUNTER — Other Ambulatory Visit: Payer: Self-pay

## 2019-05-08 ENCOUNTER — Encounter: Payer: Self-pay | Admitting: Family Medicine

## 2019-05-08 ENCOUNTER — Ambulatory Visit: Payer: Medicare HMO | Admitting: Family Medicine

## 2019-05-08 ENCOUNTER — Ambulatory Visit: Payer: Medicare HMO | Admitting: Neurology

## 2019-05-08 VITALS — BP 155/65 | HR 52 | Temp 96.2°F | Ht 62.0 in | Wt 152.4 lb

## 2019-05-08 DIAGNOSIS — R413 Other amnesia: Secondary | ICD-10-CM

## 2019-05-08 NOTE — Progress Notes (Signed)
PATIENT: Kristina Gibbs DOB: Jun 26, 1946  REASON FOR VISIT: follow up HISTORY FROM: patient  Chief Complaint  Patient presents with   New Patient (Initial Visit)    Cognitive change. Alone. Rm 1. Patient mentioned that she is having issues with her memory.      HISTORY OF PRESENT ILLNESS: Today 05/08/19 Kristina Gibbs is a 73 y.o. female here today for follow up of memory impairment. MRI in 2017 was normal for age. MMSE at that time was 27/30. Today MMSE is 20/30. She continues to note concerns of memory impairment. Her husband is with her today and reports concerns as well. He feels that her short term memory has progressively worsened over the past three years. She will repeat stories. She has a hard time remembering names and places that should be familiar to her. She is able to perform all ADL's. She rarely drives. She denies falls or other injuries. Her mother suffered from dementia. She has an extensive cardiac history and is not certain that she wishes to take any new medications at this time. She has follow up regularly with PCP and cardiology.   HISTORY: (copied from Dr Cathren Laine note on 11/17/2015)  HPI:  TATTIANA FAKHOURI is a 73 y.o. female here as a referral from Dr. Hassell Done for memory problems. PMHx CAD s/p MI 1995 with percutaneous revascularization, right coronary stenting 1996, multivessel bypass 2010,  HTN, HLD, previous smoker. Memory problems for 3-4 years, no head trauma or other inciting events but possibly starting around her bypass. She started noticing she was saying things wrong, spelling things wrong, writing worsening. She forgets where she puts things, she forgets peoples names but may remember weeks later or asks her husband, they can be people she just met or people she knows. In the last 6 months it is getting worse, difficulties remembering dates, has to write things down and forgets where she put the notes and she has to search for the paper. Weeks  later she finds the paper and forgets why she wrote it. She can't remember the names of people in church, these are people she has known for a few years and she is so embarrassed because she can't remember their names. Patient pays all the bills, she missed two bills recently to the Dermatologist. She drives but not long distances. She has gotten lost when she drives but she often finds her way eventually.  She is repeating things, not remembering conversations she has with noticing. Husband is noticing and daughter is noticing. No hallucinations or delusions. Having weird dreams. Has good memory for the past. More short-term and recent memory. Mother started having dementia in her 79s and in her 39s started worsening significantly. She forgets appointments. No hallucinations or delusions.  Reviewed notes, labs and imaging from outside physicians, which showed:   11/2015: Hep c neg, CMP with 1.14 creatinine otherwise unremarkable, ldl 81  In 2015 hgba1c 5.5  Reviewed office visits from family medicine, patient complaining of memory loss for a few years, explained the time she gets to the room and forgets she went into the room for. She also forgets events. She's had several Mini-Mental status exam which have been stable from 2070 29/30. She is concerned for her short-term memory being poor. She reports that she been tested several times for dementia. She tends church and Bible studies has occasional word finding difficulty. In October 2016 patient reported memory changes stable for about a year.   REVIEW OF SYSTEMS:  Out of a complete 14 system review of symptoms, the patient complains only of the following symptoms, memory loss, dizziness, speech difficulty, weakness,  and all other reviewed systems are negative.   ALLERGIES: Allergies  Allergen Reactions   Lactose Intolerance (Gi) Nausea And Vomiting and Nausea Only   Codeine Nausea And Vomiting   Hydrocortisone Itching    HOME  MEDICATIONS: Outpatient Medications Prior to Visit  Medication Sig Dispense Refill   amLODipine (NORVASC) 5 MG tablet TAKE 5 MG BY MOUTH EVERY DAY 90 tablet 1   apixaban (ELIQUIS) 5 MG TABS tablet Take 1 tablet (5 mg total) by mouth 2 (two) times daily. 180 tablet 1   aspirin EC 81 MG tablet Take 81 mg by mouth at bedtime.      b complex vitamins tablet Take 1 tablet by mouth daily.      calcium carbonate (OS-CAL) 600 MG TABS Take 600 mg by mouth daily.     cholecalciferol (VITAMIN D) 1000 UNITS tablet Take 1,000 Units by mouth daily.     CO ENZYME Q-10 PO Take 1 capsule by mouth daily.      CRESTOR 20 MG tablet Take 1 tablet (20 mg total) by mouth daily. 90 tablet 3   irbesartan (AVAPRO) 75 MG tablet TAKE 1 TABLET (75 MG TOTAL) BY MOUTH AT BEDTIME. 90 tablet 3   metoprolol succinate (TOPROL-XL) 25 MG 24 hr tablet Take 0.5 tablets (12.5 mg total) by mouth daily. 90 tablet 3   nitroGLYCERIN (NITROLINGUAL) 0.4 MG/SPRAY spray Place 1 spray under the tongue every 5 (five) minutes as needed for chest pain. 12 g 3   omega-3 acid ethyl esters (LOVAZA) 1 g capsule TAKE 1 CAPSULE (1 G TOTAL) BY MOUTH 4 (FOUR) TIMES DAILY. 360 capsule 1   pantoprazole (PROTONIX) 40 MG tablet TAKE 1 TABLET BY MOUTH EVERY DAY 90 tablet 0   PATADAY 0.2 % SOLN Place 1 drop into both eyes 2 (two) times daily.   6   RANEXA 500 MG 12 hr tablet TAKE 1 TABLET BY MOUTH TWICE A DAY 60 tablet 6   vitamin C (ASCORBIC ACID) 500 MG tablet Take 1,000 mg by mouth daily.      AMITIZA 24 MCG capsule TAKE 1 CAPSULE BY MOUTH TWO TIMES DAILY WITH MEALS (Patient not taking: Reported on 05/08/2019) 180 capsule 1   No facility-administered medications prior to visit.     PAST MEDICAL HISTORY: Past Medical History:  Diagnosis Date   Allergy    Arthritis    Cancer ([place])    ovarian   Coronary artery disease    Heart murmur    Hypercholesteremia    Hypertension    Memory impairment     MMSE 27/30   Osteopenia      Stomach problems    Unstable angina ([place]) 05/27/2014    PAST SURGICAL HISTORY: Past Surgical History:  Procedure Laterality Date   ABDOMINAL HYSTERECTOMY     CARDIOVERSION N/A 01/25/2018   Procedure: CARDIOVERSION;  Surgeon: Acie Fredrickson Wonda Cheng, MD;  Location: [facility];  Service: Cardiovascular;  Laterality: N/A;   CHOLECYSTECTOMY     CORONARY ARTERY BYPASS GRAFT     EYE SURGERY     bilateral cataract extraction   HAND SURGERY Left    injurred hand at work- nerve repair   hysterectomy     LEFT HEART CATHETERIZATION WITH CORONARY ANGIOGRAM N/A 05/27/2014   Procedure: LEFT HEART CATHETERIZATION WITH CORONARY ANGIOGRAM;  Surgeon: [name], MD;  Location: [facility]  CATH LAB;  Service: Cardiovascular;  Laterality: N/A;   PCTA     x7     FAMILY HISTORY: Family History  Problem Relation Age of Onset   Stroke Mother    Diabetes Mother    Kidney disease Mother    Hypertension Mother    Hyperlipidemia Mother    Dementia Mother    Heart disease Daughter        heart attack x2   Heart disease Brother        2 heart attacks   Breast cancer Maternal Aunt    Colon cancer Neg Hx    Stomach cancer Neg Hx    Esophageal cancer Neg Hx    Rectal cancer Neg Hx    Liver cancer Neg Hx     SOCIAL HISTORY: Social History   Socioeconomic History   Marital status: Married    Spouse name: [name]    Number of children: 3   Years of education: 12   Highest education level: Not on file  Occupational History   Occupation: Retired  Scientist, product/process development strain: Not on file   Food insecurity    Worry: Not on file    Inability: Not on Lexicographer needs    Medical: Not on file    Non-medical: Not on file  Tobacco Use   Smoking status: Former Smoker    Packs/day: 0.50    Years: 49.00    Pack years: 24.50    Types: Cigarettes    Quit date: 10/04/2009    Years since quitting: 9.5   Smokeless tobacco: Never Used  Substance and  Sexual Activity   Alcohol use: Yes    Alcohol/week: 1.0 standard drinks    Types: 1 Glasses of wine per week    Comment: red wine once per month   Drug use: No   Sexual activity: Not Currently    Birth control/protection: Surgical, Post-menopausal  Lifestyle   Physical activity    Days per week: Not on file    Minutes per session: Not on file   Stress: Not on file  Relationships   Social connections    Talks on phone: Not on file    Gets together: Not on file    Attends religious service: Not on file    Active member of club or organization: Not on file    Attends meetings of clubs or organizations: Not on file    Relationship status: Not on file   Intimate partner violence    Fear of current or ex partner: Not on file    Emotionally abused: Not on file    Physically abused: Not on file    Forced sexual activity: Not on file  Other Topics Concern   Not on file  Social History Narrative   Lives w/ husband   Caffeine use: 3 cups coffee/day     PHYSICAL EXAM  Vitals:   05/08/19 0954  BP: (!) 155/65  Pulse: (!) 52  Temp: (!) 96.2 F (35.7 C)  TempSrc: Oral  Weight: 152 lb 6.4 oz (69.1 kg)  Height: 5' 2"  (1.575 m)   Body mass index is 27.87 kg/m.  Generalized: Well developed, in no acute distress  Cardiology: normal rate and rhythm, no murmur noted Neurological examination  Mentation: Alert oriented to time, place, history taking. Follows all commands speech and language fluent Cranial nerve II-XII: Pupils were equal round reactive to light. Extraocular movements were full, visual field were full  on confrontational test. Facial sensation and strength were normal. Uvula tongue midline. Head turning and shoulder shrug  were normal and symmetric. Motor: The motor testing reveals 5 over 5 strength of all 4 extremities. Good symmetric motor tone is noted throughout.  Coordination: Cerebellar testing reveals good finger-nose-finger and heel-to-shin bilaterally.    Gait and station: Gait is normal.   DIAGNOSTIC DATA (LABS, IMAGING, TESTING) - I reviewed patient records, labs, notes, testing and imaging myself where available.  MMSE - Mini Mental State Exam 05/08/2019 02/20/2019 04/17/2018  Not completed: (No Data) - -  Orientation to time 4 2 5   Orientation to Place 3 4 4   Registration 3 3 3   Attention/ Calculation 1 0 5  Recall 1 3 1   Language- name 2 objects 2 2 2   Language- repeat 1 1 1   Language- follow 3 step command 2 3 3   Language- follow 3 step command-comments She held the paper in the air - -  Language- read & follow direction 1 1 1   Write a sentence 1 1 1   Copy design 1 1 1   Total score 20 21 27      Lab Results  Component Value Date   WBC 5.8 10/23/2018   HGB 12.6 10/23/2018   HCT 38.3 10/23/2018   MCV 90 10/23/2018   PLT 271 10/23/2018      Component Value Date/Time   NA 133 (L) 02/28/2019 0822   K 4.9 02/28/2019 0822   CL 95 (L) 02/28/2019 0822   CO2 24 02/28/2019 0822   GLUCOSE 100 (H) 02/28/2019 0822   GLUCOSE 87 03/05/2017 1616   BUN 15 02/28/2019 0822   CREATININE 1.21 (H) 02/28/2019 0822   CREATININE 1.13 (H) 04/17/2013 0903   CALCIUM 9.9 02/28/2019 0822   PROT 7.4 10/23/2018 0859   ALBUMIN 4.1 10/23/2018 0859   AST 21 10/23/2018 0859   ALT 20 10/23/2018 0859   ALKPHOS 56 10/23/2018 0859   BILITOT 0.7 10/23/2018 0859   GFRNONAA 44 (L) 02/28/2019 0822   GFRNONAA 50 (L) 04/17/2013 0903   GFRAA 51 (L) 02/28/2019 0822   GFRAA 58 (L) 04/17/2013 0903   Lab Results  Component Value Date   CHOL 162 10/23/2018   HDL 86 10/23/2018   LDLCALC 63 10/23/2018   TRIG 64 10/23/2018   CHOLHDL 1.9 10/23/2018   Lab Results  Component Value Date   HGBA1C 5.5 05/26/2014   Lab Results  Component Value Date   VITAMINB12 951 02/20/2019   Lab Results  Component Value Date   TSH 5.240 (H) 02/20/2019    ASSESSMENT AND PLAN 73 y.o. year old female  has a past medical history of Allergy, Arthritis, Cancer ([place]),  Coronary artery disease, Heart murmur, Hypercholesteremia, Hypertension, Memory impairment, Osteopenia, Stomach problems, and Unstable angina ([place]) (05/27/2014). here with     ICD-10-CM   1. Memory impairment  R41.3     She continues to note decline in memory, confirmed by her husband. MMSE score shows decline as well. We have discussed treatment options (Aricept, Namenda) but patient is very hesitant at this time. She does not wish to start any new medications. She would like to discuss these medications with her PCP and cardiologist prior to making a decision. I have encouraged that she consider adding daily exercise to her routine. Well balanced diet also encouraged. We will follow up in 3-6 months, sooner if needed. She and her husband verbalize understanding and agreement with plan.    No orders of the defined  types were placed in this encounter.    No orders of the defined types were placed in this encounter.     I spent 15 minutes with the patient. 50% of this time was spent counseling and educating patient on plan of care and medications.    Debbora Presto, FNP-C 05/08/2019, 2:40 PM [facility] [address], [place], [place] [address] [phone]

## 2019-05-08 NOTE — Patient Instructions (Signed)
Consider medication treatment for dementia including Aricept and Namenda, speak with cardiology per patient request  Follow up in 6 months   Dementia Caregiver Guide Dementia is a term used to describe a number of symptoms that affect memory and thinking. The most common symptoms include:  Memory loss.  Trouble with language and communication.  Trouble concentrating.  Poor judgment.  Problems with reasoning.  Child-like behavior and language.  Extreme anxiety.  Angry outbursts.  Wandering from home or public places. Dementia usually gets worse slowly over time. In the early stages, people with dementia can stay independent and safe with some help. In later stages, they need help with daily tasks such as dressing, grooming, and using the bathroom. How to help the person with dementia cope Dementia can be frightening and confusing. Here are some tips to help the person with dementia cope with changes caused by the disease. General tips  Keep the person on track with his or her routine.  Try to identify areas where the person may need help.  Be supportive, patient, calm, and encouraging.  Gently remind the person that adjusting to changes takes time.  Help with the tasks that the person has asked for help with.  Keep the person involved in daily tasks and decisions as much as possible.  Encourage conversation, but try not to get frustrated or harried if the person struggles to find words or does not seem to appreciate your help. Communication tips  When the person is talking or seems frustrated, make eye contact and hold the person's hand.  Ask specific questions that need yes or no answers.  Use simple words, short sentences, and a calm voice. Only give one direction at a time.  When offering choices, limit them to just 1 or 2.  Avoid correcting the person in a negative way.  If the person is struggling to find the right words, gently try to help him or her.  How to recognize symptoms of stress Symptoms of stress in caregivers include:  Feeling frustrated or angry with the person with dementia.  Denying that the person has dementia or that his or her symptoms will not improve.  Feeling hopeless and unappreciated.  Difficulty sleeping.  Difficulty concentrating.  Feeling anxious, irritable, or depressed.  Developing stress-related health problems.  Feeling like you have too little time for your own life. Follow these instructions at home:   Make sure that you and the person you are caring for: ? Get regular sleep. ? Exercise regularly. ? Eat regular, nutritious meals. ? Drink enough fluid to keep your urine clear or pale yellow. ? Take over-the-counter and prescription medicines only as told by your health care providers. ? Attend all scheduled health care appointments.  Join a support group with others who are caregivers.  Ask about respite care resources so that you can have a regular break from the stress of caregiving.  Look for signs of stress in yourself and in the person you are caring for. If you notice signs of stress, take steps to manage it.  Consider any safety risks and take steps to avoid them.  Organize medications in a pill box for each day of the week.  Create a plan to handle any legal or financial matters. Get legal or financial advice if needed.  Keep a calendar in a central location to remind the person of appointments or other activities. Tips for reducing the risk of injury  Keep floors clear of clutter. Remove rugs, magazine  racks, and floor lamps.  Keep hallways well lit, especially at night.  Put a handrail and nonslip mat in the bathtub or shower.  Put childproof locks on cabinets that contain dangerous items, such as medicines, alcohol, guns, toxic cleaning items, sharp tools or utensils, matches, and lighters.  Put the locks in places where the person cannot see or reach them easily. This  will help ensure that the person does not wander out of the house and get lost.  Be prepared for emergencies. Keep a list of emergency phone numbers and addresses in a convenient area.  Remove car keys and lock garage doors so that the person does not try to get in the car and drive.  Have the person wear a bracelet that tracks locations and identifies the person as having memory problems. This should be worn at all times for safety. Where to find support: Many individuals and organizations offer support. These include:  Support groups for people with dementia and for caregivers.  Counselors or therapists.  Home health care services.  Adult day care centers. Where to find more information Alzheimer's Association: [employer] Contact a health care provider if:  The person's health is rapidly getting worse.  You are no longer able to care for the person.  Caring for the person is affecting your physical and emotional health.  The person threatens himself or herself, you, or anyone else. Summary  Dementia is a term used to describe a number of symptoms that affect memory and thinking.  Dementia usually gets worse slowly over time.  Take steps to reduce the person's risk of injury, and to plan for future care.  Caregivers need support, relief from caregiving, and time for their own lives. This information is not intended to replace advice given to you by your health care provider. Make sure you discuss any questions you have with your health care provider. Document Released: 08/24/2016 Document Revised: 09/02/2017 Document Reviewed: 08/24/2016 Elsevier Patient Education  [place].   Donepezil Oral Dissolving Tablet What is this medicine? DONEPEZIL (doe NEP e zil) is used to treat mild to moderate dementia caused by Alzheimer's disease. This medicine may be used for other purposes; ask your health care provider or pharmacist if you have questions. COMMON BRAND  NAME(S): Aricept What should I tell my health care provider before I take this medicine? They need to know if you have any of these conditions:  asthma or other lung disease  difficulty passing urine  head injury  heart disease  history of irregular heartbeat  liver disease  seizures (convulsions)  stomach or intestinal disease, ulcers or stomach bleeding  an unusual or allergic reaction to donepezil, other medicines, foods, dyes, or preservatives  pregnant or trying to get pregnant  breast-feeding How should I use this medicine? Take this medicine by mouth. Follow the directions on the prescription label. Place the tablet in the mouth and allow it to dissolve, then swallow. While you may take these tablets with water, it is not necessary to do so. You may take this medicine with or without food. Take your doses at regular intervals. This medicine is usually taken before bedtime. Do not take your medicine more often than directed. Continue to take your medicine even if you feel better. Do not stop taking except on the advice of your doctor or health care professional. Talk to your pediatrician regarding the use of this medicine in children. Special care may be needed. Overdosage: If you think you have  taken too much of this medicine contact a poison control center or emergency room at once. NOTE: This medicine is only for you. Do not share this medicine with others. What if I miss a dose? If you miss a dose, take it as soon as you can. If it is almost time for your next dose, take only that dose. Do not take double or extra doses. What may interact with this medicine? Do not take this medicine with any of the following medications:  certain medicines for fungal infections like itraconazole, fluconazole, posaconazole, and voriconazole  cisapride  dextromethorphan; quinidine  dronedarone  pimozide  quinidine  thioridazine This medicine may also interact with the  following medications:  antihistamines for allergy, cough and cold  atropine  bethanechol  carbamazepine  certain medicines for bladder problems like oxybutynin, tolterodine  certain medicines for Parkinson's disease like benztropine, trihexyphenidyl  certain medicines for stomach problems like dicyclomine, hyoscyamine  certain medicines for travel sickness like scopolamine  dexamethasone  dofetilide  ipratropium  NSAIDs, medicines for pain and inflammation, like ibuprofen or naproxen  other medicines for Alzheimer's disease  other medicines that prolong the QT interval (cause an abnormal heart rhythm)  phenobarbital  phenytoin  rifampin, rifabutin or rifapentine  ziprasidone This list may not describe all possible interactions. Give your health care provider a list of all the medicines, herbs, non-prescription drugs, or dietary supplements you use. Also tell them if you smoke, drink alcohol, or use illegal drugs. Some items may interact with your medicine. What should I watch for while using this medicine? Visit your doctor or health care professional for regular checks on your progress. Check with your doctor or health care professional if your symptoms do not get better or if they get worse. You may get drowsy or dizzy. Do not drive, use machinery, or do anything that needs mental alertness until you know how this drug affects you. What side effects may I notice from receiving this medicine? Side effects that you should report to your doctor or health care professional as soon as possible:  allergic reactions like skin rash, itching or hives, swelling of the face, lips, or tongue  feeling faint or lightheaded, falls  loss of bladder control  seizures  signs and symptoms of a dangerous change in heartbeat or heart rhythm like chest pain; dizziness; fast or irregular heartbeat; palpitations; feeling faint or lightheaded, falls; breathing problems  signs and  symptoms of infection like fever or chills; cough; sore throat; pain or trouble passing urine  signs and symptoms of liver injury like dark yellow or brown urine; general ill feeling or flu-like symptoms; light-colored stools; loss of appetite; nausea; right upper belly pain; unusually weak or tired; yellowing of the eyes or skin  slow heartbeat or palpitations  unusual bleeding or bruising  vomiting Side effects that usually do not require medical attention (report to your doctor or health care professional if they continue or are bothersome):  diarrhea, especially when starting treatment  headache  loss of appetite  muscle cramps  nausea  stomach upset This list may not describe all possible side effects. Call your doctor for medical advice about side effects. You may report side effects to FDA at 1-800-FDA-1088. Where should I keep my medicine? Keep out of reach of children. Store at room temperature between 15 and 30 degrees C (59 and 86 degrees F). Throw away any unused medicine after the expiration date. NOTE: This sheet is a summary. It may not cover  all possible information. If you have questions about this medicine, talk to your doctor, pharmacist, or health care provider.  2020 Elsevier/Gold Standard (2018-09-11 10:24:00)   Memantine Tablets What is this medicine? MEMANTINE (MEM an teen) is used to treat dementia caused by Alzheimer's disease. This medicine may be used for other purposes; ask your health care provider or pharmacist if you have questions. COMMON BRAND NAME(S): Namenda What should I tell my health care provider before I take this medicine? They need to know if you have any of these conditions:  difficulty passing urine  kidney disease  liver disease  seizures  an unusual or allergic reaction to memantine, other medicines, foods, dyes, or preservatives  pregnant or trying to get pregnant  breast-feeding How should I use this medicine? Take  this medicine by mouth with a glass of water. Follow the directions on the prescription label. You may take this medicine with or without food. Take your doses at regular intervals. Do not take your medicine more often than directed. Continue to take your medicine even if you feel better. Do not stop taking except on the advice of your doctor or health care professional. Talk to your pediatrician regarding the use of this medicine in children. Special care may be needed. Overdosage: If you think you have taken too much of this medicine contact a poison control center or emergency room at once. NOTE: This medicine is only for you. Do not share this medicine with others. What if I miss a dose? If you miss a dose, take it as soon as you can. If it is almost time for your next dose, take only that dose. Do not take double or extra doses. If you do not take your medicine for several days, contact your health care provider. Your dose may need to be changed. What may interact with this medicine?  acetazolamide  amantadine  cimetidine  dextromethorphan  dofetilide  hydrochlorothiazide  ketamine  metformin  methazolamide  quinidine  ranitidine  sodium bicarbonate  triamterene This list may not describe all possible interactions. Give your health care provider a list of all the medicines, herbs, non-prescription drugs, or dietary supplements you use. Also tell them if you smoke, drink alcohol, or use illegal drugs. Some items may interact with your medicine. What should I watch for while using this medicine? Visit your doctor or health care professional for regular checks on your progress. Check with your doctor or health care professional if there is no improvement in your symptoms or if they get worse. You may get drowsy or dizzy. Do not drive, use machinery, or do anything that needs mental alertness until you know how this drug affects you. Do not stand or sit up quickly, especially if  you are an older patient. This reduces the risk of dizzy or fainting spells. Alcohol can make you more drowsy and dizzy. Avoid alcoholic drinks. What side effects may I notice from receiving this medicine? Side effects that you should report to your doctor or health care professional as soon as possible:  allergic reactions like skin rash, itching or hives, swelling of the face, lips, or tongue  agitation or a feeling of restlessness  depressed mood  dizziness  hallucinations  redness, blistering, peeling or loosening of the skin, including inside the mouth  seizures  vomiting Side effects that usually do not require medical attention (report to your doctor or health care professional if they continue or are bothersome):  constipation  diarrhea  headache  nausea  trouble sleeping This list may not describe all possible side effects. Call your doctor for medical advice about side effects. You may report side effects to FDA at 1-800-FDA-1088. Where should I keep my medicine? Keep out of the reach of children. Store at room temperature between 15 degrees and 30 degrees C (59 degrees and 86 degrees F). Throw away any unused medicine after the expiration date. NOTE: This sheet is a summary. It may not cover all possible information. If you have questions about this medicine, talk to your doctor, pharmacist, or health care provider.  2020 Elsevier/Gold Standard (2013-07-09 14:10:42)

## 2019-05-09 NOTE — Progress Notes (Signed)
Made any corrections needed, and agree with history, physical, neuro exam,assessment and plan as stated.     Antonia Ahern, MD [facility]  

## 2019-05-09 NOTE — Progress Notes (Addendum)
Has she had formal memory testing with Dr. Sima Matas? I always offer that, fyi thanks  Made any corrections needed, and agree with history, physical, neuro exam,assessment and plan as stated.     Sarina Ill, MD [facility]

## 2019-05-15 ENCOUNTER — Other Ambulatory Visit: Payer: Self-pay | Admitting: *Deleted

## 2019-05-15 MED ORDER — APIXABAN 5 MG PO TABS: 5.0000 mg | ORAL_TABLET | Freq: Two times a day (BID) | ORAL | 0 refills | Status: DC

## 2019-05-22 ENCOUNTER — Ambulatory Visit: Payer: Medicare HMO | Admitting: Cardiovascular Disease

## 2019-05-22 ENCOUNTER — Encounter: Payer: Self-pay | Admitting: Cardiovascular Disease

## 2019-05-22 ENCOUNTER — Other Ambulatory Visit: Payer: Self-pay

## 2019-05-22 DIAGNOSIS — I4819 Other persistent atrial fibrillation: Secondary | ICD-10-CM | POA: Diagnosis not present

## 2019-05-22 DIAGNOSIS — I10 Essential (primary) hypertension: Secondary | ICD-10-CM | POA: Diagnosis not present

## 2019-05-22 DIAGNOSIS — Z951 Presence of aortocoronary bypass graft: Secondary | ICD-10-CM | POA: Diagnosis not present

## 2019-05-22 DIAGNOSIS — E785 Hyperlipidemia, unspecified: Secondary | ICD-10-CM | POA: Diagnosis not present

## 2019-05-22 NOTE — Assessment & Plan Note (Signed)
History of paroxysmal atrial fibrillation having undergone DC cardioversion July 2018 maintaining sinus rhythm on Eliquis oral anticoagulation

## 2019-05-22 NOTE — Assessment & Plan Note (Signed)
History of essential hypertension with blood pressure measured today 146/66.  She is on Avapro and metoprolol.

## 2019-05-22 NOTE — Patient Instructions (Signed)

## 2019-05-22 NOTE — Progress Notes (Signed)
05/22/2019 Kristina Gibbs   1946-01-08  [id number]  Primary Physician [name], DO Primary Cardiologist: [name] MD FACP, [place], [place], [place]  HPI:  Kristina Gibbs is a 73 y.o.  mildly overweight Caucasian female with a history of CAD previously taken care of by Dr. Rollene Fare. I last saw her in the office  04/12/2018. She has a history of anterior wall myocardial infarction in 1995 with subsequent percutaneous revascularization to her RCA and LAD. She ultimately requiredmultivessel bypass surgery by Dr. Roxan Hockey March of 2000 and LIMA to LAD, vein to a marginal branch of the circumflex and distal right coronary artery. Problems included hypertension and hyperlipidemia. I performed cardiac catheterization on herin 2010revealing occluded vein grafts, patent LIMA and normal LV function. Since I saw her in the office year ago she's remained medically stable with only a few episodes of nitrateresponsive chest pain. She was recently hospitalized overnight for chest pain 03/05/17. She ruled out for myocardial infarction. She was seen in consultation by Dr. Staci Acosta a Myoview stress test that was performed. This showed no ischemic ischemia. She's had no recurrent chest pain. Since I saw her in the office for months ago she's remained currently stable.  She was in A. fib with RVR when I saw her last and underwent DC cardioversion successfully to sinus rhythm.  She is on Eliquis oral anticoagulation.  Since I saw her in the office a year ago she is remained stable.  She is sheltering in place and socially distancing.  She gets occasional chest pain which is fairly infrequent and lasts only a short period of time, but has not changed in frequency or severity.   Current Meds  Medication Sig  . amLODipine (NORVASC) 5 MG tablet TAKE 5 MG BY MOUTH EVERY DAY  . apixaban (ELIQUIS) 5 MG TABS tablet Take 1 tablet (5 mg total) by mouth 2 (two) times daily.  Marland Kitchen  aspirin EC 81 MG tablet Take 81 mg by mouth at bedtime.   Marland Kitchen b complex vitamins tablet Take 1 tablet by mouth daily.   . calcium carbonate (OS-CAL) 600 MG TABS Take 600 mg by mouth daily.  . cholecalciferol (VITAMIN D) 1000 UNITS tablet Take 1,000 Units by mouth daily.  . CO ENZYME Q-10 PO Take 1 capsule by mouth daily.   . CRESTOR 20 MG tablet Take 1 tablet (20 mg total) by mouth daily.  . irbesartan (AVAPRO) 75 MG tablet TAKE 1 TABLET (75 MG TOTAL) BY MOUTH AT BEDTIME.  . metoprolol succinate (TOPROL-XL) 25 MG 24 hr tablet Take 0.5 tablets (12.5 mg total) by mouth daily.  . nitroGLYCERIN (NITROLINGUAL) 0.4 MG/SPRAY spray Place 1 spray under the tongue every 5 (five) minutes as needed for chest pain.  Marland Kitchen omega-3 acid ethyl esters (LOVAZA) 1 g capsule TAKE 1 CAPSULE (1 G TOTAL) BY MOUTH 4 (FOUR) TIMES DAILY.  . pantoprazole (PROTONIX) 40 MG tablet TAKE 1 TABLET BY MOUTH EVERY DAY  . PATADAY 0.2 % SOLN Place 1 drop into both eyes 2 (two) times daily.   Marland Kitchen RANEXA 500 MG 12 hr tablet TAKE 1 TABLET BY MOUTH TWICE A DAY  . vitamin C (ASCORBIC ACID) 500 MG tablet Take 1,000 mg by mouth daily.      Allergies  Allergen Reactions  . Lactose Intolerance (Gi) Nausea And Vomiting and Nausea Only  . Codeine Nausea And Vomiting  . Hydrocortisone Itching    Social History   Socioeconomic History  . Marital status:  Married    Spouse name: [name]   . Number of children: 3  . Years of education: 61  . Highest education level: Not on file  Occupational History  . Occupation: Retired  Scientific laboratory technician  . Financial resource strain: Not on file  . Food insecurity    Worry: Not on file    Inability: Not on file  . Transportation needs    Medical: Not on file    Non-medical: Not on file  Tobacco Use  . Smoking status: Former Smoker    Packs/day: 0.50    Years: 49.00    Pack years: 24.50    Types: Cigarettes    Quit date: 10/04/2009    Years since quitting: 9.6  . Smokeless tobacco: Never Used   Substance and Sexual Activity  . Alcohol use: Yes    Alcohol/week: 1.0 standard drinks    Types: 1 Glasses of wine per week    Comment: red wine once per month  . Drug use: No  . Sexual activity: Not Currently    Birth control/protection: Surgical, Post-menopausal  Lifestyle  . Physical activity    Days per week: Not on file    Minutes per session: Not on file  . Stress: Not on file  Relationships  . Social Herbalist on phone: Not on file    Gets together: Not on file    Attends religious service: Not on file    Active member of club or organization: Not on file    Attends meetings of clubs or organizations: Not on file    Relationship status: Not on file  . Intimate partner violence    Fear of current or ex partner: Not on file    Emotionally abused: Not on file    Physically abused: Not on file    Forced sexual activity: Not on file  Other Topics Concern  . Not on file  Social History Narrative   Lives w/ husband   Caffeine use: 3 cups coffee/day     Review of Systems: General: negative for chills, fever, night sweats or weight changes.  Cardiovascular: negative for chest pain, dyspnea on exertion, edema, orthopnea, palpitations, paroxysmal nocturnal dyspnea or shortness of breath Dermatological: negative for rash Respiratory: negative for cough or wheezing Urologic: negative for hematuria Abdominal: negative for nausea, vomiting, diarrhea, bright red blood per rectum, melena, or hematemesis Neurologic: negative for visual changes, syncope, or dizziness All other systems reviewed and are otherwise negative except as noted above.    Blood pressure (!) 146/66, pulse 62, height 5\' 2"  (1.575 m), weight 150 lb 3.2 oz (68.1 kg), SpO2 96 %.  General appearance: alert and no distress Neck: no adenopathy, no carotid bruit, no JVD, supple, symmetrical, trachea midline and thyroid not enlarged, symmetric, no tenderness/mass/nodules Lungs: clear to auscultation  bilaterally Heart: regular rate and rhythm, S1, S2 normal, no murmur, click, rub or gallop Extremities: extremities normal, atraumatic, no cyanosis or edema Pulses: 2+ and symmetric Skin: Skin color, texture, turgor normal. No rashes or lesions Neurologic: Alert and oriented X 3, normal strength and tone. Normal symmetric reflexes. Normal coordination and gait  EKG not performed today  ASSESSMENT AND PLAN:   Hx of CABG History of CAD status post CABG in March 2000 by Dr. Roxan Hockey the LIMA to the LAD, vein to a marginal branch of the circumflex and distal RCA.  Her last catheterization performed by myself in 2010 revealed occluded vein grafts with a patent LIMA to  the LAD and normal LV function.  She had a negative Myoview stress test in the setting of an admission for unstable angina June 2018 and has had rare chest pain since.  Dyslipidemia, goal LDL below 70 History of dyslipidemia on Crestor with lipid profile performed 10/23/2018 revealing total cholesterol 162, LDL 63 and HDL of 86.  Essential hypertension History of essential hypertension with blood pressure measured today 146/66.  She is on Avapro and metoprolol.  Persistent atrial fibrillation History of paroxysmal atrial fibrillation having undergone DC cardioversion July 2018 maintaining sinus rhythm on Eliquis oral anticoagulation      [name] MD [facility], [facility] 05/22/2019 9:04 AM

## 2019-05-22 NOTE — Assessment & Plan Note (Signed)
History of dyslipidemia on Crestor with lipid profile performed 10/23/2018 revealing total cholesterol 162, LDL 63 and HDL of 86.

## 2019-05-22 NOTE — Assessment & Plan Note (Signed)
History of CAD status post CABG in March 2000 by Dr. Roxan Hockey the LIMA to the LAD, vein to a marginal branch of the circumflex and distal RCA.  Her last catheterization performed by myself in 2010 revealed occluded vein grafts with a patent LIMA to the LAD and normal LV function.  She had a negative Myoview stress test in the setting of an admission for unstable angina June 2018 and has had rare chest pain since.

## 2019-06-04 ENCOUNTER — Other Ambulatory Visit: Payer: Self-pay | Admitting: Family Medicine

## 2019-06-05 ENCOUNTER — Other Ambulatory Visit: Payer: Self-pay

## 2019-06-06 ENCOUNTER — Other Ambulatory Visit: Payer: Self-pay

## 2019-06-06 ENCOUNTER — Ambulatory Visit (INDEPENDENT_AMBULATORY_CARE_PROVIDER_SITE_OTHER): Payer: Medicare HMO | Admitting: Family Medicine

## 2019-06-06 ENCOUNTER — Encounter: Payer: Self-pay | Admitting: Family Medicine

## 2019-06-06 VITALS — BP 138/52 | HR 63 | Temp 97.3°F | Ht 62.0 in | Wt 153.0 lb

## 2019-06-06 DIAGNOSIS — R413 Other amnesia: Secondary | ICD-10-CM | POA: Diagnosis not present

## 2019-06-06 DIAGNOSIS — I4819 Other persistent atrial fibrillation: Secondary | ICD-10-CM

## 2019-06-06 DIAGNOSIS — I10 Essential (primary) hypertension: Secondary | ICD-10-CM | POA: Diagnosis not present

## 2019-06-06 DIAGNOSIS — Z23 Encounter for immunization: Secondary | ICD-10-CM

## 2019-06-06 DIAGNOSIS — N183 Chronic kidney disease, stage 3 unspecified: Secondary | ICD-10-CM

## 2019-06-06 NOTE — Progress Notes (Signed)
Subjective: CC: HTN, HLD, Afib PCP: [name], DO [id number] L Kristina Gibbs is a 73 y.o. female presenting to clinic today for:  1.  Cognitive impairment Patient was evaluated by neurology on 05/08/2019.  It was recommended that she consider starting Namenda and Aricept.  The patient voiced reluctance.  When I questioned her about why she was nervous about starting the medication, she decided that she wanted more time with Dr. Jaynee Eagles to discuss the medications.  She voiced fear about the medicines potentially interacting with her heart medicines.  She does report that her husband was allowed to come back during the visit.  She continues to frequently forget things and relies on him fairly heavily to keep her on track.  She notes that his memory is fairly good.  She continues to drive but only short distances and states that she has not gotten lost.  She continues to have urinary and fecal continence.  She has not yet established a living will on paper but notes that her family does know what she wants.  She does want to go ahead and get this on paper.  1. HTN, HLD, Afib History: Dr Gwenlyn Found for cardiology. PCI 1995. CABG 2000.  DCCV 2018.   She is compliant w/ Eliquis, Metoprolol XL 25, Norvasc 2.5mg , Avapro 75, Crestor 20 and Ranexa.  She does not report any chest pain, shortness of breath or dizziness.  No falls.  Denies any bleeding including hematochezia, melena or vaginal bleeding.  ROS: Per HPI  Allergies  Allergen Reactions  . Lactose Intolerance (Gi) Nausea And Vomiting and Nausea Only  . Codeine Nausea And Vomiting  . Hydrocortisone Itching   Past Medical History:  Diagnosis Date  . Allergy   . Arthritis   . Cancer ([place])    ovarian  . Coronary artery disease   . Heart murmur   . Hypercholesteremia   . Hypertension   . 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 Osteopenia   . Stomach problems   . Unstable angina ([facility]) 05/27/2014    Current Outpatient Medications:  .   amLODipine (NORVASC) 5 MG tablet, TAKE 1 TABLET BY MOUTH EVERY DAY, Disp: 90 tablet, Rfl: 1 .  apixaban (ELIQUIS) 5 MG TABS tablet, Take 1 tablet (5 mg total) by mouth 2 (two) times daily., Disp: 180 tablet, Rfl: 0 .  aspirin EC 81 MG tablet, Take 81 mg by mouth at bedtime. , Disp: , Rfl:  .  b complex vitamins tablet, Take 1 tablet by mouth daily. , Disp: , Rfl:  .  calcium carbonate (OS-CAL) 600 MG TABS, Take 600 mg by mouth daily., Disp: , Rfl:  .  cholecalciferol (VITAMIN D) 1000 UNITS tablet, Take 1,000 Units by mouth daily., Disp: , Rfl:  .  CO ENZYME Q-10 PO, Take 1 capsule by mouth daily. , Disp: , Rfl:  .  CRESTOR 20 MG tablet, Take 1 tablet (20 mg total) by mouth daily., Disp: 90 tablet, Rfl: 3 .  irbesartan (AVAPRO) 75 MG tablet, TAKE 1 TABLET (75 MG TOTAL) BY MOUTH AT BEDTIME., Disp: 90 tablet, Rfl: 3 .  metoprolol succinate (TOPROL-XL) 25 MG 24 hr tablet, Take 0.5 tablets (12.5 mg total) by mouth daily., Disp: 90 tablet, Rfl: 3 .  nitroGLYCERIN (NITROLINGUAL) 0.4 MG/SPRAY spray, Place 1 spray under the tongue every 5 (five) minutes as needed for chest pain., Disp: 12 g, Rfl: 3 .  omega-3 acid ethyl esters (LOVAZA) 1 g capsule, TAKE 1 CAPSULE (  1 G TOTAL) BY MOUTH 4 (FOUR) TIMES DAILY., Disp: 360 capsule, Rfl: 1 .  pantoprazole (PROTONIX) 40 MG tablet, TAKE 1 TABLET BY MOUTH EVERY DAY, Disp: 90 tablet, Rfl: 0 .  PATADAY 0.2 % SOLN, Place 1 drop into both eyes 2 (two) times daily. , Disp: , Rfl: 6 .  RANEXA 500 MG 12 hr tablet, TAKE 1 TABLET BY MOUTH TWICE A DAY, Disp: 60 tablet, Rfl: 6 .  vitamin C (ASCORBIC ACID) 500 MG tablet, Take 1,000 mg by mouth daily. , Disp: , Rfl:  Social History   Socioeconomic History  . Marital status: Married    Spouse name: [name]   . Number of children: 3  . Years of education: 35  . Highest education level: Not on file  Occupational History  . Occupation: Retired  Scientific laboratory technician  . Financial resource strain: Not on file  . Food insecurity     Worry: Not on file    Inability: Not on file  . Transportation needs    Medical: Not on file    Non-medical: Not on file  Tobacco Use  . Smoking status: Former Smoker    Packs/day: 0.50    Years: 49.00    Pack years: 24.50    Types: Cigarettes    Quit date: 10/04/2009    Years since quitting: 9.6  . Smokeless tobacco: Never Used  Substance and Sexual Activity  . Alcohol use: Yes    Alcohol/week: 1.0 standard drinks    Types: 1 Glasses of wine per week    Comment: red wine once per month  . Drug use: No  . Sexual activity: Not Currently    Birth control/protection: Surgical, Post-menopausal  Lifestyle  . Physical activity    Days per week: Not on file    Minutes per session: Not on file  . Stress: Not on file  Relationships  . Social Herbalist on phone: Not on file    Gets together: Not on file    Attends religious service: Not on file    Active member of club or organization: Not on file    Attends meetings of clubs or organizations: Not on file    Relationship status: Not on file  . Intimate partner violence    Fear of current or ex partner: Not on file    Emotionally abused: Not on file    Physically abused: Not on file    Forced sexual activity: Not on file  Other Topics Concern  . Not on file  Social History Narrative   Lives w/ husband   Caffeine use: 3 cups coffee/day   Family History  Problem Relation Age of Onset  . Stroke Mother   . Diabetes Mother   . Kidney disease Mother   . Hypertension Mother   . Hyperlipidemia Mother   . Dementia Mother   . Heart disease Daughter        heart attack x2  . Heart disease Brother        2 heart attacks  . Breast cancer Maternal Aunt   . Colon cancer Neg Hx   . Stomach cancer Neg Hx   . Esophageal cancer Neg Hx   . Rectal cancer Neg Hx   . Liver cancer Neg Hx     Objective: Office vital signs reviewed. BP (!) 139/40   Pulse 63   Temp (!) 97.3 F (36.3 C) (Temporal)   Ht 5\' 2"  (1.575 m)   Wt  153 lb (69.4 kg)   BMI 27.98 kg/m   Physical Examination:  General: Awake, alert, well nourished, No acute distress HEENT: Normal, sclera white, MMM Cardio: Bradycardic with regular rhythm. S1S2 heard, no murmurs appreciated Pulm: clear to auscultation bilaterally, no wheezes, rhonchi or rales; normal work of breathing on room air Ext: No edema. Neuro: No focal neurologic deficits.  Follows all commands. Psych: Mood stable, speech normal, affect appropriate.  She is pleasant and interactive  Assessment/ Plan: 73 y.o. female   1. Memory impairment I reviewed her recent neurology visit and her recent cardiology visits.  I discussed with her today that I think the memory medications would likely be a good idea.  We discussed that these medications are not intended to reverse her symptoms but rather slow the progression.  I had a frank discussion with her about living will and advanced directives.  She seemed to be very amenable to getting her wishes put down on paper.  A packet was provided to her today.  I would be glad to reach out to her cardiologist, Dr. Gwenlyn Found, for his blessing.  I will contact the patient back once I have heard from the cardiologist and I will also reach out to the neurologist to see if perhaps they can arrange a virtual visit with she and her family.  I am sure that this information has likely been provided previously but it seems like the patient needs reassurance.  2. Chronic kidney disease (CKD), stage III (moderate) ([place]) I will check her kidney function today. - Basic Metabolic Panel  3. Persistent atrial fibrillation She is rate controlled and asymptomatic.  4. Essential hypertension Stable.  Continue current regimen  5. Need for immunization against influenza Flu vaccine was administered during today's visit. - Flu Vaccine QUAD High Dose(Fluad)     No orders of the defined types were placed in this encounter.  No orders of the defined types were placed  in this encounter.    [name], DO [place] [phone]

## 2019-06-06 NOTE — Patient Instructions (Signed)
I agree with the medications recommended to you for your memory.  I will reach out to Dr Gwenlyn Found to get his approval before I call you and your neurologist to start them.  We talked about advanced directives today. I really want you to sit down with your family to talk about the nature of memory diseases (dementia).  They are often progressive (meaning no cure. Medicines SLOW them down)   I would be happy to do a phone conference if needed with you and your family (the neurologist would likely be happy to answer questions as well).  I will call you sometime next week when I have had a chance to speak with Dr Gwenlyn Found and Dr Jaynee Eagles.

## 2019-06-10 ENCOUNTER — Other Ambulatory Visit: Payer: Self-pay | Admitting: Family Medicine

## 2019-06-13 NOTE — Progress Notes (Signed)
Please confirm that Kristina Gibbs is in agreement to start memory medication. We have discussed memory medications as well as potential side effects. If she wishes to review these details, I am happy to see her back. If she is willing we can call in Namenda titrating pack. She will start with 5mg  daily for 1 week, then increase to 5mg  twice daily, then increase to 10mg  in the morning and 5mg  in the evenings for 1 week, then 10mg  twice daily. Most common side effects of nausea, upset stomach and diarrhea.

## 2019-06-14 ENCOUNTER — Telehealth: Payer: Self-pay | Admitting: *Deleted

## 2019-06-14 NOTE — Telephone Encounter (Signed)
I called and LMVM for pt that calling about namenda (medication for memory) is she was willing to start.  Please call back if so.

## 2019-06-14 NOTE — Telephone Encounter (Signed)
-----   Message from Debbora Presto, NP sent at 06/13/2019 10:38 AM EDT -----   ----- Message ----- From: [name], DO Sent: 06/08/2019  11:34 AM EDT To: Debbora Presto, NP, Melvenia Beam, MD  Patient is amenable now to starting the meds you recommended.  Can you please proceed with the rx for these?

## 2019-06-26 NOTE — Telephone Encounter (Signed)
LMVM for pt / husband re: starting memory medication.

## 2019-07-13 DIAGNOSIS — H35361 Drusen (degenerative) of macula, right eye: Secondary | ICD-10-CM | POA: Diagnosis not present

## 2019-07-16 ENCOUNTER — Telehealth: Payer: Self-pay | Admitting: Family Medicine

## 2019-07-16 MED ORDER — NITROGLYCERIN 0.4 MG/SPRAY TL SOLN: 1.0000 | 3 refills | Status: DC | PRN

## 2019-07-16 NOTE — Telephone Encounter (Signed)
What is the name of the medication? nitroGLYCERIN (NITROLINGUAL) 0.4 MG/SPRAY spray  Have you contacted your pharmacy to request a refill? no  Which pharmacy would you like this sent to? [facility]   Patient notified that their request is being sent to the clinical staff for review and that they should receive a call once it is complete. If they do not receive a call within 24 hours they can check with their pharmacy or our office.

## 2019-07-16 NOTE — Telephone Encounter (Signed)
Sent to pharm ....

## 2019-07-17 ENCOUNTER — Ambulatory Visit (INDEPENDENT_AMBULATORY_CARE_PROVIDER_SITE_OTHER): Payer: Medicare HMO | Admitting: Family Medicine

## 2019-07-17 ENCOUNTER — Other Ambulatory Visit: Payer: Self-pay

## 2019-07-17 DIAGNOSIS — M79604 Pain in right leg: Secondary | ICD-10-CM

## 2019-07-17 DIAGNOSIS — M79605 Pain in left leg: Secondary | ICD-10-CM

## 2019-07-17 DIAGNOSIS — R413 Other amnesia: Secondary | ICD-10-CM

## 2019-07-17 DIAGNOSIS — M85851 Other specified disorders of bone density and structure, right thigh: Secondary | ICD-10-CM | POA: Diagnosis not present

## 2019-07-17 NOTE — Addendum Note (Signed)
Addended by: Earlene Plater on: 07/17/2019 09:55 AM   Modules accepted: Orders

## 2019-07-17 NOTE — Progress Notes (Signed)
Telephone visit  Subjective: CC: Leg pain PCP: [name], DO WCH:[id number] L Bloomquist is a 73 y.o. female calls for telephone consult today. Patient provides verbal consent for consult held via phone.  Location of patient: Home Location of provider: Working remotely from home Others present for call: None  1.  Leg pain Patient reports greater than 1 week history of bilateral lower extremity pain.  She notes that she is fine at rest but getting up and walking around causes the legs to ache quite a bit.  She started taking Tylenol with some relief.  She is anticoagulated and cannot tolerate NSAIDs.  She notes that the pain seems to be related to discontinuation of her Fosamax, which she discontinued last year.  She had been on the medication for over 5 years for osteopenia with severe features.  She never switched to Prolia due to concerns about possible side effects but unfortunately her memory impairment caused her to forget to talk about this during each office visit.  She has not yet started the medications for her memory impairment as she has continued to forget to call the neurologist back to start medication.   ROS: Per HPI  Allergies  Allergen Reactions  . Lactose Intolerance (Gi) Nausea And Vomiting and Nausea Only  . Codeine Nausea And Vomiting  . Hydrocortisone Itching   Past Medical History:  Diagnosis Date  . Allergy   . Arthritis   . Cancer ([place])    ovarian  . Coronary artery disease   . Heart murmur   . Hypercholesteremia   . Hypertension   . 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 Osteopenia   . Stomach problems   . Unstable angina ([facility]) 05/27/2014    Current Outpatient Medications:  .  amLODipine (NORVASC) 5 MG tablet, TAKE 1 TABLET BY MOUTH EVERY DAY, Disp: 90 tablet, Rfl: 1 .  apixaban (ELIQUIS) 5 MG TABS tablet, Take 1 tablet (5 mg total) by mouth 2 (two) times daily., Disp: 180 tablet, Rfl: 0 .  aspirin EC 81 MG tablet, Take 81 mg by mouth at bedtime.  , Disp: , Rfl:  .  b complex vitamins tablet, Take 1 tablet by mouth daily. , Disp: , Rfl:  .  calcium carbonate (OS-CAL) 600 MG TABS, Take 600 mg by mouth daily., Disp: , Rfl:  .  cholecalciferol (VITAMIN D) 1000 UNITS tablet, Take 1,000 Units by mouth daily., Disp: , Rfl:  .  CO ENZYME Q-10 PO, Take 1 capsule by mouth daily. , Disp: , Rfl:  .  CRESTOR 20 MG tablet, Take 1 tablet (20 mg total) by mouth daily., Disp: 90 tablet, Rfl: 3 .  irbesartan (AVAPRO) 75 MG tablet, TAKE 1 TABLET (75 MG TOTAL) BY MOUTH AT BEDTIME., Disp: 90 tablet, Rfl: 3 .  metoprolol succinate (TOPROL-XL) 25 MG 24 hr tablet, Take 0.5 tablets (12.5 mg total) by mouth daily., Disp: 90 tablet, Rfl: 3 .  nitroGLYCERIN (NITROLINGUAL) 0.4 MG/SPRAY spray, Place 1 spray under the tongue every 5 (five) minutes as needed for chest pain., Disp: 12 g, Rfl: 3 .  omega-3 acid ethyl esters (LOVAZA) 1 g capsule, TAKE 1 CAPSULE (1 G TOTAL) BY MOUTH 4 (FOUR) TIMES DAILY., Disp: 360 capsule, Rfl: 1 .  pantoprazole (PROTONIX) 40 MG tablet, TAKE 1 TABLET BY MOUTH EVERY DAY, Disp: 90 tablet, Rfl: 0 .  PATADAY 0.2 % SOLN, Place 1 drop into both eyes 2 (two) times daily. , Disp: , Rfl: 6 .  RANEXA 500 MG 12 hr tablet, TAKE 1 TABLET BY MOUTH TWICE A DAY, Disp: 60 tablet, Rfl: 6 .  vitamin C (ASCORBIC ACID) 500 MG tablet, Take 1,000 mg by mouth daily. , Disp: , Rfl:   Assessment/ Plan: 73 y.o. female   1. Osteopenia of neck of right femur Fosamax was discontinued last year in anticipation that she would switch to Prolia, as she was overdue for drug holiday.  This was never started as she was concerned about possible side effects of Prolia.  She would like to get back on Fosamax and she has been off of this medicine for 1 year now.  We will check a vitamin D and calcium level and if appropriate will restart on Fosamax. - VITAMIN D 25 Hydroxy (Vit-D Deficiency, Fractures); Future - CMP14+EGFR; Future  2. Pain in both lower extremities Check  vitamin D levels as above as well as CMP.  Recommended use of Tylenol every 8 hours.  3. Memory impairment She has not yet gotten back with her neurologist.  I again gave her the telephone number today for [facility].  Recommend starting the memory medications given rapid decline over the last year.  She took down the telephone number today and made a note to herself to call.  Start time: 8:13am End time: 8:20am  Total time spent on patient care (including telephone call/ virtual visit): 13 minutes  [place], [place] [phone]

## 2019-07-18 ENCOUNTER — Other Ambulatory Visit: Payer: Self-pay | Admitting: Family Medicine

## 2019-07-18 DIAGNOSIS — M85851 Other specified disorders of bone density and structure, right thigh: Secondary | ICD-10-CM

## 2019-07-18 LAB — CMP14+EGFR
ALT: 19 IU/L (ref 0–32)
AST: 21 IU/L (ref 0–40)
Albumin/Globulin Ratio: 1.7 (ref 1.2–2.2)
Albumin: 4.2 g/dL (ref 3.7–4.7)
Alkaline Phosphatase: 59 IU/L (ref 39–117)
BUN/Creatinine Ratio: 12 (ref 12–28)
BUN: 14 mg/dL (ref 8–27)
Bilirubin Total: 0.7 mg/dL (ref 0.0–1.2)
CO2: 25 mmol/L (ref 20–29)
Calcium: 9.1 mg/dL (ref 8.7–10.3)
Chloride: 98 mmol/L (ref 96–106)
Creatinine, Ser: 1.15 mg/dL — ABNORMAL HIGH (ref 0.57–1.00)
GFR calc Af Amer: 54 mL/min/{1.73_m2} — ABNORMAL LOW (ref >59)
GFR calc non Af Amer: 47 mL/min/{1.73_m2} — ABNORMAL LOW (ref >59)
Globulin, Total: 2.5 g/dL (ref 1.5–4.5)
Glucose: 96 mg/dL (ref 65–99)
Potassium: 4.5 mmol/L (ref 3.5–5.2)
Sodium: 134 mmol/L (ref 134–144)
Total Protein: 6.7 g/dL (ref 6.0–8.5)

## 2019-07-18 LAB — VITAMIN D 25 HYDROXY (VIT D DEFICIENCY, FRACTURES): Vit D, 25-Hydroxy: 76.4 ng/mL (ref 30.0–100.0)

## 2019-07-18 MED ORDER — ALENDRONATE SODIUM 70 MG PO TABS: 70.0000 mg | ORAL_TABLET | ORAL | 11 refills | Status: DC

## 2019-07-31 ENCOUNTER — Other Ambulatory Visit: Payer: Self-pay

## 2019-07-31 ENCOUNTER — Other Ambulatory Visit: Payer: Self-pay | Admitting: Cardiovascular Disease

## 2019-08-01 ENCOUNTER — Ambulatory Visit (INDEPENDENT_AMBULATORY_CARE_PROVIDER_SITE_OTHER): Payer: Medicare HMO | Admitting: Family Medicine

## 2019-08-01 VITALS — BP 150/52 | HR 55 | Temp 97.3°F | Ht 62.0 in | Wt 151.0 lb

## 2019-08-01 DIAGNOSIS — M7061 Trochanteric bursitis, right hip: Secondary | ICD-10-CM | POA: Diagnosis not present

## 2019-08-01 MED ORDER — METHYLPREDNISOLONE ACETATE 40 MG/ML IJ SUSP
40.0000 mg | Freq: Once | INTRAMUSCULAR | Status: AC
  Administered 2019-08-01: 40 mg via INTRA_ARTICULAR

## 2019-08-01 NOTE — Addendum Note (Signed)
Addended byCarrolyn Leigh on: 08/01/2019 09:20 AM   Modules accepted: Orders

## 2019-08-01 NOTE — Progress Notes (Signed)
Subjective: CC:leg pain PCP: [name], DO [id number] Kristina Gibbs is a 73 y.o. female presenting to clinic today for:  1. Leg pain Patient is actually having a right sided hip pain that onset about 3 weeks ago.  She denies any preceding injury but reports a fall about 2 years ago onto the right side.  She notes that pain is worse with getting up and down out of a seat or touching that area.  Denies any discoloration or swelling.  No sensation changes.  No weakness.  She tried taking Tylenol with minimal improvement.  No NSAIDs due to chronic anticoagulation with Eliquis.   ROS: Per HPI  Allergies  Allergen Reactions  . Lactose Intolerance (Gi) Nausea And Vomiting and Nausea Only  . Codeine Nausea And Vomiting  . Hydrocortisone Itching   Past Medical History:  Diagnosis Date  . Allergy   . Arthritis   . Cancer ([place])    ovarian  . Coronary artery disease   . Heart murmur   . Hypercholesteremia   . Hypertension   . 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 Osteopenia   . Stomach problems   . Unstable angina ([place]) 05/27/2014    Current Outpatient Medications:  .  alendronate (FOSAMAX) 70 MG tablet, Take 1 tablet (70 mg total) by mouth every 7 (seven) days. Take with a full glass of water on an empty stomach. Do NOT lie down for 1 hour after taking., Disp: 4 tablet, Rfl: 11 .  amLODipine (NORVASC) 5 MG tablet, TAKE 1 TABLET BY MOUTH EVERY DAY, Disp: 90 tablet, Rfl: 1 .  apixaban (ELIQUIS) 5 MG TABS tablet, Take 1 tablet (5 mg total) by mouth 2 (two) times daily., Disp: 180 tablet, Rfl: 0 .  aspirin EC 81 MG tablet, Take 81 mg by mouth at bedtime. , Disp: , Rfl:  .  b complex vitamins tablet, Take 1 tablet by mouth daily. , Disp: , Rfl:  .  calcium carbonate (OS-CAL) 600 MG TABS, Take 600 mg by mouth daily., Disp: , Rfl:  .  cholecalciferol (VITAMIN D) 1000 UNITS tablet, Take 1,000 Units by mouth daily., Disp: , Rfl:  .  CO ENZYME Q-10 PO, Take 1 capsule by mouth daily. ,  Disp: , Rfl:  .  CRESTOR 20 MG tablet, Take 1 tablet (20 mg total) by mouth daily., Disp: 90 tablet, Rfl: 3 .  irbesartan (AVAPRO) 75 MG tablet, TAKE 1 TABLET (75 MG TOTAL) BY MOUTH AT BEDTIME., Disp: 90 tablet, Rfl: 3 .  metoprolol succinate (TOPROL-XL) 25 MG 24 hr tablet, Take 0.5 tablets (12.5 mg total) by mouth daily., Disp: 90 tablet, Rfl: 3 .  nitroGLYCERIN (NITROLINGUAL) 0.4 MG/SPRAY spray, Place 1 spray under the tongue every 5 (five) minutes as needed for chest pain., Disp: 12 g, Rfl: 3 .  omega-3 acid ethyl esters (LOVAZA) 1 g capsule, TAKE 1 CAPSULE (1 G TOTAL) BY MOUTH 4 (FOUR) TIMES DAILY., Disp: 360 capsule, Rfl: 1 .  pantoprazole (PROTONIX) 40 MG tablet, TAKE 1 TABLET BY MOUTH EVERY DAY, Disp: 90 tablet, Rfl: 1 .  PATADAY 0.2 % SOLN, Place 1 drop into both eyes 2 (two) times daily. , Disp: , Rfl: 6 .  RANEXA 500 MG 12 hr tablet, TAKE 1 TABLET BY MOUTH TWICE A DAY, Disp: 60 tablet, Rfl: 6 .  vitamin C (ASCORBIC ACID) 500 MG tablet, Take 1,000 mg by mouth daily. , Disp: , Rfl:  Social History   Socioeconomic History  .  Marital status: Married    Spouse name: [name]   . Number of children: 3  . Years of education: 41  . Highest education level: Not on file  Occupational History  . Occupation: Retired  Scientific laboratory technician  . Financial resource strain: Not on file  . Food insecurity    Worry: Not on file    Inability: Not on file  . Transportation needs    Medical: Not on file    Non-medical: Not on file  Tobacco Use  . Smoking status: Former Smoker    Packs/day: 0.50    Years: 49.00    Pack years: 24.50    Types: Cigarettes    Quit date: 10/04/2009    Years since quitting: 9.8  . Smokeless tobacco: Never Used  Substance and Sexual Activity  . Alcohol use: Yes    Alcohol/week: 1.0 standard drinks    Types: 1 Glasses of wine per week    Comment: red wine once per month  . Drug use: No  . Sexual activity: Not Currently    Birth control/protection: Surgical, Post-menopausal   Lifestyle  . Physical activity    Days per week: Not on file    Minutes per session: Not on file  . Stress: Not on file  Relationships  . Social Herbalist on phone: Not on file    Gets together: Not on file    Attends religious service: Not on file    Active member of club or organization: Not on file    Attends meetings of clubs or organizations: Not on file    Relationship status: Not on file  . Intimate partner violence    Fear of current or ex partner: Not on file    Emotionally abused: Not on file    Physically abused: Not on file    Forced sexual activity: Not on file  Other Topics Concern  . Not on file  Social History Narrative   Lives w/ husband   Caffeine use: 3 cups coffee/day   Family History  Problem Relation Age of Onset  . Stroke Mother   . Diabetes Mother   . Kidney disease Mother   . Hypertension Mother   . Hyperlipidemia Mother   . Dementia Mother   . Heart disease Daughter        heart attack x2  . Heart disease Brother        2 heart attacks  . Breast cancer Maternal Aunt   . Colon cancer Neg Hx   . Stomach cancer Neg Hx   . Esophageal cancer Neg Hx   . Rectal cancer Neg Hx   . Liver cancer Neg Hx     Objective: Office vital signs reviewed. BP (!) 150/52   Pulse (!) 55   Temp (!) 97.3 F (36.3 C) (Temporal)   Ht 5\' 2"  (1.575 m)   Wt 151 lb (68.5 kg)   BMI 27.62 kg/m   Physical Examination:  General: Awake, alert, well nourished, No acute distress Extremities: warm, well perfused, No edema, cyanosis or clubbing; +2 pulses bilaterally MSK: Mildly antalgic gait and normal station  Right hip: Mild pain with FADIR and FABER.  She has exquisite tenderness palpation over the trochanteric bursa. Skin: dry; intact; no rashes or lesions Neuro: Light touch sensation grossly intact  JOINT INJECTION:  Patient denies allergy to antiseptics (including iodine) and anesthetics.  Patient denies h/o diabetes, frequent steroid use.  She  does use blood thinners/ antiplatelets.  Patient was given informed consent and a signed copy has been placed in the chart. Appropriate time out was taken. Area prepped and draped in usual sterile fashion. Anatomic landmarks were identified and injection site was marked.  Ethyl chloride spray was used to numb the area and 1 cc of methylprednisolone 40 mg/ml plus  3 cc of 1% lidocaine without epinephrine was injected into the right trochanteric bursa. The patient tolerated the procedure well and there were no immediate complications. Estimated blood loss is less than 1cc.  Post procedure instructions were reviewed and handout outlining these instructions were provided to patient.   Assessment/ Plan: 73 y.o. female   1. Trochanteric bursitis of right hip Not a candidate for NSAIDs given anticoagulation.  She was treated with a corticosteroid injection to the greater trochanteric bursa.  She tolerated the procedure without difficulty.  There were no immediate complications.  Home care instructions reviewed and reasons for return discussed.  If ongoing pain or unusual progression of the pain would consider imaging of that right hip given fall 2 years ago.  She voiced good understanding follow-up as needed.   No orders of the defined types were placed in this encounter.  No orders of the defined types were placed in this encounter.    [name], DO [place] [phone]

## 2019-08-01 NOTE — Patient Instructions (Signed)
Hip Bursitis  Hip bursitis is inflammation of a fluid-filled sac (bursa) in the hip joint. The bursa prevents the bones in the hip joint from rubbing against each other. Hip bursitis can cause mild to moderate pain, and symptoms often come and go over time. What are the causes? This condition may be caused by:  Injury to the hip.  Overuse of the muscles that surround the hip joint.  Previous injury or surgery of the hip.  Arthritis or gout.  Diabetes.  Thyroid disease.  Infection. In some cases, the cause may not be known. What are the signs or symptoms? Symptoms of this condition include:  Mild or moderate pain in the hip area. Pain may get worse with movement.  Tenderness and swelling of the hip, especially on the outer side of the hip.  In rare cases, the bursa may become infected. This may cause a fever, as well as warmth and redness in the area. Symptoms may come and go. How is this diagnosed? This condition may be diagnosed based on:  A physical exam.  Your medical history.  X-rays.  Removal of fluid from your inflamed bursa for testing (biopsy). You may be sent to a health care provider who specializes in bone diseases (orthopedist) or a provider who specializes in joint inflammation (rheumatologist). How is this treated? This condition is treated by resting, icing, applying pressure (compression), and raising (elevating) the injured area. This is called RICE treatment. In some cases, this may be enough to make your symptoms go away. Treatment may also include:  Using crutches.  Draining fluid out of the bursa to help relieve swelling.  Injecting medicine that helps to reduce inflammation (cortisone).  Additional medicines if the bursa is infected. Follow these instructions at home: Managing pain, stiffness, and swelling   If directed, put ice on the painful area. ? Put ice in a plastic bag. ? Place a towel between your skin and the bag. ? Leave the ice  on for 20 minutes, 2-3 times a day. ? Raise (elevate) your hip above the level of your heart as much as you can without pain. To do this, try putting a pillow under your hips while you lie down. Activity  Return to your normal activities as told by your health care provider. Ask your health care provider what activities are safe for you.  Rest and protect your hip as much as possible until your pain and swelling get better. General instructions  Take over-the-counter and prescription medicines only as told by your health care provider.  Wear compression wraps only as told by your health care provider.  Do not use your hip to support your body weight until your health care provider says that you can. Use crutches as told by your health care provider.  Gently massage and stretch your injured area as often as is comfortable.  Keep all follow-up visits as told by your health care provider. This is important. How is this prevented?  Exercise regularly, as told by your health care provider.  Warm up and stretch before being active.  Cool down and stretch after being active.  If an activity irritates your hip or causes pain, avoid the activity as much as possible.  Avoid sitting down for long periods at a time. Contact a health care provider if you:  Have a fever.  Develop new symptoms.  Have difficulty walking or doing everyday activities.  Have pain that gets worse or does not get better with medicine.  Develop red skin or a feeling of warmth in your hip area. Get help right away if you:  Cannot move your hip.  Have severe pain. Summary  Hip bursitis is inflammation of a fluid-filled sac (bursa) in the hip joint.  Hip bursitis can cause mild to moderate pain, and symptoms often come and go over time.  This condition is treated with rest, ice, compression, elevation, and medicines. This information is not intended to replace advice given to you by your health care  provider. Make sure you discuss any questions you have with your health care provider. Document Released: 03/12/2002 Document Revised: 05/29/2018 Document Reviewed: 05/29/2018 Elsevier Patient Education  2020 Elsevier Inc.  

## 2019-08-09 ENCOUNTER — Other Ambulatory Visit: Payer: Self-pay | Admitting: Family Medicine

## 2019-09-03 ENCOUNTER — Ambulatory Visit: Payer: Medicare HMO | Admitting: Family Medicine

## 2019-09-11 ENCOUNTER — Other Ambulatory Visit: Payer: Self-pay

## 2019-09-12 ENCOUNTER — Encounter: Payer: Self-pay | Admitting: Family Medicine

## 2019-09-12 ENCOUNTER — Ambulatory Visit (INDEPENDENT_AMBULATORY_CARE_PROVIDER_SITE_OTHER): Payer: Medicare HMO | Admitting: Family Medicine

## 2019-09-12 VITALS — BP 135/63 | HR 62 | Temp 97.3°F | Ht 62.0 in | Wt 150.0 lb

## 2019-09-12 DIAGNOSIS — M25551 Pain in right hip: Secondary | ICD-10-CM | POA: Diagnosis not present

## 2019-09-12 DIAGNOSIS — R413 Other amnesia: Secondary | ICD-10-CM

## 2019-09-12 DIAGNOSIS — M62830 Muscle spasm of back: Secondary | ICD-10-CM

## 2019-09-12 MED ORDER — MEMANTINE HCL 28 X 5 MG & 21 X 10 MG PO TABS: ORAL_TABLET | ORAL | 12 refills | Status: DC

## 2019-09-12 NOTE — Patient Instructions (Signed)
I have sent in [place] starter pack for your memory as recommended by your neurologist.  You should follow up with the neurologist in the next 2 months to see if additional medications are needed.  Most common side effect of this medication is nausea but hopefully slowly increasing through the starter pack with minimize this.  I have referred you to physical therapy next door for your back and hip.  I have also referred you to the orthopedist in [place] for help.

## 2019-09-12 NOTE — Progress Notes (Signed)
Subjective: CC: Follow-up memory PCP: [name], DO [id number] Kristina Gibbs is a 73 y.o. female presenting to clinic today for:  1.  Memory impairment Patient reports ongoing memory problems.  She notes that she got lost in [place] recently while her husband was hospitalized.  She is worried about driving going forward in fact her husband brought her to today's appointment.  She is ready to go ahead and start the Namenda as recommended by her neurologist.  She has not yet scheduled a follow-up visit with them but plans to do so  2.  Right hip pain Patient reports ongoing intermittent right hip pain.  She did not feel that the corticosteroid injection done on the trochanteric bursa last time was very effective.  Sometimes pain is totally absent.  Sometimes it is a catching and severe pain that occurs along the lateral aspect of the right hip migrating down to the right thigh.  She sometimes feels unstable on that side.  Denies any radiation to the groin.  3.  Right back pain Patient reports couple week history of right-sided thoracic back pain that is burning in sensation.  She is been using a loose bras in efforts to relieve.  Denies any associated rash.  No preceding injury.  No upper extremity weakness.  She has been using Aspercreme with good relief.  Her husband also massages this occasionally.   ROS: Per HPI  Allergies  Allergen Reactions  . Lactose Intolerance (Gi) Nausea And Vomiting and Nausea Only  . Codeine Nausea And Vomiting  . Hydrocortisone Itching   Past Medical History:  Diagnosis Date  . Allergy   . Arthritis   . Cancer ([place])    ovarian  . Coronary artery disease   . Heart murmur   . Hypercholesteremia   . Hypertension   . 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 Osteopenia   . Stomach problems   . Unstable angina ([place]) 05/27/2014    Current Outpatient Medications:  .  alendronate (FOSAMAX) 70 MG tablet, Take 1 tablet (70 mg total) by mouth  every 7 (seven) days. Take with a full glass of water on an empty stomach. Do NOT lie down for 1 hour after taking., Disp: 4 tablet, Rfl: 11 .  amLODipine (NORVASC) 5 MG tablet, TAKE 1 TABLET BY MOUTH EVERY DAY, Disp: 90 tablet, Rfl: 1 .  aspirin EC 81 MG tablet, Take 81 mg by mouth at bedtime. , Disp: , Rfl:  .  b complex vitamins tablet, Take 1 tablet by mouth daily. , Disp: , Rfl:  .  calcium carbonate (OS-CAL) 600 MG TABS, Take 600 mg by mouth daily., Disp: , Rfl:  .  cholecalciferol (VITAMIN D) 1000 UNITS tablet, Take 1,000 Units by mouth daily., Disp: , Rfl:  .  CO ENZYME Q-10 PO, Take 1 capsule by mouth daily. , Disp: , Rfl:  .  CRESTOR 20 MG tablet, Take 1 tablet (20 mg total) by mouth daily., Disp: 90 tablet, Rfl: 3 .  ELIQUIS 5 MG TABS tablet, TAKE 1 TABLET BY MOUTH TWICE A DAY, Disp: 180 tablet, Rfl: 0 .  irbesartan (AVAPRO) 75 MG tablet, TAKE 1 TABLET (75 MG TOTAL) BY MOUTH AT BEDTIME., Disp: 90 tablet, Rfl: 3 .  metoprolol succinate (TOPROL-XL) 25 MG 24 hr tablet, Take 0.5 tablets (12.5 mg total) by mouth daily., Disp: 90 tablet, Rfl: 3 .  nitroGLYCERIN (NITROLINGUAL) 0.4 MG/SPRAY spray, Place 1 spray under the tongue every 5 (five) minutes  as needed for chest pain., Disp: 12 g, Rfl: 3 .  omega-3 acid ethyl esters (LOVAZA) 1 g capsule, TAKE 1 CAPSULE (1 G TOTAL) BY MOUTH 4 (FOUR) TIMES DAILY., Disp: 360 capsule, Rfl: 1 .  pantoprazole (PROTONIX) 40 MG tablet, TAKE 1 TABLET BY MOUTH EVERY DAY, Disp: 90 tablet, Rfl: 1 .  PATADAY 0.2 % SOLN, Place 1 drop into both eyes 2 (two) times daily. , Disp: , Rfl: 6 .  RANEXA 500 MG 12 hr tablet, TAKE 1 TABLET BY MOUTH TWICE A DAY, Disp: 60 tablet, Rfl: 6 .  vitamin C (ASCORBIC ACID) 500 MG tablet, Take 1,000 mg by mouth daily. , Disp: , Rfl:  Social History   Socioeconomic History  . Marital status: Married    Spouse name: [name]   . Number of children: 3  . Years of education: 82  . Highest education level: Not on file  Occupational History   . Occupation: Retired  Scientific laboratory technician  . Financial resource strain: Not on file  . Food insecurity    Worry: Not on file    Inability: Not on file  . Transportation needs    Medical: Not on file    Non-medical: Not on file  Tobacco Use  . Smoking status: Former Smoker    Packs/day: 0.50    Years: 49.00    Pack years: 24.50    Types: Cigarettes    Quit date: 10/04/2009    Years since quitting: 9.9  . Smokeless tobacco: Never Used  Substance and Sexual Activity  . Alcohol use: Yes    Alcohol/week: 1.0 standard drinks    Types: 1 Glasses of wine per week    Comment: red wine once per month  . Drug use: No  . Sexual activity: Not Currently    Birth control/protection: Surgical, Post-menopausal  Lifestyle  . Physical activity    Days per week: Not on file    Minutes per session: Not on file  . Stress: Not on file  Relationships  . Social Herbalist on phone: Not on file    Gets together: Not on file    Attends religious service: Not on file    Active member of club or organization: Not on file    Attends meetings of clubs or organizations: Not on file    Relationship status: Not on file  . Intimate partner violence    Fear of current or ex partner: Not on file    Emotionally abused: Not on file    Physically abused: Not on file    Forced sexual activity: Not on file  Other Topics Concern  . Not on file  Social History Narrative   Lives w/ husband   Caffeine use: 3 cups coffee/day   Family History  Problem Relation Age of Onset  . Stroke Mother   . Diabetes Mother   . Kidney disease Mother   . Hypertension Mother   . Hyperlipidemia Mother   . Dementia Mother   . Heart disease Daughter        heart attack x2  . Heart disease Brother        2 heart attacks  . Breast cancer Maternal Aunt   . Colon cancer Neg Hx   . Stomach cancer Neg Hx   . Esophageal cancer Neg Hx   . Rectal cancer Neg Hx   . Liver cancer Neg Hx     Objective: Office vital signs  reviewed. BP 135/63  Pulse 62   Temp (!) 97.3 F (36.3 C) (Temporal)   Ht 5\' 2"  (1.575 m)   Wt 150 lb (68 kg)   SpO2 99%   BMI 27.44 kg/m   Physical Examination:  General: Awake, alert, well nourished, No acute distress HEENT: Normal, sclera white.  Moist mucous membranes Cardio: Slightly bradycardic Pulm: Normal work of breathing on room air Extremities: warm, well perfused, No edema, cyanosis or clubbing; +2 pulses bilaterally MSK: Normal gait.  No point tenderness to palpation to the trochanteric bursa today.  No midline tenderness to palpation to the thoracic spine.  Mild right-sided paraspinal muscle tenderness noted.  No palpable abnormalities. Neuro: Alert and oriented.  Follows commands MMSE - Mini Mental State Exam 09/12/2019 05/08/2019 02/20/2019  Not completed: - (No Data) -  Orientation to time 4 4 2   Orientation to Place 4 3 4   Registration 3 3 3   Attention/ Calculation 3 1 0  Recall 2 1 3   Language- name 2 objects 2 2 2   Language- repeat 0 1 1  Language- follow 3 step command 3 2 3   Language- follow 3 step command-comments - She held the paper in the air -  Language- read & follow direction 1 1 1   Write a sentence 1 1 1   Copy design 1 1 1   Total score 24 20 21      Assessment/ Plan: 73 y.o. female   1. Memory impairment I have prescribed Namenda titration pack as recommended by her neurologist.  I recommended that she schedule a follow-up visit to be seen within the next 2 months.  We discussed the possible side effects of nausea with medicine. - memantine (NAMENDA TITRATION PACK) tablet pack; 5 mg/day for =1 week; 5 mg twice daily for =1 week; 15 mg/day given in 5 mg and 10 mg separated doses for =1 week; then 10 mg twice daily  Dispense: 49 tablet; Refill: 12  2. Right hip pain Uncertain etiology.  She has sustained a fall in the past.  Pain is transient and seems to be refractory to trochanteric bursa injection last visit.  I have placed referral to physical  therapy and to orthopedics for further evaluation and management - Ambulatory referral to Orthopedic Surgery - Ambulatory referral to Physical Therapy  3. Spasm of thoracic back muscle I suspect thoracic muscle spasm.  Okay to continue Biofreeze/Aspercreme and massage.  Referral to PT placed. - Ambulatory referral to Physical Therapy   No orders of the defined types were placed in this encounter.  No orders of the defined types were placed in this encounter.    [name], DO [place] [phone]

## 2019-09-18 ENCOUNTER — Ambulatory Visit: Payer: Medicare HMO | Attending: Family Medicine | Admitting: Physical Therapy

## 2019-09-18 ENCOUNTER — Other Ambulatory Visit: Payer: Self-pay

## 2019-09-18 ENCOUNTER — Encounter: Payer: Self-pay | Admitting: Physical Therapy

## 2019-09-18 DIAGNOSIS — M546 Pain in thoracic spine: Secondary | ICD-10-CM | POA: Diagnosis not present

## 2019-09-18 DIAGNOSIS — M25551 Pain in right hip: Secondary | ICD-10-CM

## 2019-09-18 NOTE — Therapy (Addendum)
[facility] [place], [place], [address] Phone: [phone]   Fax:  [phone]  Physical Therapy Evaluation  Patient Details  Name: Kristina Gibbs MRN: [id number] Date of Birth: [birth year] Referring Provider (PT): Ronnie Doss DO.   Encounter Date: 09/18/2019  PT End of Session - 09/18/19 1338    Visit Number  1    Number of Visits  12    Date for PT Re-Evaluation  10/30/19    Authorization Type  PROGRESS NOTE AT 10TH VISIT.  KX MODIFIER AFTER 15 VISITS.    PT Start Time  1030    PT Stop Time  1112    PT Time Calculation (min)  42 min    Activity Tolerance  Patient tolerated treatment well   Patient enjoyed soft tissue work to mid-back and felt better after Gisela.   Behavior During Therapy  --       Past Medical History:  Diagnosis Date  . Allergy   . Arthritis   . Cancer ([place])    ovarian  . Coronary artery disease   . Heart murmur   . Hypercholesteremia   . Hypertension   . 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 Osteopenia   . Stomach problems   . Unstable angina ([place]) 05/27/2014    Past Surgical History:  Procedure Laterality Date  . ABDOMINAL HYSTERECTOMY    . CARDIOVERSION N/A 01/25/2018   Procedure: CARDIOVERSION;  Surgeon: Acie Fredrickson Wonda Cheng, MD;  Location: [facility];  Service: Cardiovascular;  Laterality: N/A;  . CHOLECYSTECTOMY    . CORONARY ARTERY BYPASS GRAFT    . EYE SURGERY     bilateral cataract extraction  . HAND SURGERY Left    injurred hand at work- Programmer, applications  . hysterectomy    . LEFT HEART CATHETERIZATION WITH CORONARY ANGIOGRAM N/A 05/27/2014   Procedure: LEFT HEART CATHETERIZATION WITH CORONARY ANGIOGRAM;  Surgeon: [name], MD;  Location[facility] CATH LAB;  Service: Cardiovascular;  Laterality: N/A;  . PCTA     x7     There were no vitals filed for this visit.   Subjective Assessment - 09/18/19 1358    Subjective  COVID-19 screen performed prior to patient entering  clinic.  The patient presents to the clinic today with c/o ongoing right hip and left mid-back pain.  The patient reports her mid-back pain can be severe when lying on her back.  Her pain varies depending on activity level.  She had an injection in her hip which she states was not helpful.    Pertinent History  CABG, osteopenia, left hand surgery, OA.    Patient Stated Goals  Get out of pain.    Currently in Pain?  Yes    Pain Score  4     Pain Location  Back   RT hip.   Pain Orientation  Right;Left    Pain Descriptors / Indicators  Aching;Throbbing    Pain Type  Acute pain    Pain Onset  More than a month ago    Pain Frequency  Constant    Aggravating Factors   See above.    Pain Relieving Factors  See above.         [facility] PT Assessment - 09/18/19 0001      Assessment   Medical Diagnosis  Right hip pain, Spasm of thoracic back muscle.    Referring Provider (PT)  Ronnie Doss DO.    Onset Date/Surgical Date  --  Several weeks.     Precautions   Precautions  --   Osteopenia.     Restrictions   Weight Bearing Restrictions  No      Balance Screen   Has the patient fallen in the past 6 months  No    Has the patient had a decrease in activity level because of a fear of falling?   No    Is the patient reluctant to leave their home because of a fear of falling?   No      Home Environment   Living Environment  Private residence      Prior Function   Level of Independence  Independent      Posture/Postural Control   Posture/Postural Control  Postural limitations    Postural Limitations  Rounded Shoulders;Forward head      Deep Tendon Reflexes   DTR Assessment Site  Biceps;Brachioradialis;Triceps;Patella;Achilles    Biceps DTR  2+    Brachioradialis DTR  2+    Triceps DTR  2+    Patella DTR  2+    Achilles DTR  2+      ROM / Strength   AROM / PROM / Strength  AROM;Strength      AROM   Overall AROM Comments  Essentially full right hip range of motion.       Strength   Overall Strength Comments  Right hip abduction= 4/5.      Palpation   Palpation comment  Tender to palpation over left thoracic musculature from T5 to T8.  Tender also over right greater trochanter and posterior to trochanter as well.      Ambulation/Gait   Gait Comments  WNL.                Objective measurements completed on examination: See above findings.      [facility] Adult PT Treatment/Exercise - 09/18/19 0001      Manual Therapy   Manual Therapy  Soft tissue mobilization    Soft tissue mobilization  STW/M x 8 minutes to left mid-back musculature (patient seated).                  PT Long Term Goals - 09/18/19 1416      PT LONG TERM GOAL #1   Title  Independent with a HEP.    Time  6    Period  Weeks    Status  New      PT LONG TERM GOAL #2   Title  Perform ADL's with pain not > 3/10.    Time  6    Period  Weeks    Status  New      PT LONG TERM GOAL #3   Title  Sleep undisturbed 6 hours.    Time  6    Period  Weeks    Status  New             Plan - 09/18/19 1412    Clinical Impression Statement  The patient presents to OPPT with c/o left mid-thoracic muscle pain and right hip pain over and posterior to her right greater trochanter.  She has some weakness into right hip abduction likely due to pain.  Her UE/LE DTR's are intact.  Patient will benefit from skilled physical therapy intervention to address deficits and pain.    Personal Factors and Comorbidities  Comorbidity 1;Comorbidity 2;Comorbidity 3+    Comorbidities  Osteopenia, OA, CABG.    Examination-Activity Limitations  Other  Stability/Clinical Decision Making  Evolving/Moderate complexity    Clinical Decision Making  Low    Rehab Potential  Excellent    PT Frequency  2x / week    PT Duration  6 weeks    PT Treatment/Interventions  ADLs/Self Care Home Management;Cryotherapy;Moist Heat;Ultrasound;Therapeutic activities;Therapeutic exercise;Manual  techniques;Patient/family education;Passive range of motion;Dry needling    PT Next Visit Plan  STW/M to left mid-back and right hip.  Pain-free stationary bike.  Thoracic strengthening and postural exercises.  Right hip abduction strengthening.    Consulted and Agree with Plan of Care  Patient       Patient will benefit from skilled therapeutic intervention in order to improve the following deficits and impairments:  Pain, Postural dysfunction, Decreased activity tolerance, Decreased strength  Visit Diagnosis: Pain in thoracic spine - Plan: PT plan of care cert/re-cert  Pain in right hip - Plan: PT plan of care cert/re-cert     Problem List Patient Active Problem List   Diagnosis Date Noted  . Chest pain 10/09/2018  . Chronic anticoagulation 10/08/2018  . Osteopenia 06/21/2018  . Persistent atrial fibrillation   . CAD S/P percutaneous coronary angioplasty 03/06/2017  . Chronic kidney disease (CKD), stage III (moderate) ([place]) 11/06/2015  . Benign skin lesion of nose 08/04/2015  . Essential hypertension 02/22/2014  . Seasonal allergic rhinitis 01/21/2014  . Breast lump in upper outer quadrant 04/17/2013  . Memory impairment 04/17/2013  . Dyslipidemia, goal LDL below 70 06/05/2012  . Hx of CABG 08/31/2010  . HEMORRHOIDS-INTERNAL 08/31/2010  . GERD 08/31/2010  . Constipation 08/31/2010    Katreena Schupp, [place] MPT 09/18/2019, 2:24 PM  [facility] [address] [place], [place], [address] Phone: [phone]   Fax:  [phone]  Name: TRISTEN BERMEA MRN: [id number] Date of Birth: 02/04/1946

## 2019-09-19 ENCOUNTER — Ambulatory Visit: Payer: Medicare HMO | Admitting: Orthopaedic Surgery

## 2019-09-20 ENCOUNTER — Ambulatory Visit: Payer: Medicare HMO | Admitting: Physical Therapy

## 2019-09-20 ENCOUNTER — Other Ambulatory Visit: Payer: Self-pay

## 2019-09-20 DIAGNOSIS — M25551 Pain in right hip: Secondary | ICD-10-CM | POA: Diagnosis not present

## 2019-09-20 DIAGNOSIS — M546 Pain in thoracic spine: Secondary | ICD-10-CM | POA: Diagnosis not present

## 2019-09-20 NOTE — Therapy (Signed)
[facility] [place], [place], [address] Phone: [phone]   Fax:  [phone]  Physical Therapy Treatment  Patient Details  Name: Kristina Gibbs MRN: [id number] Date of Birth: 21-Feb-1946 Referring Provider (PT): Ronnie Doss DO.   Encounter Date: 09/20/2019  PT End of Session - 09/20/19 1237    Visit Number  2    Number of Visits  12    Date for PT Re-Evaluation  10/30/19    Authorization Type  PROGRESS NOTE AT 10TH VISIT.  KX MODIFIER AFTER 15 VISITS.    PT Start Time  1112    PT Stop Time  1203    PT Time Calculation (min)  51 min    Activity Tolerance  Patient tolerated treatment well    Behavior During Therapy  WFL for tasks assessed/performed       Past Medical History:  Diagnosis Date  . Allergy   . Arthritis   . Cancer ([place])    ovarian  . Coronary artery disease   . Heart murmur   . Hypercholesteremia   . Hypertension   . 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 Osteopenia   . Stomach problems   . Unstable angina ([place]) 05/27/2014    Past Surgical History:  Procedure Laterality Date  . ABDOMINAL HYSTERECTOMY    . CARDIOVERSION N/A 01/25/2018   Procedure: CARDIOVERSION;  Surgeon: Acie Fredrickson Wonda Cheng, MD;  Location: [place];  Service: Cardiovascular;  Laterality: N/A;  . CHOLECYSTECTOMY    . CORONARY ARTERY BYPASS GRAFT    . EYE SURGERY     bilateral cataract extraction  . HAND SURGERY Left    injurred hand at work- Programmer, applications  . hysterectomy    . LEFT HEART CATHETERIZATION WITH CORONARY ANGIOGRAM N/A 05/27/2014   Procedure: LEFT HEART CATHETERIZATION WITH CORONARY ANGIOGRAM;  Surgeon: [name], MD;  Location: [facility] C[facility] LAB;  Service: Cardiovascular;  Laterality: N/A;  . PCTA     x7     There were no vitals filed for this visit.  Subjective Assessment - 09/20/19 1238    Subjective  COVID-19 screen performed prior to patient entering clinic.  Patient states her back feels better.     Pertinent History  CABG, osteopenia, left hand surgery, OA.    Patient Stated Goals  Get out of pain.    Currently in Pain?  Yes    Pain Score  3     Pain Location  Back    Pain Orientation  Right;Left    Pain Descriptors / Indicators  Aching;Dull    Pain Type  Acute pain    Pain Onset  More than a month ago    Multiple Pain Sites  Yes    Pain Score  3    Pain Location  Hip    Pain Orientation  Right    Pain Descriptors / Indicators  Aching    Pain Type  Acute pain    Pain Onset  More than a month ago                       [facility] Adult PT Treatment/Exercise - 09/20/19 0001      Modalities   Modalities  Ultrasound      Ultrasound   Ultrasound Location  Right hip    Ultrasound Parameters  U/S at 1.50 W/CM2 x 12 minutes.      Manual Therapy   Manual Therapy  Soft  tissue mobilization    Soft tissue mobilization  Left sdly position with folded pillow between knees for comfort:  STW/M x 13 minutes to affected right hip and then with patient seated, STW/M x 13 minutes to affected thoracic region.                  PT Long Term Goals - 09/18/19 1416      PT LONG TERM GOAL #1   Title  Independent with a HEP.    Time  6    Period  Weeks    Status  New      PT LONG TERM GOAL #2   Title  Perform ADL's with pain not > 3/10.    Time  6    Period  Weeks    Status  New      PT LONG TERM GOAL #3   Title  Sleep undisturbed 6 hours.    Time  6    Period  Weeks    Status  New            Plan - 09/20/19 1241    Clinical Impression Statement  Patient did great today and felt good after treatment.    Personal Factors and Comorbidities  Comorbidity 1;Comorbidity 2;Comorbidity 3+    Comorbidities  Osteopenia, OA, CABG.    Stability/Clinical Decision Making  Evolving/Moderate complexity    Rehab Potential  Excellent    PT Frequency  2x / week    PT Duration  6 weeks    PT Treatment/Interventions  ADLs/Self Care Home Management;Cryotherapy;Moist  Heat;Ultrasound;Therapeutic activities;Therapeutic exercise;Manual techniques;Patient/family education;Passive range of motion;Dry needling    PT Next Visit Plan  STW/M to left mid-back and right hip.  Pain-free stationary bike.  Thoracic strengthening and postural exercises.  Right hip abduction strengthening.  Ultrasound to right hip.    Consulted and Agree with Plan of Care  Patient       Patient will benefit from skilled therapeutic intervention in order to improve the following deficits and impairments:  Pain, Postural dysfunction, Decreased activity tolerance, Decreased strength  Visit Diagnosis: Pain in thoracic spine  Pain in right hip     Problem List Patient Active Problem List   Diagnosis Date Noted  . Chest pain 10/09/2018  . Chronic anticoagulation 10/08/2018  . Osteopenia 06/21/2018  . Persistent atrial fibrillation   . CAD S/P percutaneous coronary angioplasty 03/06/2017  . Chronic kidney disease (CKD), stage III (moderate) ([place]) 11/06/2015  . Benign skin lesion of nose 08/04/2015  . Essential hypertension 02/22/2014  . Seasonal allergic rhinitis 01/21/2014  . Breast lump in upper outer quadrant 04/17/2013  . Memory impairment 04/17/2013  . Dyslipidemia, goal LDL below 70 06/05/2012  . Hx of CABG 08/31/2010  . HEMORRHOIDS-INTERNAL 08/31/2010  . GERD 08/31/2010  . Constipation 08/31/2010    Kristina Gibbs, [place] MPT 09/20/2019, 12:45 PM  [facility] [address] [place], [place], [address] Phone: [phone]   Fax:  [phone]  Name: Kristina Gibbs MRN: [id number] Date of Birth: 01/02/1946

## 2019-09-21 ENCOUNTER — Other Ambulatory Visit: Payer: Self-pay | Admitting: Cardiovascular Disease

## 2019-09-24 ENCOUNTER — Ambulatory Visit: Payer: Medicare HMO | Admitting: Physical Therapy

## 2019-09-25 ENCOUNTER — Other Ambulatory Visit: Payer: Self-pay | Admitting: Cardiovascular Disease

## 2019-09-26 ENCOUNTER — Ambulatory Visit: Payer: Medicare HMO | Admitting: Physical Therapy

## 2019-09-26 ENCOUNTER — Other Ambulatory Visit: Payer: Self-pay

## 2019-09-26 DIAGNOSIS — M25551 Pain in right hip: Secondary | ICD-10-CM | POA: Diagnosis not present

## 2019-09-26 DIAGNOSIS — M546 Pain in thoracic spine: Secondary | ICD-10-CM | POA: Diagnosis not present

## 2019-09-26 NOTE — Therapy (Signed)
[facility] [place], [place], [address] Phone: [phone]   Fax:  [phone]  Physical Therapy Evaluation  Patient Details  Name: Kristina Gibbs MRN: [id number] Date of Birth: June 21, 1946 Referring Provider (PT): Ronnie Doss DO.   Encounter Date: 09/26/2019  PT End of Session - 09/26/19 1427    Visit Number  3    Number of Visits  12    Date for PT Re-Evaluation  10/30/19    Authorization Type  PROGRESS NOTE AT 10TH VISIT.  KX MODIFIER AFTER 15 VISITS.    PT Start Time  1030    PT Stop Time  1120    PT Time Calculation (min)  50 min    Activity Tolerance  Patient tolerated treatment well    Behavior During Therapy  WFL for tasks assessed/performed       Past Medical History:  Diagnosis Date  . Allergy   . Arthritis   . Cancer ([place])    ovarian  . Coronary artery disease   . Heart murmur   . Hypercholesteremia   . Hypertension   . 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 Osteopenia   . Stomach problems   . Unstable angina ([place]) 05/27/2014    Past Surgical History:  Procedure Laterality Date  . ABDOMINAL HYSTERECTOMY    . CARDIOVERSION N/A 01/25/2018   Procedure: CARDIOVERSION;  Surgeon: Acie Fredrickson Wonda Cheng, MD;  Location: [place];  Service: Cardiovascular;  Laterality: N/A;  . CHOLECYSTECTOMY    . CORONARY ARTERY BYPASS GRAFT    . EYE SURGERY     bilateral cataract extraction  . HAND SURGERY Left    injurred hand at work- Programmer, applications  . hysterectomy    . LEFT HEART CATHETERIZATION WITH CORONARY ANGIOGRAM N/A 05/27/2014   Procedure: LEFT HEART CATHETERIZATION WITH CORONARY ANGIOGRAM;  Surgeon: [name], MD;  Location: [facility] CATH LAB;  Service: Cardiovascular;  Laterality: N/A;  . PCTA     x7     There were no vitals filed for this visit.   Subjective Assessment - 09/26/19 1421    Subjective  COVID-19 screen performed prior to patient entering clinic.  I'm doing much better.    Pertinent History  CABG, osteopenia, left hand surgery, OA.    Patient Stated Goals  Get out of pain.    Currently in Pain?  Yes    Pain Score  3     Pain Location  Back    Pain Orientation  Right;Left    Pain Descriptors / Indicators  Aching;Dull    Pain Type  Acute pain    Pain Onset  More than a month ago    Pain Score  3    Pain Location  Hip    Pain Orientation  Right    Pain Descriptors / Indicators  Aching    Pain Onset  More than a month ago                    Objective measurements completed on examination: See above findings.      [facility] Adult PT Treatment/Exercise - 09/26/19 0001      Exercises   Exercises  Knee/Hip      Knee/Hip Exercises: Aerobic   Nustep  Level 3 x 11 minutes.      Modalities   Modalities  Ultrasound      Ultrasound   Ultrasound Location  Right hip.    Ultrasound Parameters  U/S at 1.50 W/CM2 x 12 minutes.      Manual Therapy   Manual Therapy  Soft tissue mobilization    Soft tissue mobilization  STW/M x 8 minutes to right hip and 8 minutes to affected thoracic musculature x 8 minutes.                  PT Long Term Goals - 09/18/19 1416      PT LONG TERM GOAL #1   Title  Independent with a HEP.    Time  6    Period  Weeks    Status  New      PT LONG TERM GOAL #2   Title  Perform ADL's with pain not > 3/10.    Time  6    Period  Weeks    Status  New      PT LONG TERM GOAL #3   Title  Sleep undisturbed 6 hours.    Time  6    Period  Weeks    Status  New             Plan - 09/26/19 1428    Clinical Impression Statement  Patient did great with Nustep today.  She state sthat the treatments have been very helpful thus far with a consistent decrease in pain.    Personal Factors and Comorbidities  Comorbidity 1;Comorbidity 2;Comorbidity 3+    Comorbidities  Osteopenia, OA, CABG.    Examination-Activity Limitations  Other    Stability/Clinical Decision Making  Evolving/Moderate complexity    Rehab  Potential  Excellent    PT Frequency  2x / week    PT Duration  6 weeks    PT Treatment/Interventions  ADLs/Self Care Home Management;Cryotherapy;Moist Heat;Ultrasound;Therapeutic activities;Therapeutic exercise;Manual techniques;Patient/family education;Passive range of motion;Dry needling    PT Next Visit Plan  STW/M to left mid-back and right hip.  Pain-free stationary bike.  Thoracic strengthening and postural exercises.  Right hip abduction strengthening.  Ultrasound to right hip.    Consulted and Agree with Plan of Care  Patient       Patient will benefit from skilled therapeutic intervention in order to improve the following deficits and impairments:  Pain, Postural dysfunction, Decreased activity tolerance, Decreased strength  Visit Diagnosis: Pain in thoracic spine  Pain in right hip     Problem List Patient Active Problem List   Diagnosis Date Noted  . Chest pain 10/09/2018  . Chronic anticoagulation 10/08/2018  . Osteopenia 06/21/2018  . Persistent atrial fibrillation   . CAD S/P percutaneous coronary angioplasty 03/06/2017  . Chronic kidney disease (CKD), stage III (moderate) ([place]) 11/06/2015  . Benign skin lesion of nose 08/04/2015  . Essential hypertension 02/22/2014  . Seasonal allergic rhinitis 01/21/2014  . Breast lump in upper outer quadrant 04/17/2013  . Memory impairment 04/17/2013  . Dyslipidemia, goal LDL below 70 06/05/2012  . Hx of CABG 08/31/2010  . HEMORRHOIDS-INTERNAL 08/31/2010  . GERD 08/31/2010  . Constipation 08/31/2010    Hamp Moreland, [place] MPT 09/26/2019, 2:31 PM  [facility] [address] [place], [place], [address] Phone: [phone]   Fax:  [phone]  Name: Kristina Gibbs MRN: [id number] Date of Birth: 11-30-1945

## 2019-10-02 ENCOUNTER — Ambulatory Visit: Payer: Medicare HMO | Admitting: Physical Therapy

## 2019-10-02 ENCOUNTER — Encounter: Payer: Self-pay | Admitting: Physical Therapy

## 2019-10-02 ENCOUNTER — Other Ambulatory Visit: Payer: Self-pay

## 2019-10-02 DIAGNOSIS — M546 Pain in thoracic spine: Secondary | ICD-10-CM | POA: Diagnosis not present

## 2019-10-02 DIAGNOSIS — M25551 Pain in right hip: Secondary | ICD-10-CM

## 2019-10-02 NOTE — Therapy (Signed)
[facility] [place], [place], [address] Phone: [phone]   Fax:  [phone]  Physical Therapy Treatment  Patient Details  Name: Kristina Gibbs MRN: [id number] Date of Birth: Apr 16, 1946 Referring Provider (PT): Ronnie Doss DO.   Encounter Date: 10/02/2019  PT End of Session - 10/02/19 1223    Visit Number  4    Number of Visits  12    Date for PT Re-Evaluation  10/30/19    Authorization Type  PROGRESS NOTE AT 10TH VISIT.  KX MODIFIER AFTER 15 VISITS.    PT Start Time  1111    PT Stop Time  1201    PT Time Calculation (min)  50 min    Activity Tolerance  Patient tolerated treatment well    Behavior During Therapy  WFL for tasks assessed/performed       Past Medical History:  Diagnosis Date  . Allergy   . Arthritis   . Cancer ([place])    ovarian  . Coronary artery disease   . Heart murmur   . Hypercholesteremia   . Hypertension   . 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 Osteopenia   . Stomach problems   . Unstable angina ([place]) 05/27/2014    Past Surgical History:  Procedure Laterality Date  . ABDOMINAL HYSTERECTOMY    . CARDIOVERSION N/A 01/25/2018   Procedure: CARDIOVERSION;  Surgeon: Acie Fredrickson Wonda Cheng, MD;  Location: [facility];  Service: Cardiovascular;  Laterality: N/A;  . CHOLECYSTECTOMY    . CORONARY ARTERY BYPASS GRAFT    . EYE SURGERY     bilateral cataract extraction  . HAND SURGERY Left    injurred hand at work- Programmer, applications  . hysterectomy    . LEFT HEART CATHETERIZATION WITH CORONARY ANGIOGRAM N/A 05/27/2014   Procedure: LEFT HEART CATHETERIZATION WITH CORONARY ANGIOGRAM;  Surgeon: [name], MD;  Location: [facility] CATH LAB;  Service: Cardiovascular;  Laterality: N/A;  . PCTA     x7     There were no vitals filed for this visit.  Subjective Assessment - 10/02/19 1216    Subjective  COVID-19 screen performed prior to patient entering clinic.  I'm doing much better.    Pertinent  History  CABG, osteopenia, left hand surgery, OA.    Patient Stated Goals  Get out of pain.    Currently in Pain?  Yes    Pain Score  2     Pain Location  Back    Pain Orientation  Right;Left    Pain Descriptors / Indicators  Aching;Dull    Pain Type  Acute pain    Pain Onset  More than a month ago    Pain Score  2    Pain Location  Hip    Pain Orientation  Right    Pain Descriptors / Indicators  Aching    Pain Type  Acute pain    Pain Onset  More than a month ago                       [facility] Adult PT Treatment/Exercise - 10/02/19 0001      Exercises   Exercises  Shoulder;Knee/Hip      Knee/Hip Exercises: Aerobic   Nustep  Level 3 x 15 minutes.      Knee/Hip Exercises: Sidelying   Other Sidelying Knee/Hip Exercises  SDLY hip abduction (right) 2 sets to fatigue.      Shoulder Exercises: Standing  Other Standing Exercises  Red theraband scap retraction to fatique.      Ultrasound   Ultrasound Location  Right hip.    Ultrasound Parameters  U/S at 1.50 W/CM2 x 9 minutes.      Manual Therapy   Manual Therapy  Soft tissue mobilization    Soft tissue mobilization  STW/M x 6 minutes to right hip and 6 minutes to mid-back.                  PT Long Term Goals - 09/18/19 1416      PT LONG TERM GOAL #1   Title  Independent with a HEP.    Time  6    Period  Weeks    Status  New      PT LONG TERM GOAL #2   Title  Perform ADL's with pain not > 3/10.    Time  6    Period  Weeks    Status  New      PT LONG TERM GOAL #3   Title  Sleep undisturbed 6 hours.    Time  6    Period  Weeks    Status  New            Plan - 10/02/19 1220    Clinical Impression Statement  Patient is responding very well to treatments with lowered mid-back and right hip pain-levels.  She did great with the red theraband exercise for her mid-back and very well with sdly hip abduction.  Red thaeraband was provided for home use.    Personal Factors and Comorbidities   Comorbidity 1;Comorbidity 2;Comorbidity 3+    Comorbidities  Osteopenia, OA, CABG.    Examination-Activity Limitations  Other    Stability/Clinical Decision Making  Evolving/Moderate complexity    Rehab Potential  Excellent    PT Frequency  2x / week    PT Duration  6 weeks    PT Treatment/Interventions  ADLs/Self Care Home Management;Cryotherapy;Moist Heat;Ultrasound;Therapeutic activities;Therapeutic exercise;Manual techniques;Patient/family education;Passive range of motion;Dry needling    PT Next Visit Plan  STW/M to left mid-back and right hip.  Pain-free stationary bike.  Thoracic strengthening and postural exercises.  Right hip abduction strengthening.  Ultrasound to right hip.    Consulted and Agree with Plan of Care  Patient       Patient will benefit from skilled therapeutic intervention in order to improve the following deficits and impairments:  Pain, Postural dysfunction, Decreased activity tolerance, Decreased strength  Visit Diagnosis: Pain in thoracic spine  Pain in right hip     Problem List Patient Active Problem List   Diagnosis Date Noted  . Chest pain 10/09/2018  . Chronic anticoagulation 10/08/2018  . Osteopenia 06/21/2018  . Persistent atrial fibrillation   . CAD S/P percutaneous coronary angioplasty 03/06/2017  . Chronic kidney disease (CKD), stage III (moderate) ([place]) 11/06/2015  . Benign skin lesion of nose 08/04/2015  . Essential hypertension 02/22/2014  . Seasonal allergic rhinitis 01/21/2014  . Breast lump in upper outer quadrant 04/17/2013  . Memory impairment 04/17/2013  . Dyslipidemia, goal LDL below 70 06/05/2012  . Hx of CABG 08/31/2010  . HEMORRHOIDS-INTERNAL 08/31/2010  . GERD 08/31/2010  . Constipation 08/31/2010    Shriya Aker, [place] MPT 10/02/2019, 12:23 PM  [facility] [address] [place], [place], [address] Phone: [phone]   Fax:  [phone]  Name: Kristina Gibbs MRN:  [id number] Date of Birth: 27-Aug-1946

## 2019-10-03 ENCOUNTER — Other Ambulatory Visit: Payer: Self-pay | Admitting: Family Medicine

## 2019-10-03 ENCOUNTER — Telehealth: Payer: Self-pay | Admitting: *Deleted

## 2019-10-03 MED ORDER — MEMANTINE HCL 10 MG PO TABS: 10.0000 mg | ORAL_TABLET | Freq: Two times a day (BID) | ORAL | 1 refills | Status: DC

## 2019-10-03 NOTE — Telephone Encounter (Signed)
Patient aware and verbalizes understanding. 

## 2019-10-03 NOTE — Telephone Encounter (Signed)
Namenda 10mg  sent to pharmacy. Please make sure patient has follow up with neurology on memory

## 2019-10-03 NOTE — Telephone Encounter (Signed)
Fax from [facility] for continuation of Mematine Cannot refill titration pack Please advise

## 2019-10-04 ENCOUNTER — Encounter: Payer: Self-pay | Admitting: Physical Therapy

## 2019-10-04 ENCOUNTER — Other Ambulatory Visit: Payer: Self-pay

## 2019-10-04 ENCOUNTER — Ambulatory Visit: Payer: Medicare HMO | Admitting: Physical Therapy

## 2019-10-04 DIAGNOSIS — M546 Pain in thoracic spine: Secondary | ICD-10-CM

## 2019-10-04 DIAGNOSIS — M25551 Pain in right hip: Secondary | ICD-10-CM | POA: Diagnosis not present

## 2019-10-04 NOTE — Therapy (Signed)
[facility] [place], [place], [address] Phone: [phone]   Fax:  [phone]  Physical Therapy Treatment  Patient Details  Name: Kristina Gibbs MRN: [id number] Date of Birth: [birth year] Referring Provider (PT): Ronnie Doss DO.   Encounter Date: 10/04/2019  PT End of Session - 10/04/19 1246    Visit Number  5    Number of Visits  12    Date for PT Re-Evaluation  10/30/19    Authorization Type  PROGRESS NOTE AT 10TH VISIT.  KX MODIFIER AFTER 15 VISITS.    PT Start Time  0945    PT Stop Time  1030    PT Time Calculation (min)  45 min    Activity Tolerance  Patient tolerated treatment well    Behavior During Therapy  WFL for tasks assessed/performed       Past Medical History:  Diagnosis Date  . Allergy   . Arthritis   . Cancer ([place])    ovarian  . Coronary artery disease   . Heart murmur   . Hypercholesteremia   . Hypertension   . 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 Osteopenia   . Stomach problems   . Unstable angina ([place]) 05/27/2014    Past Surgical History:  Procedure Laterality Date  . ABDOMINAL HYSTERECTOMY    . CARDIOVERSION N/A 01/25/2018   Procedure: CARDIOVERSION;  Surgeon: Acie Fredrickson Wonda Cheng, MD;  Location: [place];  Service: Cardiovascular;  Laterality: N/A;  . CHOLECYSTECTOMY    . CORONARY ARTERY BYPASS GRAFT    . EYE SURGERY     bilateral cataract extraction  . HAND SURGERY Left    injurred hand at work- Programmer, applications  . hysterectomy    . LEFT HEART CATHETERIZATION WITH CORONARY ANGIOGRAM N/A 05/27/2014   Procedure: LEFT HEART CATHETERIZATION WITH CORONARY ANGIOGRAM;  Surgeon: [name], MD;  Location: [facility] CATH LAB;  Service: Cardiovascular;  Laterality: N/A;  . PCTA     x7     There were no vitals filed for this visit.  Subjective Assessment - 10/04/19 1243    Subjective  COVID-19 screen performed prior to patient entering clinic.  Doing better.    Pertinent History   CABG, osteopenia, left hand surgery, OA.    Patient Stated Goals  Get out of pain.    Currently in Pain?  Yes    Pain Score  2     Pain Descriptors / Indicators  Aching;Dull    Pain Onset  More than a month ago    Pain Score  2    Pain Location  Hip    Pain Orientation  Right    Pain Onset  More than a month ago                       [facility] Adult PT Treatment/Exercise - 10/04/19 0001      Exercises   Exercises  Knee/Hip      Knee/Hip Exercises: Aerobic   Nustep  Level 3 x 15 minutes.      Manual Therapy   Manual Therapy  Soft tissue mobilization    Soft tissue mobilization  STW/M x 12 minutes to right hip and 12 minutes to mid-back.                  PT Long Term Goals - 09/18/19 1416      PT LONG TERM GOAL #1   Title  Independent  with a HEP.    Time  6    Period  Weeks    Status  New      PT LONG TERM GOAL #2   Title  Perform ADL's with pain not > 3/10.    Time  6    Period  Weeks    Status  New      PT LONG TERM GOAL #3   Title  Sleep undisturbed 6 hours.    Time  6    Period  Weeks    Status  New            Plan - 10/04/19 1248    Clinical Impression Statement  Patient doing very well.  She repored some right hip soreness but nothing like the pain she was reporting prior to starting PT.  She had no mid-back pain after treatment today.    Personal Factors and Comorbidities  Comorbidity 1;Comorbidity 2;Comorbidity 3+    Comorbidities  Osteopenia, OA, CABG.    Examination-Activity Limitations  Other    Stability/Clinical Decision Making  Evolving/Moderate complexity    Rehab Potential  Excellent    PT Frequency  2x / week    PT Duration  6 weeks    PT Treatment/Interventions  ADLs/Self Care Home Management;Cryotherapy;Moist Heat;Ultrasound;Therapeutic activities;Therapeutic exercise;Manual techniques;Patient/family education;Passive range of motion;Dry needling    PT Next Visit Plan  STW/M to left mid-back and right hip.   Pain-free stationary bike.  Thoracic strengthening and postural exercises.  Right hip abduction strengthening.  Ultrasound to right hip.    Consulted and Agree with Plan of Care  Patient       Patient will benefit from skilled therapeutic intervention in order to improve the following deficits and impairments:  Pain, Postural dysfunction, Decreased activity tolerance, Decreased strength  Visit Diagnosis: Pain in thoracic spine  Pain in right hip     Problem List Patient Active Problem List   Diagnosis Date Noted  . Chest pain 10/09/2018  . Chronic anticoagulation 10/08/2018  . Osteopenia 06/21/2018  . Persistent atrial fibrillation   . CAD S/P percutaneous coronary angioplasty 03/06/2017  . Chronic kidney disease (CKD), stage III (moderate) ([place]) 11/06/2015  . Benign skin lesion of nose 08/04/2015  . Essential hypertension 02/22/2014  . Seasonal allergic rhinitis 01/21/2014  . Breast lump in upper outer quadrant 04/17/2013  . Memory impairment 04/17/2013  . Dyslipidemia, goal LDL below 70 06/05/2012  . Hx of CABG 08/31/2010  . HEMORRHOIDS-INTERNAL 08/31/2010  . GERD 08/31/2010  . Constipation 08/31/2010    Tashunda Vandezande, [place] MPT 10/04/2019, 12:53 PM  [facility] [address] [place], [place], [address] Phone: [phone]   Fax:  [phone]  Name: Kristina Gibbs MRN: [id number] Date of Birth: 1946/02/08

## 2019-10-09 ENCOUNTER — Ambulatory Visit: Payer: Medicare HMO | Attending: Family Medicine | Admitting: *Deleted

## 2019-10-09 ENCOUNTER — Encounter: Payer: Self-pay | Admitting: *Deleted

## 2019-10-09 ENCOUNTER — Telehealth: Payer: Self-pay

## 2019-10-09 ENCOUNTER — Other Ambulatory Visit: Payer: Self-pay

## 2019-10-09 DIAGNOSIS — M546 Pain in thoracic spine: Secondary | ICD-10-CM

## 2019-10-09 DIAGNOSIS — M25551 Pain in right hip: Secondary | ICD-10-CM | POA: Insufficient documentation

## 2019-10-09 NOTE — Telephone Encounter (Signed)
Called pt to ask her about the appt on 10/10/19. Asfter looking though last office note on 05/22/19 - it stated that Dr Gwenlyn Found wanted to see her back in 1 year. Asked pt if there was anything that she needed to see Dr berry about before then. Pt stated that she received a letter in the mail stating she had an appt on 10/10/19. Looked through letters and did not find one. Offered to reschedule. Pt stated that she wanted to keep the appt with Dr Gwenlyn Found.

## 2019-10-09 NOTE — Therapy (Signed)
[facility] [place], [place], [address] Phone: [phone]   Fax:  [phone]  Physical Therapy Treatment  Patient Details  Name: Kristina Gibbs MRN: [id number] Date of Birth: 04/06/1946 Referring Provider (PT): Ronnie Doss DO.   Encounter Date: 10/09/2019  PT End of Session - 10/09/19 1035    Visit Number  6    Number of Visits  12    Date for PT Re-Evaluation  10/30/19    Authorization Type  PROGRESS NOTE AT 10TH VISIT.  KX MODIFIER AFTER 15 VISITS.    PT Start Time  0945    PT Stop Time  1032    PT Time Calculation (min)  47 min       Past Medical History:  Diagnosis Date  . Allergy   . Arthritis   . Cancer ([place])    ovarian  . Coronary artery disease   . Heart murmur   . Hypercholesteremia   . Hypertension   . 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 Osteopenia   . Stomach problems   . Unstable angina ([place]) 05/27/2014    Past Surgical History:  Procedure Laterality Date  . ABDOMINAL HYSTERECTOMY    . CARDIOVERSION N/A 01/25/2018   Procedure: CARDIOVERSION;  Surgeon: Acie Fredrickson Wonda Cheng, MD;  Location: [facility];  Service: Cardiovascular;  Laterality: N/A;  . CHOLECYSTECTOMY    . CORONARY ARTERY BYPASS GRAFT    . EYE SURGERY     bilateral cataract extraction  . HAND SURGERY Left    injurred hand at work- Programmer, applications  . hysterectomy    . LEFT HEART CATHETERIZATION WITH CORONARY ANGIOGRAM N/A 05/27/2014   Procedure: LEFT HEART CATHETERIZATION WITH CORONARY ANGIOGRAM;  Surgeon: [name], MD;  Location: [facility] CATH LAB;  Service: Cardiovascular;  Laterality: N/A;  . PCTA     x7     There were no vitals filed for this visit.  Subjective Assessment - 10/09/19 1002    Subjective  COVID-19 screen performed prior to patient entering clinic.  Doing better. Minimal pain this morning    Pertinent History  CABG, osteopenia, left hand surgery, OA.    Currently in Pain?  Yes    Pain Location  Back     Pain Orientation  Right;Left    Pain Descriptors / Indicators  Sore                       [facility] Adult PT Treatment/Exercise - 10/09/19 0001      Exercises   Exercises  Knee/Hip      Knee/Hip Exercises: Aerobic   Nustep  Level 4 x 15 minutes.      Knee/Hip Exercises: Standing   Hip Abduction  Stengthening;Right;2 sets;10 reps    Rocker Board  3 minutes      Knee/Hip Exercises: Seated   Ball Squeeze  3x10     Clamshell with TheraBand  Red   2x10     Shoulder Exercises: Standing   Extension  Strengthening;Both;10 reps;20 reps   XTS green   Row  Strengthening;Both   XTS 4 x 10 green                 PT Long Term Goals - 09/18/19 1416      PT LONG TERM GOAL #1   Title  Independent with a HEP.    Time  6    Period  Weeks    Status  New      PT LONG TERM GOAL #2   Title  Perform ADL's with pain not > 3/10.    Time  6    Period  Weeks    Status  New      PT LONG TERM GOAL #3   Title  Sleep undisturbed 6 hours.    Time  6    Period  Weeks    Status  New            Plan - 10/09/19 1041    Clinical Impression Statement  Pt arrived today reporting feeling pretty good with minimal pain  in her back and hip. She was guided through core and postural exs with needed cues for technique and posture. She was also able to perform RT hip strengthening exs with mainly fatigue.    Personal Factors and Comorbidities  Comorbidity 1;Comorbidity 2;Comorbidity 3+    Comorbidities  Osteopenia, OA, CABG.    Examination-Activity Limitations  Other    Stability/Clinical Decision Making  Evolving/Moderate complexity    Rehab Potential  Excellent    PT Frequency  2x / week    PT Duration  6 weeks    PT Treatment/Interventions  ADLs/Self Care Home Management;Cryotherapy;Moist Heat;Ultrasound;Therapeutic activities;Therapeutic exercise;Manual techniques;Patient/family education;Passive range of motion;Dry needling    PT Next Visit Plan  STW/M to left  mid-back and right hip.  Pain-free stationary bike.  Thoracic strengthening and postural exercises.  Right hip abduction strengthening.  Ultrasound to right hip.    Consulted and Agree with Plan of Care  Patient       Patient will benefit from skilled therapeutic intervention in order to improve the following deficits and impairments:  Pain, Postural dysfunction, Decreased activity tolerance, Decreased strength  Visit Diagnosis: Pain in thoracic spine  Pain in right hip     Problem List Patient Active Problem List   Diagnosis Date Noted  . Chest pain 10/09/2018  . Chronic anticoagulation 10/08/2018  . Osteopenia 06/21/2018  . Persistent atrial fibrillation   . CAD S/P percutaneous coronary angioplasty 03/06/2017  . Chronic kidney disease (CKD), stage III (moderate) ([place]) 11/06/2015  . Benign skin lesion of nose 08/04/2015  . Essential hypertension 02/22/2014  . Seasonal allergic rhinitis 01/21/2014  . Breast lump in upper outer quadrant 04/17/2013  . Memory impairment 04/17/2013  . Dyslipidemia, goal LDL below 70 06/05/2012  . Hx of CABG 08/31/2010  . HEMORRHOIDS-INTERNAL 08/31/2010  . GERD 08/31/2010  . Constipation 08/31/2010    Keely Drennan,CHRIS, PTA 10/09/2019, 11:32 AM  [facility] [place], [place], [address] Phone: [phone]   Fax:  [phone]  Name: Kristina Gibbs MRN: [id number] Date of Birth: 1946/07/10

## 2019-10-10 ENCOUNTER — Encounter: Payer: Self-pay | Admitting: Cardiovascular Disease

## 2019-10-10 ENCOUNTER — Ambulatory Visit: Payer: Medicare HMO | Admitting: Cardiovascular Disease

## 2019-10-10 DIAGNOSIS — E785 Hyperlipidemia, unspecified: Secondary | ICD-10-CM | POA: Diagnosis not present

## 2019-10-10 DIAGNOSIS — I10 Essential (primary) hypertension: Secondary | ICD-10-CM | POA: Diagnosis not present

## 2019-10-10 DIAGNOSIS — I4819 Other persistent atrial fibrillation: Secondary | ICD-10-CM

## 2019-10-10 NOTE — Progress Notes (Signed)
10/10/2019 Kristina Gibbs   May 28, 1946  [id number]  Primary Physician [name], DO Primary Cardiologist: [name] MD FACP, [place], [place], [place]  HPI:  Kristina Gibbs is a 74 y.o.  mildly overweight Caucasian female with a history of CAD previously taken care of by Dr. Rollene Fare. I last saw her in the office  05/22/2019. She has a history of anterior wall myocardial infarction in 1995 with subsequent percutaneous revascularization to her RCA and LAD. She ultimately requiredmultivessel bypass surgery by Dr. Roxan Hockey March of 2000 and LIMA to LAD, vein to a marginal branch of the circumflex and distal right coronary artery. Problems included hypertension and hyperlipidemia. I performed cardiac catheterization on herin 2010revealing occluded vein grafts, patent LIMA and normal LV function. Since I saw her in the office year ago she's remained medically stable with only a few episodes of nitrateresponsive chest pain. She was recently hospitalized overnight for chest pain 03/05/17. She ruled out for myocardial infarction. She was seen in consultation by Dr. Staci Acosta a Myoview stress test that was performed. This showed no ischemic ischemia. She's had no recurrent chest pain. Since I saw her in the officeformonths ago she's remained currently stable.She was in A. fib with RVR when I saw her last and underwent DC cardioversion successfully to sinus rhythm. She is on Eliquis oral anticoagulation.  Since I saw her in the office a year ago she is remained stable.  She is sheltering in place and socially distancing.  She gets occasional chest pain which is fairly infrequent and lasts only a short period of time, but has not changed in frequency or severity.  She was recently started on Namenda by her PCP.  She feels that this is contributing to her feeling occasionally dizzy.    Current Meds  Medication Sig  . alendronate (FOSAMAX) 70 MG tablet Take  1 tablet (70 mg total) by mouth every 7 (seven) days. Take with a full glass of water on an empty stomach. Do NOT lie down for 1 hour after taking.  Marland Kitchen amLODipine (NORVASC) 5 MG tablet TAKE 1 TABLET BY MOUTH EVERY DAY  . aspirin EC 81 MG tablet Take 81 mg by mouth at bedtime.   Marland Kitchen b complex vitamins tablet Take 1 tablet by mouth daily.   . calcium carbonate (OS-CAL) 600 MG TABS Take 600 mg by mouth daily.  . cholecalciferol (VITAMIN D) 1000 UNITS tablet Take 1,000 Units by mouth daily.  . CO ENZYME Q-10 PO Take 1 capsule by mouth daily.   . CRESTOR 20 MG tablet Take 1 tablet (20 mg total) by mouth daily.  Marland Kitchen ELIQUIS 5 MG TABS tablet TAKE 1 TABLET BY MOUTH TWICE A DAY  . irbesartan (AVAPRO) 75 MG tablet TAKE 1 TABLET (75 MG TOTAL) BY MOUTH AT BEDTIME.  . memantine (NAMENDA) 10 MG tablet Take 1 tablet (10 mg total) by mouth 2 (two) times daily.  . metoprolol succinate (TOPROL-XL) 25 MG 24 hr tablet Take 0.5 tablets (12.5 mg total) by mouth daily.  . nitroGLYCERIN (NITROLINGUAL) 0.4 MG/SPRAY spray Place 1 spray under the tongue every 5 (five) minutes as needed for chest pain.  Marland Kitchen omega-3 acid ethyl esters (LOVAZA) 1 g capsule TAKE 1 CAPSULE (1 G TOTAL) BY MOUTH 4 (FOUR) TIMES DAILY.  . pantoprazole (PROTONIX) 40 MG tablet TAKE 1 TABLET BY MOUTH EVERY DAY  . PATADAY 0.2 % SOLN Place 1 drop into both eyes 2 (two) times daily.   Marland Kitchen RANEXA  500 MG 12 hr tablet TAKE 1 TABLET BY MOUTH TWICE A DAY  . vitamin C (ASCORBIC ACID) 500 MG tablet Take 1,000 mg by mouth daily.      Allergies  Allergen Reactions  . Lactose Intolerance (Gi) Nausea And Vomiting and Nausea Only  . Codeine Nausea And Vomiting  . Hydrocortisone Itching    Social History   Socioeconomic History  . Marital status: Married    Spouse name: [name]   . Number of children: 3  . Years of education: 53  . Highest education level: Not on file  Occupational History  . Occupation: Retired  Tobacco Use  . Smoking status: Former Smoker     Packs/day: 0.50    Years: 49.00    Pack years: 24.50    Types: Cigarettes    Quit date: 10/04/2009    Years since quitting: 10.0  . Smokeless tobacco: Never Used  Substance and Sexual Activity  . Alcohol use: Yes    Alcohol/week: 1.0 standard drinks    Types: 1 Glasses of wine per week    Comment: red wine once per month  . Drug use: No  . Sexual activity: Not Currently    Birth control/protection: Surgical, Post-menopausal  Other Topics Concern  . Not on file  Social History Narrative   Lives w/ husband   Caffeine use: 3 cups coffee/day   Social Determinants of Health   Financial Resource Strain:   . Difficulty of Paying Living Expenses: Not on file  Food Insecurity:   . Worried About Charity fundraiser in the Last Year: Not on file  . Ran Out of Food in the Last Year: Not on file  Transportation Needs:   . Lack of Transportation (Medical): Not on file  . Lack of Transportation (Non-Medical): Not on file  Physical Activity:   . Days of Exercise per Week: Not on file  . Minutes of Exercise per Session: Not on file  Stress:   . Feeling of Stress : Not on file  Social Connections:   . Frequency of Communication with Friends and Family: Not on file  . Frequency of Social Gatherings with Friends and Family: Not on file  . Attends Religious Services: Not on file  . Active Member of Clubs or Organizations: Not on file  . Attends Archivist Meetings: Not on file  . Marital Status: Not on file  Intimate Partner Violence:   . Fear of Current or Ex-Partner: Not on file  . Emotionally Abused: Not on file  . Physically Abused: Not on file  . Sexually Abused: Not on file     Review of Systems: General: negative for chills, fever, night sweats or weight changes.  Cardiovascular: negative for chest pain, dyspnea on exertion, edema, orthopnea, palpitations, paroxysmal nocturnal dyspnea or shortness of breath Dermatological: negative for rash Respiratory: negative for  cough or wheezing Urologic: negative for hematuria Abdominal: negative for nausea, vomiting, diarrhea, bright red blood per rectum, melena, or hematemesis Neurologic: negative for visual changes, syncope, or dizziness All other systems reviewed and are otherwise negative except as noted above.    Blood pressure (!) 164/65, pulse 64, temperature (!) 96.8 F (36 C), height 5\' 2"  (1.575 m), weight 150 lb 9.6 oz (68.3 kg), SpO2 97 %.  General appearance: alert and no distress Neck: no adenopathy, no carotid bruit, no JVD, supple, symmetrical, trachea midline and thyroid not enlarged, symmetric, no tenderness/mass/nodules Lungs: clear to auscultation bilaterally Heart: regular rate and rhythm, S1,  S2 normal, no murmur, click, rub or gallop Extremities: extremities normal, atraumatic, no cyanosis or edema Pulses: 2+ and symmetric Skin: Skin color, texture, turgor normal. No rashes or lesions Neurologic: Alert and oriented X 3, normal strength and tone. Normal symmetric reflexes. Normal coordination and gait  EKG sinus rhythm at 64 with rightward axis and septal Q waves.  I personally reviewed this EKG.  ASSESSMENT AND PLAN:   Persistent atrial fibrillation History of PAF status post DC cardioversion July 2018 maintaining sinus rhythm on Eliquis oral anticoagulation.  Essential hypertension History of essential hypertension blood pressure measured at 164/65 which fell to 154/42 by the end of the office visit.  She is on amlodipine, and Avapro along with metoprolol.  Dyslipidemia, goal LDL below 70 History of dyslipidemia on statin therapy with lipid profile performed 10/23/2018 revealing total cholesterol 62, LDL 63 and HDL of 86.      [name] MD FACP,FACC,FAHA, [facility] 10/10/2019 11:16 AM

## 2019-10-10 NOTE — Assessment & Plan Note (Signed)
History of PAF status post DC cardioversion July 2018 maintaining sinus rhythm on Eliquis oral anticoagulation.

## 2019-10-10 NOTE — Assessment & Plan Note (Signed)
History of dyslipidemia on statin therapy with lipid profile performed 10/23/2018 revealing total cholesterol 62, LDL 63 and HDL of 86.

## 2019-10-10 NOTE — Assessment & Plan Note (Signed)
History of essential hypertension blood pressure measured at 164/65 which fell to 154/42 by the end of the office visit.  She is on amlodipine, and Avapro along with metoprolol.

## 2019-10-10 NOTE — Patient Instructions (Signed)
Medication Instructions:  Your physician recommends that you continue on your current medications as directed. Please refer to the Current Medication list given to you today.  If you need a refill on your cardiac medications before your next appointment, please call your pharmacy.   Lab work: NONE  Testing/Procedures: NONE  Follow-Up: At [facility], you and your health needs are our priority.  As part of our continuing mission to provide you with exceptional heart care, we have created designated Provider Care Teams.  These Care Teams include your primary Cardiologist (physician) and Advanced Practice Providers (APPs -  Physician Assistants and Nurse Practitioners) who all work together to provide you with the care you need, when you need it. You may see Dr. Berry or one of the following Advanced Practice Providers on your designated Care Team:    Luke Kilroy, PA-C  Callie Goodrich, PA-C  Jesse Cleaver, FNP  Your physician wants you to follow-up in: 1 year with Dr. Berry       

## 2019-10-11 ENCOUNTER — Other Ambulatory Visit: Payer: Self-pay

## 2019-10-11 ENCOUNTER — Ambulatory Visit: Payer: Medicare HMO | Admitting: *Deleted

## 2019-10-11 DIAGNOSIS — M25551 Pain in right hip: Secondary | ICD-10-CM

## 2019-10-11 DIAGNOSIS — M546 Pain in thoracic spine: Secondary | ICD-10-CM

## 2019-10-11 NOTE — Therapy (Signed)
[facility] [place], [place], [address] Phone: [phone]   Fax:  [phone]  Physical Therapy Treatment  Patient Details  Name: Kristina Gibbs MRN: [id number] Date of Birth: [birth year] Referring Provider (PT): Ronnie Doss DO.   Encounter Date: 10/11/2019  PT End of Session - 10/11/19 1001    Visit Number  7    Number of Visits  12    Date for PT Re-Evaluation  10/30/19    Authorization Type  PROGRESS NOTE AT 10TH VISIT.  KX MODIFIER AFTER 15 VISITS.    PT Start Time  0945    PT Stop Time  1033    PT Time Calculation (min)  48 min       Past Medical History:  Diagnosis Date  . Allergy   . Arthritis   . Cancer ([place])    ovarian  . Coronary artery disease   . Heart murmur   . Hypercholesteremia   . Hypertension   . 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 Osteopenia   . Stomach problems   . Unstable angina ([place]) 05/27/2014    Past Surgical History:  Procedure Laterality Date  . ABDOMINAL HYSTERECTOMY    . CARDIOVERSION N/A 01/25/2018   Procedure: CARDIOVERSION;  Surgeon: Acie Fredrickson Wonda Cheng, MD;  Location: [facility];  Service: Cardiovascular;  Laterality: N/A;  . CHOLECYSTECTOMY    . CORONARY ARTERY BYPASS GRAFT    . EYE SURGERY     bilateral cataract extraction  . HAND SURGERY Left    injurred hand at work- Programmer, applications  . hysterectomy    . LEFT HEART CATHETERIZATION WITH CORONARY ANGIOGRAM N/A 05/27/2014   Procedure: LEFT HEART CATHETERIZATION WITH CORONARY ANGIOGRAM;  Surgeon: [name], MD;  Location: [facility] CATH LAB;  Service: Cardiovascular;  Laterality: N/A;  . PCTA     x7     There were no vitals filed for this visit.  Subjective Assessment - 10/11/19 1001    Subjective  COVID-19 screen performed prior to patient entering clinic.  Doing better. Minimal pain this morning. Put on hold for 2 weeks    Pertinent History  CABG, osteopenia, left hand surgery, OA.    Patient Stated Goals  Get  out of pain.    Currently in Pain?  No/denies    Pain Location  Back    Pain Orientation  Right;Left    Pain Descriptors / Indicators  Sore    Pain Onset  More than a month ago                       [facility] Adult PT Treatment/Exercise - 10/11/19 0001      Exercises   Exercises  Knee/Hip      Knee/Hip Exercises: Aerobic   Nustep  Level 4 x 15 minutes.      Knee/Hip Exercises: Standing   Hip Abduction  Stengthening;Right;2 sets;10 reps    Rocker Board  5 minutes   calf stretching and balance     Knee/Hip Exercises: Seated   Ball Squeeze  3x10     Clamshell with TheraBand  Red   2x10     Shoulder Exercises: Standing   Extension  Strengthening;Both;10 reps;20 reps   XTS green   Row  Strengthening;Both   XTS 4 x 10 green      HEP sheet given and reviewed             PT Long Term  Goals - 10/11/19 1003      PT LONG TERM GOAL #1   Title  Independent with a HEP.    Time  6    Period  Weeks    Status  Achieved      PT LONG TERM GOAL #2   Title  Perform ADL's with pain not > 3/10.    Time  6    Period  Weeks    Status  Achieved      PT LONG TERM GOAL #3   Title  Sleep undisturbed 6 hours.    Time  6    Period  Weeks    Status  Achieved            Plan - 10/11/19 1041    Clinical Impression Statement  Pt arrived today doing fairly well and reports she would like to be put on hold x 2 weeks after today. She was guided through therex for her LB and RT hip and Did well. She was given a copy of therex for HEP and is independent. All LTGs met at this time.    Personal Factors and Comorbidities  Comorbidity 1;Comorbidity 2;Comorbidity 3+    Comorbidities  Osteopenia, OA, CABG.    Stability/Clinical Decision Making  Evolving/Moderate complexity    Rehab Potential  Excellent    PT Frequency  2x / week    PT Duration  6 weeks    PT Treatment/Interventions  ADLs/Self Care Home Management;Cryotherapy;Moist Heat;Ultrasound;Therapeutic  activities;Therapeutic exercise;Manual techniques;Patient/family education;Passive range of motion;Dry needling    PT Next Visit Plan  On hold x 2 weeks.       Patient will benefit from skilled therapeutic intervention in order to improve the following deficits and impairments:  Pain, Postural dysfunction, Decreased activity tolerance, Decreased strength  Visit Diagnosis: Pain in thoracic spine  Pain in right hip     Problem List Patient Active Problem List   Diagnosis Date Noted  . Chest pain 10/09/2018  . Chronic anticoagulation 10/08/2018  . Osteopenia 06/21/2018  . Persistent atrial fibrillation   . CAD S/P percutaneous coronary angioplasty 03/06/2017  . Chronic kidney disease (CKD), stage III (moderate) ([place]) 11/06/2015  . Benign skin lesion of nose 08/04/2015  . Essential hypertension 02/22/2014  . Seasonal allergic rhinitis 01/21/2014  . Breast lump in upper outer quadrant 04/17/2013  . Memory impairment 04/17/2013  . Dyslipidemia, goal LDL below 70 06/05/2012  . Hx of CABG 08/31/2010  . HEMORRHOIDS-INTERNAL 08/31/2010  . GERD 08/31/2010  . Constipation 08/31/2010    Renie Stelmach,CHRIS, PTA 10/11/2019, 10:44 AM  [facility] [place], [place], [address] Phone: [phone]   Fax:  [phone]  Name: Kristina Gibbs MRN: [id number] Date of Birth: 11-Oct-1945

## 2019-10-15 ENCOUNTER — Encounter: Payer: Self-pay | Admitting: Nurse Practitioner

## 2019-10-15 ENCOUNTER — Ambulatory Visit (INDEPENDENT_AMBULATORY_CARE_PROVIDER_SITE_OTHER): Payer: Medicare HMO | Admitting: Nurse Practitioner

## 2019-10-15 ENCOUNTER — Other Ambulatory Visit: Payer: Self-pay

## 2019-10-15 VITALS — BP 141/50 | HR 60 | Temp 96.8°F | Resp 20 | Ht 62.0 in | Wt 150.0 lb

## 2019-10-15 DIAGNOSIS — R300 Dysuria: Secondary | ICD-10-CM

## 2019-10-15 DIAGNOSIS — N3000 Acute cystitis without hematuria: Secondary | ICD-10-CM | POA: Diagnosis not present

## 2019-10-15 LAB — URINALYSIS, COMPLETE
Bilirubin, UA: NEGATIVE
Glucose, UA: NEGATIVE
Ketones, UA: NEGATIVE
Nitrite, UA: NEGATIVE
Protein,UA: NEGATIVE
RBC, UA: NEGATIVE
Specific Gravity, UA: 1.01 (ref 1.005–1.030)
Urobilinogen, Ur: 0.2 mg/dL (ref 0.2–1.0)
pH, UA: 6 (ref 5.0–7.5)

## 2019-10-15 LAB — MICROSCOPIC EXAMINATION
Bacteria, UA: NONE SEEN
RBC, Urine: NONE SEEN /hpf (ref 0–2)
Renal Epithel, UA: NONE SEEN /hpf

## 2019-10-15 MED ORDER — CEPHALEXIN 500 MG PO CAPS: 500.0000 mg | ORAL_CAPSULE | Freq: Two times a day (BID) | ORAL | 0 refills | Status: DC

## 2019-10-15 NOTE — Progress Notes (Signed)
   Subjective:    Patient ID: Kristina Gibbs, female    DOB: May 31, 1946, 74 y.o.   MRN: [id number]   Chief Complaint: Urinary Retention   HPI Patient comes in c/o dysuria for the last 2 days. She is having urgency and frequency. Sh eha dto get up a couple of times last night to urinate. When she does void she only has scant amounts.   Review of Systems  Constitutional: Negative.  Negative for chills and fever.  Cardiovascular: Negative.   Gastrointestinal: Negative.   Genitourinary: Positive for dysuria, frequency and urgency.  Musculoskeletal: Negative for back pain.  Neurological: Negative.   Psychiatric/Behavioral: Negative.   All other systems reviewed and are negative.      Objective:   Physical Exam Vitals and nursing note reviewed.  Constitutional:      Appearance: Normal appearance.  Cardiovascular:     Rate and Rhythm: Regular rhythm.     Heart sounds: Normal heart sounds.  Pulmonary:     Breath sounds: Normal breath sounds.  Neurological:     Mental Status: She is alert.    BP (!) 141/50   Pulse 60   Temp (!) 96.8 F (36 C) (Temporal)   Resp 20   Ht 5\' 2"  (1.575 m)   Wt 150 lb (68 kg)   SpO2 100%   BMI 27.44 kg/m          Assessment & Plan:  Kristina Gibbs in today with chief complaint of Urinary Retention   1. Dysuria - urinalysis- dip and micro  2. Acute cystitis without hematuria Take medication as prescribe Cotton underwear Take shower not bath Cranberry juice, yogurt Force fluids AZO over the counter X2 days Culture pending RTO prn  - cephALEXin (KEFLEX) 500 MG capsule; Take 1 capsule (500 mg total) by mouth 2 (two) times daily.  Dispense: 14 capsule; Refill: 0 - Urine culture  Mary-Margaret Hassell Done, FNP

## 2019-10-15 NOTE — Patient Instructions (Signed)
Urinary Tract Infection, Adult A urinary tract infection (UTI) is an infection of any part of the urinary tract. The urinary tract includes:  The kidneys.  The ureters.  The bladder.  The urethra. These organs make, store, and get rid of pee (urine) in the body. What are the causes? This is caused by germs (bacteria) in your genital area. These germs grow and cause swelling (inflammation) of your urinary tract. What increases the risk? You are more likely to develop this condition if:  You have a small, thin tube (catheter) to drain pee.  You cannot control when you pee or poop (incontinence).  You are female, and: ? You use these methods to prevent pregnancy:  A medicine that kills sperm (spermicide).  A device that blocks sperm (diaphragm). ? You have low levels of a female hormone (estrogen). ? You are pregnant.  You have genes that add to your risk.  You are sexually active.  You take antibiotic medicines.  You have trouble peeing because of: ? A prostate that is bigger than normal, if you are female. ? A blockage in the part of your body that drains pee from the bladder (urethra). ? A kidney stone. ? A nerve condition that affects your bladder (neurogenic bladder). ? Not getting enough to drink. ? Not peeing often enough.  You have other conditions, such as: ? Diabetes. ? A weak disease-fighting system (immune system). ? Sickle cell disease. ? Gout. ? Injury of the spine. What are the signs or symptoms? Symptoms of this condition include:  Needing to pee right away (urgently).  Peeing often.  Peeing small amounts often.  Pain or burning when peeing.  Blood in the pee.  Pee that smells bad or not like normal.  Trouble peeing.  Pee that is cloudy.  Fluid coming from the vagina, if you are female.  Pain in the belly or lower back. Other symptoms include:  Throwing up (vomiting).  No urge to eat.  Feeling mixed up (confused).  Being tired  and grouchy (irritable).  A fever.  Watery poop (diarrhea). How is this treated? This condition may be treated with:  Antibiotic medicine.  Other medicines.  Drinking enough water. Follow these instructions at home:  Medicines  Take over-the-counter and prescription medicines only as told by your doctor.  If you were prescribed an antibiotic medicine, take it as told by your doctor. Do not stop taking it even if you start to feel better. General instructions  Make sure you: ? Pee until your bladder is empty. ? Do not hold pee for a long time. ? Empty your bladder after sex. ? Wipe from front to back after pooping if you are a female. Use each tissue one time when you wipe.  Drink enough fluid to keep your pee pale yellow.  Keep all follow-up visits as told by your doctor. This is important. Contact a doctor if:  You do not get better after 1-2 days.  Your symptoms go away and then come back. Get help right away if:  You have very bad back pain.  You have very bad pain in your lower belly.  You have a fever.  You are sick to your stomach (nauseous).  You are throwing up. Summary  A urinary tract infection (UTI) is an infection of any part of the urinary tract.  This condition is caused by germs in your genital area.  There are many risk factors for a UTI. These include having a small, thin   tube to drain pee and not being able to control when you pee or poop.  Treatment includes antibiotic medicines for germs.  Drink enough fluid to keep your pee pale yellow. This information is not intended to replace advice given to you by your health care provider. Make sure you discuss any questions you have with your health care provider. Document Revised: 09/07/2018 Document Reviewed: 03/30/2018 Elsevier Patient Education  2020 [employer]  

## 2019-10-17 LAB — URINE CULTURE: Organism ID, Bacteria: NO GROWTH

## 2019-10-20 DIAGNOSIS — E785 Hyperlipidemia, unspecified: Secondary | ICD-10-CM | POA: Diagnosis not present

## 2019-10-20 DIAGNOSIS — I4891 Unspecified atrial fibrillation: Secondary | ICD-10-CM | POA: Diagnosis not present

## 2019-10-20 DIAGNOSIS — I252 Old myocardial infarction: Secondary | ICD-10-CM | POA: Diagnosis not present

## 2019-10-20 DIAGNOSIS — R69 Illness, unspecified: Secondary | ICD-10-CM | POA: Diagnosis not present

## 2019-10-20 DIAGNOSIS — Z7982 Long term (current) use of aspirin: Secondary | ICD-10-CM | POA: Diagnosis not present

## 2019-10-20 DIAGNOSIS — K219 Gastro-esophageal reflux disease without esophagitis: Secondary | ICD-10-CM | POA: Diagnosis not present

## 2019-10-20 DIAGNOSIS — I10 Essential (primary) hypertension: Secondary | ICD-10-CM | POA: Diagnosis not present

## 2019-10-20 DIAGNOSIS — M810 Age-related osteoporosis without current pathological fracture: Secondary | ICD-10-CM | POA: Diagnosis not present

## 2019-10-20 DIAGNOSIS — I25119 Atherosclerotic heart disease of native coronary artery with unspecified angina pectoris: Secondary | ICD-10-CM | POA: Diagnosis not present

## 2019-10-20 DIAGNOSIS — Z7901 Long term (current) use of anticoagulants: Secondary | ICD-10-CM | POA: Diagnosis not present

## 2019-10-23 ENCOUNTER — Ambulatory Visit (INDEPENDENT_AMBULATORY_CARE_PROVIDER_SITE_OTHER): Payer: Medicare HMO | Admitting: Family Medicine

## 2019-10-23 DIAGNOSIS — R350 Frequency of micturition: Secondary | ICD-10-CM | POA: Diagnosis not present

## 2019-10-23 LAB — URINALYSIS, COMPLETE
Bilirubin, UA: NEGATIVE
Glucose, UA: NEGATIVE
Ketones, UA: NEGATIVE
Nitrite, UA: NEGATIVE
Protein,UA: NEGATIVE
RBC, UA: NEGATIVE
Specific Gravity, UA: 1.01 (ref 1.005–1.030)
Urobilinogen, Ur: 0.2 mg/dL (ref 0.2–1.0)
pH, UA: 6 (ref 5.0–7.5)

## 2019-10-23 LAB — MICROSCOPIC EXAMINATION
Epithelial Cells (non renal): >10 /hpf — AB (ref 0–10)
RBC, Urine: NONE SEEN /hpf (ref 0–2)

## 2019-10-23 MED ORDER — FLUCONAZOLE 150 MG PO TABS: 150.0000 mg | ORAL_TABLET | Freq: Once | ORAL | 0 refills | Status: AC

## 2019-10-23 MED ORDER — CEPHALEXIN 500 MG PO CAPS: 500.0000 mg | ORAL_CAPSULE | Freq: Two times a day (BID) | ORAL | 0 refills | Status: AC

## 2019-10-23 NOTE — Progress Notes (Signed)
Telephone visit  Subjective: CC: UTI PCP: [name], DO [id number] Kristina Gibbs is a 74 y.o. female calls for telephone consult today. Patient provides verbal consent for consult held via phone.  Due to COVID-19 pandemic this visit was conducted virtually. This visit type was conducted due to national recommendations for restrictions regarding the COVID-19 Pandemic (e.g. social distancing, sheltering in place) in an effort to limit this patient's exposure and mitigate transmission in our community. All issues noted in this document were discussed and addressed.  A physical exam was not performed with this format.   Location of patient: home Location of provider: Working remotely from home Others present for call: none  1. UTI Patient reports that she was recently evaluated and treated for urinary tract infection with Keflex.  She did see improvement in her dysuria and urinary frequency.  Since she has been off of the antibiotics, she has been developing the symptoms again over the last 2 days.  To summarize, her urine culture had no growth.  She reports again that she is having dysuria, increased urinary frequency and urgency.  Denies any abnormal vaginal discharge but notes that she does have a spot on the vagina which was bleeding when she wiped.  She thinks this is coming from external vagina not urethra.  ROS: Per HPI  Allergies  Allergen Reactions  . Lactose Intolerance (Gi) Nausea And Vomiting and Nausea Only  . Codeine Nausea And Vomiting  . Hydrocortisone Itching   Past Medical History:  Diagnosis Date  . Allergy   . Arthritis   . Cancer ([place])    ovarian  . Coronary artery disease   . Heart murmur   . Hypercholesteremia   . Hypertension   . 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 Osteopenia   . Stomach problems   . Unstable angina ([place]) 05/27/2014    Current Outpatient Medications:  .  alendronate (FOSAMAX) 70 MG tablet, Take 1 tablet (70 mg total) by mouth  every 7 (seven) days. Take with a full glass of water on an empty stomach. Do NOT lie down for 1 hour after taking., Disp: 4 tablet, Rfl: 11 .  amLODipine (NORVASC) 5 MG tablet, TAKE 1 TABLET BY MOUTH EVERY DAY, Disp: 90 tablet, Rfl: 1 .  aspirin EC 81 MG tablet, Take 81 mg by mouth at bedtime. , Disp: , Rfl:  .  b complex vitamins tablet, Take 1 tablet by mouth daily. , Disp: , Rfl:  .  calcium carbonate (OS-CAL) 600 MG TABS, Take 600 mg by mouth daily., Disp: , Rfl:  .  cephALEXin (KEFLEX) 500 MG capsule, Take 1 capsule (500 mg total) by mouth 2 (two) times daily., Disp: 14 capsule, Rfl: 0 .  cholecalciferol (VITAMIN D) 1000 UNITS tablet, Take 1,000 Units by mouth daily., Disp: , Rfl:  .  CO ENZYME Q-10 PO, Take 1 capsule by mouth daily. , Disp: , Rfl:  .  CRESTOR 20 MG tablet, Take 1 tablet (20 mg total) by mouth daily., Disp: 90 tablet, Rfl: 3 .  ELIQUIS 5 MG TABS tablet, TAKE 1 TABLET BY MOUTH TWICE A DAY, Disp: 180 tablet, Rfl: 0 .  irbesartan (AVAPRO) 75 MG tablet, TAKE 1 TABLET (75 MG TOTAL) BY MOUTH AT BEDTIME., Disp: 90 tablet, Rfl: 3 .  memantine (NAMENDA) 10 MG tablet, Take 1 tablet (10 mg total) by mouth 2 (two) times daily., Disp: 60 tablet, Rfl: 1 .  metoprolol succinate (TOPROL-XL) 25 MG 24 hr  tablet, Take 0.5 tablets (12.5 mg total) by mouth daily., Disp: 90 tablet, Rfl: 3 .  nitroGLYCERIN (NITROLINGUAL) 0.4 MG/SPRAY spray, Place 1 spray under the tongue every 5 (five) minutes as needed for chest pain., Disp: 12 g, Rfl: 3 .  omega-3 acid ethyl esters (LOVAZA) 1 g capsule, TAKE 1 CAPSULE (1 G TOTAL) BY MOUTH 4 (FOUR) TIMES DAILY., Disp: 360 capsule, Rfl: 1 .  pantoprazole (PROTONIX) 40 MG tablet, TAKE 1 TABLET BY MOUTH EVERY DAY, Disp: 90 tablet, Rfl: 1 .  PATADAY 0.2 % SOLN, Place 1 drop into both eyes 2 (two) times daily. , Disp: , Rfl: 6 .  RANEXA 500 MG 12 hr tablet, TAKE 1 TABLET BY MOUTH TWICE A DAY, Disp: 180 tablet, Rfl: 2 .  vitamin C (ASCORBIC ACID) 500 MG tablet, Take  1,000 mg by mouth daily. , Disp: , Rfl:   Assessment/ Plan: 74 y.o. female   1. Urinary frequency I reviewed her office visit from the 11th and looked at her urinalysis results as well as her urine culture results.  She did have white blood cells on the urinalysis but no other evidence of infection.  Her urine culture was negative.  She was empirically treated with Keflex and per her report today that this did seem to help symptoms.  Given the most recent urinalysis, which demonstrated 1+ leukocytes, 6-10 white blood cells and few bacteria we will go ahead and repeat Keflex and add Diflucan to ensure no yeast causing external irritation.  I have again sent this for urine culture.  I will contact her with results once available.  If ongoing and no evidence of infection on urine culture, may need to have her evaluated by urology. - Urinalysis, Complete - Urine Culture  Meds ordered this encounter  Medications  . cephALEXin (KEFLEX) 500 MG capsule    Sig: Take 1 capsule (500 mg total) by mouth 2 (two) times daily for 7 days.    Dispense:  14 capsule    Refill:  0  . fluconazole (DIFLUCAN) 150 MG tablet    Sig: Take 1 tablet (150 mg total) by mouth once for 1 dose.    Dispense:  1 tablet    Refill:  0   Start time: 3:16pm (initial) 4:27pm (called with results and recommendations) End time: 3:20pm (initial) 4:31pm (end call for results)  Total time spent on patient care (including telephone call/ virtual visit): 20 minutes  [place], [place] [phone]

## 2019-10-24 LAB — URINE CULTURE

## 2019-10-25 ENCOUNTER — Telehealth: Payer: Self-pay | Admitting: Family Medicine

## 2019-10-25 DIAGNOSIS — Z1211 Encounter for screening for malignant neoplasm of colon: Secondary | ICD-10-CM

## 2019-10-25 NOTE — Telephone Encounter (Signed)
Please advise 

## 2019-10-25 NOTE — Telephone Encounter (Signed)
Pt called stating that she was told by Guy Begin that her PCP needed to call [phone] for confirmation that its ok to send cancer testing kit to patient.

## 2019-10-25 NOTE — Telephone Encounter (Signed)
Is it possible for you to make the call to okay it? Let me know if that will cause a problem. WS

## 2019-10-25 NOTE — Telephone Encounter (Signed)
Called and spoke with representative at [employer].  Representative states there has been no cologuard ordered. Will order cologuard.

## 2019-10-26 ENCOUNTER — Other Ambulatory Visit: Payer: Self-pay | Admitting: Family Medicine

## 2019-10-29 ENCOUNTER — Other Ambulatory Visit: Payer: Self-pay | Admitting: Cardiovascular Disease

## 2019-10-30 ENCOUNTER — Telehealth: Payer: Self-pay | Admitting: Family Medicine

## 2019-10-30 ENCOUNTER — Telehealth: Payer: Self-pay | Admitting: Cardiovascular Disease

## 2019-10-30 NOTE — Telephone Encounter (Signed)
Okay to get Covid vaccine from [place] of you but I would defer to her primary care provider

## 2019-10-30 NOTE — Telephone Encounter (Signed)
Called patient, advised of message from MD.  Patient verbalized understanding, she contacted her PCP and awaiting call back.

## 2019-10-30 NOTE — Telephone Encounter (Signed)
Patient would like a call back from Dr. Kennon Holter Nurse sometime today if possible. The patient was not specific as to the reason why she called [place], she just wanted to speak with the Nurse.

## 2019-10-30 NOTE — Telephone Encounter (Signed)
Called patient, she would like to know if from a heart stand point if she is safe to get the COVID shot- I advised patient we did recommend to get it, but she would like to check from MD.  Will route.

## 2019-11-01 DIAGNOSIS — H524 Presbyopia: Secondary | ICD-10-CM | POA: Diagnosis not present

## 2019-11-01 DIAGNOSIS — Z0100 Encounter for examination of eyes and vision without abnormal findings: Secondary | ICD-10-CM | POA: Diagnosis not present

## 2019-11-01 DIAGNOSIS — Z961 Presence of intraocular lens: Secondary | ICD-10-CM | POA: Diagnosis not present

## 2019-11-01 DIAGNOSIS — H16223 Keratoconjunctivitis sicca, not specified as Sjogren's, bilateral: Secondary | ICD-10-CM | POA: Diagnosis not present

## 2019-11-06 ENCOUNTER — Other Ambulatory Visit: Payer: Self-pay | Admitting: Family Medicine

## 2019-11-12 ENCOUNTER — Ambulatory Visit: Payer: Medicare HMO | Admitting: Family Medicine

## 2019-11-12 ENCOUNTER — Other Ambulatory Visit: Payer: Self-pay

## 2019-11-12 ENCOUNTER — Encounter: Payer: Self-pay | Admitting: Family Medicine

## 2019-11-12 VITALS — BP 148/57 | HR 58 | Temp 97.8°F | Ht 62.0 in | Wt 150.2 lb

## 2019-11-12 DIAGNOSIS — R413 Other amnesia: Secondary | ICD-10-CM

## 2019-11-12 NOTE — Patient Instructions (Addendum)
We will continue Namenda 10mg  twice daily  Stay well hydrated and eat a well balanced diet  Consider sleep apnea testing   Follow up with me in 3 months   Memory Compensation Strategies  1. Use "WARM" strategy.  W= write it down  A= associate it  R= repeat it  M= make a mental note  2.   You can keep a Social worker.  Use a 3-ring notebook with sections for the following: calendar, important names and phone numbers,  medications, doctors' names/phone numbers, lists/reminders, and a section to journal what you did  each day.   3.    Use a calendar to write appointments down.  4.    Write yourself a schedule for the day.  This can be placed on the calendar or in a separate section of the Memory Notebook.  Keeping a  regular schedule can help memory.  5.    Use medication organizer with sections for each day or morning/evening pills.  You may need help loading it  6.    Keep a basket, or pegboard by the door.  Place items that you need to take out with you in the basket or on the pegboard.  You may also want to  include a message board for reminders.  7.    Use sticky notes.  Place sticky notes with reminders in a place where the task is performed.  For example: " turn off the  stove" placed by the stove, "lock the door" placed on the door at eye level, " take your medications" on  the bathroom mirror or by the place where you normally take your medications.  8.    Use alarms/timers.  Use while cooking to remind yourself to check on food or as a reminder to take your medicine, or as a  reminder to make a call, or as a reminder to perform another task, etc.    Sleep Apnea Sleep apnea affects breathing during sleep. It causes breathing to stop for a short time or to become shallow. It can also increase the risk of:  Heart attack.  Stroke.  Being very overweight (obese).  Diabetes.  Heart failure.  Irregular heartbeat. The goal of treatment is to help you breathe  normally again. What are the causes? There are three kinds of sleep apnea:  Obstructive sleep apnea. This is caused by a blocked or collapsed airway.  Central sleep apnea. This happens when the brain does not send the right signals to the muscles that control breathing.  Mixed sleep apnea. This is a combination of obstructive and central sleep apnea. The most common cause of this condition is a collapsed or blocked airway. This can happen if:  Your throat muscles are too relaxed.  Your tongue and tonsils are too large.  You are overweight.  Your airway is too small. What increases the risk?  Being overweight.  Smoking.  Having a small airway.  Being older.  Being female.  Drinking alcohol.  Taking medicines to calm yourself (sedatives or tranquilizers).  Having family members with the condition. What are the signs or symptoms?  Trouble staying asleep.  Being sleepy or tired during the day.  Getting angry a lot.  Loud snoring.  Headaches in the morning.  Not being able to focus your mind (concentrate).  Forgetting things.  Less interest in sex.  Mood swings.  Personality changes.  Feelings of sadness (depression).  Waking up a lot during the night to  pee (urinate).  Dry mouth.  Sore throat. How is this diagnosed?  Your medical history.  A physical exam.  A test that is done when you are sleeping (sleep study). The test is most often done in a sleep lab but may also be done at home. How is this treated?   Sleeping on your side.  Using a medicine to get rid of mucus in your nose (decongestant).  Avoiding the use of alcohol, medicines to help you relax, or certain pain medicines (narcotics).  Losing weight, if needed.  Changing your diet.  Not smoking.  Using a machine to open your airway while you sleep, such as: ? An oral appliance. This is a mouthpiece that shifts your lower jaw forward. ? A CPAP device. This device blows air  through a mask when you breathe out (exhale). ? An EPAP device. This has valves that you put in each nostril. ? A BPAP device. This device blows air through a mask when you breathe in (inhale) and breathe out.  Having surgery if other treatments do not work. It is important to get treatment for sleep apnea. Without treatment, it can lead to:  High blood pressure.  Coronary artery disease.  In men, not being able to have an erection (impotence).  Reduced thinking ability. Follow these instructions at home: Lifestyle  Make changes that your doctor recommends.  Eat a healthy diet.  Lose weight if needed.  Avoid alcohol, medicines to help you relax, and some pain medicines.  Do not use any products that contain nicotine or tobacco, such as cigarettes, e-cigarettes, and chewing tobacco. If you need help quitting, ask your doctor. General instructions  Take over-the-counter and prescription medicines only as told by your doctor.  If you were given a machine to use while you sleep, use it only as told by your doctor.  If you are having surgery, make sure to tell your doctor you have sleep apnea. You may need to bring your device with you.  Keep all follow-up visits as told by your doctor. This is important. Contact a doctor if:  The machine that you were given to use during sleep bothers you or does not seem to be working.  You do not get better.  You get worse. Get help right away if:  Your chest hurts.  You have trouble breathing in enough air.  You have an uncomfortable feeling in your back, arms, or stomach.  You have trouble talking.  One side of your body feels weak.  A part of your face is hanging down. These symptoms may be an emergency. Do not wait to see if the symptoms will go away. Get medical help right away. Call your local emergency services (911 in the [place] Do not drive yourself to the hospital. Summary  This condition affects breathing during  sleep.  The most common cause is a collapsed or blocked airway.  The goal of treatment is to help you breathe normally while you sleep. This information is not intended to replace advice given to you by your health care provider. Make sure you discuss any questions you have with your health care provider. Document Revised: 07/07/2018 Document Reviewed: 05/16/2018 Elsevier Patient Education  [employer].

## 2019-11-12 NOTE — Progress Notes (Addendum)
PATIENT: Kristina Gibbs DOB: [birth year]  REASON FOR VISIT: follow up HISTORY FROM: patient  Chief Complaint  Patient presents with  . Follow-up    Rm 5, husband, pt states doing ok  . Memory Loss     HISTORY OF PRESENT ILLNESS: Today 11/12/19 Kristina Gibbs is a 74 y.o. female here today for follow up for memory loss. She was last seen in 05/2019 and was hesitant to consider memory medications at that time. Over the past few months, memory has continued to decline. She was seen by PCP, who graciously agreed to start Namenda while she awaited follow up with [place]. Since starting Namenda she feels that memory has improved. She has tolerated Namenda well with no obvious adverse effects. She reports taking Aricept in October and had dizziness and elevated blood pressures. She continues to perform ADL's independently. She drives locally and during the daytime only. No recent episodes of getting lost, last event 1-2 years ago when husband was hospitalized. She denies falls. She is eating and drinking normally. She feels that she is doing much better.   She does snore. Her husband reports occasional episodes where she will "make funny sounds at night." She wakes several times a night to use the restroom. She wakes from time to time with a dry mouth. She denies morning headaches. She sleeps from 7:30pm to 7am. She denies previous sleep study.    HISTORY: (copied from my note on 05/08/2019)  Kristina Gibbs is a 74 y.o. female here today for follow up of memory impairment. MRI in 2017 was normal for age. MMSE at that time was 27/30. Today MMSE is 20/30. She continues to note concerns of memory impairment. Her husband is with her today and reports concerns as well. He feels that her short term memory has progressively worsened over the past three years. She will repeat stories. She has a hard time remembering names and places that should be familiar to her. She is able to perform all ADL's.  She rarely drives. She denies falls or other injuries. Her mother suffered from dementia. She has an extensive cardiac history and is not certain that she wishes to take any new medications at this time. She has follow up regularly with PCP and cardiology.    HISTORY: (copied from Dr Cathren Laine note on 11/17/2015)  OEV:[id number] Kristina Addisonis a 74 y.o.femalehere as a referral from Dr. Denton Meek memory problems. PMHx CAD s/p MI 1995 with percutaneous revascularization, right coronary stenting 1996, multivessel bypass 2010, HTN, HLD, previous smoker. Memory problems for 3-4 years, no head trauma or other inciting events but possibly starting around her bypass. She started noticing she was saying things wrong, spelling things wrong, writing worsening. She forgets where she puts things, she forgets peoples names but may remember weeks later or asks her husband, they can be people she just met or people she knows. In the last 6 months it is getting worse, difficulties remembering dates, has to write things down and forgets where she put the notes and she has to search for the paper. Weeks later she finds the paper and forgets why she wrote it. She can't remember the names of people in church, these are people she has known for a few years and she is so embarrassed because she can't remember their names. Patient pays all the bills, she missed two bills recently to the Dermatologist. She drives but not long distances. She has gotten lost when she drives but she often  finds her way eventually. She is repeating things, not remembering conversations she has with noticing. Husband is noticing and daughter is noticing. No hallucinations or delusions. Having weird dreams. Has good memory for the past. More short-term and recent memory. Mother started having dementia in her 71s and in her 21s started worsening significantly. She forgets appointments. No hallucinations or delusions.  Reviewed notes, labs and imaging from  outside physicians, which showed:   11/2015: Hep c neg, CMP with 1.14 creatinine otherwise unremarkable, ldl 81  In 2015 hgba1c 5.5  Reviewed office visits from family medicine, patient complaining of memory loss for a few years, explained the time she gets to the room and forgets she went into the room for. She also forgets events. She's had several Mini-Mental status exam which have been stable from 2070 29/30. She is concerned for her short-term memory being poor. She reports that she been tested several times for dementia. She tends church and Bible studies has occasional word finding difficulty. In October 2016 patient reported memory changes stable for about a year.   REVIEW OF SYSTEMS: Out of a complete 14 system review of symptoms, the patient complains only of the following symptoms, memory loss and all other reviewed systems are negative.   ALLERGIES: Allergies  Allergen Reactions  . Lactose Intolerance (Gi) Nausea And Vomiting and Nausea Only  . Codeine Nausea And Vomiting  . Hydrocortisone Itching    HOME MEDICATIONS: Outpatient Medications Prior to Visit  Medication Sig Dispense Refill  . alendronate (FOSAMAX) 70 MG tablet Take 1 tablet (70 mg total) by mouth every 7 (seven) days. Take with a full glass of water on an empty stomach. Do NOT lie down for 1 hour after taking. 4 tablet 11  . amLODipine (NORVASC) 5 MG tablet TAKE 1 TABLET BY MOUTH EVERY DAY 90 tablet 1  . aspirin EC 81 MG tablet Take 81 mg by mouth at bedtime.     Marland Kitchen b complex vitamins tablet Take 1 tablet by mouth daily.     . calcium carbonate (OS-CAL) 600 MG TABS Take 600 mg by mouth daily.    . cholecalciferol (VITAMIN D) 1000 UNITS tablet Take 1,000 Units by mouth daily.    . CO ENZYME Q-10 PO Take 1 capsule by mouth daily.     . CRESTOR 20 MG tablet TAKE 1 TABLET BY MOUTH EVERY DAY 90 tablet 3  . ELIQUIS 5 MG TABS tablet TAKE 1 TABLET BY MOUTH TWICE A DAY 180 tablet 0  . irbesartan (AVAPRO) 75 MG tablet  TAKE 1 TABLET (75 MG TOTAL) BY MOUTH AT BEDTIME. 90 tablet 3  . memantine (NAMENDA) 10 MG tablet TAKE 1 TABLET BY MOUTH TWICE A DAY 180 tablet 1  . metoprolol succinate (TOPROL-XL) 25 MG 24 hr tablet Take 0.5 tablets (12.5 mg total) by mouth daily. 90 tablet 3  . nitroGLYCERIN (NITROLINGUAL) 0.4 MG/SPRAY spray Place 1 spray under the tongue every 5 (five) minutes as needed for chest pain. 12 g 3  . omega-3 acid ethyl esters (LOVAZA) 1 g capsule TAKE 1 CAPSULE (1 G TOTAL) BY MOUTH 4 (FOUR) TIMES DAILY. 360 capsule 1  . pantoprazole (PROTONIX) 40 MG tablet TAKE 1 TABLET BY MOUTH EVERY DAY 90 tablet 1  . PATADAY 0.2 % SOLN Place 1 drop into both eyes 2 (two) times daily.   6  . RANEXA 500 MG 12 hr tablet TAKE 1 TABLET BY MOUTH TWICE A DAY 180 tablet 2  . vitamin C (ASCORBIC  ACID) 500 MG tablet Take 1,000 mg by mouth daily.      No facility-administered medications prior to visit.    PAST MEDICAL HISTORY: Past Medical History:  Diagnosis Date  . Allergy   . Arthritis   . Cancer (HCC)    ovarian  . Coronary artery disease   . Heart murmur   . Hypercholesteremia   . Hypertension   . 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 Osteopenia   . Stomach problems   . Unstable angina (HCC) 05/27/2014    PAST SURGICAL HISTORY: Past Surgical History:  Procedure Laterality Date  . ABDOMINAL HYSTERECTOMY    . CARDIOVERSION N/A 01/25/2018   Procedure: CARDIOVERSION;  Surgeon: Nahser, Philip J, MD;  Location: [facility] END[facility]COPY;  Service: Cardiovascular;  Laterality: N/A;  . CHOLECYSTECTOMY    . CORONARY ARTERY BYPASS GRAFT    . EYE SURGERY     bilateral cataract extraction  . HAND SURGERY Left    injurred hand at work- nerve repair  . hysterectomy    . LEFT HEART CATHETERIZATION WITH CORONARY ANGIOGRAM N/A 05/27/2014   Procedure: LEFT HEART CATHETERIZATION WITH CORONARY ANGIOGRAM;  Surgeon: Jonathan J Berry, MD;  Location: [facility] CATH LAB;  Service: Cardiovascular;  Laterality: N/A;  . PCTA     x7      FAMILY HISTORY: Family History  Problem Relation Age of Onset  . Stroke Mother   . Diabetes Mother   . Kidney disease Mother   . Hypertension Mother   . Hyperlipidemia Mother   . Dementia Mother   . Heart disease Daughter        heart attack x2  . Heart disease Brother        2 heart attacks  . Breast cancer Maternal Aunt   . Colon cancer Neg Hx   . Stomach cancer Neg Hx   . Esophageal cancer Neg Hx   . Rectal cancer Neg Hx   . Liver cancer Neg Hx     SOCIAL HISTORY: Social History   Socioeconomic History  . Marital status: Married    Spouse name: Larry   . Number of children: 3  . Years of education: 12  . Highest education level: Not on file  Occupational History  . Occupation: Retired  Tobacco Use  . Smoking status: Former Smoker    Packs/day: 0.50    Years: 49.00    Pack years: 24.50    Types: Cigarettes    Quit date: 10/04/2009    Years since quitting: 10.1  . Smokeless tobacco: Never Used  Substance and Sexual Activity  . Alcohol use: Yes    Alcohol/week: 1.0 standard drinks    Types: 1 Glasses of wine per week    Comment: red wine once per month  . Drug use: No  . Sexual activity: Not Currently    Birth control/protection: Surgical, Post-menopausal  Other Topics Concern  . Not on file  Social History Narrative   Lives w/ husband   Caffeine use: 3 cups coffee/day   Social Determinants of Health   Financial Resource Strain:   . Difficulty of Paying Living Expenses: Not on file  Food Insecurity:   . Worried About Running Out of Food in the Last Year: Not on file  . Ran Out of Food in the Last Year: Not on file  Transportation Needs:   . Lack of Transportation (Medical): Not on file  . Lack of Transportation (Non-Medical): Not on file  Physical Activity:   .   Days of Exercise per Week: Not on file  . Minutes of Exercise per Session: Not on file  Stress:   . Feeling of Stress : Not on file  Social Connections:   . Frequency of Communication  with Friends and Family: Not on file  . Frequency of Social Gatherings with Friends and Family: Not on file  . Attends Religious Services: Not on file  . Active Member of Clubs or Organizations: Not on file  . Attends Club or Organization Meetings: Not on file  . Marital Status: Not on file  Intimate Partner Violence:   . Fear of Current or Ex-Partner: Not on file  . Emotionally Abused: Not on file  . Physically Abused: Not on file  . Sexually Abused: Not on file      PHYSICAL EXAM  Vitals:   11/12/19 1131  BP: (!) 148/57  Pulse: (!) 58  Temp: 97.8 F (36.6 C)  Weight: 150 lb 3.2 oz (68.1 kg)  Height: 5' 2" (1.575 m)   Body mass index is 27.47 kg/m.  Generalized: Well developed, in no acute distress  Cardiology: normal rate and rhythm, no murmur noted Respiratory: Clear to auscultation bilaterally  Neurological examination  Mentation: Alert oriented to time, place, history taking. Follows all commands speech and language fluent Cranial nerve II-XII: Pupils were equal round reactive to light. Extraocular movements were full, visual field were full  Motor: The motor testing reveals 5 over 5 strength of all 4 extremities. Good symmetric motor tone is noted throughout.  Gait and station: Gait is normal.   DIAGNOSTIC DATA (LABS, IMAGING, TESTING) - I reviewed patient records, labs, notes, testing and imaging myself where available.  MMSE - Mini Mental State Exam 11/12/2019 09/12/2019 05/08/2019  Not completed: - - (No Data)  Orientation to time 3 4 4  Orientation to Place 3 4 3  Registration 3 3 3  Attention/ Calculation 1 3 1  Recall 0 2 1  Language- name 2 objects 2 2 2  Language- repeat 0 0 1  Language- follow 3 step command 3 3 2  Language- follow 3 step command-comments - - She held the paper in the air  Language- read & follow direction 1 1 1  Write a sentence 1 1 1  Copy design 1 1 1  Total score 18 24 20     Lab Results  Component Value Date   WBC 5.8  10/23/2018   HGB 12.6 10/23/2018   HCT 38.3 10/23/2018   MCV 90 10/23/2018   PLT 271 10/23/2018      Component Value Date/Time   NA 134 07/17/2019 0955   K 4.5 07/17/2019 0955   CL 98 07/17/2019 0955   CO2 25 07/17/2019 0955   GLUCOSE 96 07/17/2019 0955   GLUCOSE 87 03/05/2017 1616   BUN 14 07/17/2019 0955   CREATININE 1.15 (H) 07/17/2019 0955   CREATININE 1.13 (H) 04/17/2013 0903   CALCIUM 9.1 07/17/2019 0955   PROT 6.7 07/17/2019 0955   ALBUMIN 4.2 07/17/2019 0955   AST 21 07/17/2019 0955   ALT 19 07/17/2019 0955   ALKPHOS 59 07/17/2019 0955   BILITOT 0.7 07/17/2019 0955   GFRNONAA 47 (Kristina) 07/17/2019 0955   GFRNONAA 50 (Kristina) 04/17/2013 0903   GFRAA 54 (Kristina) 07/17/2019 0955   GFRAA 58 (Kristina) 04/17/2013 0903   Lab Results  Component Value Date   CHOL 162 10/23/2018   HDL 86 10/23/2018   LDLCALC 63 10/23/2018   TRIG 64 10/23/2018     CHOLHDL 1.9 10/23/2018   Lab Results  Component Value Date   HGBA1C 5.5 05/26/2014   Lab Results  Component Value Date   [id number] 557 02/20/2019   Lab Results  Component Value Date   TSH 5.240 (H) 02/20/2019       ASSESSMENT AND PLAN 74 y.o. year old female  has a past medical history of Allergy, Arthritis, Cancer ([place]), Coronary artery disease, Heart murmur, Hypercholesteremia, Hypertension, Memory impairment, Osteopenia, Stomach problems, and Unstable angina ([place]) (05/27/2014). here with     ICD-10-CM   1. Memory impairment  R41.3     Mrs Sherlin feels that memory has improved since starting Namenda. She reports being able to recall information quicker and has less short term memory loss. Her husband is not certain of benefit at this time. She is tolerating medication well. Apparently she did experience dizziness and elevated blood pressure with Aricept. She will continue current plan with Namenda 13m twice daily. MMSE is 18/30 today; 24/30 in 09/2019. She was unable to perform recall and serial subtractions today. We have discussed  considering additional testing for evaluation of memory loss. MRI unremarkable in 2017. She has never had formal neurocognitive testing. Mom did have dementia but unsure of type. These tests would not change treatment plan. We have also discussed sleep evaluation for concerns of snoring and possible apneic events. Patient and her husband will discuss this option and let me know if they wish to proceed with testing. CPAP therapy could help if apneic contributing to memory complaints. She will continue activity as tolerated. Regular exercise encouraged. Well balanced diet and adequate hydration will help. Caution with driving. I recommend local (2-3 mile radius) driving during daytime hours only. She will follow up with me in 3 months, sooner if needed. She verbalizes understanding and agreement with this plan.    No orders of the defined types were placed in this encounter.    No orders of the defined types were placed in this encounter.     I spent 30 minutes with the patient. 50% of this time was spent counseling and educating patient on plan of care and medications.    ADebbora Presto FNP-C 11/12/2019, 12:55 PM [facility] [address] [place] [address]([phone] Made any corrections needed, and agree with history, physical, neuro exam,assessment and plan as stated.     ASarina Ill MD [facility]

## 2019-11-13 ENCOUNTER — Telehealth: Payer: Self-pay | Admitting: Cardiovascular Disease

## 2019-11-13 NOTE — Telephone Encounter (Signed)
New Message  Patient is calling in to speak with Dr. Kennon Holter nurse about her medication. She needs to go over which medication she needs to be taking. Please give patient a call back to discuss.

## 2019-11-13 NOTE — Telephone Encounter (Signed)
Patient calling back to update. She states her pharmacy called and told her the medication was going to cost her $200. She would like to know if there is anyway for it to be made cheaper.

## 2019-11-13 NOTE — Telephone Encounter (Signed)
Returned call to patient, patient is confused about her medication.  She threw the bottle away and she believes it was rosuvastatin (Crestor) but wanted to confirm.   Advised she is suppose to be taking rosuvastatin (Crestor) 20 mg daily.   She also states that insurance doesn't want to pay for Ranexa.  She states she has tried the alternatives and they did not work.  Advised we may need to do PA for medication.   Will route to primary to follow up.      Patient aware.

## 2019-11-14 DIAGNOSIS — D229 Melanocytic nevi, unspecified: Secondary | ICD-10-CM | POA: Diagnosis not present

## 2019-11-14 DIAGNOSIS — L821 Other seborrheic keratosis: Secondary | ICD-10-CM | POA: Diagnosis not present

## 2019-11-14 DIAGNOSIS — D692 Other nonthrombocytopenic purpura: Secondary | ICD-10-CM | POA: Diagnosis not present

## 2019-11-15 ENCOUNTER — Other Ambulatory Visit: Payer: Self-pay | Admitting: Family Medicine

## 2019-11-15 ENCOUNTER — Other Ambulatory Visit: Payer: Self-pay | Admitting: Cardiovascular Disease

## 2019-11-15 NOTE — Telephone Encounter (Signed)
Rx has been sent to the pharmacy electronically. ° °

## 2019-11-16 ENCOUNTER — Other Ambulatory Visit: Payer: Self-pay | Admitting: Cardiovascular Disease

## 2019-11-16 ENCOUNTER — Telehealth: Payer: Self-pay

## 2019-11-16 ENCOUNTER — Other Ambulatory Visit: Payer: Self-pay | Admitting: Family Medicine

## 2019-11-16 MED ORDER — CRESTOR 20 MG PO TABS: 20.0000 mg | ORAL_TABLET | Freq: Every day | ORAL | 10 refills | Status: DC

## 2019-11-16 NOTE — Telephone Encounter (Signed)
Requested Prescriptions   Signed Prescriptions Disp Refills   CRESTOR 20 MG tablet 30 tablet 10    Sig: Take 1 tablet (20 mg total) by mouth daily.    Authorizing Provider: [name]    Ordering User: Raelene Bott, Airyonna Franklyn L

## 2019-11-16 NOTE — Telephone Encounter (Signed)
°*  STAT* If patient is at the pharmacy, call can be transferred to refill team.   1. Which medications need to be refilled? (please list name of each medication and dose if known) Crestor   2. Which pharmacy/location (including street and city if local pharmacy) is medication to be sent to? [facility]  3. Do they need a 30 day or 90 day supply? 30 patient also ask will her insurance will pay for this medication.

## 2019-11-16 NOTE — Telephone Encounter (Signed)
We have mailed a cologuard recall letter to patients home address on file.

## 2019-11-19 NOTE — Telephone Encounter (Signed)
Please advise, I am unsure if patient should be on this, not currently on med list.  Thank you!

## 2019-11-21 ENCOUNTER — Other Ambulatory Visit: Payer: Self-pay

## 2019-11-21 ENCOUNTER — Ambulatory Visit (INDEPENDENT_AMBULATORY_CARE_PROVIDER_SITE_OTHER): Payer: Medicare HMO | Admitting: *Deleted

## 2019-11-21 DIAGNOSIS — Z0000 Encounter for general adult medical examination without abnormal findings: Secondary | ICD-10-CM

## 2019-11-21 NOTE — Progress Notes (Signed)
MEDICARE ANNUAL WELLNESS VISIT  11/21/2019  Telephone Visit Disclaimer This Medicare AWV was conducted by telephone due to national recommendations for restrictions regarding the COVID-19 Pandemic (e.g. social distancing).  I verified, using two identifiers, that I am speaking with Kristina Gibbs or their authorized healthcare agent. I discussed the limitations, risks, security, and privacy concerns of performing an evaluation and management service by telephone and the potential availability of an in-person appointment in the future. The patient expressed understanding and agreed to proceed.   Subjective:  Kristina Gibbs is a 74 y.o. female patient of Kristina Norlander, DO who had a Medicare Annual Wellness Visit today via telephone. Kristina Gibbs is Retired and lives with their spouse. she has 3 children. she reports that she is socially active and does interact with friends/family regularly. she is moderately physically active and enjoys being outside, taking drives with her husband to the mountains, sitting on her porch and staying active in [place].  Patient Care Team: Kristina Norlander, DO as PCP - General (Family Medicine) [name], MD as Consulting Physician (Cardiology) Milus Banister, MD as Attending Physician (Gastroenterology)  Advanced Directives 11/21/2019 09/18/2019 04/17/2018 03/05/2017 03/05/2017 06/08/2016 03/25/2015  Does Patient Have a Medical Advance Directive? Yes Yes No Yes Yes Yes Yes  Type of Paramedic of [place];Living will - - Healthcare Power of [place]  Does patient want to make changes to medical advance directive? No - Patient declined - - No - Patient declined - - -  Copy of [place] in Chart? No - copy requested - - No - copy requested - No - copy requested No - copy requested  Would patient like information on  creating a medical advance directive? - - Yes (MAU/Ambulatory/Procedural Areas - Information given) - - - [facility] Utilization Over the Past 12 Months: # of hospitalizations or ER visits: 0 # of surgeries: 0  Review of Systems    Patient reports that her overall health is unchanged compared to last year.  History obtained from chart review  Patient Reported Readings (BP, Pulse, CBG, Weight, etc) none  Pain Assessment Pain : No/denies pain     Current Medications & Allergies (verified) Allergies as of 11/21/2019      Reactions   Lactose Intolerance (gi) Nausea And Vomiting, Nausea Only   Codeine Nausea And Vomiting   Hydrocortisone Itching      Medication List       Accurate as of November 21, 2019  9:26 AM. If you have any questions, ask your nurse or doctor.        alendronate 70 MG tablet Commonly known as: FOSAMAX Take 1 tablet (70 mg total) by mouth every 7 (seven) days. Take with a full glass of water on an empty stomach. Do NOT lie down for 1 hour after taking.   amLODipine 5 MG tablet Commonly known as: NORVASC TAKE 1 TABLET BY MOUTH EVERY DAY   aspirin EC 81 MG tablet Take 81 mg by mouth at bedtime.   b complex vitamins tablet Take 1 tablet by mouth daily.   calcium carbonate 600 MG Tabs tablet Commonly known as: OS-CAL Take 600 mg by mouth daily.   cholecalciferol 1000 units tablet Commonly known as: VITAMIN D Take 1,000 Units by mouth daily.   CO ENZYME Q-10 PO Take 1 capsule by mouth daily.  Crestor 20 MG tablet Generic drug: rosuvastatin Take 1 tablet (20 mg total) by mouth daily.   Eliquis 5 MG Tabs tablet Generic drug: apixaban TAKE 1 TABLET BY MOUTH TWICE A DAY   irbesartan 75 MG tablet Commonly known as: AVAPRO TAKE 1 TABLET (75 MG TOTAL) BY MOUTH AT BEDTIME.   memantine 10 MG tablet Commonly known as: NAMENDA TAKE 1 TABLET BY MOUTH TWICE A DAY   metoprolol succinate 25 MG 24 hr tablet Commonly known as: TOPROL-XL Take  0.5 tablets (12.5 mg total) by mouth daily.   nitroGLYCERIN 0.4 MG/SPRAY spray Commonly known as: NITROLINGUAL PLACE 1 SPRAY UNDER THE TONGUE EVERY 5 (FIVE) MINUTES AS NEEDED FOR CHEST PAIN.   omega-3 acid ethyl esters 1 g capsule Commonly known as: LOVAZA TAKE 1 CAPSULE (1 G TOTAL) BY MOUTH 4 (FOUR) TIMES DAILY.   pantoprazole 40 MG tablet Commonly known as: PROTONIX TAKE 1 TABLET BY MOUTH EVERY DAY   Pataday 0.2 % Soln Generic drug: Olopatadine HCl Place 1 drop into both eyes 2 (two) times daily.   Ranexa 500 MG 12 hr tablet Generic drug: ranolazine TAKE 1 TABLET BY MOUTH TWICE A DAY   vitamin C 500 MG tablet Commonly known as: ASCORBIC ACID Take 1,000 mg by mouth daily.       History (reviewed): Past Medical History:  Diagnosis Date  . Allergy   . Arthritis   . Cancer ([place])    ovarian  . Coronary artery disease   . Heart murmur   . Hypercholesteremia   . Hypertension   . 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 Osteopenia   . Stomach problems   . Unstable angina ([place]) 05/27/2014   Past Surgical History:  Procedure Laterality Date  . ABDOMINAL HYSTERECTOMY    . CARDIOVERSION N/A 01/25/2018   Procedure: CARDIOVERSION;  Surgeon: Acie Fredrickson Wonda Cheng, MD;  Location: [facility];  Service: Cardiovascular;  Laterality: N/A;  . CHOLECYSTECTOMY    . CORONARY ARTERY BYPASS GRAFT    . EYE SURGERY     bilateral cataract extraction  . HAND SURGERY Left    injurred hand at work- Programmer, applications  . hysterectomy    . LEFT HEART CATHETERIZATION WITH CORONARY ANGIOGRAM N/A 05/27/2014   Procedure: LEFT HEART CATHETERIZATION WITH CORONARY ANGIOGRAM;  Surgeon: [name], MD;  [facility] C[facility];  Service: Cardiovascular;  Laterality: N/A;  . PCTA     x7    Family History  Problem Relation Age of Onset  . Stroke Mother   . Diabetes Mother   . Kidney disease Mother   . Hypertension Mother   . Hyperlipidemia Mother   . Dementia Mother   . Heart disease Daughter         heart attack x2  . Heart disease Brother        2 heart attacks  . Breast cancer Maternal Aunt   . Colon cancer Neg Hx   . Stomach cancer Neg Hx   . Esophageal cancer Neg Hx   . Rectal cancer Neg Hx   . Liver cancer Neg Hx    Social History   Socioeconomic History  . Marital status: Married    Spouse name: [name]   . Number of children: 3  . Years of education: 23  . Highest education level: High school graduate  Occupational History  . Occupation: Retired  Tobacco Use  . Smoking status: Former Smoker    Packs/day: 0.50    Years:  49.00    Pack years: 24.50    Types: Cigarettes    Quit date: 10/04/2008    Years since quitting: 11.1  . Smokeless tobacco: Never Used  Substance and Sexual Activity  . Alcohol use: Yes    Alcohol/week: 1.0 standard drinks    Types: 1 Glasses of wine per week    Comment: red wine once per month  . Drug use: No  . Sexual activity: Not Currently    Birth control/protection: Surgical  Other Topics Concern  . Not on file  Social History Narrative   Lives w/ husband   Caffeine use: 3 cups coffee/day   Social Determinants of Health   Financial Resource Strain: Low Risk   . Difficulty of Paying Living Expenses: Not hard at all  Food Insecurity: No Food Insecurity  . Worried About Charity fundraiser in the Last Year: Never true  . Ran Out of Food in the Last Year: Never true  Transportation Needs: No Transportation Needs  . Lack of Transportation (Medical): No  . Lack of Transportation (Non-Medical): No  Physical Activity: Sufficiently Active  . Days of Exercise per Week: 7 days  . Minutes of Exercise per Session: 30 min  Stress: No Stress Concern Present  . Feeling of Stress : Not at all  Social Connections: Not Isolated  . Frequency of Communication with Friends and Family: More than three times a week  . Frequency of Social Gatherings with Friends and Family: More than three times a week  . Attends Religious Services: More than 4 times  per year  . Active Member of Clubs or Organizations: Yes  . Attends Archivist Meetings: More than 4 times per year  . Marital Status: Married    Activities of Daily Living In your present state of health, do you have any difficulty performing the following activities: 11/21/2019  Hearing? N  Vision? N  Comment wears glasses-gets yearly eye exam  Difficulty concentrating or making decisions? Y  Comment pt is taking Namenda-sees a Garment/textile technologist as well  Walking or climbing stairs? N  Dressing or bathing? N  Doing errands, shopping? Y  Comment she can drive locally but her husband drives her to all other appointments and to do errands  Preparing Food and eating ? N  Using the Toilet? N  In the past six months, have you accidently leaked urine? N  Do you have problems with loss of bowel control? N  Managing your Medications? N  Managing your Finances? Y  Comment her husband takes care of the finances  Housekeeping or managing your Housekeeping? N  Some recent data might be hidden    Patient Education/ Literacy How often do you need to have someone help you when you read instructions, pamphlets, or other written materials from your doctor or pharmacy?: 1 - Never What is the last grade level you completed in school?: 12th grade  Exercise Current Exercise Habits: Home exercise routine, Type of exercise: walking, Time (Minutes): 30, Frequency (Times/Week): 7, Weekly Exercise (Minutes/Week): 210, Intensity: Mild, Exercise limited by: cardiac condition(s)  Diet Patient reports consuming 3 meals a day and 0 snack(s) a day Patient reports that her primary diet is: Regular Patient reports that she does have regular access to food.   Depression Screen PHQ 2/9 Scores 11/21/2019 10/15/2019 09/12/2019 08/01/2019 06/06/2019 02/20/2019 10/23/2018  PHQ - 2 Score 0 0 2 0 0 0 0  PHQ- 9 Score - - 7 0 0 0 0  Fall Risk Fall Risk  11/21/2019 10/15/2019 09/12/2019 08/01/2019 06/06/2019  Falls in  the past year? 0 0 1 0 0  Number falls in past yr: - - - - -  Comment - - - - -  Injury with Fall? - - 0 - -  Comment - - - - -  Risk for fall due to : - - History of fall(s) - -  Follow up - - Falls prevention discussed - -     Objective:  Luiz Ochoa Hollingworth seemed alert and oriented and she participated appropriately during our telephone visit.  Blood Pressure Weight BMI  BP Readings from Last 3 Encounters:  11/12/19 (!) 148/57  10/15/19 (!) 141/50  10/10/19 (!) 154/42   Wt Readings from Last 3 Encounters:  11/12/19 150 lb 3.2 oz (68.1 kg)  10/15/19 150 lb (68 kg)  10/10/19 150 lb 9.6 oz (68.3 kg)   BMI Readings from Last 1 Encounters:  11/12/19 27.47 kg/m    *Unable to obtain current vital signs, weight, and BMI due to telephone visit type  Hearing/Vision  . [place] did not seem to have difficulty with hearing/understanding during the telephone conversation . Reports that she has had a formal eye exam by an eye care professional within the past year . Reports that she has not had a formal hearing evaluation within the past year *Unable to fully assess hearing and vision during telephone visit type  Cognitive Function: 6CIT Screen 11/21/2019  What Year? 0 points  What month? 0 points  What time? 0 points  Count back from 20 2 points  Months in reverse 4 points  Repeat phrase 2 points  Total Score 8   (Normal:0-7, Significant for Dysfunction: >8)  Normal Cognitive Function Screening: No: which was expected-pt is taking Namenda and follows with a Risk manager & Health Maintenance Record Immunization History  Administered Date(s) Administered  . Fluad Quad(high Dose 65+) 06/06/2019  . Influenza, High Dose Seasonal PF 07/12/2016, 07/12/2017, 08/09/2018  . Influenza,inj,Quad PF,6+ Mos 07/30/2013, 07/02/2014, 07/07/2015  . Pneumococcal Conjugate-13 02/24/2015  . Pneumococcal Polysaccharide-23 03/23/2011  . Tdap 06/23/2010  . Zoster 05/08/2010     Health Maintenance  Topic Date Due  . Fecal DNA (Cologuard)  08/24/2019  . DEXA SCAN  06/21/2020  . TETANUS/TDAP  06/23/2020  . INFLUENZA VACCINE  Completed  . Hepatitis C Screening  Completed  . PNA vac Low Risk Adult  Completed       Assessment  This is a routine wellness examination for Latausha L Witthuhn.  Health Maintenance: Due or Overdue Health Maintenance Due  Topic Date Due  . Fecal DNA (Cologuard)  08/24/2019    Luiz Ochoa Hollabaugh does not need a referral for Community Assistance: Care Management:   no Social Work:    no Prescription Assistance:  no Nutrition/Diabetes Education:  no   Plan:  Personalized Goals Goals Addressed            This Visit's Progress   . DIET - INCREASE WATER INTAKE       Try to drink 6-8 glasses of water daily      Personalized Health Maintenance & Screening Recommendations  Colorectal cancer screening Advanced directives: has an advanced directive - a copy HAS NOT been provided.  Lung Cancer Screening Recommended: no (Low Dose CT Chest recommended if Age 35-80 years, 30 pack-year currently smoking OR have quit w/in past 15 years) Hepatitis C Screening recommended: no HIV Screening recommended: no  Advanced Directives:  Written information was not prepared per patient's request.  Referrals & Orders No orders of the defined types were placed in this encounter.   Follow-up Plan . Follow-up with Kristina Norlander, DO as planned . Return the Cologuard kit for your colon cancer screening as discussed . Bring a copy of your Advanced Directives in for our records   I have personally reviewed and noted the following in the patient's chart:   . Medical and social history . Use of alcohol, tobacco or illicit drugs  . Current medications and supplements . Functional ability and status . Nutritional status . Physical activity . Advanced directives . List of other physicians . Hospitalizations, surgeries, and ER  visits in previous 12 months . Vitals . Screenings to include cognitive, depression, and falls . Referrals and appointments  In addition, I have reviewed and discussed with Luiz Ochoa Sharrar certain preventive protocols, quality metrics, and best practice recommendations. A written personalized care plan for preventive services as well as general preventive health recommendations is available and can be mailed to the patient at her request.      Milas Hock, LPN  [id number]/[id number]

## 2019-11-21 NOTE — Patient Instructions (Signed)
Preventive Care 38 Years and Older, Female Preventive care refers to lifestyle choices and visits with your health care provider that can promote health and wellness. This includes:  A yearly physical exam. This is also called an annual well check.  Regular dental and eye exams.  Immunizations.  Screening for certain conditions.  Healthy lifestyle choices, such as diet and exercise. What can I expect for my preventive care visit? Physical exam Your health care provider will check:  Height and weight. These may be used to calculate body mass index (BMI), which is a measurement that tells if you are at a healthy weight.  Heart rate and blood pressure.  Your skin for abnormal spots. Counseling Your health care provider may ask you questions about:  Alcohol, tobacco, and drug use.  Emotional well-being.  Home and relationship well-being.  Sexual activity.  Eating habits.  History of falls.  Memory and ability to understand (cognition).  Work and work Statistician.  Pregnancy and menstrual history. What immunizations do I need?  Influenza (flu) vaccine  This is recommended every year. Tetanus, diphtheria, and pertussis (Tdap) vaccine  You may need a Td booster every 10 years. Varicella (chickenpox) vaccine  You may need this vaccine if you have not already been vaccinated. Zoster (shingles) vaccine  You may need this after age 33. Pneumococcal conjugate (PCV13) vaccine  One dose is recommended after age 33. Pneumococcal polysaccharide (PPSV23) vaccine  One dose is recommended after age 72. Measles, mumps, and rubella (MMR) vaccine  You may need at least one dose of MMR if you were born in 1957 or later. You may also need a second dose. Meningococcal conjugate (MenACWY) vaccine  You may need this if you have certain conditions. Hepatitis A vaccine  You may need this if you have certain conditions or if you travel or work in places where you may be exposed  to hepatitis A. Hepatitis B vaccine  You may need this if you have certain conditions or if you travel or work in places where you may be exposed to hepatitis B. Haemophilus influenzae type b (Hib) vaccine  You may need this if you have certain conditions. You may receive vaccines as individual doses or as more than one vaccine together in one shot (combination vaccines). Talk with your health care provider about the risks and benefits of combination vaccines. What tests do I need? Blood tests  Lipid and cholesterol levels. These may be checked every 5 years, or more frequently depending on your overall health.  Hepatitis C test.  Hepatitis B test. Screening  Lung cancer screening. You may have this screening every year starting at age 39 if you have a 30-pack-year history of smoking and currently smoke or have quit within the past 15 years.  Colorectal cancer screening. All adults should have this screening starting at age 36 and continuing until age 15. Your health care provider may recommend screening at age 23 if you are at increased risk. You will have tests every 1-10 years, depending on your results and the type of screening test.  Diabetes screening. This is done by checking your blood sugar (glucose) after you have not eaten for a while (fasting). You may have this done every 1-3 years.  Mammogram. This may be done every 1-2 years. Talk with your health care provider about how often you should have regular mammograms.  BRCA-related cancer screening. This may be done if you have a family history of breast, ovarian, tubal, or peritoneal cancers.  Other tests  Sexually transmitted disease (STD) testing.  Bone density scan. This is done to screen for osteoporosis. You may have this done starting at age 44. Follow these instructions at home: Eating and drinking  Eat a diet that includes fresh fruits and vegetables, whole grains, lean protein, and low-fat dairy products. Limit  your intake of foods with high amounts of sugar, saturated fats, and salt.  Take vitamin and mineral supplements as recommended by your health care provider.  Do not drink alcohol if your health care provider tells you not to drink.  If you drink alcohol: ? Limit how much you have to 0-1 drink a day. ? Be aware of how much alcohol is in your drink. In the [place] one drink equals one 12 oz bottle of beer (355 mL), one 5 oz glass of wine (148 mL), or one 1 oz glass of hard liquor (44 mL). Lifestyle  Take daily care of your teeth and gums.  Stay active. Exercise for at least 30 minutes on 5 or more days each week.  Do not use any products that contain nicotine or tobacco, such as cigarettes, e-cigarettes, and chewing tobacco. If you need help quitting, ask your health care provider.  If you are sexually active, practice safe sex. Use a condom or other form of protection in order to prevent STIs (sexually transmitted infections).  Talk with your health care provider about taking a low-dose aspirin or statin. What's next?  Go to your health care provider once a year for a well check visit.  Ask your health care provider how often you should have your eyes and teeth checked.  Stay up to date on all vaccines. This information is not intended to replace advice given to you by your health care provider. Make sure you discuss any questions you have with your health care provider. Document Revised: 09/14/2018 Document Reviewed: 09/14/2018 Elsevier Patient Education  2020 [employer].

## 2019-11-23 NOTE — Telephone Encounter (Signed)
Pt needing PA for Ranexa but not seeing in last 2 office notes why she was put on it.

## 2019-11-30 ENCOUNTER — Telehealth: Payer: Self-pay | Admitting: Cardiovascular Disease

## 2019-11-30 NOTE — Telephone Encounter (Signed)
Pt c/o medication issue:  1. Name of Medication: CRESTOR 20 MG tablet  2. How are you currently taking this medication (dosage and times per day)? 1 tablet by mouth daily   3. Are you having a reaction (difficulty breathing--STAT)? No  4. What is your medication issue? Kristina Gibbs is calling stating her insurance needs prior authorization for the brand name medication. She would like Dr. Gwenlyn Found to call [phone] to resolve this issue.

## 2019-12-01 NOTE — Telephone Encounter (Signed)
Kristina Gibbs, can you please take care of this??  JJB

## 2019-12-03 ENCOUNTER — Other Ambulatory Visit: Payer: Self-pay | Admitting: Family Medicine

## 2019-12-10 DIAGNOSIS — Z23 Encounter for immunization: Secondary | ICD-10-CM | POA: Diagnosis not present

## 2019-12-18 NOTE — Telephone Encounter (Signed)
PA questions answered and faxed to [employer] @ [phone].

## 2019-12-20 ENCOUNTER — Other Ambulatory Visit: Payer: Self-pay | Admitting: Family Medicine

## 2019-12-20 DIAGNOSIS — Z1231 Encounter for screening mammogram for malignant neoplasm of breast: Secondary | ICD-10-CM

## 2019-12-20 NOTE — Telephone Encounter (Signed)
crestor has been approved

## 2019-12-24 DIAGNOSIS — Z23 Encounter for immunization: Secondary | ICD-10-CM | POA: Diagnosis not present

## 2020-01-12 ENCOUNTER — Other Ambulatory Visit: Payer: Self-pay | Admitting: Family Medicine

## 2020-01-23 ENCOUNTER — Other Ambulatory Visit: Payer: Self-pay | Admitting: Family Medicine

## 2020-02-04 ENCOUNTER — Ambulatory Visit
Admission: RE | Admit: 2020-02-04 | Discharge: 2020-02-04 | Disposition: A | Discharge status: Home / Self Care | Payer: Medicare HMO | Source: Ambulatory Visit | Attending: Family Medicine | Admitting: Family Medicine

## 2020-02-04 ENCOUNTER — Other Ambulatory Visit: Payer: Self-pay

## 2020-02-04 DIAGNOSIS — Z1231 Encounter for screening mammogram for malignant neoplasm of breast: Secondary | ICD-10-CM

## 2020-02-05 ENCOUNTER — Telehealth: Payer: Self-pay | Admitting: Cardiovascular Disease

## 2020-02-05 NOTE — Telephone Encounter (Signed)
Spoke with patient. She reports her insurance company is trying to get her to change her Eliquis and Ranexa. She isn't sure what alternatives they are wanting her to try, but knows the Ranexa alternative she tried in the past did not work.   Patient wants Dr. Gwenlyn Found to call [facility] and tell them to leave her medications alone. She states she is tired of going through this each year. Informed patient that unfortunately Dr. Gwenlyn Found can't just call and tell [employer] not to make changes and not to ask again.  Informed patient if a prior authorization is received for medication a nurse will complete the form and submit it to insurance to assist with coverage. Will route to nurses covering Dr. Gwenlyn Found to see if any forms have been received for completion.

## 2020-02-05 NOTE — Telephone Encounter (Signed)
Patient needs prior authorizations for Ranexa and Eliquis.Message sent to prior authorization nurse.

## 2020-02-05 NOTE — Telephone Encounter (Signed)
Pt c/o medication issue:  1. Name of Medication: apixaban (ELIQUIS) 5 MG TABS tablet and RANEXA 500 MG 12 hr tablet  2. How are you currently taking this medication (dosage and times per day)? As directed  3. Are you having a reaction (difficulty breathing--STAT)? no  4. What is your medication issue? Patient states that [facility] keeps trying to change this medication. She states that they have been trying to change it for years and she is fed up. She would like Dr. Gwenlyn Found to contact her insurance company and advise them to stop trying to change her medication. Please advise.

## 2020-02-06 NOTE — Telephone Encounter (Signed)
**Note De-Identified  Obfuscation** I attempted an Eliquis PA through covermymeds a received the following message:  Kristina Gibbs Key: [id number]  Outcome  The patient currently has access to the requested medication and a Prior Authorization is not needed for the patient/medication.  Drug: Eliquis 5MG  tablets  [employer] Medicare Electronic PA Form [phone] NCPDP)  I called [facility] in [place] and s/w [place] who advised me that the pts ins is paying their part and that a PA is not required.  The cost to the pt at last refill in March was $79.11/30 day supply. Sherren Mocha states that the pts ins plan may not be "that great" and this is the amount the pt owes for a 30 day supply of Eliquis.  I will attempt a Eliquis tier exception but it is unlikely that it will be approved.

## 2020-02-06 NOTE — Telephone Encounter (Addendum)
**Note De-Identified  Obfuscation** I started a Ranexa PA through covermymeds: Key: [id number]  Following message received from covermymeds: Kale Formanek Key: [id number] - PA Case ID: [id number] Need help? Call us at [phone]  Outcome  Approved today  Your request has been approved  DrugRanexa 500MG  er tablets  Form [employer] Medicare Electronic PA Form [phone] NCPDP)  A fax will be sent to Dr Kennon Holter office with approval details.

## 2020-02-06 NOTE — Telephone Encounter (Signed)
I started an Eliquis tier exception through covermymeds: Key: [id number]

## 2020-02-07 ENCOUNTER — Other Ambulatory Visit: Payer: Self-pay | Admitting: Family Medicine

## 2020-02-08 NOTE — Telephone Encounter (Signed)
Received fax from [place], tier request denied, eliquis is already on the lowest tier allowed by the insurance.

## 2020-02-11 NOTE — Telephone Encounter (Signed)
**Note De-Identified  Obfuscation** I called the pt and made her aware that we received an approval on her Ranexa PA and that a PA was not required for her Eliquis and that her insurance plan is covering their part. I also made her aware that I attempted an Eliquis tier exceptionas well but received a denial as Eliquis is a name brand medication that is already at the lowest tier possible for a name brand medication..  She states that she did pick her Ranexa up yesterday and thanked me for our help with that.  She states that she is paying around $45 a month for Eliquis (normal monthly cost with ins coverage so it does not sound like she is in the donut hole).) and that she wants the cost to be less. I discussed Warfarin with her but she is not interested at this time. I offered her [employer] programs phone number to call and discuss her eligibility with them. She is aware to ask them to mail her an application to her home and not the office as she will need to complete her part of the application, obtain requires documents, drop all off at Dr Rachel Bo office and that we will handle the provider part and fax all to [employer] program.

## 2020-02-25 ENCOUNTER — Other Ambulatory Visit: Payer: Self-pay | Admitting: Cardiovascular Disease

## 2020-02-25 ENCOUNTER — Other Ambulatory Visit: Payer: Self-pay | Admitting: Family Medicine

## 2020-02-25 NOTE — Telephone Encounter (Signed)
Patient states Dr. Kennon Holter office fills this medication for her. She will contact them to fill.

## 2020-02-25 NOTE — Telephone Encounter (Signed)
New Message    *STAT* If patient is at the pharmacy, call can be transferred to refill team.   1. Which medications need to be refilled? (please list name of each medication and dose if known) apixaban (ELIQUIS) 5 MG TABS tablet    2. Which pharmacy/location (including street and city if local pharmacy) is medication to be sent to? [id number] [place] [place] [facility] [id number] [place] [place]  3. Do they need a 30 day or 90 day supply? [place]

## 2020-02-25 NOTE — Telephone Encounter (Signed)
[place]. NTBS 30 days given 01/23/20

## 2020-02-28 MED ORDER — APIXABAN 5 MG PO TABS: 5.0000 mg | ORAL_TABLET | Freq: Two times a day (BID) | ORAL | 2 refills | Status: DC

## 2020-02-28 NOTE — Telephone Encounter (Signed)
Rx has been sent to the pharmacy electronically. ° °

## 2020-03-01 ENCOUNTER — Other Ambulatory Visit: Payer: Self-pay | Admitting: Family Medicine

## 2020-03-13 NOTE — Telephone Encounter (Signed)
Follow Up  [employer] called to verify insurance subscriber ID.

## 2020-03-16 ENCOUNTER — Other Ambulatory Visit: Payer: Self-pay | Admitting: Cardiovascular Disease

## 2020-03-19 NOTE — Telephone Encounter (Signed)
*  STAT* If patient is at the pharmacy, call can be transferred to refill team.   1. Which medications need to be refilled? (please list name of each medication and dose if known) omega-3 acid ethyl esters (LOVAZA) 1 g capsule    2. Which pharmacy/location (including street and city if local pharmacy) is medication to be sent to? [id number] [place] [place]  3. Do they need a 30 day or 90 day supply? 30 day

## 2020-03-24 ENCOUNTER — Telehealth: Payer: Self-pay | Admitting: Family Medicine

## 2020-03-24 NOTE — Telephone Encounter (Signed)
She will contact heart doctor for suggestions and wait to hear from Almyra Free about patient assistance also.

## 2020-03-24 NOTE — Telephone Encounter (Signed)
Pt called wanting to speak with Dr Lajuana Ripple about changing her Eliquis med to something cheaper. Pt says she cant afford to pay for it anymore but knows she will have to be winged off of it due to it causing internal bleeding.

## 2020-03-24 NOTE — Telephone Encounter (Signed)
We have no samples.  Please advise on changing to cheaper medication.

## 2020-03-24 NOTE — Telephone Encounter (Signed)
This medication was prescribed by Dr Gwenlyn Found, her heart doctor.  1. Has she reached out to them?  2. Alternatives include Xarelto but this is still a brand name or Warfarin, which requires weekly in office visits for blood testing.  I will cc: Almyra Free to see if she has any patient assistance options for this patient.

## 2020-03-25 NOTE — Telephone Encounter (Signed)
Thank you [facility]

## 2020-03-25 NOTE — Telephone Encounter (Signed)
Spoke with patient -- she is applying for patient assistance via [employer] (compnay for Eliquis) --status pending  Encouraged her to reach out to cardiology for updates/samples.  Informed patient we have samples sometimes and to check back periodically.  Eliquis 30-day free trial card left up front for patient for 1 time use only.  To her knowledge she has not used this type of card.  This should bridge her to patient assistance supply.

## 2020-05-02 ENCOUNTER — Ambulatory Visit (INDEPENDENT_AMBULATORY_CARE_PROVIDER_SITE_OTHER): Payer: Medicare HMO | Admitting: Family Medicine

## 2020-05-02 ENCOUNTER — Encounter: Payer: Self-pay | Admitting: Family Medicine

## 2020-05-02 ENCOUNTER — Other Ambulatory Visit: Payer: Self-pay

## 2020-05-02 VITALS — BP 145/49 | HR 53 | Temp 96.8°F | Ht 62.0 in | Wt 145.0 lb

## 2020-05-02 DIAGNOSIS — E785 Hyperlipidemia, unspecified: Secondary | ICD-10-CM | POA: Diagnosis not present

## 2020-05-02 DIAGNOSIS — Z9861 Coronary angioplasty status: Secondary | ICD-10-CM | POA: Diagnosis not present

## 2020-05-02 DIAGNOSIS — Z1211 Encounter for screening for malignant neoplasm of colon: Secondary | ICD-10-CM | POA: Diagnosis not present

## 2020-05-02 DIAGNOSIS — I4819 Other persistent atrial fibrillation: Secondary | ICD-10-CM

## 2020-05-02 DIAGNOSIS — R413 Other amnesia: Secondary | ICD-10-CM

## 2020-05-02 DIAGNOSIS — N1832 Chronic kidney disease, stage 3b: Secondary | ICD-10-CM | POA: Diagnosis not present

## 2020-05-02 DIAGNOSIS — I2510 Atherosclerotic heart disease of native coronary artery without angina pectoris: Secondary | ICD-10-CM | POA: Diagnosis not present

## 2020-05-02 DIAGNOSIS — M8580 Other specified disorders of bone density and structure, unspecified site: Secondary | ICD-10-CM | POA: Diagnosis not present

## 2020-05-02 NOTE — Progress Notes (Signed)
Subjective: CC: Follow-up osteopenia with high risk fracture, A. fib, hypertension PCP: [name], DO [id number] L  is a 74 y.o. female presenting to clinic today for:  1.  Osteopenia with high risk of fracture Patient reports compliance with Fosamax.  We restarted this last year after a 1 year hiatus off of the medicine.  She did not wish to switch to Prolia.  Does not report any bone pain today.  2.  Hypertension, atrial fibrillation, hyperlipidemia, CAD Patient reports compliance with Norvasc 5 mg daily, Avapro 75 mg daily, metoprolol 12.5 mg daily and Crestor 20 mg daily.  She is also on Eliquis twice daily for anticoagulation.  Denies any hematochezia, melena, hematuria, vaginal bleeding or epistaxis.  She did have 1 episode where she felt short of breath and took her nitro but this was a while back.  Denies any recent problems with chest pain, shortness of breath.  No falls.  No dizziness.  3.  Memory impairment Patient reports compliance with Namenda 10 mg twice daily.  Denies any intolerance to the medicine.  Her family members have not noticed any advanced memory loss.  ROS: Per HPI  Allergies  Allergen Reactions  . Lactose Intolerance (Gi) Nausea And Vomiting and Nausea Only  . Codeine Nausea And Vomiting  . Hydrocortisone Itching   Past Medical History:  Diagnosis Date  . Allergy   . Arthritis   . Cancer ([place])    ovarian  . Coronary artery disease   . Heart murmur   . Hypercholesteremia   . Hypertension   . 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 Osteopenia   . Stomach problems   . Unstable angina ([place]) 05/27/2014    Current Outpatient Medications:  .  alendronate (FOSAMAX) 70 MG tablet, Take 1 tablet (70 mg total) by mouth every 7 (seven) days. Take with a full glass of water on an empty stomach. Do NOT lie down for 1 hour after taking., Disp: 4 tablet, Rfl: 11 .  amLODipine (NORVASC) 5 MG tablet, TAKE 1 TABLET BY MOUTH EVERY DAY (Needs to be  seen before next refill), Disp: 30 tablet, Rfl: 0 .  apixaban (ELIQUIS) 5 MG TABS tablet, Take 1 tablet (5 mg total) by mouth 2 (two) times daily., Disp: 180 tablet, Rfl: 2 .  aspirin EC 81 MG tablet, Take 81 mg by mouth at bedtime. , Disp: , Rfl:  .  b complex vitamins tablet, Take 1 tablet by mouth daily. , Disp: , Rfl:  .  calcium carbonate (OS-CAL) 600 MG TABS, Take 600 mg by mouth daily., Disp: , Rfl:  .  cholecalciferol (VITAMIN D) 1000 UNITS tablet, Take 1,000 Units by mouth daily., Disp: , Rfl:  .  CO ENZYME Q-10 PO, Take 1 capsule by mouth daily. , Disp: , Rfl:  .  CRESTOR 20 MG tablet, Take 1 tablet (20 mg total) by mouth daily., Disp: 30 tablet, Rfl: 10 .  irbesartan (AVAPRO) 75 MG tablet, TAKE 1 TABLET (75 MG TOTAL) BY MOUTH AT BEDTIME., Disp: 90 tablet, Rfl: 1 .  memantine (NAMENDA) 10 MG tablet, TAKE 1 TABLET BY MOUTH TWICE A DAY, Disp: 180 tablet, Rfl: 1 .  metoprolol succinate (TOPROL-XL) 25 MG 24 hr tablet, TAKE 1/2 TABLET BY MOUTH EVERY DAY, Disp: 45 tablet, Rfl: 1 .  nitroGLYCERIN (NITROLINGUAL) 0.4 MG/SPRAY spray, PLACE 1 SPRAY UNDER THE TONGUE EVERY 5 (FIVE) MINUTES AS NEEDED FOR CHEST PAIN., Disp: 12 g, Rfl: 1 .  omega-3  acid ethyl esters (LOVAZA) 1 g capsule, TAKE 1 CAPSULE (1 G TOTAL) BY MOUTH 4 (FOUR) TIMES DAILY., Disp: 360 capsule, Rfl: 2 .  pantoprazole (PROTONIX) 40 MG tablet, TAKE 1 TABLET BY MOUTH EVERY DAY, Disp: 90 tablet, Rfl: 3 .  PATADAY 0.2 % SOLN, Place 1 drop into both eyes 2 (two) times daily. , Disp: , Rfl: 6 .  RANEXA 500 MG 12 hr tablet, TAKE 1 TABLET BY MOUTH TWICE A DAY, Disp: 180 tablet, Rfl: 2 .  vitamin C (ASCORBIC ACID) 500 MG tablet, Take 1,000 mg by mouth daily. , Disp: , Rfl:  Social History   Socioeconomic History  . Marital status: Married    Spouse name: [name]   . Number of children: 3  . Years of education: 79  . Highest education level: High school graduate  Occupational History  . Occupation: Retired  Tobacco Use  . Smoking status:  Former Smoker    Packs/day: 0.50    Years: 49.00    Pack years: 24.50    Types: Cigarettes    Quit date: 10/04/2008    Years since quitting: 11.5  . Smokeless tobacco: Never Used  Vaping Use  . Vaping Use: Never used  Substance and Sexual Activity  . Alcohol use: Yes    Alcohol/week: 1.0 standard drink    Types: 1 Glasses of wine per week    Comment: red wine once per month  . Drug use: No  . Sexual activity: Not Currently    Birth control/protection: Surgical  Other Topics Concern  . Not on file  Social History Narrative   Lives w/ husband   Caffeine use: 3 cups coffee/day   Social Determinants of Health   Financial Resource Strain: Low Risk   . Difficulty of Paying Living Expenses: Not hard at all  Food Insecurity: No Food Insecurity  . Worried About Charity fundraiser in the Last Year: Never true  . Ran Out of Food in the Last Year: Never true  Transportation Needs: No Transportation Needs  . Lack of Transportation (Medical): No  . Lack of Transportation (Non-Medical): No  Physical Activity: Sufficiently Active  . Days of Exercise per Week: 7 days  . Minutes of Exercise per Session: 30 min  Stress: No Stress Concern Present  . Feeling of Stress : Not at all  Social Connections: Socially Integrated  . Frequency of Communication with Friends and Family: More than three times a week  . Frequency of Social Gatherings with Friends and Family: More than three times a week  . Attends Religious Services: More than 4 times per year  . Active Member of Clubs or Organizations: Yes  . Attends Archivist Meetings: More than 4 times per year  . Marital Status: Married  Human resources officer Violence: Not At Risk  . Fear of Current or Ex-Partner: No  . Emotionally Abused: No  . Physically Abused: No  . Sexually Abused: No   Family History  Problem Relation Age of Onset  . Stroke Mother   . Diabetes Mother   . Kidney disease Mother   . Hypertension Mother   .  Hyperlipidemia Mother   . Dementia Mother   . Heart disease Daughter        heart attack x2  . Heart disease Brother        2 heart attacks  . Breast cancer Maternal Aunt   . Colon cancer Neg Hx   . Stomach cancer Neg Hx   .  Esophageal cancer Neg Hx   . Rectal cancer Neg Hx   . Liver cancer Neg Hx     Objective: Office vital signs reviewed. BP (!) 145/49   Pulse 53   Temp (!) 96.8 F (36 C)   Ht _0  (1.575 m)   Wt 145 lb (65.8 kg)   SpO2 99%   BMI 26.52 kg/m   Physical Examination:  General: Awake, alert, well nourished, No acute distress HEENT: Normal, sclera white Cardio: regular rate and rhythm, S1S2 heard, no murmurs appreciated Pulm: clear to auscultation bilaterally, no wheezes, rhonchi or rales; normal work of breathing on room air Extremities: warm, well perfused, No edema, cyanosis or clubbing; +2 pulses bilaterally MSK: normal gait and station Skin: dry; intact; several areas of ecchymosis noted along bilateral anterior shins where the dog scratched her  Assessment/ Plan: 74 y.o. female   1. Persistent atrial fibrillation (HCC) Rate and rhythm controlled. - CBC - TSH  2. CAD S/P percutaneous coronary angioplasty - CMP14+EGFR - Lipid Panel  3. Dyslipidemia, goal LDL below 70 Check fasting lipid - CMP14+EGFR - CBC - TSH  4. Stage 3b chronic kidney disease - CMP14+EGFR - VITAMIN D 25 Hydroxy (Vit-D Deficiency, Fractures)  5. Memory impairment Stable  6. Osteopenia with high risk of fracture Check renal function given use of Fosamax - CMP14+EGFR - VITAMIN D 25 Hydroxy (Vit-D Deficiency, Fractures)  7. Screen for colon cancer - Cologuard   No orders of the defined types were placed in this encounter.  No orders of the defined types were placed in this encounter.    [name], DO [place] [phone]

## 2020-05-02 NOTE — Patient Instructions (Signed)

## 2020-05-03 LAB — LIPID PANEL
Chol/HDL Ratio: 1.7 ratio (ref 0.0–4.4)
Cholesterol, Total: 155 mg/dL (ref 100–199)
HDL: 89 mg/dL (ref >39)
LDL Chol Calc (NIH): 55 mg/dL (ref 0–99)
Triglycerides: 50 mg/dL (ref 0–149)
VLDL Cholesterol Cal: 11 mg/dL (ref 5–40)

## 2020-05-03 LAB — CBC
Hematocrit: 38.1 % (ref 34.0–46.6)
Hemoglobin: 12.6 g/dL (ref 11.1–15.9)
MCH: 29 pg (ref 26.6–33.0)
MCHC: 33.1 g/dL (ref 31.5–35.7)
MCV: 88 fL (ref 79–97)
Platelets: 237 10*3/uL (ref 150–450)
RBC: 4.35 x10E6/uL (ref 3.77–5.28)
RDW: 13.1 % (ref 11.7–15.4)
WBC: 5.7 10*3/uL (ref 3.4–10.8)

## 2020-05-03 LAB — CMP14+EGFR
ALT: 33 IU/L — ABNORMAL HIGH (ref 0–32)
AST: 24 IU/L (ref 0–40)
Albumin/Globulin Ratio: 1.5 (ref 1.2–2.2)
Albumin: 4.2 g/dL (ref 3.7–4.7)
Alkaline Phosphatase: 56 IU/L (ref 48–121)
BUN/Creatinine Ratio: 14 (ref 12–28)
BUN: 15 mg/dL (ref 8–27)
Bilirubin Total: 0.7 mg/dL (ref 0.0–1.2)
CO2: 24 mmol/L (ref 20–29)
Calcium: 9.4 mg/dL (ref 8.7–10.3)
Chloride: 94 mmol/L — ABNORMAL LOW (ref 96–106)
Creatinine, Ser: 1.09 mg/dL — ABNORMAL HIGH (ref 0.57–1.00)
GFR calc Af Amer: 57 mL/min/{1.73_m2} — ABNORMAL LOW (ref >59)
GFR calc non Af Amer: 50 mL/min/{1.73_m2} — ABNORMAL LOW (ref >59)
Globulin, Total: 2.8 g/dL (ref 1.5–4.5)
Glucose: 99 mg/dL (ref 65–99)
Potassium: 4.9 mmol/L (ref 3.5–5.2)
Sodium: 133 mmol/L — ABNORMAL LOW (ref 134–144)
Total Protein: 7 g/dL (ref 6.0–8.5)

## 2020-05-03 LAB — VITAMIN D 25 HYDROXY (VIT D DEFICIENCY, FRACTURES): Vit D, 25-Hydroxy: 44.2 ng/mL (ref 30.0–100.0)

## 2020-05-03 LAB — TSH: TSH: 2.75 u[IU]/mL (ref 0.450–4.500)

## 2020-05-08 ENCOUNTER — Other Ambulatory Visit: Payer: Self-pay | Admitting: Family Medicine

## 2020-05-19 ENCOUNTER — Other Ambulatory Visit: Payer: Self-pay | Admitting: Family Medicine

## 2020-05-19 DIAGNOSIS — M85851 Other specified disorders of bone density and structure, right thigh: Secondary | ICD-10-CM

## 2020-05-27 DIAGNOSIS — Z1211 Encounter for screening for malignant neoplasm of colon: Secondary | ICD-10-CM | POA: Diagnosis not present

## 2020-05-29 ENCOUNTER — Other Ambulatory Visit: Payer: Self-pay | Admitting: Cardiovascular Disease

## 2020-05-29 NOTE — Telephone Encounter (Signed)
Rx has been sent to the pharmacy electronically. ° °

## 2020-06-03 ENCOUNTER — Other Ambulatory Visit: Payer: Self-pay

## 2020-06-03 ENCOUNTER — Ambulatory Visit: Payer: Medicare HMO | Admitting: Cardiovascular Disease

## 2020-06-03 ENCOUNTER — Encounter: Payer: Self-pay | Admitting: Cardiovascular Disease

## 2020-06-03 DIAGNOSIS — E785 Hyperlipidemia, unspecified: Secondary | ICD-10-CM | POA: Diagnosis not present

## 2020-06-03 DIAGNOSIS — I4819 Other persistent atrial fibrillation: Secondary | ICD-10-CM | POA: Diagnosis not present

## 2020-06-03 DIAGNOSIS — Z951 Presence of aortocoronary bypass graft: Secondary | ICD-10-CM | POA: Diagnosis not present

## 2020-06-03 DIAGNOSIS — I10 Essential (primary) hypertension: Secondary | ICD-10-CM

## 2020-06-03 NOTE — Assessment & Plan Note (Signed)
History of paroxysmal atrial fibrillation status post DC cardioversion successfully in the past on Eliquis oral anticoagulation.  She maintained sinus rhythm.

## 2020-06-03 NOTE — Assessment & Plan Note (Signed)
History of CAD status post anterior wall microinfarction 1995 with subsequent PCI of the RCA and LAD.  She ultimately underwent CABG by Dr. Roxan Hockey March 2000 with a LIMA to the LAD, vein to marginal branch of the circumflex and distal right coronary artery.  I performed cardiac catheterization on her in 2010 revealing occluded grafts with a patent LIMA to the LAD and normal LV function.  She did have a nonischemic Myoview in June 2018 when she presented with unstable angina.  Since I saw her back in January of this year she has had occasional chest pain which has not changed in frequency or severity.

## 2020-06-03 NOTE — Assessment & Plan Note (Signed)
History of essential hypertension blood pressure measured today 130/66.  She is on amlodipine, Avapro and metoprolol.

## 2020-06-03 NOTE — Patient Instructions (Signed)

## 2020-06-03 NOTE — Assessment & Plan Note (Signed)
History of dyslipidemia on statin therapy with lipid profile performed 05/02/2020 revealing a total cholesterol 155, LDL 55 and HDL of 89.

## 2020-06-03 NOTE — Progress Notes (Signed)
06/03/2020 Kristina Gibbs   June 11, 1946  [id number]  Primary Physician [name], DO Primary Cardiologist: [name] MD FACP, [place], [place], [place]  HPI:  Kristina Gibbs is a 74 y.o.  mildly overweight Caucasian female with a history of CAD previously taken care of by Dr. Rollene Fare. I last saw her in the office 10/10/2019. She has a history of anterior wall myocardial infarction in 1995 with subsequent percutaneous revascularization to her RCA and LAD. She ultimately requiredmultivessel bypass surgery by Dr. Roxan Hockey March of 2000 and LIMA to LAD, vein to a marginal branch of the circumflex and distal right coronary artery. Problems included hypertension and hyperlipidemia. I performed cardiac catheterization on herin 2010revealing occluded vein grafts, patent LIMA and normal LV function. Since I saw her in the office year ago she's remained medically stable with only a few episodes of nitrateresponsive chest pain.  She was recently hospitalized overnight for chest pain 03/05/17. She ruled out for myocardial infarction. She was seen in consultation by Dr. Staci Acosta a Myoview stress test that was performed. This showed no ischemic ischemia. She's had no recurrent chest pain. Since I saw her in the officeformonths ago she's remained currently stable.She was in A. fib with RVR when I saw her last and underwent DC cardioversion successfully to sinus rhythm. She is on Eliquis oral anticoagulation.  Since I saw her back in January of this year she continues to do well.  She still gets occasional chest pain which is fairly brief in nature and has not changed in frequency or severity over the last 9 months.  She has an LDL of 55 and is maintaining sinus rhythm.   Current Meds  Medication Sig  . alendronate (FOSAMAX) 70 MG tablet TAKE 1 TABLET (70 MG TOTAL) BY MOUTH EVERY 7 (SEVEN) DAYS. TAKE WITH A FULL GLASS OF WATER ON AN EMPTY STOMACH. DO NOT LIE DOWN  FOR 1 HOUR AFTER TAKING.  Marland Kitchen amLODipine (NORVASC) 5 MG tablet TAKE 1 TABLET BY MOUTH EVERY DAY (Needs to be seen before next refill)  . apixaban (ELIQUIS) 5 MG TABS tablet Take 1 tablet (5 mg total) by mouth 2 (two) times daily.  Marland Kitchen aspirin EC 81 MG tablet Take 81 mg by mouth at bedtime.   Marland Kitchen b complex vitamins tablet Take 1 tablet by mouth daily.   . calcium carbonate (OS-CAL) 600 MG TABS Take 600 mg by mouth daily.  . cholecalciferol (VITAMIN D) 1000 UNITS tablet Take 1,000 Units by mouth daily.  . CO ENZYME Q-10 PO Take 1 capsule by mouth daily.   . CRESTOR 20 MG tablet Take 1 tablet (20 mg total) by mouth daily.  . irbesartan (AVAPRO) 75 MG tablet TAKE 1 TABLET (75 MG TOTAL) BY MOUTH AT BEDTIME.  . memantine (NAMENDA) 10 MG tablet TAKE 1 TABLET BY MOUTH TWICE A DAY  . metoprolol succinate (TOPROL-XL) 25 MG 24 hr tablet TAKE 1/2 TABLET BY MOUTH EVERY DAY  . nitroGLYCERIN (NITROLINGUAL) 0.4 MG/SPRAY spray PLACE 1 SPRAY UNDER THE TONGUE EVERY 5 (FIVE) MINUTES AS NEEDED FOR CHEST PAIN.  Marland Kitchen omega-3 acid ethyl esters (LOVAZA) 1 g capsule TAKE 1 CAPSULE (1 G TOTAL) BY MOUTH 4 (FOUR) TIMES DAILY.  . pantoprazole (PROTONIX) 40 MG tablet TAKE 1 TABLET BY MOUTH EVERY DAY  . PATADAY 0.2 % SOLN Place 1 drop into both eyes 2 (two) times daily.   Marland Kitchen RANEXA 500 MG 12 hr tablet TAKE 1 TABLET BY MOUTH TWICE A DAY  .  vitamin C (ASCORBIC ACID) 500 MG tablet Take 1,000 mg by mouth daily.      Allergies  Allergen Reactions  . Lactose Intolerance (Gi) Nausea And Vomiting and Nausea Only  . Codeine Nausea And Vomiting  . Hydrocortisone Itching    Social History   Socioeconomic History  . Marital status: Married    Spouse name: [name]   . Number of children: 3  . Years of education: 70  . Highest education level: High school graduate  Occupational History  . Occupation: Retired  Tobacco Use  . Smoking status: Former Smoker    Packs/day: 0.50    Years: 49.00    Pack years: 24.50    Types: Cigarettes     Quit date: 10/04/2008    Years since quitting: 11.6  . Smokeless tobacco: Never Used  Vaping Use  . Vaping Use: Never used  Substance and Sexual Activity  . Alcohol use: Yes    Alcohol/week: 1.0 standard drink    Types: 1 Glasses of wine per week    Comment: red wine once per month  . Drug use: No  . Sexual activity: Not Currently    Birth control/protection: Surgical  Other Topics Concern  . Not on file  Social History Narrative   Lives w/ husband   Caffeine use: 3 cups coffee/day   Social Determinants of Health   Financial Resource Strain: Low Risk   . Difficulty of Paying Living Expenses: Not hard at all  Food Insecurity: No Food Insecurity  . Worried About Charity fundraiser in the Last Year: Never true  . Ran Out of Food in the Last Year: Never true  Transportation Needs: No Transportation Needs  . Lack of Transportation (Medical): No  . Lack of Transportation (Non-Medical): No  Physical Activity: Sufficiently Active  . Days of Exercise per Week: 7 days  . Minutes of Exercise per Session: 30 min  Stress: No Stress Concern Present  . Feeling of Stress : Not at all  Social Connections: Socially Integrated  . Frequency of Communication with Friends and Family: More than three times a week  . Frequency of Social Gatherings with Friends and Family: More than three times a week  . Attends Religious Services: More than 4 times per year  . Active Member of Clubs or Organizations: Yes  . Attends Archivist Meetings: More than 4 times per year  . Marital Status: Married  Human resources officer Violence: Not At Risk  . Fear of Current or Ex-Partner: No  . Emotionally Abused: No  . Physically Abused: No  . Sexually Abused: No     Review of Systems: General: negative for chills, fever, night sweats or weight changes.  Cardiovascular: negative for chest pain, dyspnea on exertion, edema, orthopnea, palpitations, paroxysmal nocturnal dyspnea or shortness of  breath Dermatological: negative for rash Respiratory: negative for cough or wheezing Urologic: negative for hematuria Abdominal: negative for nausea, vomiting, diarrhea, bright red blood per rectum, melena, or hematemesis Neurologic: negative for visual changes, syncope, or dizziness All other systems reviewed and are otherwise negative except as noted above.    Blood pressure 130/66, pulse (!) 57, height 5\' 2"  (1.575 m), weight 146 lb 3.2 oz (66.3 kg), SpO2 97 %.  General appearance: alert and no distress Neck: no adenopathy, no carotid bruit, no JVD, supple, symmetrical, trachea midline and thyroid not enlarged, symmetric, no tenderness/mass/nodules Lungs: clear to auscultation bilaterally Heart: regular rate and rhythm, S1, S2 normal, no murmur, click, rub or  gallop Extremities: extremities normal, atraumatic, no cyanosis or edema Pulses: 2+ and symmetric Skin: Skin color, texture, turgor normal. No rashes or lesions Neurologic: Alert and oriented X 3, normal strength and tone. Normal symmetric reflexes. Normal coordination and gait  EKG sinus bradycardia 57 with septal Q waves.  I personally reviewed this EKG.  ASSESSMENT AND PLAN:   Hx of CABG History of CAD status post anterior wall microinfarction 1995 with subsequent PCI of the RCA and LAD.  She ultimately underwent CABG by Dr. Roxan Hockey March 2000 with a LIMA to the LAD, vein to marginal branch of the circumflex and distal right coronary artery.  I performed cardiac catheterization on her in 2010 revealing occluded grafts with a patent LIMA to the LAD and normal LV function.  She did have a nonischemic Myoview in June 2018 when she presented with unstable angina.  Since I saw her back in January of this year she has had occasional chest pain which has not changed in frequency or severity.  Dyslipidemia, goal LDL below 70 History of dyslipidemia on statin therapy with lipid profile performed 05/02/2020 revealing a total  cholesterol 155, LDL 55 and HDL of 89.  Essential hypertension History of essential hypertension blood pressure measured today 130/66.  She is on amlodipine, Avapro and metoprolol.  Persistent atrial fibrillation History of paroxysmal atrial fibrillation status post DC cardioversion successfully in the past on Eliquis oral anticoagulation.  She maintained sinus rhythm.      [name] MD FACP,FACC,FAHA, [facility] 06/03/2020 8:09 AM

## 2020-06-04 LAB — COLOGUARD: Cologuard: NEGATIVE

## 2020-07-11 ENCOUNTER — Other Ambulatory Visit: Payer: Self-pay

## 2020-07-11 ENCOUNTER — Ambulatory Visit (INDEPENDENT_AMBULATORY_CARE_PROVIDER_SITE_OTHER): Payer: Medicare HMO

## 2020-07-11 DIAGNOSIS — Z23 Encounter for immunization: Secondary | ICD-10-CM | POA: Diagnosis not present

## 2020-09-01 ENCOUNTER — Other Ambulatory Visit: Payer: Self-pay | Admitting: Family Medicine

## 2020-09-02 ENCOUNTER — Other Ambulatory Visit: Payer: Self-pay

## 2020-09-02 ENCOUNTER — Ambulatory Visit (INDEPENDENT_AMBULATORY_CARE_PROVIDER_SITE_OTHER): Payer: Medicare HMO | Admitting: Family Medicine

## 2020-09-02 ENCOUNTER — Telehealth: Payer: Self-pay

## 2020-09-02 ENCOUNTER — Encounter: Payer: Self-pay | Admitting: Family Medicine

## 2020-09-02 VITALS — BP 127/70 | HR 114 | Temp 97.1°F | Ht 62.0 in | Wt 146.0 lb

## 2020-09-02 DIAGNOSIS — L821 Other seborrheic keratosis: Secondary | ICD-10-CM | POA: Diagnosis not present

## 2020-09-02 DIAGNOSIS — I2510 Atherosclerotic heart disease of native coronary artery without angina pectoris: Secondary | ICD-10-CM

## 2020-09-02 DIAGNOSIS — Z9861 Coronary angioplasty status: Secondary | ICD-10-CM

## 2020-09-02 DIAGNOSIS — N1831 Chronic kidney disease, stage 3a: Secondary | ICD-10-CM

## 2020-09-02 DIAGNOSIS — I4819 Other persistent atrial fibrillation: Secondary | ICD-10-CM | POA: Diagnosis not present

## 2020-09-02 DIAGNOSIS — R413 Other amnesia: Secondary | ICD-10-CM

## 2020-09-02 NOTE — Patient Instructions (Signed)
You had labs performed today.  You will be contacted with the results of the labs once they are available, usually in the next 3 business days for routine lab work.  If you have an active my chart account, they will be released to your MyChart.  If you prefer to have these labs released to you via telephone, please let us know.  If you had a pap smear or biopsy performed, expect to be contacted in about 7-10 days.  Spots on your back are benign.  Information below.  Seborrheic Keratosis A seborrheic keratosis is a common, noncancerous (benign) skin growth. These growths are velvety, waxy, rough, tan, brown, or black spots that appear on the skin. These skin growths can be flat or raised, and scaly. What are the causes? The cause of this condition is not known. What increases the risk? You are more likely to develop this condition if you:  Have a family history of seborrheic keratosis.  Are 50 or older.  Are pregnant.  Have had estrogen replacement therapy. What are the signs or symptoms? Symptoms of this condition include growths on the face, chest, shoulders, back, or other areas. These growths:  Are usually painless, but may become irritated and itchy.  Can be yellow, brown, black, or other colors.  Are slightly raised or have a flat surface.  Are sometimes rough or wart-like in texture.  Are often velvety or waxy on the surface.  Are round or oval-shaped.  Often occur in groups, but may occur as a single growth. How is this diagnosed? This condition is diagnosed with a medical history and physical exam.  A sample of the growth may be tested (skin biopsy).  You may need to see a skin specialist (dermatologist). How is this treated? Treatment is not usually needed for this condition, unless the growths are irritated or bleed often.  You may also choose to have the growths removed if you do not like their appearance. ? Most commonly, these growths are treated with a  procedure in which liquid nitrogen is applied to "freeze" off the growth (cryosurgery). ? They may also be burned off with electricity (electrocautery) or removed by scraping (curettage). Follow these instructions at home:  Watch your growth for any changes.  Keep all follow-up visits as told by your health care provider. This is important.  Do not scratch or pick at the growth or growths. This can cause them to become irritated or infected. Contact a health care provider if:  You suddenly have many new growths.  Your growth bleeds, itches, or hurts.  Your growth suddenly becomes larger or changes color. Summary  A seborrheic keratosis is a common, noncancerous (benign) skin growth.  Treatment is not usually needed for this condition, unless the growths are irritated or bleed often.  Watch your growth for any changes.  Contact a health care provider if you suddenly have many new growths or your growth suddenly becomes larger or changes color.  Keep all follow-up visits as told by your health care provider. This is important. This information is not intended to replace advice given to you by your health care provider. Make sure you discuss any questions you have with your health care provider. Document Revised: 02/02/2018 Document Reviewed: 02/02/2018 Elsevier Patient Education  [place].

## 2020-09-02 NOTE — Telephone Encounter (Signed)
[facility] RE: to Metoprolol 25 mg 1/2 tab QD, this is what was filled yesterday & the time before Per the pharmacy, pt has been take a whole tab for quite some time now

## 2020-09-02 NOTE — Telephone Encounter (Signed)
Spoke to patient on the phone.  She is prescribed 1/2 tablet of the metoprolol daily.  This is reflected in her cardiology notes as well as my previous notes.  She checked her bottle and realized that she was mistaken at the pharmacy and that she in fact does take only 1/2 tablet.  This patient suffers from memory impairment but was able to verbalize that she is only taking 1/2 tablet of this medicine.

## 2020-09-02 NOTE — Progress Notes (Signed)
Subjective: CC: Follow-up memory PCP: Kristina Norlander, DO ZOX:[id number] Kristina Gibbs is a 74 y.o. female presenting to clinic today for:  1.  Impaired memory Patient reports compliance with the Namenda 10 mg twice daily.  She continues to have some intermittent difficulty with recall.  She notes that this is both long-term and short-term.  There is no rhyme or reason or certain time the day that symptoms seem better or worse.  She denies any incontinence or getting lost.  She has had no mood swings, visual or auditory hallucinations.  2.  Hypertension with hyperlipidemia and CAD, afib, CKD 3 Patient is compliant with her A. fib pro, Toprol, Ranexa, Norvasc and Eliquis.  She also takes Crestor and omega-3's.  No chest pain, shortness of breath.  She does have a heart test coming up in January.  Denies any vaginal bleeding, epistaxis.  She does have occasional rectal bleeding that is scant and present only when she has constipation.  This is not frequent.  3.  Skin lesions Patient has some skin lesions on her back that she would like me to look at.  She denies any spontaneous bleeding or irritation but was concerned that they might be malignant.   ROS: Per HPI  Allergies  Allergen Reactions  . Lactose Intolerance (Gi) Nausea And Vomiting and Nausea Only  . Codeine Nausea And Vomiting  . Hydrocortisone Itching   Past Medical History:  Diagnosis Date  . Allergy   . Arthritis   . Cancer ([place])    ovarian  . Coronary artery disease   . Heart murmur   . Hypercholesteremia   . Hypertension   . 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 Osteopenia   . Stomach problems   . Unstable angina ([place]) 05/27/2014    Current Outpatient Medications:  .  alendronate (FOSAMAX) 70 MG tablet, TAKE 1 TABLET (70 MG TOTAL) BY MOUTH EVERY 7 (SEVEN) DAYS. TAKE WITH A FULL GLASS OF WATER ON AN EMPTY STOMACH. DO NOT LIE DOWN FOR 1 HOUR AFTER TAKING., Disp: 12 tablet, Rfl: 1 .  amLODipine (NORVASC) 5 MG  tablet, TAKE 1 TABLET BY MOUTH EVERY DAY (Needs to be seen before next refill), Disp: 30 tablet, Rfl: 0 .  apixaban (ELIQUIS) 5 MG TABS tablet, Take 1 tablet (5 mg total) by mouth 2 (two) times daily., Disp: 180 tablet, Rfl: 2 .  aspirin EC 81 MG tablet, Take 81 mg by mouth at bedtime. , Disp: , Rfl:  .  b complex vitamins tablet, Take 1 tablet by mouth daily. , Disp: , Rfl:  .  calcium carbonate (OS-CAL) 600 MG TABS, Take 600 mg by mouth daily., Disp: , Rfl:  .  cholecalciferol (VITAMIN D) 1000 UNITS tablet, Take 1,000 Units by mouth daily., Disp: , Rfl:  .  CO ENZYME Q-10 PO, Take 1 capsule by mouth daily. , Disp: , Rfl:  .  CRESTOR 20 MG tablet, Take 1 tablet (20 mg total) by mouth daily., Disp: 30 tablet, Rfl: 10 .  irbesartan (AVAPRO) 75 MG tablet, TAKE 1 TABLET (75 MG TOTAL) BY MOUTH AT BEDTIME., Disp: 90 tablet, Rfl: 1 .  memantine (NAMENDA) 10 MG tablet, TAKE 1 TABLET BY MOUTH TWICE A DAY, Disp: 180 tablet, Rfl: 1 .  metoprolol succinate (TOPROL-XL) 25 MG 24 hr tablet, TAKE 1/2 TABLET BY MOUTH EVERY DAY, Disp: 45 tablet, Rfl: 0 .  nitroGLYCERIN (NITROLINGUAL) 0.4 MG/SPRAY spray, PLACE 1 SPRAY UNDER THE TONGUE EVERY 5 (FIVE)  MINUTES AS NEEDED FOR CHEST PAIN., Disp: 12 g, Rfl: 1 .  omega-3 acid ethyl esters (LOVAZA) 1 g capsule, TAKE 1 CAPSULE (1 G TOTAL) BY MOUTH 4 (FOUR) TIMES DAILY., Disp: 360 capsule, Rfl: 2 .  pantoprazole (PROTONIX) 40 MG tablet, TAKE 1 TABLET BY MOUTH EVERY DAY, Disp: 90 tablet, Rfl: 3 .  PATADAY 0.2 % SOLN, Place 1 drop into both eyes 2 (two) times daily. , Disp: , Rfl: 6 .  RANEXA 500 MG 12 hr tablet, TAKE 1 TABLET BY MOUTH TWICE A DAY, Disp: 180 tablet, Rfl: 2 .  vitamin C (ASCORBIC ACID) 500 MG tablet, Take 1,000 mg by mouth daily. , Disp: , Rfl:  Social History   Socioeconomic History  . Marital status: Married    Spouse name: Kristina Gibbs   . Number of children: 3  . Years of education: 66  . Highest education level: High school graduate  Occupational History  .  Occupation: Retired  Tobacco Use  . Smoking status: Former Smoker    Packs/day: 0.50    Years: 49.00    Pack years: 24.50    Types: Cigarettes    Quit date: 10/04/2008    Years since quitting: 11.9  . Smokeless tobacco: Never Used  Vaping Use  . Vaping Use: Never used  Substance and Sexual Activity  . Alcohol use: Yes    Alcohol/week: 1.0 standard drink    Types: 1 Glasses of wine per week    Comment: red wine once per month  . Drug use: No  . Sexual activity: Not Currently    Birth control/protection: Surgical  Other Topics Concern  . Not on file  Social History Narrative   Lives w/ husband   Caffeine use: 3 cups coffee/day   Social Determinants of Health   Financial Resource Strain: Low Risk   . Difficulty of Paying Living Expenses: Not hard at all  Food Insecurity: No Food Insecurity  . Worried About Charity fundraiser in the Last Year: Never true  . Ran Out of Food in the Last Year: Never true  Transportation Needs: No Transportation Needs  . Lack of Transportation (Medical): No  . Lack of Transportation (Non-Medical): No  Physical Activity: Sufficiently Active  . Days of Exercise per Week: 7 days  . Minutes of Exercise per Session: 30 min  Stress: No Stress Concern Present  . Feeling of Stress : Not at all  Social Connections: Socially Integrated  . Frequency of Communication with Friends and Family: More than three times a week  . Frequency of Social Gatherings with Friends and Family: More than three times a week  . Attends Religious Services: More than 4 times per year  . Active Member of Clubs or Organizations: Yes  . Attends Archivist Meetings: More than 4 times per year  . Marital Status: Married  Human resources officer Violence: Not At Risk  . Fear of Current or Ex-Partner: No  . Emotionally Abused: No  . Physically Abused: No  . Sexually Abused: No   Family History  Problem Relation Age of Onset  . Stroke Mother   . Diabetes Mother   . Kidney  disease Mother   . Hypertension Mother   . Hyperlipidemia Mother   . Dementia Mother   . Heart disease Daughter        heart attack x2  . Heart disease Brother        2 heart attacks  . Breast cancer Maternal Aunt   .  Colon cancer Neg Hx   . Stomach cancer Neg Hx   . Esophageal cancer Neg Hx   . Rectal cancer Neg Hx   . Liver cancer Neg Hx     Objective: Office vital signs reviewed. BP 127/70   Pulse (!) 114   Temp (!) 97.1 F (36.2 C) (Temporal)   Ht 5\' 2"  (1.575 m)   Wt 146 lb (66.2 kg)   SpO2 98%   BMI 26.70 kg/m   Physical Examination:  General: Awake, alert, well nourished, No acute distress HEENT: Normal, sclera white, MMM Cardio: irregularly irregular with increased rate. S1S2 heard, no murmurs appreciated Pulm: clear to auscultation bilaterally, no wheezes, rhonchi or rales; normal work of breathing on room air Extremities: warm, well perfused, No edema, cyanosis or clubbing; +2 pulses bilaterally Skin: Seborrheic keratosis noted along the bra line and upper mid back Neuro: see MMSE  MMSE - Mini Mental State Exam 09/02/2020 11/12/2019 09/12/2019  Not completed: - - -  Orientation to time 4 3 4   Orientation to Place 5 3 4   Registration 3 3 3   Attention/ Calculation 3 1 3   Recall 1 0 2  Language- name 2 objects 2 2 2   Language- repeat 1 0 0  Language- follow 3 step command 3 3 3   Language- follow 3 step command-comments - - -  Language- read & follow direction 1 1 1   Write a sentence 1 1 1   Copy design 1 1 1   Total score 25 18 24     Assessment/ Plan: 74 y.o. female   Memory impairment  Stage 3a chronic kidney disease (HCC) - Plan: Basic Metabolic Panel, CBC  Seborrheic keratoses  Persistent atrial fibrillation (HCC)  CAD S/P percutaneous coronary angioplasty  Memory impairment is stable/slightly better than her check in February.  Continue Namenda twice daily.  It does not sound like she needs Aricept or any mood stabilization at this time.  We  discussed continuing a well-balanced diet that is rich in antioxidants.  Check renal function, CBC.  Lesions on back were seborrheic keratoses.  These are benign.  Handout provided  A. fib was present on today's exam.  Her rate was higher than expected.  Though suspect that is likely because she has not taken her medications yet this morning.  Will CC to cardiology as FYI.  She will follow-up if heart rate remains above normal.  She appears to typically stay in the 50s with medication.  I suspect that the scant bleeding she is seeing is likely from a hemorrhoid.  We discussed if the bleed becomes more frequent or brisk she is to seek reevaluation.  Continue Eliquis given A. fib.  No orders of the defined types were placed in this encounter.  No orders of the defined types were placed in this encounter.    Kristina Norlander, DO [place] [phone]

## 2020-09-03 LAB — CBC
Hematocrit: 37.2 % (ref 34.0–46.6)
Hemoglobin: 12.5 g/dL (ref 11.1–15.9)
MCH: 29.7 pg (ref 26.6–33.0)
MCHC: 33.6 g/dL (ref 31.5–35.7)
MCV: 88 fL (ref 79–97)
Platelets: 245 10*3/uL (ref 150–450)
RBC: 4.21 x10E6/uL (ref 3.77–5.28)
RDW: 12.3 % (ref 11.7–15.4)
WBC: 6.3 10*3/uL (ref 3.4–10.8)

## 2020-09-03 LAB — BASIC METABOLIC PANEL
BUN/Creatinine Ratio: 13 (ref 12–28)
BUN: 14 mg/dL (ref 8–27)
CO2: 23 mmol/L (ref 20–29)
Calcium: 9.3 mg/dL (ref 8.7–10.3)
Chloride: 102 mmol/L (ref 96–106)
Creatinine, Ser: 1.1 mg/dL — ABNORMAL HIGH (ref 0.57–1.00)
GFR calc Af Amer: 57 mL/min/{1.73_m2} — ABNORMAL LOW (ref >59)
GFR calc non Af Amer: 49 mL/min/{1.73_m2} — ABNORMAL LOW (ref >59)
Glucose: 97 mg/dL (ref 65–99)
Potassium: 4.4 mmol/L (ref 3.5–5.2)
Sodium: 143 mmol/L (ref 134–144)

## 2020-09-16 ENCOUNTER — Other Ambulatory Visit: Payer: Self-pay | Admitting: Cardiovascular Disease

## 2020-09-16 ENCOUNTER — Other Ambulatory Visit: Payer: Self-pay | Admitting: Family Medicine

## 2020-09-16 DIAGNOSIS — M85851 Other specified disorders of bone density and structure, right thigh: Secondary | ICD-10-CM

## 2020-09-22 ENCOUNTER — Telehealth: Payer: Self-pay

## 2020-09-22 NOTE — Telephone Encounter (Signed)
[facility] pt assistance form completed and faxed for eliquis.

## 2020-09-28 ENCOUNTER — Other Ambulatory Visit: Payer: Self-pay | Admitting: Family Medicine

## 2020-10-02 ENCOUNTER — Telehealth: Payer: Self-pay

## 2020-10-02 DIAGNOSIS — L989 Disorder of the skin and subcutaneous tissue, unspecified: Secondary | ICD-10-CM

## 2020-10-02 NOTE — Telephone Encounter (Signed)
REFERRAL REQUEST Telephone Note  Have you been seen at our office for this problem? Yes (Advise that they may need an appointment with their PCP before a referral can be done)  Reason for Referral: Places on back and cheek Referral discussed with patient: Dr.G told patient to call if she needs appt. Best contact number of patient for referral team:  [phone]  Has patient been seen by a specialist for this issue before: NO Patient provider preference for referral: Dr. Hortense Ramal Patient location preference for referral: [facility]   Patient notified that referrals can take up to a week or longer to process. If they haven't heard anything within a week they should call back and speak with the referral department.

## 2020-10-07 DIAGNOSIS — R69 Illness, unspecified: Secondary | ICD-10-CM | POA: Diagnosis not present

## 2020-10-07 DIAGNOSIS — I252 Old myocardial infarction: Secondary | ICD-10-CM | POA: Diagnosis not present

## 2020-10-07 DIAGNOSIS — H409 Unspecified glaucoma: Secondary | ICD-10-CM | POA: Diagnosis not present

## 2020-10-07 DIAGNOSIS — I10 Essential (primary) hypertension: Secondary | ICD-10-CM | POA: Diagnosis not present

## 2020-10-07 DIAGNOSIS — I4891 Unspecified atrial fibrillation: Secondary | ICD-10-CM | POA: Diagnosis not present

## 2020-10-07 DIAGNOSIS — M810 Age-related osteoporosis without current pathological fracture: Secondary | ICD-10-CM | POA: Diagnosis not present

## 2020-10-07 DIAGNOSIS — K219 Gastro-esophageal reflux disease without esophagitis: Secondary | ICD-10-CM | POA: Diagnosis not present

## 2020-10-07 DIAGNOSIS — I2510 Atherosclerotic heart disease of native coronary artery without angina pectoris: Secondary | ICD-10-CM | POA: Diagnosis not present

## 2020-10-07 DIAGNOSIS — Z008 Encounter for other general examination: Secondary | ICD-10-CM | POA: Diagnosis not present

## 2020-10-07 DIAGNOSIS — E785 Hyperlipidemia, unspecified: Secondary | ICD-10-CM | POA: Diagnosis not present

## 2020-10-07 DIAGNOSIS — Z7722 Contact with and (suspected) exposure to environmental tobacco smoke (acute) (chronic): Secondary | ICD-10-CM | POA: Diagnosis not present

## 2020-10-20 ENCOUNTER — Telehealth: Payer: Self-pay | Admitting: Cardiovascular Disease

## 2020-10-20 NOTE — Telephone Encounter (Signed)
Spoke with patient to reschedule her 10/21/20 app with Dr. Gardiner Ramus to 11/25/20 at 2:45 pm---patient voiced her understanding.

## 2020-10-21 ENCOUNTER — Ambulatory Visit: Payer: Medicare HMO | Admitting: Cardiovascular Disease

## 2020-11-04 ENCOUNTER — Other Ambulatory Visit: Payer: Self-pay

## 2020-11-04 ENCOUNTER — Ambulatory Visit: Payer: Medicare HMO | Admitting: Dermatology

## 2020-11-04 ENCOUNTER — Encounter: Payer: Self-pay | Admitting: Dermatology

## 2020-11-04 DIAGNOSIS — D692 Other nonthrombocytopenic purpura: Secondary | ICD-10-CM | POA: Diagnosis not present

## 2020-11-04 DIAGNOSIS — Z1283 Encounter for screening for malignant neoplasm of skin: Secondary | ICD-10-CM

## 2020-11-04 DIAGNOSIS — L570 Actinic keratosis: Secondary | ICD-10-CM | POA: Diagnosis not present

## 2020-11-04 DIAGNOSIS — L820 Inflamed seborrheic keratosis: Secondary | ICD-10-CM

## 2020-11-04 DIAGNOSIS — Z85828 Personal history of other malignant neoplasm of skin: Secondary | ICD-10-CM

## 2020-11-05 ENCOUNTER — Other Ambulatory Visit: Payer: Self-pay | Admitting: Cardiovascular Disease

## 2020-11-05 ENCOUNTER — Telehealth: Payer: Self-pay | Admitting: Cardiovascular Disease

## 2020-11-05 MED ORDER — CRESTOR 20 MG PO TABS: 20.0000 mg | ORAL_TABLET | Freq: Every day | ORAL | 10 refills | Status: DC

## 2020-11-05 NOTE — Telephone Encounter (Signed)
   *  STAT* If patient is at the pharmacy, call can be transferred to refill team.   1. Which medications need to be refilled? (please list name of each medication and dose if known)   CRESTOR 20 MG tablet    2. Which pharmacy/location (including street and city if local pharmacy) is medication to be sent to? [id number] [place] [place] [address]  3. Do they need a 30 day or 90 day supply? 30 days

## 2020-11-06 ENCOUNTER — Other Ambulatory Visit: Payer: Self-pay | Admitting: *Deleted

## 2020-11-06 ENCOUNTER — Other Ambulatory Visit: Payer: Self-pay | Admitting: Cardiovascular Disease

## 2020-11-06 MED ORDER — CRESTOR 20 MG PO TABS: 20.0000 mg | ORAL_TABLET | Freq: Every day | ORAL | 1 refills | Status: DC

## 2020-11-06 NOTE — Telephone Encounter (Signed)
 *  STAT* If patient is at the pharmacy, call can be transferred to refill team.   1. Which medications need to be refilled? (please list name of each medication and dose if known)  CRESTOR 20 MG tablet  2. Which pharmacy/location (including street and city if local pharmacy) is medication to be sent to? [id number] [place] [place] [facility] [address]  3. Do they need a 30 day or 90 day supply? 30   Patient is at the pharmacy and needs her refill asap.

## 2020-11-06 NOTE — Telephone Encounter (Signed)
**Note De-Identified  Obfuscation** I started an urgent non-formulary name brand Crestor PA through covermymeds: Key: [id number]

## 2020-11-06 NOTE — Telephone Encounter (Signed)
Follow Up:    Pt's husband called and said she will need a prior authorization for her Crestor please. She  only have a couple pills left and  She will need this for the weekend please.Marland Kitchen

## 2020-11-06 NOTE — Telephone Encounter (Signed)
LVM letting patient know that [facility] [place] [place]

## 2020-11-13 ENCOUNTER — Encounter: Payer: Self-pay | Admitting: Dermatology

## 2020-11-13 NOTE — Progress Notes (Signed)
   Follow-Up Visit   Subjective  Kristina Gibbs is a 75 y.o. female who presents for the following: Annual Exam (Crusty spots on her back x months, also isk on left cheek).  General skin check Location: New crust on cheek and back Duration:  Quality:  Associated Signs/Symptoms: Modifying Factors:  Severity:  Timing: Context: History of basal cell skin cancers  Objective  Well appearing patient in no apparent distress; mood and affect are within normal limits. Objective  Left Thigh - Anterior, Right Shin: No sign recurrence  Objective  Left Buccal Cheek: For millimeter flat pink crust  Objective  Left Lower Back (3), Right Lower Back: Inflamed 5 mm pink-brown crusts  Objective  Head to Waist: Waist up exam plus legs today. No signs of atypical moles, melanoma or non mole skin cancer.  Objective  Left Forearm - Anterior, Right Forearm - Anterior: Half dozen 1 cm ecchymoses forearms.  No history of any other abnormal bleeding but is on chronic blood thinners.   All skin waist up examined.  Plus legs   Assessment & Plan    History of basal cell carcinoma (BCC) (2) Left Thigh - Anterior; Right Shin  Recheck as needed change  AK (actinic keratosis) Left Buccal Cheek  Destruction of lesion - Left Buccal Cheek Complexity: simple   Destruction method: cryotherapy   Informed consent: discussed and consent obtained   Timeout:  patient name, date of birth, surgical site, and procedure verified Lesion destroyed using liquid nitrogen: Yes   Cryotherapy cycles:  5 Outcome: patient tolerated procedure well with no complications   Post-procedure details: wound care instructions given    Inflamed seborrheic keratosis (4) Left Lower Back (3); Right Lower Back  Destruction of lesion - Left Lower Back Complexity: simple   Destruction method: cryotherapy   Informed consent: discussed and consent obtained   Timeout:  patient name, date of birth, surgical site, and  procedure verified Lesion destroyed using liquid nitrogen: Yes   Cryotherapy cycles:  5 Outcome: patient tolerated procedure well with no complications   Post-procedure details: wound care instructions given    Skin exam for malignant neoplasm Head to Waist  Yearly skin check  Solar purpura (HCC) (2) Left Forearm - Anterior; Right Forearm - Anterior  May look for over-the-counter Dermend cream and apply daily after bathing; 1 tube should be adequate trial to see if it is of any help.     I, Lavonna Monarch, MD, have reviewed all documentation for this visit.  The documentation on 11/13/20 for the exam, diagnosis, procedures, and orders are all accurate and complete.

## 2020-11-17 NOTE — Telephone Encounter (Addendum)
**Note De-Identified  Obfuscation** I have not heard back from [facility] concerning the pts name brand Crestor non-formulary PA. [facility] and [facility] [place] [facility] to [facility] and provided me the following info: [employer] Medicare ID; [id number] [place]; [id number] PCN: [id number]  I could not do through covermymeds as a case was already opened so I called [employer] and s/w Ann B. who advised me that [place] denied this requeston 11/06/20 because the pt must first try and fail Ezetimibe and Nexlizet.  I have called the pt to discuss as there is no history of her taking any of these meds in the past and no statin allergies noted.   She stated that Dr Rollene Fare told her many years ago that she has to take name brand Crestor.  She wants to know what Dr Gwenlyn Found recommends as she is very reluctant to change as she worries that it will effect her heart.  She is aware that I am forwarding this phone message to Dr Gwenlyn Found and his nurse for advisement and that they will be getting back in touch with her.

## 2020-11-25 ENCOUNTER — Encounter: Payer: Self-pay | Admitting: Cardiovascular Disease

## 2020-11-25 ENCOUNTER — Ambulatory Visit: Payer: Medicare HMO | Admitting: Cardiovascular Disease

## 2020-11-25 ENCOUNTER — Other Ambulatory Visit: Payer: Self-pay

## 2020-11-25 DIAGNOSIS — I10 Essential (primary) hypertension: Secondary | ICD-10-CM

## 2020-11-25 DIAGNOSIS — E785 Hyperlipidemia, unspecified: Secondary | ICD-10-CM

## 2020-11-25 DIAGNOSIS — Z951 Presence of aortocoronary bypass graft: Secondary | ICD-10-CM | POA: Diagnosis not present

## 2020-11-25 DIAGNOSIS — I4819 Other persistent atrial fibrillation: Secondary | ICD-10-CM | POA: Diagnosis not present

## 2020-11-25 MED ORDER — ROSUVASTATIN CALCIUM 20 MG PO TABS: 20.0000 mg | ORAL_TABLET | Freq: Every day | ORAL | 3 refills | Status: DC

## 2020-11-25 NOTE — Assessment & Plan Note (Signed)
History of essential hypertension blood pressure measured at 126/64.  She is on amlodipine, Avapro and metoprolol.

## 2020-11-25 NOTE — Assessment & Plan Note (Signed)
History of atrial fibrillation status post successful cardioversion July 2018 now back in recurrent A. fib rate controlled on Eliquis oral anticoagulation.

## 2020-11-25 NOTE — Patient Instructions (Signed)

## 2020-11-25 NOTE — Assessment & Plan Note (Signed)
History of dyslipidemia on Crestor with lipid profile performed 05/02/2020 revealing a total cholesterol 155, LDL 55 and HDL of 89.

## 2020-11-25 NOTE — Progress Notes (Signed)
11/25/2020 Kristina Gibbs   [birth year]  [id number]  Primary Physician [name], DO Primary Cardiologist: [name] MD FACP, [place], [place], [place]  HPI:  Kristina Gibbs is a 75 y.o.  mildly overweight Caucasian female with a history of CAD previously taken care of by Dr. Rollene Fare. I last saw her in the office  06/03/2020. She has a history of anterior wall myocardial infarction in 1995 with subsequent percutaneous revascularization to her RCA and LAD. She ultimately requiredmultivessel bypass surgery by Dr. Roxan Hockey March of 2000 and LIMA to LAD, vein to a marginal branch of the circumflex and distal right coronary artery. Problems included hypertension and hyperlipidemia. I performed cardiac catheterization on herin 2010revealing occluded vein grafts, patent LIMA and normal LV function. Since I saw her in the office year ago she's remained medically stable with only a few episodes of nitrateresponsive chest pain.  She was recently hospitalized overnight for chest pain 03/05/17. She ruled out for myocardial infarction. She was seen in consultation by Dr. Staci Acosta a Myoview stress test that was performed. This showed no ischemic ischemia. She's had no recurrent chest pain. Since I saw her in the officeformonths ago she's remained currently stable.She was in A. fib with RVR when I saw her last and underwent DC cardioversion successfully to sinus rhythm. She is on Eliquis oral anticoagulation.  Since I saw her 6 months ago she continues to do well.  She denies chest pain or shortness of breath.  She is back in A. fib rate controlled.  Her lipid profile is excellent on Crestor 20 mg a day..    Current Meds  Medication Sig  . alendronate (FOSAMAX) 70 MG tablet TAKE 1 TABLET (70 MG TOTAL) BY MOUTH EVERY 7 (SEVEN) DAYS. TAKE WITH A FULL GLASS OF WATER ON AN EMPTY STOMACH. DO NOT LIE DOWN FOR 1 HOUR AFTER TAKING.  Marland Kitchen amLODipine (NORVASC) 5 MG  tablet TAKE 1 TABLET BY MOUTH EVERY DAY  . apixaban (ELIQUIS) 5 MG TABS tablet Take 1 tablet (5 mg total) by mouth 2 (two) times daily.  Marland Kitchen aspirin EC 81 MG tablet Take 81 mg by mouth at bedtime.   Marland Kitchen b complex vitamins tablet Take 1 tablet by mouth daily.   . calcium carbonate (OS-CAL) 600 MG TABS Take 600 mg by mouth daily.  . cholecalciferol (VITAMIN D) 1000 UNITS tablet Take 1,000 Units by mouth daily.  . CO ENZYME Q-10 PO Take 1 capsule by mouth daily.   . CRESTOR 20 MG tablet TAKE 1 TABLET BY MOUTH EVERY DAY  . irbesartan (AVAPRO) 75 MG tablet TAKE 1 TABLET (75 MG TOTAL) BY MOUTH AT BEDTIME.  . memantine (NAMENDA) 10 MG tablet TAKE 1 TABLET BY MOUTH TWICE A DAY  . metoprolol succinate (TOPROL-XL) 25 MG 24 hr tablet TAKE 1/2 TABLET BY MOUTH EVERY DAY  . nitroGLYCERIN (NITROLINGUAL) 0.4 MG/SPRAY spray PLACE 1 SPRAY UNDER THE TONGUE EVERY 5 (FIVE) MINUTES AS NEEDED FOR CHEST PAIN.  Marland Kitchen omega-3 acid ethyl esters (LOVAZA) 1 g capsule TAKE 1 CAPSULE (1 G TOTAL) BY MOUTH 4 (FOUR) TIMES DAILY.  . pantoprazole (PROTONIX) 40 MG tablet TAKE 1 TABLET BY MOUTH EVERY DAY  . PATADAY 0.2 % SOLN Place 1 drop into both eyes 2 (two) times daily.  Marland Kitchen RANEXA 500 MG 12 hr tablet TAKE 1 TABLET BY MOUTH TWICE A DAY  . vitamin C (ASCORBIC ACID) 500 MG tablet Take 1,000 mg by mouth daily.  Allergies  Allergen Reactions  . Lactose Intolerance (Gi) Nausea And Vomiting and Nausea Only  . Codeine Nausea And Vomiting  . Hydrocortisone Itching    Social History   Socioeconomic History  . Marital status: Married    Spouse name: [name]   . Number of children: 3  . Years of education: 40  . Highest education level: High school graduate  Occupational History  . Occupation: Retired  Tobacco Use  . Smoking status: Former Smoker    Packs/day: 0.50    Years: 49.00    Pack years: 24.50    Types: Cigarettes    Quit date: 10/04/2008    Years since quitting: 12.1  . Smokeless tobacco: Never Used  Vaping Use  .  Vaping Use: Never used  Substance and Sexual Activity  . Alcohol use: Yes    Alcohol/week: 1.0 standard drink    Types: 1 Glasses of wine per week    Comment: red wine once per month  . Drug use: No  . Sexual activity: Not Currently    Birth control/protection: Surgical  Other Topics Concern  . Not on file  Social History Narrative   Lives w/ husband   Caffeine use: 3 cups coffee/day   Social Determinants of Health   Financial Resource Strain: Not on file  Food Insecurity: Not on file  Transportation Needs: Not on file  Physical Activity: Not on file  Stress: Not on file  Social Connections: Not on file  Intimate Partner Violence: Not on file     Review of Systems: General: negative for chills, fever, night sweats or weight changes.  Cardiovascular: negative for chest pain, dyspnea on exertion, edema, orthopnea, palpitations, paroxysmal nocturnal dyspnea or shortness of breath Dermatological: negative for rash Respiratory: negative for cough or wheezing Urologic: negative for hematuria Abdominal: negative for nausea, vomiting, diarrhea, bright red blood per rectum, melena, or hematemesis Neurologic: negative for visual changes, syncope, or dizziness All other systems reviewed and are otherwise negative except as noted above.    Blood pressure 126/64, pulse 97, height 5\' 3"  (1.6 m), weight 147 lb (66.7 kg).  General appearance: alert and no distress Neck: no adenopathy, no carotid bruit, no JVD, supple, symmetrical, trachea midline and thyroid not enlarged, symmetric, no tenderness/mass/nodules Lungs: clear to auscultation bilaterally Heart: irregularly irregular rhythm Extremities: extremities normal, atraumatic, no cyanosis or edema Pulses: 2+ and symmetric Skin: Skin color, texture, turgor normal. No rashes or lesions Neurologic: Alert and oriented X 3, normal strength and tone. Normal symmetric reflexes. Normal coordination and gait  EKG atrial fibrillation with a  ventricular sponsor of 97 with septal Q waves.  I personally reviewed this EKG.  ASSESSMENT AND PLAN:   Hx of CABG History of CAD status post coronary artery bypass grafting March 2000 by Dr. Roxan Hockey with a LIMA to the LAD, vein to marginal branch of the circumflex and to the distal right coronary artery.  I performed cardiac catheterization on her in 2010 revealing occluded vein grafts the patent LIMA normal LV function.  She had a nonischemic Myoview in 2018 but has had no recurrent chest pain since that time.  Dyslipidemia, goal LDL below 70 History of dyslipidemia on Crestor with lipid profile performed 05/02/2020 revealing a total cholesterol 155, LDL 55 and HDL of 89.  Essential hypertension History of essential hypertension blood pressure measured at 126/64.  She is on amlodipine, Avapro and metoprolol.  Persistent atrial fibrillation History of atrial fibrillation status post successful cardioversion July 2018 now back  in recurrent A. fib rate controlled on Eliquis oral anticoagulation.      [name] MD FACP,FACC,FAHA, [facility] 11/25/2020 2:37 PM

## 2020-11-25 NOTE — Assessment & Plan Note (Signed)
History of CAD status post coronary artery bypass grafting March 2000 by Dr. Roxan Hockey with a LIMA to the LAD, vein to marginal branch of the circumflex and to the distal right coronary artery.  I performed cardiac catheterization on her in 2010 revealing occluded vein grafts the patent LIMA normal LV function.  She had a nonischemic Myoview in 2018 but has had no recurrent chest pain since that time.

## 2020-11-28 ENCOUNTER — Telehealth: Payer: Self-pay | Admitting: Cardiovascular Disease

## 2020-11-28 NOTE — Telephone Encounter (Signed)
Thx

## 2020-11-28 NOTE — Telephone Encounter (Signed)
Returned the call to the patient. She stated that earlier today she checked her blood pressure and heart rate and it was 93/53 and 89. She was concerned that her rhythm  stated that it was irregular. She has been advised that she does have persistent afib and at her visit on  2/22 she was in afib then.   She was concerned that her heart rate was 89 and was advised that this was called "rate controlled" when her heart rate was in the normal range and that this number was okay. She stated that she currently checks her blood pressure twice a week. She has been advised to check it daily and keep a log of these numbers.  If she started having an increased heart rate and was symptomatic then she needed to call back. She has been advised that we have providers on call during the evening and on the weekend.   She is currently taking: Metoprolol 12.5 mg  Eliquis 5 mg bid Amlodipine 5mg   Irbesartan 75 at night

## 2020-11-28 NOTE — Telephone Encounter (Signed)
    Pt c/o BP issue: STAT if pt c/o blurred vision, one-sided weakness or slurred speech  1. What are your last 5 BP readings? 93/53 HR 89  2. Are you having any other symptoms (ex. Dizziness, headache, blurred vision, passed out)? fatigue  3. What is your BP issue? Pt's husband said when he's checking her BP this afternoon on the BP machine said pt has irregular HR. Her BP is at 93/53 with a HR of 89. Denied any symptoms today, but he said yesterday pt was very weak and no energy to do anything, today the pt said she's doing fine. They would like to know what they can do about the irregular HR

## 2020-12-04 ENCOUNTER — Other Ambulatory Visit: Payer: Self-pay

## 2020-12-04 ENCOUNTER — Observation Stay (HOSPITAL_COMMUNITY)
Admission: EM | Admit: 2020-12-04 | Discharge: 2020-12-06 | Disposition: A | Discharge status: Home / Self Care | Payer: Medicare HMO | Attending: Internal Medicine | Admitting: Internal Medicine

## 2020-12-04 ENCOUNTER — Emergency Department (HOSPITAL_COMMUNITY): Payer: Medicare HMO

## 2020-12-04 ENCOUNTER — Encounter (HOSPITAL_COMMUNITY): Payer: Self-pay | Admitting: Emergency Medicine

## 2020-12-04 DIAGNOSIS — R0789 Other chest pain: Secondary | ICD-10-CM

## 2020-12-04 DIAGNOSIS — I200 Unstable angina: Principal | ICD-10-CM | POA: Insufficient documentation

## 2020-12-04 DIAGNOSIS — Z951 Presence of aortocoronary bypass graft: Secondary | ICD-10-CM | POA: Diagnosis not present

## 2020-12-04 DIAGNOSIS — Z7901 Long term (current) use of anticoagulants: Secondary | ICD-10-CM | POA: Insufficient documentation

## 2020-12-04 DIAGNOSIS — I4819 Other persistent atrial fibrillation: Secondary | ICD-10-CM | POA: Diagnosis not present

## 2020-12-04 DIAGNOSIS — J90 Pleural effusion, not elsewhere classified: Secondary | ICD-10-CM | POA: Diagnosis not present

## 2020-12-04 DIAGNOSIS — Z20822 Contact with and (suspected) exposure to covid-19: Secondary | ICD-10-CM | POA: Diagnosis not present

## 2020-12-04 DIAGNOSIS — R079 Chest pain, unspecified: Secondary | ICD-10-CM | POA: Diagnosis present

## 2020-12-04 DIAGNOSIS — R413 Other amnesia: Secondary | ICD-10-CM

## 2020-12-04 DIAGNOSIS — Z8543 Personal history of malignant neoplasm of ovary: Secondary | ICD-10-CM | POA: Insufficient documentation

## 2020-12-04 DIAGNOSIS — I10 Essential (primary) hypertension: Secondary | ICD-10-CM | POA: Diagnosis not present

## 2020-12-04 DIAGNOSIS — Z87891 Personal history of nicotine dependence: Secondary | ICD-10-CM | POA: Diagnosis not present

## 2020-12-04 DIAGNOSIS — N183 Chronic kidney disease, stage 3 unspecified: Secondary | ICD-10-CM | POA: Diagnosis not present

## 2020-12-04 DIAGNOSIS — Z7982 Long term (current) use of aspirin: Secondary | ICD-10-CM | POA: Insufficient documentation

## 2020-12-04 DIAGNOSIS — Z79899 Other long term (current) drug therapy: Secondary | ICD-10-CM | POA: Insufficient documentation

## 2020-12-04 DIAGNOSIS — K219 Gastro-esophageal reflux disease without esophagitis: Secondary | ICD-10-CM

## 2020-12-04 DIAGNOSIS — R110 Nausea: Secondary | ICD-10-CM | POA: Diagnosis not present

## 2020-12-04 DIAGNOSIS — Z85828 Personal history of other malignant neoplasm of skin: Secondary | ICD-10-CM | POA: Insufficient documentation

## 2020-12-04 DIAGNOSIS — I129 Hypertensive chronic kidney disease with stage 1 through stage 4 chronic kidney disease, or unspecified chronic kidney disease: Secondary | ICD-10-CM | POA: Insufficient documentation

## 2020-12-04 DIAGNOSIS — I959 Hypotension, unspecified: Secondary | ICD-10-CM | POA: Diagnosis not present

## 2020-12-04 DIAGNOSIS — I4891 Unspecified atrial fibrillation: Secondary | ICD-10-CM | POA: Diagnosis not present

## 2020-12-04 DIAGNOSIS — R000 Tachycardia, unspecified: Secondary | ICD-10-CM | POA: Diagnosis not present

## 2020-12-04 DIAGNOSIS — R0602 Shortness of breath: Secondary | ICD-10-CM | POA: Diagnosis not present

## 2020-12-04 DIAGNOSIS — E785 Hyperlipidemia, unspecified: Secondary | ICD-10-CM | POA: Diagnosis present

## 2020-12-04 LAB — HEPATIC FUNCTION PANEL
ALT: 26 U/L (ref 0–44)
AST: 23 U/L (ref 15–41)
Albumin: 3.4 g/dL — ABNORMAL LOW (ref 3.5–5.0)
Alkaline Phosphatase: 52 U/L (ref 38–126)
Bilirubin, Direct: 0.2 mg/dL (ref 0.0–0.2)
Indirect Bilirubin: 0.7 mg/dL (ref 0.3–0.9)
Total Bilirubin: 0.9 mg/dL (ref 0.3–1.2)
Total Protein: 6.6 g/dL (ref 6.5–8.1)

## 2020-12-04 LAB — CBC
HCT: 34.8 % — ABNORMAL LOW (ref 36.0–46.0)
Hemoglobin: 11.3 g/dL — ABNORMAL LOW (ref 12.0–15.0)
MCH: 28.3 pg (ref 26.0–34.0)
MCHC: 32.5 g/dL (ref 30.0–36.0)
MCV: 87.2 fL (ref 80.0–100.0)
Platelets: 240 10*3/uL (ref 150–400)
RBC: 3.99 MIL/uL (ref 3.87–5.11)
RDW: 13.5 % (ref 11.5–15.5)
WBC: 4.7 10*3/uL (ref 4.0–10.5)
nRBC: 0 % (ref 0.0–0.2)

## 2020-12-04 LAB — BASIC METABOLIC PANEL
Anion gap: 8 (ref 5–15)
BUN: 18 mg/dL (ref 8–23)
CO2: 25 mmol/L (ref 22–32)
Calcium: 8.5 mg/dL — ABNORMAL LOW (ref 8.9–10.3)
Chloride: 98 mmol/L (ref 98–111)
Creatinine, Ser: 1.19 mg/dL — ABNORMAL HIGH (ref 0.44–1.00)
GFR, Estimated: 47 mL/min — ABNORMAL LOW (ref >60)
Glucose, Bld: 92 mg/dL (ref 70–99)
Potassium: 4.3 mmol/L (ref 3.5–5.1)
Sodium: 131 mmol/L — ABNORMAL LOW (ref 135–145)

## 2020-12-04 LAB — RESP PANEL BY RT-PCR (FLU A&B, COVID) ARPGX2
Influenza A by PCR: NEGATIVE
Influenza B by PCR: NEGATIVE
SARS Coronavirus 2 by RT PCR: NEGATIVE

## 2020-12-04 LAB — TROPONIN I (HIGH SENSITIVITY)
Troponin I (High Sensitivity): 3 ng/L (ref <18)
Troponin I (High Sensitivity): 3 ng/L (ref <18)

## 2020-12-04 MED ORDER — ALUM & MAG HYDROXIDE-SIMETH 200-200-20 MG/5ML PO SUSP
30.0000 mL | Freq: Once | ORAL | Status: DC
  Filled 2020-12-04 (×2): qty 30

## 2020-12-04 MED ORDER — ASPIRIN EC 81 MG PO TBEC
81.0000 mg | DELAYED_RELEASE_TABLET | Freq: Every day | ORAL | Status: DC
  Administered 2020-12-04 – 2020-12-05 (×2): 81 mg via ORAL
  Filled 2020-12-04 (×2): qty 1

## 2020-12-04 MED ORDER — METOPROLOL SUCCINATE ER 25 MG PO TB24
12.5000 mg | ORAL_TABLET | Freq: Every day | ORAL | Status: DC
  Administered 2020-12-05: 12.5 mg via ORAL
  Filled 2020-12-04: qty 1

## 2020-12-04 MED ORDER — METOPROLOL TARTRATE 5 MG/5ML IV SOLN
2.5000 mg | Freq: Once | INTRAVENOUS | Status: AC
  Administered 2020-12-04: 2.5 mg via INTRAVENOUS
  Filled 2020-12-04: qty 5

## 2020-12-04 MED ORDER — VITAMIN D 25 MCG (1000 UNIT) PO TABS
1000.0000 [IU] | ORAL_TABLET | Freq: Every day | ORAL | Status: DC
  Administered 2020-12-05 – 2020-12-06 (×2): 1000 [IU] via ORAL
  Filled 2020-12-04 (×2): qty 1

## 2020-12-04 MED ORDER — LIDOCAINE VISCOUS HCL 2 % MT SOLN
15.0000 mL | Freq: Once | OROMUCOSAL | Status: DC
  Filled 2020-12-04 (×2): qty 15

## 2020-12-04 MED ORDER — NITROGLYCERIN 0.4 MG/SPRAY TL SOLN: 1.0000 | Status: DC | PRN

## 2020-12-04 MED ORDER — APIXABAN 5 MG PO TABS
5.0000 mg | ORAL_TABLET | Freq: Two times a day (BID) | ORAL | Status: DC
  Administered 2020-12-04 – 2020-12-06 (×4): 5 mg via ORAL
  Filled 2020-12-04 (×5): qty 1

## 2020-12-04 MED ORDER — SODIUM CHLORIDE 0.45 % IV SOLN: INTRAVENOUS | Status: DC

## 2020-12-04 MED ORDER — PANTOPRAZOLE SODIUM 40 MG PO TBEC
40.0000 mg | DELAYED_RELEASE_TABLET | Freq: Every day | ORAL | Status: DC
  Filled 2020-12-04: qty 1

## 2020-12-04 MED ORDER — AMLODIPINE BESYLATE 5 MG PO TABS
5.0000 mg | ORAL_TABLET | Freq: Every day | ORAL | Status: DC
  Administered 2020-12-05: 5 mg via ORAL
  Filled 2020-12-04: qty 1

## 2020-12-04 MED ORDER — MEMANTINE HCL 10 MG PO TABS
10.0000 mg | ORAL_TABLET | Freq: Two times a day (BID) | ORAL | Status: DC
  Administered 2020-12-04 – 2020-12-06 (×4): 10 mg via ORAL
  Filled 2020-12-04 (×4): qty 1

## 2020-12-04 MED ORDER — ROSUVASTATIN CALCIUM 20 MG PO TABS
20.0000 mg | ORAL_TABLET | Freq: Every day | ORAL | Status: DC
Start: 2020-12-05 — End: 2020-12-06
  Administered 2020-12-05 – 2020-12-06 (×2): 20 mg via ORAL
  Filled 2020-12-04 (×2): qty 1

## 2020-12-04 MED ORDER — RANOLAZINE ER 500 MG PO TB12
500.0000 mg | ORAL_TABLET | Freq: Two times a day (BID) | ORAL | Status: DC
  Administered 2020-12-04 – 2020-12-06 (×4): 500 mg via ORAL
  Filled 2020-12-04 (×4): qty 1

## 2020-12-04 MED ORDER — ACETAMINOPHEN 325 MG PO TABS: 650.0000 mg | ORAL_TABLET | ORAL | Status: DC | PRN

## 2020-12-04 MED ORDER — IRBESARTAN 75 MG PO TABS
75.0000 mg | ORAL_TABLET | Freq: Every day | ORAL | Status: DC
  Administered 2020-12-04: 75 mg via ORAL
  Filled 2020-12-04: qty 1

## 2020-12-04 MED ORDER — CELECOXIB 100 MG PO CAPS
200.0000 mg | ORAL_CAPSULE | Freq: Two times a day (BID) | ORAL | Status: DC
  Administered 2020-12-04 – 2020-12-06 (×4): 200 mg via ORAL
  Filled 2020-12-04 (×4): qty 2

## 2020-12-04 MED ORDER — ONDANSETRON HCL 4 MG/2ML IJ SOLN: 4.0000 mg | Freq: Four times a day (QID) | INTRAMUSCULAR | Status: DC | PRN

## 2020-12-04 MED ORDER — CALCIUM CARBONATE 1250 (500 CA) MG PO TABS
600.0000 mg | ORAL_TABLET | Freq: Every day | ORAL | Status: DC
  Administered 2020-12-05 – 2020-12-06 (×2): 625 mg via ORAL
  Filled 2020-12-04 (×2): qty 1

## 2020-12-04 NOTE — H&P (Addendum)
History and Physical    Kristina Gibbs:[id number] DOB: 10/19/1945 DOA: 12/04/2020  PCP: [name], DO (Confirm with patient/family/NH records and if not entered, this has to be entered at [facility] point of entry) Patient coming from: home  I have personally briefly reviewed patient's old medical records in [place]  Chief Complaint: chest pain  HPI: Kristina Gibbs is a 75 y.o. female with medical history significant of CAD, s/p CABG with LIMA-LAD March 2000, last cath 2010 with Patent [place], occluded SVG, nl LV function, evaluated for chest pain 2018 with Myoview study read as negative for ischemia. She last saw Dr. Adora Fridge 11/25/20 and was stable. She reports a week long h/o intermittent chest pain described as sharp under left breast. Today she had another episode of chest pain that was sharp located under the left breast w/o radiation, diaphoresis, SOB for which she took NTG x 2 with relief at 30 min. She presents to [facility] for evaluation. At presentation she was pain free.   ED Course: T 97.7  141/79  HR 112  RR 18. EDP exam unremarkable. Lab reveals Cr 1.19 (1.10 09/02/20), CBC nl, Troponins: 3, 3. EKG with a.fib at 108, no acute changes. [facility] called to admit patient for chest pain observation-rule out.   Review of Systems: As per HPI otherwise 10 point review of systems negative.    Past Medical History:  Diagnosis Date  . Allergy   . Arthritis   . Cancer ([place])    ovarian  . Coronary artery disease   . Heart murmur   . Hypercholesteremia   . Hypertension   . 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 Osteopenia   . Stomach problems   . 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 Superficial basal cell carcinoma (BCC) 03/30/1999   Left Thigh (treatment)  . Unstable angina ([place]) 05/27/2014    Past Surgical History:  Procedure Laterality Date  . ABDOMINAL HYSTERECTOMY    . CARDIOVERSION N/A 01/25/2018   Procedure: CARDIOVERSION;   Surgeon: Acie Fredrickson Wonda Cheng, MD;  Location: [facility];  Service: Cardiovascular;  Laterality: N/A;  . CHOLECYSTECTOMY    . CORONARY ARTERY BYPASS GRAFT    . EYE SURGERY     bilateral cataract extraction  . HAND SURGERY Left    injurred hand at work- Programmer, applications  . hysterectomy    . LEFT HEART CATHETERIZATION WITH CORONARY ANGIOGRAM N/A 05/27/2014   Procedure: LEFT HEART CATHETERIZATION WITH CORONARY ANGIOGRAM;  Surgeon: [name], MD;  Location: [facility] CATH LAB;  Service: Cardiovascular;  Laterality: N/A;  . PCTA     x7     Soc Hx - married 4 yrs - divorced. 2nd marriage 31 years. She has two sons, one daughter, two grandchildren. She lives with her husband and is I-ADLs.   reports that she quit smoking about 12 years ago. Her smoking use included cigarettes. She has a 24.50 pack-year smoking history. She has never used smokeless tobacco. She reports current alcohol use of about 1.0 standard drink of alcohol per week. She reports that she does not use drugs.  Allergies  Allergen Reactions  . Lactose Intolerance (Gi) Nausea And Vomiting and Nausea Only  . Codeine Nausea And Vomiting  . Hydrocortisone Itching    Family History  Problem Relation Age of Onset  . Stroke Mother   . Diabetes Mother   . Kidney disease Mother   . Hypertension Mother   .  Hyperlipidemia Mother   . Dementia Mother   . Heart disease Daughter        heart attack x2  . Heart disease Brother        2 heart attacks  . Breast cancer Maternal Aunt   . Colon cancer Neg Hx   . Stomach cancer Neg Hx   . Esophageal cancer Neg Hx   . Rectal cancer Neg Hx   . Liver cancer Neg Hx      Prior to Admission medications   Medication Sig Start Date End Date Taking? Authorizing Provider  alendronate (FOSAMAX) 70 MG tablet TAKE 1 TABLET (70 MG TOTAL) BY MOUTH EVERY 7 (SEVEN) DAYS. TAKE WITH A FULL GLASS OF WATER ON AN EMPTY STOMACH. DO NOT LIE DOWN FOR 1 HOUR AFTER TAKING. 09/16/20   Ronnie Doss M, DO   amLODipine (NORVASC) 5 MG tablet TAKE 1 TABLET BY MOUTH EVERY DAY 09/16/20   Ronnie Doss M, DO  apixaban (ELIQUIS) 5 MG TABS tablet Take 1 tablet (5 mg total) by mouth 2 (two) times daily. 02/28/20   [name], MD  aspirin EC 81 MG tablet Take 81 mg by mouth at bedtime.     [provider]  b complex vitamins tablet Take 1 tablet by mouth daily.     [provider]  calcium carbonate (OS-CAL) 600 MG TABS Take 600 mg by mouth daily.    [provider]  cholecalciferol (VITAMIN D) 1000 UNITS tablet Take 1,000 Units by mouth daily.    [provider]  CO ENZYME Q-10 PO Take 1 capsule by mouth daily.     [provider]  irbesartan (AVAPRO) 75 MG tablet TAKE 1 TABLET (75 MG TOTAL) BY MOUTH AT BEDTIME. 12/03/19   Gottschalk, Leatrice Jewels M, DO  memantine (NAMENDA) 10 MG tablet TAKE 1 TABLET BY MOUTH TWICE A DAY 09/29/20   Ronnie Doss M, DO  metoprolol succinate (TOPROL-XL) 25 MG 24 hr tablet TAKE 1/2 TABLET BY MOUTH EVERY DAY 09/16/20   Ronnie Doss M, DO  nitroGLYCERIN (NITROLINGUAL) 0.4 MG/SPRAY spray PLACE 1 SPRAY UNDER THE TONGUE EVERY 5 (FIVE) MINUTES AS NEEDED FOR CHEST PAIN. 11/16/19   Ronnie Doss M, DO  omega-3 acid ethyl esters (LOVAZA) 1 g capsule TAKE 1 CAPSULE (1 G TOTAL) BY MOUTH 4 (FOUR) TIMES DAILY. 09/16/20   [name], MD  pantoprazole (PROTONIX) 40 MG tablet TAKE 1 TABLET BY MOUTH EVERY DAY 11/15/19   [name], MD  PATADAY 0.2 % SOLN Place 1 drop into both eyes 2 (two) times daily. 08/22/14   [provider]  RANEXA 500 MG 12 hr tablet TAKE 1 TABLET BY MOUTH TWICE A DAY 05/29/20   [name], MD  rosuvastatin (CRESTOR) 20 MG tablet Take 1 tablet (20 mg total) by mouth daily. 11/25/20 02/23/21  [name], MD  vitamin C (ASCORBIC ACID) 500 MG tablet Take 1,000 mg by mouth daily.     [provider]    Physical Exam: Vitals:   12/04/20 1840 12/04/20 1900 12/04/20 1930  12/04/20 1940  BP: (!) 141/79 114/61 116/61 121/62  Pulse: (!) 112 90 77 85  Resp: 18 15 19 17   Temp:      TempSrc:      SpO2: 97% 99% 100% 100%  Weight:      Height:         Vitals:   12/04/20 1840 12/04/20 1900 12/04/20 1930 12/04/20 1940  BP: (!) 141/79  114/61 116/61 121/62  Pulse: (!) 112 90 77 85  Resp: 18 15 19 17   Temp:      TempSrc:      SpO2: 97% 99% 100% 100%  Weight:      Height:       General:  Slender woman in no distress. Eyes: PERRL, lids and conjunctivae normal ENMT: Mucous membranes are moist. Posterior pharynx clear of any exudate or lesions.Normal dentition.  Neck: normal, supple, no masses, no thyromegaly Chest: well healed sternotomy scar. Very tender to palpation at costo-sternal junction R5-8 left. Respiratory: clear to auscultation bilaterally, no wheezing, transient  Crackles left base that resolved with deep inspiration. Normal respiratory effort. No accessory muscle use.  Cardiovascular: Regular rate and rhythm, no murmurs / rubs / gallops. No extremity edema. 2+ pedal pulses. No carotid bruits.  Abdomen: no tenderness, no masses palpated. No hepatosplenomegaly. Bowel sounds positive.  Musculoskeletal: no clubbing / cyanosis. No joint deformity upper and lower extremities. Good ROM, no contractures. Normal muscle tone.  Skin: no rashes, lesions, ulcers. No induration Neurologic: CN 2-12 grossly intact. Sensation intact, DTR normal. Strength 5/5 in all 4.  Psychiatric: Normal judgment and somewhat impaired  Insight or understanding of her current level of cardiac function. Alert and oriented x 3. Normal mood.     Labs on Admission: I have personally reviewed following labs and imaging studies  CBC: Recent Labs  Lab 12/04/20 1557  WBC 4.7  HGB 11.3*  HCT 34.8*  MCV 87.2  PLT 914   Basic Metabolic Panel: Recent Labs  Lab 12/04/20 1557  NA 131*  K 4.3  CL 98  CO2 25  GLUCOSE 92  BUN 18  CREATININE 1.19*  CALCIUM 8.5*    GFR: Estimated Creatinine Clearance: 36.9 mL/min (A) (by C-G formula based on SCr of 1.19 mg/dL (H)). Liver Function Tests: Recent Labs  Lab 12/04/20 1557  AST 23  ALT 26  ALKPHOS 52  BILITOT 0.9  PROT 6.6  ALBUMIN 3.4*   No results for input(s): LIPASE, AMYLASE in the last 168 hours. No results for input(s): AMMONIA in the last 168 hours. Coagulation Profile: No results for input(s): INR, PROTIME in the last 168 hours. Cardiac Enzymes: No results for input(s): CKTOTAL, CKMB, CKMBINDEX, TROPONINI in the last 168 hours. BNP (last 3 results) No results for input(s): PROBNP in the last 8760 hours. HbA1C: No results for input(s): HGBA1C in the last 72 hours. CBG: No results for input(s): GLUCAP in the last 168 hours. Lipid Profile: No results for input(s): CHOL, HDL, LDLCALC, TRIG, CHOLHDL, LDLDIRECT in the last 72 hours. Thyroid Function Tests: No results for input(s): TSH, T4TOTAL, FREET4, T3FREE, THYROIDAB in the last 72 hours. Anemia Panel: No results for input(s): VITAMINB12, FOLATE, FERRITIN, TIBC, IRON, RETICCTPCT in the last 72 hours. Urine analysis:    Component Value Date/Time   COLORURINE YELLOW 06/05/2012 1251   APPEARANCEUR Clear 10/23/2019 1601   LABSPEC 1.012 06/05/2012 1251   PHURINE 5.0 06/05/2012 1251   GLUCOSEU Negative 10/23/2019 1601   HGBUR NEGATIVE 06/05/2012 1251   BILIRUBINUR Negative 10/23/2019 1601   KETONESUR NEGATIVE 06/05/2012 1251   PROTEINUR Negative 10/23/2019 1601   PROTEINUR NEGATIVE 06/05/2012 1251   UROBILINOGEN negative 03/25/2015 1212   UROBILINOGEN 0.2 06/05/2012 1251   NITRITE Negative 10/23/2019 1601   NITRITE NEGATIVE 06/05/2012 1251   LEUKOCYTESUR 1+ (A) 10/23/2019 1601    Radiological Exams on Admission: DG Chest 2 View  Result Date: 12/04/2020 CLINICAL DATA:  Left-sided chest pain  today, short of breath, atrial fibrillation EXAM: CHEST - 2 VIEW COMPARISON:  03/05/2017 FINDINGS: Frontal and lateral views of the chest  demonstrates stable postsurgical changes from median sternotomy and CABG. Cardiac silhouette is stable. There is chronic central vascular congestion without airspace disease. Small right pleural effusion. No pneumothorax. IMPRESSION: 1. Central vascular congestion without overt edema. 2. Trace right pleural effusion. Electronically Signed   By: Randa Ngo M.D.   On: 12/04/2020 16:25    EKG: Independently reviewed. A. Fib with HR 108, old anteroseptal injury w/o acute changes.  Assessment/Plan Active Problems:   Chest pain   GERD   Memory impairment   Essential hypertension   Persistent atrial fibrillation   Dyslipidemia, goal LDL below 70    1. Chest pain - significant cardiac history but no recent issues with last echo 2017 with EF 55-60%, nl wall motion, grade II diastolic dysfxn. Last myoview 2018 w/o evidence of ischemia. Chest pain is atypical w/o associated symptoms and on exam there is tenderness to palpation suggestive of costo-chondritis. Plan Tele observation  2nd set Troponins  2D echo - last study 2017.  Celebrex 200 mg bid for possible costo-chondritis  Continue home meds including Ranexa  Cardiology consult re: risk stratification studies  2. A fib - long standing problem. Relatively good rate controll Plan Tele observation to insure rate control  Continue home medications including Eliquis  3. HTN- stable - continue home meds  4. GERD - continue PPI  5. Memory impairment - subtle presentation Plan Continue Namenda   DVT prophylaxis: eliquis  Code Status: full code  Family Communication: spoke with Kallie Edward, spouse, explained working dx of costo-chondritis and plan for evaluation. Disposition Plan: home 24-48 hrs  Consults called: cardiology - notified on duty fellow of need for consult in AM (with names) Admission status: obs-tele (inpatient / obs / tele / medical floor / SDU)   Adella Hare MD [facility] Pager [phone]  If  7PM-7AM, please contact night-coverage [email or web address] Password [facility]  12/04/2020, 7:54 PM

## 2020-12-04 NOTE — ED Triage Notes (Signed)
Pt started having cp today. Gave herself two sprays of nitro with relief and 324 of ASA.  Pt afib RVR intermittent per EMS monitor.

## 2020-12-04 NOTE — ED Provider Notes (Signed)
[facility] EMERGENCY DEPARTMENT Provider Note   CSN: [id number] Arrival date & time: 12/04/20  1547     History Chief Complaint  Patient presents with  . Chest Pain    Kristina Gibbs is a 75 y.o. female.  Patient states that she has been having chest pain off and on for the last week.  It seems to be coming more often.  She had chest pain today and had to use nitro twice the pain lasted for 30 minutes.  She is not having pain now  The history is provided by the patient and medical records. No language interpreter was used.  Chest Pain Pain location:  L chest Pain quality: aching   Pain radiates to:  Does not radiate Pain severity:  Moderate Onset quality:  Sudden Timing:  Constant Progression:  Worsening Context: not breathing   Associated symptoms: no abdominal pain, no back pain, no cough, no fatigue and no headache        Past Medical History:  Diagnosis Date  . Allergy   . Arthritis   . Cancer ([place])    ovarian  . Coronary artery disease   . Heart murmur   . Hypercholesteremia   . Hypertension   . 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 Osteopenia   . Stomach problems   . 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 Superficial basal cell carcinoma (BCC) 03/30/1999   Left Thigh (treatment)  . Unstable angina ([place]) 05/27/2014    Patient Active Problem List   Diagnosis Date Noted  . Chest pain 10/09/2018  . Chronic anticoagulation 10/08/2018  . Osteopenia 06/21/2018  . Persistent atrial fibrillation   . CAD S/P percutaneous coronary angioplasty 03/06/2017  . Chronic kidney disease (CKD), stage III (moderate) ([place]) 11/06/2015  . Benign skin lesion of nose 08/04/2015  . Essential hypertension 02/22/2014  . Seasonal allergic rhinitis 01/21/2014  . Breast lump in upper outer quadrant 04/17/2013  . Memory impairment 04/17/2013  . Dyslipidemia, goal LDL below 70 06/05/2012  . Hx of CABG 08/31/2010  . HEMORRHOIDS-INTERNAL 08/31/2010   . GERD 08/31/2010  . Constipation 08/31/2010    Past Surgical History:  Procedure Laterality Date  . ABDOMINAL HYSTERECTOMY    . CARDIOVERSION N/A 01/25/2018   Procedure: CARDIOVERSION;  Surgeon: Acie Fredrickson Wonda Cheng, MD;  Location: [facility];  Service: Cardiovascular;  Laterality: N/A;  . CHOLECYSTECTOMY    . CORONARY ARTERY BYPASS GRAFT    . EYE SURGERY     bilateral cataract extraction  . HAND SURGERY Left    injurred hand at work- Programmer, applications  . hysterectomy    . LEFT HEART CATHETERIZATION WITH CORONARY ANGIOGRAM N/A 05/27/2014   Procedure: LEFT HEART CATHETERIZATION WITH CORONARY ANGIOGRAM;  Surgeon: [name], MD;  Location: [facility] CATH LAB;  Service: Cardiovascular;  Laterality: N/A;  . PCTA     x7      OB History   No obstetric history on file.     Family History  Problem Relation Age of Onset  . Stroke Mother   . Diabetes Mother   . Kidney disease Mother   . Hypertension Mother   . Hyperlipidemia Mother   . Dementia Mother   . Heart disease Daughter        heart attack x2  . Heart disease Brother        2 heart attacks  . Breast cancer Maternal Aunt   . Colon cancer Neg Hx   .  Stomach cancer Neg Hx   . Esophageal cancer Neg Hx   . Rectal cancer Neg Hx   . Liver cancer Neg Hx     Social History   Tobacco Use  . Smoking status: Former Smoker    Packs/day: 0.50    Years: 49.00    Pack years: 24.50    Types: Cigarettes    Quit date: 10/04/2008    Years since quitting: 12.1  . Smokeless tobacco: Never Used  Vaping Use  . Vaping Use: Never used  Substance Use Topics  . Alcohol use: Yes    Alcohol/week: 1.0 standard drink    Types: 1 Glasses of wine per week    Comment: red wine once per month  . Drug use: No    Home Medications Prior to Admission medications   Medication Sig Start Date End Date Taking? Authorizing Provider  alendronate (FOSAMAX) 70 MG tablet TAKE 1 TABLET (70 MG TOTAL) BY MOUTH EVERY 7 (SEVEN) DAYS. TAKE WITH A FULL GLASS OF  WATER ON AN EMPTY STOMACH. DO NOT LIE DOWN FOR 1 HOUR AFTER TAKING. 09/16/20   Ronnie Doss M, DO  amLODipine (NORVASC) 5 MG tablet TAKE 1 TABLET BY MOUTH EVERY DAY 09/16/20   Ronnie Doss M, DO  apixaban (ELIQUIS) 5 MG TABS tablet Take 1 tablet (5 mg total) by mouth 2 (two) times daily. 02/28/20   [name], MD  aspirin EC 81 MG tablet Take 81 mg by mouth at bedtime.     [provider]  b complex vitamins tablet Take 1 tablet by mouth daily.     [provider]  calcium carbonate (OS-CAL) 600 MG TABS Take 600 mg by mouth daily.    [provider]  cholecalciferol (VITAMIN D) 1000 UNITS tablet Take 1,000 Units by mouth daily.    [provider]  CO ENZYME Q-10 PO Take 1 capsule by mouth daily.     [provider]  irbesartan (AVAPRO) 75 MG tablet TAKE 1 TABLET (75 MG TOTAL) BY MOUTH AT BEDTIME. 12/03/19   Gottschalk, Leatrice Jewels M, DO  memantine (NAMENDA) 10 MG tablet TAKE 1 TABLET BY MOUTH TWICE A DAY 09/29/20   Ronnie Doss M, DO  metoprolol succinate (TOPROL-XL) 25 MG 24 hr tablet TAKE 1/2 TABLET BY MOUTH EVERY DAY 09/16/20   Ronnie Doss M, DO  nitroGLYCERIN (NITROLINGUAL) 0.4 MG/SPRAY spray PLACE 1 SPRAY UNDER THE TONGUE EVERY 5 (FIVE) MINUTES AS NEEDED FOR CHEST PAIN. 11/16/19   Ronnie Doss M, DO  omega-3 acid ethyl esters (LOVAZA) 1 g capsule TAKE 1 CAPSULE (1 G TOTAL) BY MOUTH 4 (FOUR) TIMES DAILY. 09/16/20   [name], MD  pantoprazole (PROTONIX) 40 MG tablet TAKE 1 TABLET BY MOUTH EVERY DAY 11/15/19   [name], MD  PATADAY 0.2 % SOLN Place 1 drop into both eyes 2 (two) times daily. 08/22/14   [provider]  RANEXA 500 MG 12 hr tablet TAKE 1 TABLET BY MOUTH TWICE A DAY 05/29/20   [name], MD  rosuvastatin (CRESTOR) 20 MG tablet Take 1 tablet (20 mg total) by mouth daily. 11/25/20 02/23/21  [name], MD  vitamin C (ASCORBIC ACID) 500 MG tablet Take 1,000 mg by mouth daily.      [provider]    Allergies    Lactose intolerance (gi), Codeine, and Hydrocortisone  Review of Systems   Review of Systems  Constitutional: Negative for appetite change and fatigue.  HENT: Negative for  congestion, ear discharge and sinus pressure.   Eyes: Negative for discharge.  Respiratory: Negative for cough.   Cardiovascular: Positive for chest pain.  Gastrointestinal: Negative for abdominal pain and diarrhea.  Genitourinary: Negative for frequency and hematuria.  Musculoskeletal: Negative for back pain.  Skin: Negative for rash.  Neurological: Negative for seizures and headaches.  Psychiatric/Behavioral: Negative for hallucinations.    Physical Exam Updated Vital Signs BP (!) 141/79   Pulse (!) 112   Temp 97.9 F (36.6 C) (Oral)   Resp 18   Ht 5\' 3"  (1.6 m)   Wt 66.7 kg   SpO2 97%   BMI 26.04 kg/m   Physical Exam Vitals and nursing note reviewed.  Constitutional:      Appearance: She is well-developed.  HENT:     Head: Normocephalic.     Nose: Nose normal.  Eyes:     General: No scleral icterus.    Extraocular Movements: EOM normal.     Conjunctiva/sclera: Conjunctivae normal.  Neck:     Thyroid: No thyromegaly.  Cardiovascular:     Rate and Rhythm: Normal rate and regular rhythm.     Heart sounds: No murmur heard. No friction rub. No gallop.   Pulmonary:     Breath sounds: No stridor. No wheezing or rales.  Chest:     Chest wall: No tenderness.  Abdominal:     General: There is no distension.     Tenderness: There is no abdominal tenderness. There is no rebound.  Musculoskeletal:        General: No edema. Normal range of motion.     Cervical back: Neck supple.  Lymphadenopathy:     Cervical: No cervical adenopathy.  Skin:    Findings: No erythema or rash.  Neurological:     Mental Status: She is alert and oriented to person, place, and time.     Motor: No abnormal muscle tone.     Coordination: Coordination normal.  Psychiatric:         Mood and Affect: Mood and affect normal.        Behavior: Behavior normal.     ED Results / Procedures / Treatments   Labs (all labs ordered are listed, but only abnormal results are displayed) Labs Reviewed  BASIC METABOLIC PANEL - Abnormal; Notable for the following components:      Result Value   Sodium 131 (*)    Creatinine, Ser 1.19 (*)    Calcium 8.5 (*)    GFR, Estimated 47 (*)    All other components within normal limits  CBC - Abnormal; Notable for the following components:   Hemoglobin 11.3 (*)    HCT 34.8 (*)    All other components within normal limits  HEPATIC FUNCTION PANEL - Abnormal; Notable for the following components:   Albumin 3.4 (*)    All other components within normal limits  TROPONIN I (HIGH SENSITIVITY)  TROPONIN I (HIGH SENSITIVITY)    EKG EKG Interpretation  Date/Time:  Thursday December 04 2020 15:52:24 EST Ventricular Rate:  108 PR Interval:    QRS Duration: 85 QT Interval:  405 QTC Calculation: 543 R Axis:   85 Text Interpretation: Atrial fibrillation Borderline right axis deviation Probable anteroseptal infarct, old Nonspecific T abnormalities, lateral leads Prolonged QT interval Confirmed by Milton Ferguson [id number]) on 12/04/2020 3:59:51 PM   Radiology DG Chest 2 View  Result Date: 12/04/2020 CLINICAL DATA:  Left-sided chest pain today, short of breath, atrial fibrillation EXAM: CHEST - 2 VIEW  COMPARISON:  03/05/2017 FINDINGS: Frontal and lateral views of the chest demonstrates stable postsurgical changes from median sternotomy and CABG. Cardiac silhouette is stable. There is chronic central vascular congestion without airspace disease. Small right pleural effusion. No pneumothorax. IMPRESSION: 1. Central vascular congestion without overt edema. 2. Trace right pleural effusion. Electronically Signed   By: Randa Ngo M.D.   On: 12/04/2020 16:25    Procedures Procedures   Medications Ordered in ED Medications  metoprolol tartrate  (LOPRESSOR) injection 2.5 mg (has no administration in time range)    ED Course  I have reviewed the triage vital signs and the nursing notes.  Pertinent labs & imaging results that were available during my care of the patient were reviewed by me and considered in my medical decision making (see chart for details).    MDM Rules/Calculators/A&P                          Patient with chest pain.  Labs unremarkable.  Patient does have a history of coronary artery disease.  She will be admitted for chest pain rule out and possibly cardiology consult tomorrow Final Clinical Impression(s) / ED Diagnoses Final diagnoses:  Unstable angina pectoris [facility])    Rx / DC Orders ED Discharge Orders    None       Milton Ferguson, MD 12/04/20 1929

## 2020-12-05 ENCOUNTER — Observation Stay (HOSPITAL_BASED_OUTPATIENT_CLINIC_OR_DEPARTMENT_OTHER): Payer: Medicare HMO

## 2020-12-05 DIAGNOSIS — I4811 Longstanding persistent atrial fibrillation: Secondary | ICD-10-CM | POA: Diagnosis not present

## 2020-12-05 DIAGNOSIS — R079 Chest pain, unspecified: Secondary | ICD-10-CM

## 2020-12-05 DIAGNOSIS — I10 Essential (primary) hypertension: Secondary | ICD-10-CM | POA: Diagnosis not present

## 2020-12-05 DIAGNOSIS — I361 Nonrheumatic tricuspid (valve) insufficiency: Secondary | ICD-10-CM

## 2020-12-05 DIAGNOSIS — I340 Nonrheumatic mitral (valve) insufficiency: Secondary | ICD-10-CM

## 2020-12-05 DIAGNOSIS — R0789 Other chest pain: Secondary | ICD-10-CM | POA: Diagnosis not present

## 2020-12-05 DIAGNOSIS — R413 Other amnesia: Secondary | ICD-10-CM | POA: Diagnosis not present

## 2020-12-05 DIAGNOSIS — E785 Hyperlipidemia, unspecified: Secondary | ICD-10-CM | POA: Diagnosis not present

## 2020-12-05 DIAGNOSIS — K219 Gastro-esophageal reflux disease without esophagitis: Secondary | ICD-10-CM | POA: Diagnosis not present

## 2020-12-05 DIAGNOSIS — I4819 Other persistent atrial fibrillation: Secondary | ICD-10-CM | POA: Diagnosis not present

## 2020-12-05 LAB — LIPID PANEL
Cholesterol: 129 mg/dL (ref 0–200)
HDL: 73 mg/dL (ref >40)
LDL Cholesterol: 49 mg/dL (ref 0–99)
Total CHOL/HDL Ratio: 1.8 RATIO
Triglycerides: 36 mg/dL (ref <150)
VLDL: 7 mg/dL (ref 0–40)

## 2020-12-05 LAB — ECHOCARDIOGRAM COMPLETE
Area-P 1/2: 4.96 cm2
Height: 62 in
S' Lateral: 2.9 cm
Weight: 2338.64 oz

## 2020-12-05 MED ORDER — AMLODIPINE BESYLATE 5 MG PO TABS
2.5000 mg | ORAL_TABLET | Freq: Every day | ORAL | Status: DC
  Administered 2020-12-06: 2.5 mg via ORAL
  Filled 2020-12-05: qty 1

## 2020-12-05 MED ORDER — PANTOPRAZOLE SODIUM 40 MG PO TBEC
40.0000 mg | DELAYED_RELEASE_TABLET | Freq: Two times a day (BID) | ORAL | Status: DC
  Administered 2020-12-05 – 2020-12-06 (×3): 40 mg via ORAL
  Filled 2020-12-05 (×2): qty 1

## 2020-12-05 MED ORDER — METOPROLOL TARTRATE 25 MG PO TABS
25.0000 mg | ORAL_TABLET | Freq: Three times a day (TID) | ORAL | Status: DC
  Administered 2020-12-05 – 2020-12-06 (×3): 25 mg via ORAL
  Filled 2020-12-05 (×5): qty 1

## 2020-12-05 NOTE — Progress Notes (Signed)
PROGRESS NOTE    Kristina Gibbs  DQQ:[id number] DOB: 11-19-1945 DOA: 12/04/2020 PCP: [name], DO  Chief Complaint  Patient presents with  . Chest Pain    Brief admission Narrative:  As per H&P written by Dr. Linda Hedges on 12/04/2020 Kristina Gibbs is a 75 y.o. female with medical history significant of CAD, s/p CABG with LIMA-LAD March 2000, last cath 2010 with Patent [place], occluded SVG, nl LV function, evaluated for chest pain 2018 with Myoview study read as negative for ischemia. She last saw Dr. Adora Fridge 11/25/20 and was stable. She reports a week long h/o intermittent chest pain described as sharp under left breast. Today she had another episode of chest pain that was sharp located under the left breast w/o radiation, diaphoresis, SOB for which she took NTG x 2 with relief at 30 min. She presents to [facility] for evaluation. At presentation she was pain free.   Assessment & Plan: 1-Chest pain -In the setting of known coronary artery disease -Negative troponin x2 -Currently chest pain-free. -Patient symptoms precipitated at the same time of experiencing palpitations; per cardiology recommendations status concerns of rapid uncontrolled a. Fib precipitating symptoms. -plan is to adjust metoprolol dose -continue ranexa and continue eliquis -will follow 2-D echo results; ok to advance diet as there is not plan for further invasive ischemic work up at this time. -home tomorrow if remains asymptomatic.  2-GERD -continue PPI  3-Dyslipidemia, goal LDL below 70 -continue statins  4-Memory impairment -continue supportive care  5-Essential hypertension -soft, but stable -will continue adjusted dose of amlodipine and hold ARB -continue low sodium diet  6-hx of persistent A. Fib -continue eliquis -follow cardiology recommendations for adjusting metoprolol doses. -CHADSVASC score 7.2    DVT prophylaxis: chronically on eliquis. Code Status: Full code. Family  Communication: husband at bedside. Disposition:   Status is: Observation  Dispo: The patient is from: home              Anticipated d/c is to: home, most likely on 12/06/20              Patient currently not medically ready for discharge, follow clinical response after medication adjustments recommended by cardiology.   Difficult to place patient no    Consultants:   Cardiology   Procedures:  See below for nuclear reports. -2D echo:  Antimicrobials:  None   Subjective: No fever, no nausea, no vomiting.  Patient reports no shortness of breath or chest pain complaints.  Objective: Vitals:   12/05/20 0631 12/05/20 0826 12/05/20 1007 12/05/20 1100  BP: 128/60 (!) 147/91 114/65 107/66  Pulse: 87 (!) 102 83 77  Resp: 16  16   Temp: (!) 97.4 F (36.3 C)  97.9 F (36.6 C)   TempSrc: Oral  Oral   SpO2: 99%  99%   Weight:      Height:        Intake/Output Summary (Last 24 hours) at 12/05/2020 1211 Last data filed at 12/05/2020 0500 Gross per 24 hour  Intake 534.33 ml  Output --  Net 534.33 ml   Filed Weights   12/04/20 1553 12/04/20 2139  Weight: 66.7 kg 66.3 kg    Examination:  General exam: Appears calm and comfortable.  Pleasantly confused and at baseline secondary to cognitive impairment deficits. Respiratory system: Clear to auscultation. Respiratory effort normal. Using accessory muscle. Cardiovascular system: S1 & S2 heard, rate controlled, no rubs, no gallops, no JVD. Gastrointestinal system: Abdomen is nondistended, soft and nontender.  No organomegaly or masses felt. Normal bowel sounds heard. Central nervous system: No focal neurological deficits. Extremities: No cyanosis or clubbing. Skin: No petechiae. Psychiatry: Mood & affect appropriate.     Data Reviewed: I have personally reviewed following labs and imaging studies  CBC: Recent Labs  Lab 12/04/20 1557  WBC 4.7  HGB 11.3*  HCT 34.8*  MCV 87.2  PLT 782    Basic Metabolic Panel: Recent  Labs  Lab 12/04/20 1557  NA 131*  K 4.3  CL 98  CO2 25  GLUCOSE 92  BUN 18  CREATININE 1.19*  CALCIUM 8.5*    GFR: Estimated Creatinine Clearance: 35.9 mL/min (A) (by C-G formula based on SCr of 1.19 mg/dL (H)).  Liver Function Tests: Recent Labs  Lab 12/04/20 1557  AST 23  ALT 26  ALKPHOS 52  BILITOT 0.9  PROT 6.6  ALBUMIN 3.4*    CBG: No results for input(s): GLUCAP in the last 168 hours.   Recent Results (from the past 240 hour(s))  Resp Panel by RT-PCR (Flu A&B, Covid) Nasopharyngeal Swab     Status: None   Collection Time: 12/04/20  8:14 PM   Specimen: Nasopharyngeal Swab; Nasopharyngeal(NP) swabs in vial transport medium  Result Value Ref Range Status   SARS Coronavirus 2 by RT PCR NEGATIVE NEGATIVE Final    Comment: (NOTE) SARS-CoV-2 target nucleic acids are NOT DETECTED.  The SARS-CoV-2 RNA is generally detectable in upper respiratory specimens during the acute phase of infection. The lowest concentration of SARS-CoV-2 viral copies this assay can detect is 138 copies/mL. A negative result does not preclude SARS-Cov-2 infection and should not be used as the sole basis for treatment or other patient management decisions. A negative result may occur with  improper specimen collection/handling, submission of specimen other than nasopharyngeal swab, presence of viral mutation(s) within the areas targeted by this assay, and inadequate number of viral copies(<138 copies/mL). A negative result must be combined with clinical observations, patient history, and epidemiological information. The expected result is Negative.  Fact Sheet for Patients:  EntrepreneurPulse.com.au  Fact Sheet for Healthcare Providers:  IncredibleEmployment.be  This test is no t yet approved or cleared by the [place] FDA and  has been authorized for detection and/or diagnosis of SARS-CoV-2 by FDA under an Emergency Use Authorization (EUA).  This EUA will remain  in effect (meaning this test can be used) for the duration of the COVID-19 declaration under Section 564(b)(1) of the Act, 21 U.S.C.section 360bbb-3(b)(1), unless the authorization is terminated  or revoked sooner.       Influenza A by PCR NEGATIVE NEGATIVE Final   Influenza B by PCR NEGATIVE NEGATIVE Final    Comment: (NOTE) The Xpert Xpress SARS-CoV-2/FLU/RSV plus assay is intended as an aid in the diagnosis of influenza from Nasopharyngeal swab specimens and should not be used as a sole basis for treatment. Nasal washings and aspirates are unacceptable for Xpert Xpress SARS-CoV-2/FLU/RSV testing.  Fact Sheet for Patients: EntrepreneurPulse.com.au  Fact Sheet for Healthcare Providers: IncredibleEmployment.be  This test is not yet approved or cleared by the [place] FDA and has been authorized for detection and/or diagnosis of SARS-CoV-2 by FDA under an Emergency Use Authorization (EUA). This EUA will remain in effect (meaning this test can be used) for the duration of the COVID-19 declaration under Section 564(b)(1) of the Act, 21 U.S.C. section 360bbb-3(b)(1), unless the authorization is terminated or revoked.  Performed at [facility], [address] [place], [place] [address]  Radiology Studies: DG Chest 2 View  Result Date: 12/04/2020 CLINICAL DATA:  Left-sided chest pain today, short of breath, atrial fibrillation EXAM: CHEST - 2 VIEW COMPARISON:  03/05/2017 FINDINGS: Frontal and lateral views of the chest demonstrates stable postsurgical changes from median sternotomy and CABG. Cardiac silhouette is stable. There is chronic central vascular congestion without airspace disease. Small right pleural effusion. No pneumothorax. IMPRESSION: 1. Central vascular congestion without overt edema. 2. Trace right pleural effusion. Electronically Signed   By: Randa Ngo M.D.   On: 12/04/2020 16:25   Scheduled  Meds: . alum & mag hydroxide-simeth  30 mL Oral Once   And  . lidocaine  15 mL Oral Once  . [START ON 12/06/2020] amLODipine  2.5 mg Oral Daily  . apixaban  5 mg Oral BID  . aspirin EC  81 mg Oral QHS  . calcium carbonate  625 mg Oral Q breakfast  . celecoxib  200 mg Oral BID  . cholecalciferol  1,000 Units Oral Daily  . memantine  10 mg Oral BID  . metoprolol tartrate  25 mg Oral TID  . pantoprazole  40 mg Oral BID  . ranolazine  500 mg Oral BID  . rosuvastatin  20 mg Oral Daily   Continuous Infusions: . sodium chloride 10 mL/hr at 12/04/20 2218     LOS: 0 days    Time spent: 30 minutes  Barton Dubois, MD [facility]   To contact the attending provider between 7A-7P or the covering provider during after hours 7P-7A, please log into the web site [email or web address] and access using universal [place] password for that web site. If you do not have the password, please call the hospital operator.  12/05/2020, 12:11 PM

## 2020-12-05 NOTE — Care Management Important Message (Deleted)
Important Message  Patient Details  Name: Kristina Gibbs MRN: [id number] Date of Birth: [birth year]   Medicare Important Message Given:  Yes     Tommy Medal 12/05/2020, 2:19 PM

## 2020-12-05 NOTE — Care Management Obs Status (Signed)
[facility] NOTIFICATION   Patient Details  Name: Kristina Gibbs MRN: [id number] Date of Birth: 03/12/1946   Medicare Observation Status Notification Given:  Yes    Tommy Medal 12/05/2020, 2:19 PM

## 2020-12-05 NOTE — Consult Note (Addendum)
Cardiology Consultation:   Patient ID: [name] MRN: [id number]; DOB: January 20, 1946  Admit date: 12/04/2020 Date of Consult: 12/05/2020  PCP:  [name], [place]  Cardiologist:  Quay Burow, MD 11/25/2020 Advanced Practice Provider:  No care team member to display Electrophysiologist:  None        Patient Profile:   Kristina Gibbs is a 75 y.o. 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  History of Present Illness:   [name] gets occasional chest pain. First she will get weak. Sometimes will feel her heart racing, but not all the time. Then she will get chest pain. The pain is relieved by SL ntg. She can go weeks without sx, but feels the episodes are coming closer together.  She tries to stay busy. She walks the dog around the building, tries to do 3 laps in the morning, 2 laps in the evening.  She has started getting chest pain with the walking, not all the time. She feels the episodes are not very consistent.  She was sitting yesterday and started sweating and got a hard pain in her chest. She felt her heart racing. She took nitro x 3, sx improved but did not resolve. 5/10 at first, stopped after nitro. She was short of breath with this and weak. The heart racing continued for a while after the nitro, but finally improved.  Her husband called EMS, they brought her to [facility]. No recurrence of pain since then. However, she still feels weak.   Past Medical History:  Diagnosis Date  . Allergy   . Arthritis   . Cancer ([place])    ovarian  . Coronary artery disease   . Heart murmur   . Hypercholesteremia   . Hypertension   . 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 Osteopenia    . Stomach problems   . 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 Superficial basal cell carcinoma (BCC) 03/30/1999   Left Thigh (treatment)  . Unstable angina ([place]) 05/27/2014    Past Surgical History:  Procedure Laterality Date  . ABDOMINAL HYSTERECTOMY    . CARDIOVERSION N/A 01/25/2018   Procedure: CARDIOVERSION;  Surgeon: Acie Fredrickson Wonda Cheng, MD;  Location: [facility];  Service: Cardiovascular;  Laterality: N/A;  . CHOLECYSTECTOMY    . CORONARY ARTERY BYPASS GRAFT    . EYE SURGERY     bilateral cataract extraction  . HAND SURGERY Left    injurred hand at work- Programmer, applications  . hysterectomy    . LEFT HEART CATHETERIZATION WITH CORONARY ANGIOGRAM N/A 05/27/2014   Procedure: LEFT HEART CATHETERIZATION WITH CORONARY ANGIOGRAM;  Surgeon: [name], MD;  Location: [facility] CATH LAB;  Service: Cardiovascular;  Laterality: N/A;  . PCTA     x7      Home Medications:  Prior to Admission medications   Medication Sig Start Date End Date Taking? Authorizing Provider  alendronate (FOSAMAX) 70 MG tablet TAKE 1 TABLET (70 MG TOTAL) BY MOUTH EVERY 7 (SEVEN) DAYS. TAKE WITH A FULL GLASS OF WATER ON AN EMPTY STOMACH. DO NOT LIE DOWN FOR 1 HOUR AFTER TAKING. 09/16/20  Yes Ronnie Doss M, DO  amLODipine (NORVASC) 5 MG tablet TAKE 1 TABLET BY MOUTH EVERY DAY Patient taking differently: Take 5 mg by mouth daily. 09/16/20  Yes Gottschalk, Leatrice Jewels M, DO  apixaban (ELIQUIS) 5 MG TABS tablet Take 1 tablet (5 mg total) by mouth 2 (two) times daily. 02/28/20  Yes [name], MD  aspirin EC 81 MG tablet Take 81 mg by mouth at bedtime.    Yes [provider]  b complex vitamins tablet Take 1 tablet by mouth daily.    Yes [provider]  cholecalciferol (VITAMIN D) 1000 UNITS tablet Take 1,000 Units by mouth daily.   Yes [provider]  CO ENZYME Q-10 PO Take 1 capsule by mouth daily.    Yes [provider]  irbesartan (AVAPRO) 75 MG tablet  TAKE 1 TABLET (75 MG TOTAL) BY MOUTH AT BEDTIME. Patient taking differently: Take 75 mg by mouth daily. 12/03/19  Yes Gottschalk, Ashly M, DO  memantine (NAMENDA) 10 MG tablet TAKE 1 TABLET BY MOUTH TWICE A DAY Patient taking differently: Take 10 mg by mouth 2 (two) times daily. 09/29/20  Yes Gottschalk, Leatrice Jewels M, DO  metoprolol succinate (TOPROL-XL) 25 MG 24 hr tablet TAKE 1/2 TABLET BY MOUTH EVERY DAY Patient taking differently: Take 12.5 mg by mouth daily. 09/16/20  Yes Gottschalk, Leatrice Jewels M, DO  omega-3 acid ethyl esters (LOVAZA) 1 g capsule TAKE 1 CAPSULE (1 G TOTAL) BY MOUTH 4 (FOUR) TIMES DAILY. 09/16/20  Yes [name], MD  pantoprazole (PROTONIX) 40 MG tablet TAKE 1 TABLET BY MOUTH EVERY DAY Patient taking differently: Take 40 mg by mouth daily. 11/15/19  Yes [name], MD  PATADAY 0.2 % SOLN Place 1 drop into both eyes 2 (two) times daily. 08/22/14  Yes [provider]  RANEXA 500 MG 12 hr tablet TAKE 1 TABLET BY MOUTH TWICE A DAY Patient taking differently: Take 500 mg by mouth 2 (two) times daily. 05/29/20  Yes [name], MD  rosuvastatin (CRESTOR) 20 MG tablet Take 1 tablet (20 mg total) by mouth daily. 11/25/20 02/23/21 Yes [name], MD  vitamin C (ASCORBIC ACID) 500 MG tablet Take 1,000 mg by mouth daily.    Yes [provider]  nitroGLYCERIN (NITROLINGUAL) 0.4 MG/SPRAY spray PLACE 1 SPRAY UNDER THE TONGUE EVERY 5 (FIVE) MINUTES AS NEEDED FOR CHEST PAIN. 11/16/19   [name], DO    Inpatient Medications: Scheduled Meds: . alum & mag hydroxide-simeth  30 mL Oral Once   And  . lidocaine  15 mL Oral Once  . amLODipine  5 mg Oral Daily  . apixaban  5 mg Oral BID  . aspirin EC  81 mg Oral QHS  . calcium carbonate  625 mg Oral Q breakfast  . celecoxib  200 mg Oral BID  . cholecalciferol  1,000 Units Oral Daily  . irbesartan  75 mg Oral QHS  . memantine  10 mg Oral BID  . metoprolol succinate  12.5 mg Oral Daily  . pantoprazole   40 mg Oral BID  . ranolazine  500 mg Oral BID  . rosuvastatin  20 mg Oral Daily   Continuous Infusions: . sodium chloride 10 mL/hr at 12/04/20 2218   PRN Meds: acetaminophen, nitroGLYCERIN, ondansetron (ZOFRAN) IV  Allergies:    Allergies  Allergen Reactions  . Codeine Nausea And Vomiting  . Hydrocortisone Itching  . Lactose Intolerance (Gi) Nausea And Vomiting and Nausea Only    Social  History:   Social History   Socioeconomic History  . Marital status: Married    Spouse name: [name]   . Number of children: 3  . Years of education: 21  . Highest education level: High school graduate  Occupational History  . Occupation: Retired  Tobacco Use  . Smoking status: Former Smoker    Packs/day: 0.50    Years: 49.00    Pack years: 24.50    Types: Cigarettes    Quit date: 10/04/2008    Years since quitting: 12.1  . Smokeless tobacco: Never Used  Vaping Use  . Vaping Use: Never used  Substance and Sexual Activity  . Alcohol use: Yes    Alcohol/week: 1.0 standard drink    Types: 1 Glasses of wine per week    Comment: red wine once per month  . Drug use: No  . Sexual activity: Not Currently    Birth control/protection: Surgical  Other Topics Concern  . Not on file  Social History Narrative   Lives w/ husband   Caffeine use: 3 cups coffee/day   Social Determinants of Health   Financial Resource Strain: Not on file  Food Insecurity: Not on file  Transportation Needs: Not on file  Physical Activity: Not on file  Stress: Not on file  Social Connections: Not on file  Intimate Partner Violence: Not on file    Family History:    Family History  Problem Relation Age of Onset  . Stroke Mother   . Diabetes Mother   . Kidney disease Mother   . Hypertension Mother   . Hyperlipidemia Mother   . Dementia Mother   . Heart disease Daughter        heart attack x2  . Heart disease Brother        2 heart attacks  . Breast cancer Maternal Aunt   . Colon cancer Neg Hx   .  Stomach cancer Neg Hx   . Esophageal cancer Neg Hx   . Rectal cancer Neg Hx   . Liver cancer Neg Hx    Family Status  Relation Name Status  . Mother  Deceased at age 37  . Daughter Pharmacologist  . Brother  Alive  . Son bruce Deceased at age 44       shooting  . Son lee Alive  . Sister  Deceased at age 59 months old  . Mat Aunt  (Not Specified)  . Neg Hx  (Not Specified)      ROS:  Please see the history of present illness.  All other ROS reviewed and negative.     Physical Exam/Data:   Vitals:   12/04/20 2139 12/05/20 0211 12/05/20 0631 12/05/20 0826  BP:  (!) 107/53 128/60 (!) 147/91  Pulse:  79 87 (!) 102  Resp:  16 16   Temp:  97.6 F (36.4 C) (!) 97.4 F (36.3 C)   TempSrc:  Oral Oral   SpO2:  97% 99%   Weight: 66.3 kg     Height: 5\' 2"  (1.575 m)       Intake/Output Summary (Last 24 hours) at 12/05/2020 0910 Last data filed at 12/05/2020 0500 Gross per 24 hour  Intake 534.33 ml  Output --  Net 534.33 ml   Last 3 Weights 12/04/2020 12/04/2020 11/25/2020  Weight (lbs) 146 lb 2.6 oz 147 lb 147 lb  Weight (kg) 66.3 kg 66.679 kg 66.679 kg     Body mass index is 26.73 kg/m.  General:  Well  nourished, well developed, elderly female in no acute distress HEENT: normal Lymph: no adenopathy Neck: no JVD Endocrine:  No thryomegaly Vascular: No carotid bruits; FA pulses 2+ bilaterally without bruits  Cardiac:  normal S1, S2; Irreg R&R; no murmur  Lungs:  clear to auscultation bilaterally, no wheezing, rhonchi or rales  Abd: soft, nontender, no hepatomegaly  Ext: no edema Musculoskeletal:  No deformities, BUE and BLE strength normal and equal Skin: warm and dry  Neuro:  CNs 2-12 intact, no focal abnormalities noted Psych:  Normal affect   EKG:  The EKG was personally reviewed and demonstrates:  Atrial fib, RVR, HR 108, no acute ischemic changes Telemetry:  Telemetry was personally reviewed and demonstrates:  Atrial fib, RVR at times but generally controlled 3 bt run  NSVT  Relevant CV Studies:  ECHO: Ordered ECHO: 01/2016  - Left ventricle: The cavity size was normal. Wall thickness was  normal. Systolic function was normal. The estimated ejection  fraction was in the range of 55% to 60%. Wall motion was normal;  there were no regional wall motion abnormalities. Features are  consistent with a pseudonormal left ventricular filling pattern,  with concomitant abnormal relaxation and increased filling  pressure (grade 2 diastolic dysfunction).  - Aortic valve: Trileaflet; mildly thickened, mildly calcified  leaflets.  - Left atrium: The atrium was mildly dilated.  - Pulmonary arteries: Systolic pressure was mildly increased. PA  peak pressure: 39 mm Hg (S).   Impressions:   - Compared to the prior study, there has been no significant  interval change.   MYOVIEW:  03/06/2017  There was no ST segment deviation noted during stress.  No T wave inversion was noted during stress.  The study is normal.  This is a low risk study.  Nuclear stress EF: 61%.   Low risk stress nuclear study with normal perfusion and normal left ventricular regional and global systolic function. Previous study described anterior ischemia, not seen on the current study.   CARDIAC CATH: 05/27/2014 ANGIOGRAPHIC RESULTS:   1. Left main; 50-60% ostial  2. LAD; minor irregularities with competitive flow distally secondary to internal mammary artery graft flow 3. Left circumflex; 30% segmental proximal, nondominant vessel. There was a 75% focal lesion in the mid OM 1 unchanged from prior cath.  4. Right coronary artery; dominant with a widely patent mid RCA stent. 5.LIMA TO LAD; widely patent 6. SVG TO distal RCA was occluded at the aorta demonstrated from prior cath     SVG TO circumflex obtuse marginal Tamar Miano was occluded at the aorta demonstrated a prior cath 7. Left ventriculography; RAO left ventriculogram was performed using  25 mL of  Visipaque dye at 12 mL/second. The overall LVEF estimated  60 %  Without wall motion abnormalities  IMPRESSION:Ms. Musselman has a patent LIMA to the LAD and otherwise anatomy unchanged from her prior cath December 2010. There are no "culprit lesions". She has normal left ventricular function. Continue medical therapy will be recommended. The sheath was removed and pressure was held on the groin to achieve hemostasis. The patient left the lab in stable condition. She will remain recumbent for 4 hours and gently hydrated after which she'll be discharged home and seen back in the office in several weeks for followup.  Laboratory Data:  High Sensitivity Troponin:   Recent Labs  Lab 12/04/20 1557 12/04/20 1740  TROPONINIHS 3 3     Chemistry Recent Labs  Lab 12/04/20 1557  NA 131*  K 4.3  CL  98  CO2 25  GLUCOSE 92  BUN 18  CREATININE 1.19*  CALCIUM 8.5*  GFRNONAA 47*  ANIONGAP 8    Recent Labs  Lab 12/04/20 1557  PROT 6.6  ALBUMIN 3.4*  AST 23  ALT 26  ALKPHOS 52  BILITOT 0.9   Hematology Recent Labs  Lab 12/04/20 1557  WBC 4.7  RBC 3.99  HGB 11.3*  HCT 34.8*  MCV 87.2  MCH 28.3  MCHC 32.5  RDW 13.5  PLT 240   BNPNo results for input(s): BNP, PROBNP in the last 168 hours.  DDimer No results for input(s): DDIMER in the last 168 hours. Lab Results  Component Value Date   TSH 2.750 05/02/2020   Lab Results  Component Value Date   HGBA1C 5.5 05/26/2014   Lab Results  Component Value Date   CHOL 129 12/05/2020   HDL 73 12/05/2020   LDLCALC 49 12/05/2020   TRIG 36 12/05/2020   CHOLHDL 1.8 12/05/2020     Radiology/Studies:  DG Chest 2 View  Result Date: 12/04/2020 CLINICAL DATA:  Left-sided chest pain today, short of breath, atrial fibrillation EXAM: CHEST - 2 VIEW COMPARISON:  03/05/2017 FINDINGS: Frontal and lateral views of the chest demonstrates stable postsurgical changes from median sternotomy and CABG. Cardiac silhouette is stable. There is chronic  central vascular congestion without airspace disease. Small right pleural effusion. No pneumothorax. IMPRESSION: 1. Central vascular congestion without overt edema. 2. Trace right pleural effusion. Electronically Signed   By: Randa Ngo M.D.   On: 12/04/2020 16:25     Assessment and Plan:   1. Chest pain:  - in the setting of known CAD - ez neg MI, sx resolved w/ nitro - at cath in 2015 RCA ok, LIMA ok, 75% OM treated medically - suspect rapid Afib plays a role, palpitations precede most episodes of CP - last MV 2018 low risk - discuss ischemic eval w/ MD  - on Ranexa 500 mg bid, possibly could increase that  2. Longstanding persistent atrial fib - rate controlled at rest, but spikes seen, likely related to activity - On Toprol XL 12.5 mg qd - no bradycardia seen - if decrease or d/c amlodipine/losartan, could increase BB  3. HTN - on home rx of amlodipine 5 mg qd, irbesartan 75 mg qhs, and Toprol XL 12.5 mg qd - lowest BP since admit was 107/53, that was after she was given metoprolol 2.5 mg IV (no nursing notes, probably for HR control)   Risk Assessment/Risk Scores:     HEAR Score (for undifferentiated chest pain):  HEAR Score: 6    CHA2DS2-VASc Score = 5  This indicates a 7.2% annual risk of stroke. The patient's score is based upon: CHF History: No HTN History: Yes Diabetes History: No Stroke History: No Vascular Disease History: Yes Age Score: 2 Gender Score: 1         For questions or updates, please contact [place] Please consult [email or web address] for contact info under    Signed, Rosaria Ferries, PA-C  12/05/2020 9:10 AM  Attending Note Patient seen and discussed with PA Barrett, I agree with her documentation. Extensive history of CAD as reported above, afib, HTN, HL presents with chest pain. Reports a dull left sided pain associated with palpitations, nausea. Can occur at rest or with exertion. Typically lasts a few secodns, can improve with SL  NG.    K 4.3 Cr 1.19 BUN 18 WBC 4.7 Hgb 11.3 Plt 240  hstrop 3-->3 COVID neg  CXR central congestion EKG afib, no acute ischemic changes Echo pending 03/2017 nuclear stress: no ischemia  Extensive CAD history presents with chest pains and palpitations. Uncontrolled afib, would work on controlling her rates and following symptoms. At this time no objective evidence of ischemia by ekg or enzymes, f/u echo. Symptoms could be primarily afib, or perhaps combined afib with elevated rates and chronic obstructive CAD. If ongoign symptoms with better rate control would consider ischemic testing at that time, last nuclear stress 2018 without ischemia. Would change her to lopressor 25mg  tid and monitor symptoms, can consolidate back to toprol closer to discharge. Hold ARB for now as we titrate beta blocker   Carlyle Dolly MD

## 2020-12-05 NOTE — Progress Notes (Signed)
*  PRELIMINARY RESULTS* Echocardiogram 2D Echocardiogram has been performed.  Kristina Gibbs 12/05/2020, 3:52 PM

## 2020-12-06 DIAGNOSIS — E785 Hyperlipidemia, unspecified: Secondary | ICD-10-CM

## 2020-12-06 DIAGNOSIS — I10 Essential (primary) hypertension: Secondary | ICD-10-CM | POA: Diagnosis not present

## 2020-12-06 DIAGNOSIS — R0789 Other chest pain: Secondary | ICD-10-CM | POA: Diagnosis not present

## 2020-12-06 DIAGNOSIS — K219 Gastro-esophageal reflux disease without esophagitis: Secondary | ICD-10-CM | POA: Diagnosis not present

## 2020-12-06 DIAGNOSIS — R413 Other amnesia: Secondary | ICD-10-CM | POA: Diagnosis not present

## 2020-12-06 DIAGNOSIS — I4819 Other persistent atrial fibrillation: Secondary | ICD-10-CM | POA: Diagnosis not present

## 2020-12-06 MED ORDER — METOPROLOL SUCCINATE ER 25 MG PO TB24: 75.0000 mg | ORAL_TABLET | Freq: Every day | ORAL | 3 refills | Status: DC

## 2020-12-06 MED ORDER — PANTOPRAZOLE SODIUM 40 MG PO TBEC: 40.0000 mg | DELAYED_RELEASE_TABLET | Freq: Two times a day (BID) | ORAL | 3 refills | Status: DC

## 2020-12-06 MED ORDER — CALCIUM CARBONATE 600 MG PO TABS: 600.0000 mg | ORAL_TABLET | Freq: Every day | ORAL | 3 refills | Status: DC

## 2020-12-06 MED ORDER — AMLODIPINE BESYLATE 2.5 MG PO TABS: 2.5000 mg | ORAL_TABLET | Freq: Every day | ORAL | 3 refills | Status: DC

## 2020-12-06 NOTE — Progress Notes (Signed)
Patient ambulated to nurses station looking for her husband.

## 2020-12-06 NOTE — Discharge Summary (Signed)
Physician Discharge Summary  Kristina Gibbs HWE:[id number] DOB: Oct 15, 1945 DOA: 12/04/2020  PCP: [name], DO  Admit date: 12/04/2020 Discharge date: 12/06/2020  Time spent: 35 minutes  Recommendations for Outpatient Follow-up:  Repeat basic metabolic panel to follow electrolytes and creatinine trend. Reassess Blood pressure and adjust antihypertensive regimen as indicated Outpatient follow-up with cardiology service as instructed  Discharge Diagnoses:  Active Problems:   GERD   Dyslipidemia, goal LDL below 70   Memory impairment   Essential hypertension   Persistent atrial fibrillation   Chest pain   Discharge Condition: Stable and improved.  Discharged home with instruction to follow-up with PCP and cardiology as an outpatient.  CODE STATUS: Full code  Diet recommendation: heart healthy/low sodium diet.  Filed Weights   12/04/20 1553 12/04/20 2139  Weight: 66.7 kg 66.3 kg    History of present illness:  As per H&P written by Dr. 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 She last saw Dr. Adora Fridge 11/25/20 and was stable. She reports a week long h/o intermittent chest pain described as sharp under left breast. Today she had another episode of chest painthat was sharp located under the left breast w/o radiation, diaphoresis, SOBfor which she took NTG x 2 with relief at 30 min. She presents to [facility] for evaluation. At presentation she was pain free.   Hospital Course:  1-Chest pain -In the setting of known coronary artery disease -Negative troponin x2 -Currently chest pain-free and no SOB. -Patient symptoms precipitated at the same time of experiencing palpitations; per cardiology recommendations, concerns of rapid uncontrolled  a. Fib precipitating symptoms. -patient discharged on adjusted dose of metoprolol as per cardiology recommendations.  -continue ranexa and continue eliquis. -2-D echo, with preserved EF, no wall motion abnormalities and not significant valvular disorder. -outpatient follow up with cardiology will be arranged.   2-GERD -continue PPI  3-Dyslipidemia, goal LDL below 70 -continue statins -heart healthy diet encouraged   4-Memory impairment -continue supportive care  5-Essential hypertension -soft, but stable -will continue adjusted dose of amlodipine and continue holding ARB at discharge -continue low sodium diet  6-hx of persistent A. Fib -continue eliquis -follow cardiology recommendations for adjusting metoprolol doses. -CHADSVASC score 7.2  Procedures:  2-D echo: 1. Left ventricular ejection fraction, by estimation, is approximately  55%. The left ventricle has normal function. The left ventricle has no  regional wall motion abnormalities. Left ventricular diastolic parameters  are indeterminate.  2. Right ventricular systolic function is low normal. The right  ventricular size is mildly enlarged. There is mildly elevated pulmonary  artery systolic pressure. The estimated right ventricular systolic  pressure is 89.3 mmHg.  3. Left atrial size was mild to moderately dilated.  4. Right atrial size was mildly dilated.  5. The mitral valve is grossly normal. Mild mitral valve regurgitation.  6. Tricuspid valve regurgitation is moderate.  7. The aortic valve is tricuspid. Aortic valve regurgitation is not  visualized.  8. The inferior vena cava is normal in size with greater than 50%  respiratory variability, suggesting right atrial pressure of 3 mmHg.   Consultations:  Cardiology   Discharge Exam: Vitals:   12/06/20 0517 12/06/20 0903  BP: 108/71 124/71  Pulse: 73 85  Resp: 18   Temp: 97.9 F (36.6 C)  SpO2: 98%     General: Afebrile, pleasantly  confused; no chest pain, no nausea, no vomiting.  No palpitations. Cardiovascular: Rate controlled, no gallops, no gallops, no JVD.  S1-S2 appreciated Respiratory: Clear to auscultation bilaterally.  No using accessory muscles.  Good saturation on room air. Abdomen: Soft, nontender, distended, positive bowel sounds Extremities: No cyanosis or clubbing.  Discharge Instructions   Discharge Instructions    Diet - low sodium heart healthy   Complete by: As directed    Discharge instructions   Complete by: As directed    Take medications as prescribed  Follow heart healthy diet Check your weight on daily basis Follow-up with cardiology service as instructed Follow-up with PCP in 10 days. Maintain adequate hydration.   Increase activity slowly   Complete by: As directed      Allergies as of 12/06/2020      Reactions   Codeine Nausea And Vomiting   Hydrocortisone Itching   Lactose Intolerance (gi) Nausea And Vomiting, Nausea Only      Medication List    STOP taking these medications   irbesartan 75 MG tablet Commonly known as: AVAPRO     TAKE these medications   alendronate 70 MG tablet Commonly known as: FOSAMAX TAKE 1 TABLET (70 MG TOTAL) BY MOUTH EVERY 7 (SEVEN) DAYS. TAKE WITH A FULL GLASS OF WATER ON AN EMPTY STOMACH. DO NOT LIE DOWN FOR 1 HOUR AFTER TAKING.   amLODipine 2.5 MG tablet Commonly known as: NORVASC Take 1 tablet (2.5 mg total) by mouth daily. TAKE 1 TABLET BY MOUTH EVERY DAY What changed:   medication strength  how much to take  how to take this  when to take this   apixaban 5 MG Tabs tablet Commonly known as: Eliquis Take 1 tablet (5 mg total) by mouth 2 (two) times daily.   aspirin EC 81 MG tablet Take 81 mg by mouth at bedtime.   b complex vitamins tablet Take 1 tablet by mouth daily.   calcium carbonate 600 MG Tabs tablet Commonly known as: OS-CAL Take 1 tablet (600 mg total) by mouth daily.   cholecalciferol 1000 units  tablet Commonly known as: VITAMIN D Take 1,000 Units by mouth daily.   CO ENZYME Q-10 PO Take 1 capsule by mouth daily.   memantine 10 MG tablet Commonly known as: NAMENDA TAKE 1 TABLET BY MOUTH TWICE A DAY   metoprolol succinate 25 MG 24 hr tablet Commonly known as: TOPROL-XL Take 3 tablets (75 mg total) by mouth daily. What changed: how much to take   nitroGLYCERIN 0.4 MG/SPRAY spray Commonly known as: NITROLINGUAL PLACE 1 SPRAY UNDER THE TONGUE EVERY 5 (FIVE) MINUTES AS NEEDED FOR CHEST PAIN.   omega-3 acid ethyl esters 1 g capsule Commonly known as: LOVAZA TAKE 1 CAPSULE (1 G TOTAL) BY MOUTH 4 (FOUR) TIMES DAILY.   pantoprazole 40 MG tablet Commonly known as: PROTONIX Take 1 tablet (40 mg total) by mouth 2 (two) times daily. What changed: when to take this   Pataday 0.2 % Soln Generic drug: Olopatadine HCl Place 1 drop into both eyes 2 (two) times daily.   Ranexa 500 MG 12 hr tablet Generic drug: ranolazine TAKE 1 TABLET BY MOUTH TWICE A DAY What changed: how much to take   rosuvastatin 20 MG tablet Commonly known as: CRESTOR Take 1 tablet (20 mg total) by mouth daily.   vitamin C 500 MG tablet Commonly known as: ASCORBIC ACID Take 1,000 mg by mouth daily.  Allergies  Allergen Reactions  . Codeine Nausea And Vomiting  . Hydrocortisone Itching  . Lactose Intolerance (Gi) Nausea And Vomiting and Nausea Only    Follow-up Information    Ronnie Doss M, DO. Schedule an appointment as soon as possible for a visit in 10 day(s).   Specialty: Family Medicine Contact information: [place] [address] [phone]        [name], MD .   Specialties: Cardiology, Radiology Contact information: [address] [place] [address] [phone]               The results of significant diagnostics from this hospitalization (including imaging, microbiology, ancillary and laboratory) are listed below for  reference.    Significant Diagnostic Studies: DG Chest 2 View  Result Date: 12/04/2020 CLINICAL DATA:  Left-sided chest pain today, short of breath, atrial fibrillation EXAM: CHEST - 2 VIEW COMPARISON:  03/05/2017 FINDINGS: Frontal and lateral views of the chest demonstrates stable postsurgical changes from median sternotomy and CABG. Cardiac silhouette is stable. There is chronic central vascular congestion without airspace disease. Small right pleural effusion. No pneumothorax. IMPRESSION: 1. Central vascular congestion without overt edema. 2. Trace right pleural effusion. Electronically Signed   By: Randa Ngo M.D.   On: 12/04/2020 16:25   ECHOCARDIOGRAM COMPLETE  Result Date: 12/05/2020    ECHOCARDIOGRAM REPORT   Patient Name:   Kristina Gibbs Date of Exam: 12/05/2020 Medical Rec #:  [id number]           Height:       62.0 in Accession #:    [id number]          Weight:       146.2 lb Date of Birth:  Jan 05, 1946          BSA:          1.673 m Patient Age:    75 years            BP:           125/77 mmHg Patient Gender: F                   HR:           81 bpm. Exam Location:  [facility] Procedure: 2D Echo, Cardiac Doppler and Color Doppler Indications:    Chest Pain R07.9  History:        Patient has prior history of Echocardiogram examinations, most                 recent 01/23/2016. CAD, Prior CABG, Arrythmias:Persistent atrial                 fibrillation, Signs/Symptoms:Murmur; Risk Factors:Hypertension                 and Dyslipidemia. S/P percutaneous coronary angioplasty, Chronic                 anticoagulation.  Sonographer:    Alvino Chapel RCS Referring Phys: [place]  1. Left ventricular ejection fraction, by estimation, is approximately 55%. The left ventricle has normal function. The left ventricle has no regional wall motion abnormalities. Left ventricular diastolic parameters are indeterminate.  2. Right ventricular systolic function is low normal. The right  ventricular size is mildly enlarged. There is mildly elevated pulmonary artery systolic pressure. The estimated right ventricular systolic pressure is 79.0 mmHg.  3. Left atrial size was mild to moderately dilated.  4. Right atrial size was mildly dilated.  5. The mitral valve is grossly normal. Mild mitral valve regurgitation.  6. Tricuspid valve regurgitation is moderate.  7. The aortic valve is tricuspid. Aortic valve regurgitation is not visualized.  8. The inferior vena cava is normal in size with greater than 50% respiratory variability, suggesting right atrial pressure of 3 mmHg. FINDINGS  Left Ventricle: Left ventricular ejection fraction, by estimation, is 55%. The left ventricle has normal function. The left ventricle has no regional wall motion abnormalities. The left ventricular internal cavity size was normal in size. There is no left ventricular hypertrophy. Left ventricular diastolic parameters are indeterminate. Right Ventricle: The right ventricular size is mildly enlarged. No increase in right ventricular wall thickness. Right ventricular systolic function is low normal. There is mildly elevated pulmonary artery systolic pressure. The tricuspid regurgitant velocity is 2.92 m/s, and with an assumed right atrial pressure of 3 mmHg, the estimated right ventricular systolic pressure is 38.3 mmHg. Left Atrium: Left atrial size was mild to moderately dilated. Right Atrium: Right atrial size was mildly dilated. Pericardium: There is no evidence of pericardial effusion. Mitral Valve: The mitral valve is grossly normal. Mild mitral valve regurgitation. Tricuspid Valve: The tricuspid valve is grossly normal. Tricuspid valve regurgitation is moderate. Aortic Valve: The aortic valve is tricuspid. There is mild aortic valve annular calcification. Aortic valve regurgitation is not visualized. Pulmonic Valve: The pulmonic valve was grossly normal. Pulmonic valve regurgitation is trivial. Aorta: The aortic root is  normal in size and structure. Venous: The inferior vena cava is normal in size with greater than 50% respiratory variability, suggesting right atrial pressure of 3 mmHg. IAS/Shunts: No atrial level shunt detected by color flow Doppler.  LEFT VENTRICLE PLAX 2D LVIDd:         4.10 cm LVIDs:         2.90 cm LV PW:         0.90 cm LV IVS:        0.80 cm LVOT diam:     1.90 cm LV SV:         54 LV SV Index:   32 LVOT Area:     2.84 cm  RIGHT VENTRICLE RV S prime:     7.62 cm/s TAPSE (M-mode): 1.5 cm LEFT ATRIUM             Index       RIGHT ATRIUM           Index LA diam:        4.40 cm 2.63 cm/m  RA Area:     21.20 cm LA Vol (A2C):   64.4 ml 38.49 ml/m RA Volume:   62.80 ml  37.53 ml/m LA Vol (A4C):   68.4 ml 40.88 ml/m LA Biplane Vol: 68.4 ml 40.88 ml/m  AORTIC VALVE LVOT Vmax:   79.00 cm/s LVOT Vmean:  53.800 cm/s LVOT VTI:    0.191 m  AORTA Ao Root diam: 3.10 cm MITRAL VALVE                TRICUSPID VALVE MV Area (PHT): 4.96 cm     TR Peak grad:   34.1 mmHg MV Decel Time: 153 msec     TR Vmax:        292.00 cm/s MV E velocity: 112.00 cm/s  SHUNTS                             Systemic VTI:  0.19 m                             Systemic Diam: 1.90 cm Rozann Lesches MD Electronically signed by Rozann Lesches MD Signature Date/Time: 12/05/2020/4:24:48 PM    Final     Microbiology: Recent Results (from the past 240 hour(s))  Resp Panel by RT-PCR (Flu A&B, Covid) Nasopharyngeal Swab     Status: None   Collection Time: 12/04/20  8:14 PM   Specimen: Nasopharyngeal Swab; Nasopharyngeal(NP) swabs in vial transport medium  Result Value Ref Range Status   SARS Coronavirus 2 by RT PCR NEGATIVE NEGATIVE Final    Comment: (NOTE) SARS-CoV-2 target nucleic acids are NOT DETECTED.  The SARS-CoV-2 RNA is generally detectable in upper respiratory specimens during the acute phase of infection. The lowest concentration of SARS-CoV-2 viral copies this assay can detect is 138 copies/mL. A  negative result does not preclude SARS-Cov-2 infection and should not be used as the sole basis for treatment or other patient management decisions. A negative result may occur with  improper specimen collection/handling, submission of specimen other than nasopharyngeal swab, presence of viral mutation(s) within the areas targeted by this assay, and inadequate number of viral copies(<138 copies/mL). A negative result must be combined with clinical observations, patient history, and epidemiological information. The expected result is Negative.  Fact Sheet for Patients:  EntrepreneurPulse.com.au  Fact Sheet for Healthcare Providers:  IncredibleEmployment.be  This test is no t yet approved or cleared by the [place] FDA and  has been authorized for detection and/or diagnosis of SARS-CoV-2 by FDA under an Emergency Use Authorization (EUA). This EUA will remain  in effect (meaning this test can be used) for the duration of the COVID-19 declaration under Section 564(b)(1) of the Act, 21 U.S.C.section 360bbb-3(b)(1), unless the authorization is terminated  or revoked sooner.       Influenza A by PCR NEGATIVE NEGATIVE Final   Influenza B by PCR NEGATIVE NEGATIVE Final    Comment: (NOTE) The Xpert Xpress SARS-CoV-2/FLU/RSV plus assay is intended as an aid in the diagnosis of influenza from Nasopharyngeal swab specimens and should not be used as a sole basis for treatment. Nasal washings and aspirates are unacceptable for Xpert Xpress SARS-CoV-2/FLU/RSV testing.  Fact Sheet for Patients: EntrepreneurPulse.com.au  Fact Sheet for Healthcare Providers: IncredibleEmployment.be  This test is not yet approved or cleared by the [place] FDA and has been authorized for detection and/or diagnosis of SARS-CoV-2 by FDA under an Emergency Use Authorization (EUA). This EUA will remain in effect (meaning this test can  be used) for the duration of the COVID-19 declaration under Section 564(b)(1) of the Act, 21 U.S.C. section 360bbb-3(b)(1), unless the authorization is terminated or revoked.  Performed at [facility], [address], [place],  [address]      Labs: Basic Metabolic Panel: Recent Labs  Lab 12/04/20 1557  NA 131*  K 4.3  CL 98  CO2 25  GLUCOSE 92  BUN 18  CREATININE 1.19*  CALCIUM 8.5*   Liver Function Tests: Recent Labs  Lab 12/04/20 1557  AST 23  ALT 26  ALKPHOS 52  BILITOT 0.9  PROT 6.6  ALBUMIN 3.4*   CBC: Recent Labs  Lab 12/04/20 1557  WBC 4.7  HGB 11.3*  HCT 34.8*  MCV 87.2  PLT 240    Signed:  Barton Dubois MD.  [facility] 12/06/2020, 1:21 PM

## 2020-12-06 NOTE — Progress Notes (Signed)
Nsg Discharge Note  Admit Date:  12/04/2020 Discharge date: 12/06/2020   Kristina Gibbs [place] to be D/C'd Home per MD order.  AVS completed.  Copy for chart, and copy for patient signed, and dated. Patient/caregiver able to verbalize understanding.  Discharge Medication: Allergies as of 12/06/2020      Reactions   Codeine Nausea And Vomiting   Hydrocortisone Itching   Lactose Intolerance (gi) Nausea And Vomiting, Nausea Only      Medication List    STOP taking these medications   irbesartan 75 MG tablet Commonly known as: AVAPRO     TAKE these medications   alendronate 70 MG tablet Commonly known as: FOSAMAX TAKE 1 TABLET (70 MG TOTAL) BY MOUTH EVERY 7 (SEVEN) DAYS. TAKE WITH A FULL GLASS OF WATER ON AN EMPTY STOMACH. DO NOT LIE DOWN FOR 1 HOUR AFTER TAKING.   amLODipine 2.5 MG tablet Commonly known as: NORVASC Take 1 tablet (2.5 mg total) by mouth daily. TAKE 1 TABLET BY MOUTH EVERY DAY What changed:   medication strength  how much to take  how to take this  when to take this   apixaban 5 MG Tabs tablet Commonly known as: Eliquis Take 1 tablet (5 mg total) by mouth 2 (two) times daily.   aspirin EC 81 MG tablet Take 81 mg by mouth at bedtime.   b complex vitamins tablet Take 1 tablet by mouth daily.   calcium carbonate 600 MG Tabs tablet Commonly known as: OS-CAL Take 1 tablet (600 mg total) by mouth daily.   cholecalciferol 1000 units tablet Commonly known as: VITAMIN D Take 1,000 Units by mouth daily.   CO ENZYME Q-10 PO Take 1 capsule by mouth daily.   memantine 10 MG tablet Commonly known as: NAMENDA TAKE 1 TABLET BY MOUTH TWICE A DAY   metoprolol succinate 25 MG 24 hr tablet Commonly known as: TOPROL-XL Take 3 tablets (75 mg total) by mouth daily. What changed: how much to take   nitroGLYCERIN 0.4 MG/SPRAY spray Commonly known as: NITROLINGUAL PLACE 1 SPRAY UNDER THE TONGUE EVERY 5 (FIVE) MINUTES AS NEEDED FOR CHEST PAIN.   omega-3 acid ethyl  esters 1 g capsule Commonly known as: LOVAZA TAKE 1 CAPSULE (1 G TOTAL) BY MOUTH 4 (FOUR) TIMES DAILY.   pantoprazole 40 MG tablet Commonly known as: PROTONIX Take 1 tablet (40 mg total) by mouth 2 (two) times daily. What changed: when to take this   Pataday 0.2 % Soln Generic drug: Olopatadine HCl Place 1 drop into both eyes 2 (two) times daily.   Ranexa 500 MG 12 hr tablet Generic drug: ranolazine TAKE 1 TABLET BY MOUTH TWICE A DAY What changed: how much to take   rosuvastatin 20 MG tablet Commonly known as: CRESTOR Take 1 tablet (20 mg total) by mouth daily.   vitamin C 500 MG tablet Commonly known as: ASCORBIC ACID Take 1,000 mg by mouth daily.       Discharge Assessment: Vitals:   12/06/20 0517 12/06/20 0903  BP: 108/71 124/71  Pulse: 73 85  Resp: 18   Temp: 97.9 F (36.6 C)   SpO2: 98%    Skin clean, dry and intact without evidence of skin break down, no evidence of skin tears noted. IV catheter discontinued intact. Site without signs and symptoms of complications - no redness or edema noted at insertion site, patient denies c/o pain - only slight tenderness at site.  Dressing with slight pressure applied.  D/c Instructions-Education: Discharge instructions given  to patient/family with verbalized understanding. D/c education completed with patient/family including follow up instructions, medication list, d/c activities limitations if indicated, with other d/c instructions as indicated by MD - patient able to verbalize understanding, all questions fully answered. Patient instructed to return to ED, call 911, or call MD for any changes in condition.  Patient escorted via [place], and D/C home via private auto.  Bullins,Kristina Loletha Grayer, RN 12/06/2020 1:18 PM

## 2020-12-06 NOTE — Progress Notes (Signed)
Patient confused this morning. Found IV pulled out and laying by sink but patient stated she did not know what happened to it.

## 2020-12-08 ENCOUNTER — Telehealth: Payer: Self-pay | Admitting: Family Medicine

## 2020-12-08 NOTE — Telephone Encounter (Signed)
Contact Date: 12/08/2020 Contacted By: Bradley  Transition Care Management Follow-up Telephone Call  Date of discharge and from where: 12/06/2020  Discharge [facility]  How have you been since you were released from the hospital? good  Any questions or concerns? No   Items Reviewed:  Did the pt receive and understand the discharge instructions provided? Yes   Medications obtained and verified? Yes   Any new allergies since your discharge? No   Dietary orders reviewed? Yes  Do you have support at home? Yes   Discontinued Medications Avapro 75mg  New Medications Added Fosamox 70mg  and Norvasc 2.5mg    Current Medication List Allergies as of 12/08/2020      Reactions   Codeine Nausea And Vomiting   Hydrocortisone Itching   Lactose Intolerance (gi) Nausea And Vomiting, Nausea Only      Medication List       Accurate as of December 08, 2020  9:40 AM. If you have any questions, ask your nurse or doctor.        alendronate 70 MG tablet Commonly known as: FOSAMAX TAKE 1 TABLET (70 MG TOTAL) BY MOUTH EVERY 7 (SEVEN) DAYS. TAKE WITH A FULL GLASS OF WATER ON AN EMPTY STOMACH. DO NOT LIE DOWN FOR 1 HOUR AFTER TAKING.   amLODipine 2.5 MG tablet Commonly known as: NORVASC Take 1 tablet (2.5 mg total) by mouth daily. TAKE 1 TABLET BY MOUTH EVERY DAY   apixaban 5 MG Tabs tablet Commonly known as: Eliquis Take 1 tablet (5 mg total) by mouth 2 (two) times daily.   aspirin EC 81 MG tablet Take 81 mg by mouth at bedtime.   b complex vitamins tablet Take 1 tablet by mouth daily.   calcium carbonate 600 MG Tabs tablet Commonly known as: OS-CAL Take 1 tablet (600 mg total) by mouth daily.   cholecalciferol 1000 units tablet Commonly known as: VITAMIN D Take 1,000 Units by mouth daily.   CO ENZYME Q-10 PO Take 1 capsule by mouth daily.   memantine 10 MG tablet Commonly known as: NAMENDA TAKE 1 TABLET BY MOUTH TWICE A DAY   metoprolol succinate 25 MG 24 hr  tablet Commonly known as: TOPROL-XL Take 3 tablets (75 mg total) by mouth daily.   nitroGLYCERIN 0.4 MG/SPRAY spray Commonly known as: NITROLINGUAL PLACE 1 SPRAY UNDER THE TONGUE EVERY 5 (FIVE) MINUTES AS NEEDED FOR CHEST PAIN.   omega-3 acid ethyl esters 1 g capsule Commonly known as: LOVAZA TAKE 1 CAPSULE (1 G TOTAL) BY MOUTH 4 (FOUR) TIMES DAILY.   pantoprazole 40 MG tablet Commonly known as: PROTONIX Take 1 tablet (40 mg total) by mouth 2 (two) times daily.   Pataday 0.2 % Soln Generic drug: Olopatadine HCl Place 1 drop into both eyes 2 (two) times daily.   Ranexa 500 MG 12 hr tablet Generic drug: ranolazine TAKE 1 TABLET BY MOUTH TWICE A DAY What changed: how much to take   rosuvastatin 20 MG tablet Commonly known as: CRESTOR Take 1 tablet (20 mg total) by mouth daily.   vitamin C 500 MG tablet Commonly known as: ASCORBIC ACID Take 1,000 mg by mouth daily.        Home Care and Equipment/Supplies: Were home health services ordered? no If so, what is the name of the agency?   Has the agency set up a time to come to the patient's home? not applicable Were any new equipment or medical supplies ordered?  No What is the name of the medical supply  agency?  Were you able to get the supplies/equipment? not applicable Do you have any questions related to the use of the equipment or supplies? No  Functional Questionnaire: (I = Independent and D = Dependent) ADLs: I  Bathing/Dressing- I  Meal Prep- I  Eating- I   Maintaining continence- I   Transferring/Ambulation- I  Managing Meds- I  Follow up appointments reviewed:   PCP Hospital f/u appt confirmed? Yes  Scheduled to see Gott[place]alk  on 12/06/20 @ [facility] f/u appt confirmed? No  Scheduled to see  on  @ .  Are transportation arrangements needed? No   If their condition worsens, is the pt aware to call PCP or go to the Emergency Dept.? Yes  Was the patient provided with contact  information for the PCP's office or ED? Yes  Was to pt encouraged to call back with questions or concerns? Yes

## 2020-12-08 NOTE — Telephone Encounter (Signed)
Transition Care Management Unsuccessful Follow-up Telephone Call  Date of discharge and from where:  12/06/20 [facility] Attempts:  1st Attempt  Reason for unsuccessful TCM follow-up call:  Left voice message

## 2020-12-10 ENCOUNTER — Other Ambulatory Visit: Payer: Self-pay | Admitting: Family Medicine

## 2020-12-16 ENCOUNTER — Ambulatory Visit (INDEPENDENT_AMBULATORY_CARE_PROVIDER_SITE_OTHER): Payer: Medicare HMO

## 2020-12-16 ENCOUNTER — Ambulatory Visit (INDEPENDENT_AMBULATORY_CARE_PROVIDER_SITE_OTHER): Payer: Medicare HMO | Admitting: Family Medicine

## 2020-12-16 ENCOUNTER — Other Ambulatory Visit: Payer: Self-pay

## 2020-12-16 VITALS — BP 120/68 | HR 84 | Temp 97.4°F | Ht 62.0 in | Wt 145.0 lb

## 2020-12-16 DIAGNOSIS — E871 Hypo-osmolality and hyponatremia: Secondary | ICD-10-CM | POA: Diagnosis not present

## 2020-12-16 DIAGNOSIS — M8588 Other specified disorders of bone density and structure, other site: Secondary | ICD-10-CM | POA: Diagnosis not present

## 2020-12-16 DIAGNOSIS — Z780 Asymptomatic menopausal state: Secondary | ICD-10-CM | POA: Diagnosis not present

## 2020-12-16 DIAGNOSIS — R413 Other amnesia: Secondary | ICD-10-CM | POA: Diagnosis not present

## 2020-12-16 DIAGNOSIS — Z09 Encounter for follow-up examination after completed treatment for conditions other than malignant neoplasm: Secondary | ICD-10-CM | POA: Diagnosis not present

## 2020-12-16 DIAGNOSIS — I4819 Other persistent atrial fibrillation: Secondary | ICD-10-CM | POA: Diagnosis not present

## 2020-12-16 DIAGNOSIS — N1831 Chronic kidney disease, stage 3a: Secondary | ICD-10-CM | POA: Diagnosis not present

## 2020-12-16 DIAGNOSIS — M8580 Other specified disorders of bone density and structure, unspecified site: Secondary | ICD-10-CM

## 2020-12-16 NOTE — Progress Notes (Signed)
Subjective: CC: Hospital follow up  PCP: [name], DO IWP:[id number] L Kristina Gibbs is a 75 y.o. female presenting to clinic today for:  1. Angina Patient presented to [facility] on 12/04/2020 after experiencing chest pain and having been treated with nitro x2.  Cardiac work-up did not reveal acute ischemia.  Her beta-blocker was advanced slightly due to heart palpitations.  Her ARB was discontinued at discharge from hospital due to soft blood pressures.  She was continued on her Norvasc, Ranexa and Eliquis.  She was noted to be hyponatremic at time of discharge from hospital with sodium at 131.   She has been totally chest pain-free and palpitation free since her discharge.  She has not followed up with cardiology and is not sure if she supposed to.  Her husband was physically present for today's visit but he had to take care of some car issues during the visit time.  No hematochezia, melena or epistaxis.  She does have a large bruise on the left arm where she had her IV placed.  This seems to be getting better though.   ROS: Per HPI  Allergies  Allergen Reactions  . Codeine Nausea And Vomiting  . Hydrocortisone Itching  . Lactose Intolerance (Gi) Nausea And Vomiting and Nausea Only   Past Medical History:  Diagnosis Date  . Allergy   . Arthritis   . Cancer ([place])    ovarian  . Coronary artery disease   . Heart murmur   . Hypercholesteremia   . Hypertension   . 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 Osteopenia   . Stomach problems   . 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 Superficial basal cell carcinoma (BCC) 03/30/1999   Left Thigh (treatment)  . Unstable angina ([place]) 05/27/2014    Current Outpatient Medications:  .  alendronate (FOSAMAX) 70 MG tablet, TAKE 1 TABLET (70 MG TOTAL) BY MOUTH EVERY 7 (SEVEN) DAYS. TAKE WITH A FULL GLASS OF WATER ON AN EMPTY STOMACH. DO NOT LIE DOWN FOR 1 HOUR AFTER TAKING., Disp: 12 tablet, Rfl: 0 .   amLODipine (NORVASC) 2.5 MG tablet, Take 1 tablet (2.5 mg total) by mouth daily. TAKE 1 TABLET BY MOUTH EVERY DAY, Disp: 30 tablet, Rfl: 3 .  apixaban (ELIQUIS) 5 MG TABS tablet, Take 1 tablet (5 mg total) by mouth 2 (two) times daily., Disp: 180 tablet, Rfl: 2 .  aspirin EC 81 MG tablet, Take 81 mg by mouth at bedtime. , Disp: , Rfl:  .  b complex vitamins tablet, Take 1 tablet by mouth daily. , Disp: , Rfl:  .  calcium carbonate (OS-CAL) 600 MG TABS tablet, Take 1 tablet (600 mg total) by mouth daily., Disp: 30 tablet, Rfl: 3 .  cholecalciferol (VITAMIN D) 1000 UNITS tablet, Take 1,000 Units by mouth daily., Disp: , Rfl:  .  CO ENZYME Q-10 PO, Take 1 capsule by mouth daily. , Disp: , Rfl:  .  memantine (NAMENDA) 10 MG tablet, TAKE 1 TABLET BY MOUTH TWICE A DAY (Patient taking differently: Take 10 mg by mouth 2 (two) times daily.), Disp: 180 tablet, Rfl: 0 .  metoprolol succinate (TOPROL-XL) 25 MG 24 hr tablet, Take 3 tablets (75 mg total) by mouth daily., Disp: 90 tablet, Rfl: 3 .  nitroGLYCERIN (NITROLINGUAL) 0.4 MG/SPRAY spray, PLACE 1 SPRAY UNDER THE TONGUE EVERY 5 (FIVE) MINUTES AS NEEDED FOR CHEST PAIN., Disp: 12 g, Rfl: 1 .  omega-3 acid  ethyl esters (LOVAZA) 1 g capsule, TAKE 1 CAPSULE (1 G TOTAL) BY MOUTH 4 (FOUR) TIMES DAILY., Disp: 360 capsule, Rfl: 2 .  pantoprazole (PROTONIX) 40 MG tablet, Take 1 tablet (40 mg total) by mouth 2 (two) times daily., Disp: 60 tablet, Rfl: 3 .  PATADAY 0.2 % SOLN, Place 1 drop into both eyes 2 (two) times daily., Disp: , Rfl: 6 .  RANEXA 500 MG 12 hr tablet, TAKE 1 TABLET BY MOUTH TWICE A DAY (Patient taking differently: Take 500 mg by mouth 2 (two) times daily.), Disp: 180 tablet, Rfl: 2 .  rosuvastatin (CRESTOR) 20 MG tablet, Take 1 tablet (20 mg total) by mouth daily., Disp: 90 tablet, Rfl: 3 .  vitamin C (ASCORBIC ACID) 500 MG tablet, Take 1,000 mg by mouth daily. , Disp: , Rfl:  Social History   Socioeconomic History  . Marital status: Married     Spouse name: [name]   . Number of children: 3  . Years of education: 9  . Highest education level: High school graduate  Occupational History  . Occupation: Retired  Tobacco Use  . Smoking status: Former Smoker    Packs/day: 0.50    Years: 49.00    Pack years: 24.50    Types: Cigarettes    Quit date: 10/04/2008    Years since quitting: 12.2  . Smokeless tobacco: Never Used  Vaping Use  . Vaping Use: Never used  Substance and Sexual Activity  . Alcohol use: Yes    Alcohol/week: 1.0 standard drink    Types: 1 Glasses of wine per week    Comment: red wine once per month  . Drug use: No  . Sexual activity: Not Currently    Birth control/protection: Surgical  Other Topics Concern  . Not on file  Social History Narrative   Lives w/ husband   Caffeine use: 3 cups coffee/day   Social Determinants of Health   Financial Resource Strain: Not on file  Food Insecurity: Not on file  Transportation Needs: Not on file  Physical Activity: Not on file  Stress: Not on file  Social Connections: Not on file  Intimate Partner Violence: Not on file   Family History  Problem Relation Age of Onset  . Stroke Mother   . Diabetes Mother   . Kidney disease Mother   . Hypertension Mother   . Hyperlipidemia Mother   . Dementia Mother   . Heart disease Daughter        heart attack x2  . Heart disease Brother        2 heart attacks  . Breast cancer Maternal Aunt   . Colon cancer Neg Hx   . Stomach cancer Neg Hx   . Esophageal cancer Neg Hx   . Rectal cancer Neg Hx   . Liver cancer Neg Hx     Objective: Office vital signs reviewed. BP 120/68   Pulse 84   Temp (!) 97.4 F (36.3 C) (Temporal)   Ht 5\' 2"  (1.575 m)   Wt 145 lb (65.8 kg)   SpO2 98%   BMI 26.52 kg/m   Physical Examination:  General: Awake, alert, well nourished, No acute distress HEENT: Normal; no conjunctival pallor Cardio: Rate controlled irregularly irregular rhythm.  S1S2 heard, no murmurs appreciated Pulm:  clear to auscultation bilaterally, no wheezes, rhonchi or rales; normal work of breathing on room air Extremities: No edema MSK: Ambulating independently Skin: Large healing ecchymosis noted along the left dorsal forearm Neuro: Memory impairment.  See MMSE  MMSE - Mini Mental State Exam 12/16/2020 09/02/2020 11/12/2019  Not completed: - - -  Orientation to time 2 4 3   Orientation to Place 2 5 3   Registration 3 3 3   Attention/ Calculation 3 3 1   Recall 1 1 0  Language- name 2 objects 2 2 2   Language- repeat 1 1 0  Language- follow 3 step command 3 3 3   Language- follow 3 step command-comments - - -  Language- read & follow direction 1 1 1   Write a sentence 1 1 1   Copy design 1 1 1   Total score 20 25 18      Assessment/ Plan: 75 y.o. female   Hospital discharge follow-up  Persistent atrial fibrillation (HCC)  Hyponatremia - Plan: Basic Metabolic Panel  Stage 3a chronic kidney disease ([place]) - Plan: Basic Metabolic Panel  Memory impairment  Osteopenia with high risk of fracture - Plan: DG [facility] DEXA  I reviewed her hospital course, discharge summary and recommendations. She is a rate controlled atrial fibrillation here in office.  She does have a large ecchymosis noted along the left upper extremity but otherwise does not appear to be having any complications with Eliquis.  She was noted to be hyponatremic with sodium to 131 and therefore BMP was repeated  Memory stable.  Continue current regimen  Plan for DEXA scan today.  Patient with known high risk fracture osteopenia treated with Fosamax  No orders of the defined types were placed in this encounter.  No orders of the defined types were placed in this encounter.   Today's visit is for Transitional Care Management.  The patient was discharged from [facility] on 12/06/20 with a primary diagnosis of Angina.   Contact with the patient and/or caregiver, by a clinical staff member, was made on 12/08/2020 and was documented  as a telephone encounter within the EMR.  Through chart review and discussion with the patient I have determined that management of their condition is of moderate complexity.    [name], DO [place] [phone]

## 2020-12-16 NOTE — Patient Instructions (Addendum)
Ok to stay off irbesartan. Your blood pressure is normal today  The rest of the medications listed here are the things you should be taking daily.  Call Dr Kennon Holter office and see if they want to follow up with you after being in the hospital.  We are rechecking your sodium level (which was low)  You had your bone density test done today.  You had labs performed today.  You will be contacted with the results of the labs once they are available, usually in the next 3 business days for routine lab work.  If you have an active my chart account, they will be released to your MyChart.  If you prefer to have these labs released to you via telephone, please let us know.  If you had a pap smear or biopsy performed, expect to be contacted in about 7-10 days.

## 2020-12-17 ENCOUNTER — Telehealth: Payer: Self-pay

## 2020-12-17 ENCOUNTER — Encounter: Payer: Self-pay | Admitting: Family Medicine

## 2020-12-17 DIAGNOSIS — M810 Age-related osteoporosis without current pathological fracture: Secondary | ICD-10-CM | POA: Insufficient documentation

## 2020-12-17 LAB — BASIC METABOLIC PANEL
BUN/Creatinine Ratio: 14 (ref 12–28)
BUN: 15 mg/dL (ref 8–27)
CO2: 23 mmol/L (ref 20–29)
Calcium: 8.8 mg/dL (ref 8.7–10.3)
Chloride: 100 mmol/L (ref 96–106)
Creatinine, Ser: 1.06 mg/dL — ABNORMAL HIGH (ref 0.57–1.00)
Glucose: 102 mg/dL — ABNORMAL HIGH (ref 65–99)
Potassium: 4.6 mmol/L (ref 3.5–5.2)
Sodium: 137 mmol/L (ref 134–144)
eGFR: 54 mL/min/{1.73_m2} — ABNORMAL LOW (ref >59)

## 2020-12-17 NOTE — Telephone Encounter (Signed)
Called pt regarding eliquis pt assistance form. Per faxes received pt has not completed her part of the form and sent back to the foundation. Pt states that she forgot about the form and thanks me for calling to remind her. Pt states that she will get the form sent back ASAP.

## 2020-12-18 ENCOUNTER — Telehealth: Payer: Self-pay | Admitting: Cardiovascular Disease

## 2020-12-18 NOTE — Telephone Encounter (Signed)
Left message to call back  

## 2020-12-18 NOTE — Telephone Encounter (Signed)
New Message:     Pt's husband is calling. He said pt received forms from [facility] so pt can get help with her Eliquis. His question is, they would like to come in to get help with these forms and have the parts filled out that needs to be filled out by the doctor.

## 2020-12-19 ENCOUNTER — Ambulatory Visit: Payer: Medicare HMO | Admitting: Cardiovascular Disease

## 2020-12-19 ENCOUNTER — Other Ambulatory Visit: Payer: Self-pay

## 2020-12-19 ENCOUNTER — Encounter: Payer: Self-pay | Admitting: Cardiovascular Disease

## 2020-12-19 DIAGNOSIS — I10 Essential (primary) hypertension: Secondary | ICD-10-CM

## 2020-12-19 DIAGNOSIS — E785 Hyperlipidemia, unspecified: Secondary | ICD-10-CM

## 2020-12-19 DIAGNOSIS — I4819 Other persistent atrial fibrillation: Secondary | ICD-10-CM

## 2020-12-19 DIAGNOSIS — Z951 Presence of aortocoronary bypass graft: Secondary | ICD-10-CM | POA: Diagnosis not present

## 2020-12-19 MED ORDER — METOPROLOL SUCCINATE ER 50 MG PO TB24: 50.0000 mg | ORAL_TABLET | Freq: Two times a day (BID) | ORAL | 3 refills | Status: DC

## 2020-12-19 NOTE — Progress Notes (Signed)
12/19/2020 Kristina Gibbs   April 27, 1946  [id number]  Primary Physician [name], DO Primary Cardiologist: [name] MD FACP, [place], [place], [place]  HPI:  Kristina Gibbs is a 75 y.o.   mildly overweight Caucasian female with a history of CAD previously taken care of by Dr. Rollene Fare. I last saw her in the office 11/25/2020 . She has a history of anterior wall myocardial infarction in 1995 with subsequent percutaneous revascularization to her RCA and LAD. She ultimately requiredmultivessel bypass surgery by Dr. Roxan Hockey March of 2000 and LIMA to LAD, vein to a marginal branch of the circumflex and distal right coronary artery. Problems included hypertension and hyperlipidemia. I performed cardiac catheterization on herin 2010revealing occluded vein grafts, patent LIMA and normal LV function. Since I saw her in the office year ago she's remained medically stable with only a few episodes of nitrateresponsive chest pain.  She was recently hospitalized overnight for chest pain 03/05/17. She ruled out for myocardial infarction. She was seen in consultation by Dr. Staci Acosta a Myoview stress test that was performed. This showed no ischemic ischemia. She's had no recurrent chest pain. Since I saw her in the officeformonths ago she's remained currently stable.She was in A. fib with RVR when I saw her last and underwent DC cardioversion successfully to sinus rhythm. She is on Eliquis oral anticoagulation.  Since I saw her a month ago she was admitted to the hospital on 12/04/2020 with chest pain, A. fib with RVR.  She ruled out for myocardial infarction.  She was seen in consultation by Dr. Harl Bowie who felt that her chest pain was referred related to demand ischemia and suggested altering her beta-blocker.  Her husband does say that it is difficult to keep up with her medications given her progressive dementia.   Current Meds  Medication Sig  . alendronate  (FOSAMAX) 70 MG tablet TAKE 1 TABLET (70 MG TOTAL) BY MOUTH EVERY 7 (SEVEN) DAYS. TAKE WITH A FULL GLASS OF WATER ON AN EMPTY STOMACH. DO NOT LIE DOWN FOR 1 HOUR AFTER TAKING.  Marland Kitchen amLODipine (NORVASC) 2.5 MG tablet Take 1 tablet (2.5 mg total) by mouth daily. TAKE 1 TABLET BY MOUTH EVERY DAY  . apixaban (ELIQUIS) 5 MG TABS tablet Take 1 tablet (5 mg total) by mouth 2 (two) times daily.  Marland Kitchen aspirin EC 81 MG tablet Take 81 mg by mouth at bedtime.   Marland Kitchen b complex vitamins tablet Take 1 tablet by mouth daily.   . calcium carbonate (OS-CAL) 600 MG TABS tablet Take 1 tablet (600 mg total) by mouth daily.  . cholecalciferol (VITAMIN D) 1000 UNITS tablet Take 1,000 Units by mouth daily.  . CO ENZYME Q-10 PO Take 1 capsule by mouth daily.   . memantine (NAMENDA) 10 MG tablet TAKE 1 TABLET BY MOUTH TWICE A DAY (Patient taking differently: Take 10 mg by mouth 2 (two) times daily.)  . metoprolol succinate (TOPROL-XL) 25 MG 24 hr tablet Take 3 tablets (75 mg total) by mouth daily.  . nitroGLYCERIN (NITROLINGUAL) 0.4 MG/SPRAY spray PLACE 1 SPRAY UNDER THE TONGUE EVERY 5 (FIVE) MINUTES AS NEEDED FOR CHEST PAIN.  Marland Kitchen omega-3 acid ethyl esters (LOVAZA) 1 g capsule TAKE 1 CAPSULE (1 G TOTAL) BY MOUTH 4 (FOUR) TIMES DAILY.  . pantoprazole (PROTONIX) 40 MG tablet Take 1 tablet (40 mg total) by mouth 2 (two) times daily.  Marland Kitchen PATADAY 0.2 % SOLN Place 1 drop into both eyes 2 (two) times daily.  Marland Kitchen  RANEXA 500 MG 12 hr tablet TAKE 1 TABLET BY MOUTH TWICE A DAY (Patient taking differently: Take 500 mg by mouth 2 (two) times daily.)  . rosuvastatin (CRESTOR) 20 MG tablet Take 1 tablet (20 mg total) by mouth daily.  . vitamin C (ASCORBIC ACID) 500 MG tablet Take 1,000 mg by mouth daily.      Allergies  Allergen Reactions  . Codeine Nausea And Vomiting  . Hydrocortisone Itching  . Lactose Intolerance (Gi) Nausea And Vomiting and Nausea Only    Social History   Socioeconomic History  . Marital status: Married    Spouse  name: [name]   . Number of children: 3  . Years of education: 35  . Highest education level: High school graduate  Occupational History  . Occupation: Retired  Tobacco Use  . Smoking status: Former Smoker    Packs/day: 0.50    Years: 49.00    Pack years: 24.50    Types: Cigarettes    Quit date: 10/04/2008    Years since quitting: 12.2  . Smokeless tobacco: Never Used  Vaping Use  . Vaping Use: Never used  Substance and Sexual Activity  . Alcohol use: Yes    Alcohol/week: 1.0 standard drink    Types: 1 Glasses of wine per week    Comment: red wine once per month  . Drug use: No  . Sexual activity: Not Currently    Birth control/protection: Surgical  Other Topics Concern  . Not on file  Social History Narrative   Lives w/ husband   Caffeine use: 3 cups coffee/day   Social Determinants of Health   Financial Resource Strain: Not on file  Food Insecurity: Not on file  Transportation Needs: Not on file  Physical Activity: Not on file  Stress: Not on file  Social Connections: Not on file  Intimate Partner Violence: Not on file     Review of Systems: General: negative for chills, fever, night sweats or weight changes.  Cardiovascular: negative for chest pain, dyspnea on exertion, edema, orthopnea, palpitations, paroxysmal nocturnal dyspnea or shortness of breath Dermatological: negative for rash Respiratory: negative for cough or wheezing Urologic: negative for hematuria Abdominal: negative for nausea, vomiting, diarrhea, bright red blood per rectum, melena, or hematemesis Neurologic: negative for visual changes, syncope, or dizziness All other systems reviewed and are otherwise negative except as noted above.    Blood pressure (!) 146/70, pulse 91, weight 145 lb 6.4 oz (66 kg), SpO2 97 %.  General appearance: alert and no distress Neck: no adenopathy, no carotid bruit, no JVD, supple, symmetrical, trachea midline and thyroid not enlarged, symmetric, no  tenderness/mass/nodules Lungs: clear to auscultation bilaterally Heart: irregularly irregular rhythm Extremities: extremities normal, atraumatic, no cyanosis or edema Pulses: 2+ and symmetric Skin: Skin color, texture, turgor normal. No rashes or lesions Neurologic: Alert and oriented X 3, normal strength and tone. Normal symmetric reflexes. Normal coordination and gait  EKG atrial fibrillation with a ventricular sponsor of 91 and septal Q waves.  I personally reviewed this EKG.  ASSESSMENT AND PLAN:   Hx of CABG History of CAD status post coronary artery bypass grafting by Dr. Roxan Hockey March 2000 with a LIMA to the LAD, vein to marginal branch of the circumflex and distal right coronary artery.  Her last Myoview stress test performed 03/06/2017 was low risk with normal EF.  She was recently admitted to the hospital on 12/04/2020 with chest pain, A. fib with RVR.  Dr. Harl Bowie was consulted.  Her enzymes  were negative.  He felt that her chest pain was related to her elevated heart rate.  Medications were adjusted.  Dyslipidemia, goal LDL below 70 History of dyslipidemia on statin therapy with lipid profile performed 12/05/2020 revealing total cholesterol 129, LDL 49 and HDL of 73.  Essential hypertension History of essential hypertension blood pressure measured today 146/60.  She is on amlodipine, metoprolol.  Persistent atrial fibrillation History of persistent A. fib rate controlled on Eliquis oral anticoagulation.  I am going to modify her beta-blocker dosing from 25 mg p.o. 3 times daily to 50 mg p.o. twice daily.      [name] MD FACP,FACC,FAHA, [facility] 12/19/2020 9:57 AM

## 2020-12-19 NOTE — Telephone Encounter (Signed)
Pt is on her way to office for appointment with Dr Gwenlyn Found and brought application with her to be filled out .Adonis Housekeeper

## 2020-12-19 NOTE — Patient Instructions (Signed)
Medication Instructions:   -Change Metoprolol to 50mg  twice daily.  *If you need a refill on your cardiac medications before your next appointment, please call your pharmacy*   Follow-Up: At [facility], you and your health needs are our priority.  As part of our continuing mission to provide you with exceptional heart care, we have created designated Provider Care Teams.  These Care Teams include your primary Cardiologist (physician) and Advanced Practice Providers (APPs -  Physician Assistants and Nurse Practitioners) who all work together to provide you with the care you need, when you need it.  We recommend signing up for the patient portal called "MyChart".  Sign up information is provided on this After Visit Summary.  MyChart is used to connect with patients for Virtual Visits (Telemedicine).  Patients are able to view lab/test results, encounter notes, upcoming appointments, etc.  Non-urgent messages can be sent to your provider as well.   To learn more about what you can do with MyChart, go to [email or web address].    Your next appointment:   3 month(s)  The format for your next appointment:   In Person  Provider:   You will see one of the following Advanced Practice Providers on your designated Care Team:    Sande Rives, PA-C  Coletta Memos, FNP  Then, Quay Burow, MD will plan to see you again in 6 month(s).

## 2020-12-19 NOTE — Assessment & Plan Note (Signed)
History of dyslipidemia on statin therapy with lipid profile performed 12/05/2020 revealing total cholesterol 129, LDL 49 and HDL of 73.

## 2020-12-19 NOTE — Assessment & Plan Note (Signed)
History of persistent A. fib rate controlled on Eliquis oral anticoagulation.  I am going to modify her beta-blocker dosing from 25 mg p.o. 3 times daily to 50 mg p.o. twice daily.

## 2020-12-19 NOTE — Assessment & Plan Note (Signed)
History of CAD status post coronary artery bypass grafting by Dr. Roxan Hockey March 2000 with a LIMA to the LAD, vein to marginal branch of the circumflex and distal right coronary artery.  Her last Myoview stress test performed 03/06/2017 was low risk with normal EF.  She was recently admitted to the hospital on 12/04/2020 with chest pain, A. fib with RVR.  Dr. Harl Bowie was consulted.  Her enzymes were negative.  He felt that her chest pain was related to her elevated heart rate.  Medications were adjusted.

## 2020-12-19 NOTE — Assessment & Plan Note (Signed)
History of essential hypertension blood pressure measured today 146/60.  She is on amlodipine, metoprolol.

## 2020-12-21 ENCOUNTER — Other Ambulatory Visit: Payer: Self-pay | Admitting: Family Medicine

## 2020-12-25 ENCOUNTER — Other Ambulatory Visit: Payer: Self-pay | Admitting: Family Medicine

## 2020-12-25 DIAGNOSIS — Z1231 Encounter for screening mammogram for malignant neoplasm of breast: Secondary | ICD-10-CM

## 2020-12-29 ENCOUNTER — Other Ambulatory Visit: Payer: Self-pay | Admitting: Family Medicine

## 2020-12-31 ENCOUNTER — Other Ambulatory Visit: Payer: Self-pay

## 2020-12-31 ENCOUNTER — Encounter: Payer: Self-pay | Admitting: Nurse Practitioner

## 2020-12-31 ENCOUNTER — Ambulatory Visit (INDEPENDENT_AMBULATORY_CARE_PROVIDER_SITE_OTHER): Payer: Medicare HMO | Admitting: Nurse Practitioner

## 2020-12-31 VITALS — BP 138/74 | HR 85 | Temp 97.6°F | Ht 62.0 in | Wt 147.0 lb

## 2020-12-31 DIAGNOSIS — E785 Hyperlipidemia, unspecified: Secondary | ICD-10-CM | POA: Diagnosis not present

## 2020-12-31 DIAGNOSIS — K219 Gastro-esophageal reflux disease without esophagitis: Secondary | ICD-10-CM | POA: Diagnosis not present

## 2020-12-31 DIAGNOSIS — I10 Essential (primary) hypertension: Secondary | ICD-10-CM

## 2020-12-31 NOTE — Progress Notes (Signed)
Established Patient Office Visit  Subjective:  Patient ID: Kristina Gibbs, female    DOB: 02-03-1946  Age: 75 y.o. MRN: [id number]  CC:  Chief Complaint  Patient presents with  . Medical Management of Chronic Issues    HPI Kristina Gibbs presents for Pt presents for follow up of hypertension. Patient was diagnosed in 02/22/2014.. The patient is tolerating the medication well without side effects. Compliance with treatment has been good; including taking medication as directed , maintains a healthy diet and regular exercise regimen , and following up as directed.  Mixed hyperlipidemia  Pt presents with hyperlipidemia. Patient was diagnosed in 06/05/2012. Compliance with treatment has been good; The patient is compliant with medications, maintains a low cholesterol diet , follows up as directed , and maintains an exercise regimen . The patient denies experiencing any hypercholesterolemia related symptoms.    GERD, Follow up:  The patient was last seen for GERD 2 years ago. Changes made since that visit include started Protonix 40 mg tablet daily..  She reports good compliance with treatment. She is not having side effects. .  She IS experiencing  She is NOT experiencing bilious reflux, chest pain, choking on food or cough  -----------------------------------------------------------------------------------------   Past Medical History:  Diagnosis Date  . Allergy   . Arthritis   . Cancer ([place])    ovarian  . Coronary artery disease   . Heart murmur   . Hypercholesteremia   . Hypertension   . 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 Osteopenia   . Stomach problems   . 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 Superficial basal cell carcinoma (BCC) 03/30/1999   Left Thigh (treatment)  . Unstable angina ([place]) 05/27/2014    Past Surgical History:  Procedure Laterality Date  . ABDOMINAL HYSTERECTOMY    . CARDIOVERSION N/A 01/25/2018    Procedure: CARDIOVERSION;  Surgeon: Acie Fredrickson Wonda Cheng, MD;  Location: [facility];  Service: Cardiovascular;  Laterality: N/A;  . CHOLECYSTECTOMY    . CORONARY ARTERY BYPASS GRAFT    . EYE SURGERY     bilateral cataract extraction  . HAND SURGERY Left    injurred hand at work- Programmer, applications  . hysterectomy    . LEFT HEART CATHETERIZATION WITH CORONARY ANGIOGRAM N/A 05/27/2014   Procedure: LEFT HEART CATHETERIZATION WITH CORONARY ANGIOGRAM;  Surgeon: [name], MD;  Location: [facility] CATH LAB;  Service: Cardiovascular;  Laterality: N/A;  . PCTA     x7     Family History  Problem Relation Age of Onset  . Stroke Mother   . Diabetes Mother   . Kidney disease Mother   . Hypertension Mother   . Hyperlipidemia Mother   . Dementia Mother   . Heart disease Daughter        heart attack x2  . Heart disease Brother        2 heart attacks  . Breast cancer Maternal Aunt   . Colon cancer Neg Hx   . Stomach cancer Neg Hx   . Esophageal cancer Neg Hx   . Rectal cancer Neg Hx   . Liver cancer Neg Hx     Social History   Socioeconomic History  . Marital status: Married    Spouse name: [name]   . Number of children: 3  . Years of education: 43  . Highest education level: High school graduate  Occupational History  . Occupation: Retired  Tobacco Use  .  Smoking status: Former Smoker    Packs/day: 0.50    Years: 49.00    Pack years: 24.50    Types: Cigarettes    Quit date: 10/04/2008    Years since quitting: 12.2  . Smokeless tobacco: Never Used  Vaping Use  . Vaping Use: Never used  Substance and Sexual Activity  . Alcohol use: Yes    Alcohol/week: 1.0 standard drink    Types: 1 Glasses of wine per week    Comment: red wine once per month  . Drug use: No  . Sexual activity: Not Currently    Birth control/protection: Surgical  Other Topics Concern  . Not on file  Social History Narrative   Lives w/ husband   Caffeine use: 3 cups coffee/day   Social Determinants of Health    Financial Resource Strain: Not on file  Food Insecurity: Not on file  Transportation Needs: Not on file  Physical Activity: Not on file  Stress: Not on file  Social Connections: Not on file  Intimate Partner Violence: Not on file    Outpatient Medications Prior to Visit  Medication Sig Dispense Refill  . alendronate (FOSAMAX) 70 MG tablet TAKE 1 TABLET (70 MG TOTAL) BY MOUTH EVERY 7 (SEVEN) DAYS. TAKE WITH A FULL GLASS OF WATER ON AN EMPTY STOMACH. DO NOT LIE DOWN FOR 1 HOUR AFTER TAKING. 12 tablet 0  . amLODipine (NORVASC) 2.5 MG tablet Take 1 tablet (2.5 mg total) by mouth daily. TAKE 1 TABLET BY MOUTH EVERY DAY 30 tablet 3  . apixaban (ELIQUIS) 5 MG TABS tablet Take 1 tablet (5 mg total) by mouth 2 (two) times daily. 180 tablet 2  . aspirin EC 81 MG tablet Take 81 mg by mouth at bedtime.     Marland Kitchen b complex vitamins tablet Take 1 tablet by mouth daily.     . calcium carbonate (OS-CAL) 600 MG TABS tablet Take 1 tablet (600 mg total) by mouth daily. 30 tablet 3  . cholecalciferol (VITAMIN D) 1000 UNITS tablet Take 1,000 Units by mouth daily.    . CO ENZYME Q-10 PO Take 1 capsule by mouth daily.     . memantine (NAMENDA) 10 MG tablet TAKE 1 TABLET BY MOUTH TWICE A DAY 180 tablet 0  . metoprolol succinate (TOPROL-XL) 50 MG 24 hr tablet Take 1 tablet (50 mg total) by mouth in the morning and at bedtime. 180 tablet 3  . nitroGLYCERIN (NITROLINGUAL) 0.4 MG/SPRAY spray PLACE 1 SPRAY UNDER THE TONGUE EVERY 5 (FIVE) MINUTES AS NEEDED FOR CHEST PAIN. 12 g 1  . omega-3 acid ethyl esters (LOVAZA) 1 g capsule TAKE 1 CAPSULE (1 G TOTAL) BY MOUTH 4 (FOUR) TIMES DAILY. 360 capsule 2  . pantoprazole (PROTONIX) 40 MG tablet Take 1 tablet (40 mg total) by mouth 2 (two) times daily. 60 tablet 3  . PATADAY 0.2 % SOLN Place 1 drop into both eyes 2 (two) times daily.  6  . RANEXA 500 MG 12 hr tablet TAKE 1 TABLET BY MOUTH TWICE A DAY (Patient taking differently: Take 500 mg by mouth 2 (two) times daily.) 180  tablet 2  . rosuvastatin (CRESTOR) 20 MG tablet Take 1 tablet (20 mg total) by mouth daily. 90 tablet 3  . vitamin C (ASCORBIC ACID) 500 MG tablet Take 1,000 mg by mouth daily.      No facility-administered medications prior to visit.    Allergies  Allergen Reactions  . Codeine Nausea And Vomiting  . Hydrocortisone Itching  .  Lactose Intolerance (Gi) Nausea And Vomiting and Nausea Only    ROS Review of Systems  Constitutional: Negative.   HENT: Negative.   Respiratory: Negative.   Gastrointestinal: Negative.   Genitourinary: Negative.   Musculoskeletal: Negative.   Skin: Negative.   All other systems reviewed and are negative.     Objective:    Physical Exam Vitals reviewed.  Constitutional:      General: She is awake.     Appearance: Normal appearance. She is well-groomed.     Interventions: Face mask in place.  HENT:     Head: Normocephalic.     Nose: Nose normal.  Eyes:     Conjunctiva/sclera: Conjunctivae normal.  Cardiovascular:     Rate and Rhythm: Normal rate and regular rhythm.     Pulses: Normal pulses.     Heart sounds: Normal heart sounds.  Pulmonary:     Effort: Pulmonary effort is normal.     Breath sounds: Normal breath sounds.  Abdominal:     General: Bowel sounds are normal.  Musculoskeletal:        General: Normal range of motion.  Skin:    General: Skin is warm.  Neurological:     Mental Status: She is oriented to person, place, and time.  Psychiatric:        Behavior: Behavior is cooperative.     BP 138/74   Pulse 85   Temp 97.6 F (36.4 C) (Temporal)   Ht 5\' 2"  (1.575 m)   Wt 147 lb (66.7 kg)   SpO2 99%   BMI 26.89 kg/m  Wt Readings from Last 3 Encounters:  12/31/20 147 lb (66.7 kg)  12/19/20 145 lb 6.4 oz (66 kg)  12/16/20 145 lb (65.8 kg)     Health Maintenance Due  Topic Date Due  . COVID-19 Vaccine (3 - Moderna risk 4-dose series) 04/01/2020    There are no preventive care reminders to display for this  patient.  Lab Results  Component Value Date   TSH 2.750 05/02/2020   Lab Results  Component Value Date   WBC 4.7 12/04/2020   HGB 11.3 (L) 12/04/2020   HCT 34.8 (L) 12/04/2020   MCV 87.2 12/04/2020   PLT 240 12/04/2020   Lab Results  Component Value Date   NA 137 12/16/2020   K 4.6 12/16/2020   CO2 23 12/16/2020   GLUCOSE 102 (H) 12/16/2020   BUN 15 12/16/2020   CREATININE 1.06 (H) 12/16/2020   BILITOT 0.9 12/04/2020   ALKPHOS 52 12/04/2020   AST 23 12/04/2020   ALT 26 12/04/2020   PROT 6.6 12/04/2020   ALBUMIN 3.4 (L) 12/04/2020   CALCIUM 8.8 12/16/2020   ANIONGAP 8 12/04/2020   Lab Results  Component Value Date   CHOL 129 12/05/2020   Lab Results  Component Value Date   HDL 73 12/05/2020   Lab Results  Component Value Date   LDLCALC 49 12/05/2020   Lab Results  Component Value Date   TRIG 36 12/05/2020   Lab Results  Component Value Date   CHOLHDL 1.8 12/05/2020   Lab Results  Component Value Date   HGBA1C 5.5 05/26/2014      Assessment & Plan:   Problem List Items Addressed This Visit      Cardiovascular and Mediastinum   Essential hypertension - Primary    Patient is well controlled on current medication amlodipine 2.5 mg tablet by mouth daily, metoprolol 50 mg tablet by mouth twice daily.  Digestive   GERD    Symptoms well controlled on Protonix 40 mg tablet by mouth twice daily.        Other   Dyslipidemia, goal LDL below 70    Symptoms well controlled on current regimen.  No signs and symptoms of hyperlipidemia.          No orders of the defined types were placed in this encounter.   Follow-up: Return in about 3 months (around 04/02/2021).    Ivy Lynn, NP

## 2020-12-31 NOTE — Assessment & Plan Note (Signed)
Patient is well controlled on current medication amlodipine 2.5 mg tablet by mouth daily, metoprolol 50 mg tablet by mouth twice daily.

## 2020-12-31 NOTE — Assessment & Plan Note (Signed)
Symptoms well controlled on Protonix 40 mg tablet by mouth twice daily.

## 2020-12-31 NOTE — Patient Instructions (Signed)
Gastroesophageal Reflux Disease, Adult  Gastroesophageal reflux (GER) happens when acid from the stomach flows up into the tube that connects the mouth and the stomach (esophagus). Normally, food travels down the esophagus and stays in the stomach to be digested. With GER, food and stomach acid sometimes move back up into the esophagus. You may have a disease called gastroesophageal reflux disease (GERD) if the reflux:  Happens often.  Causes frequent or very bad symptoms.  Causes problems such as damage to the esophagus. When this happens, the esophagus becomes sore and swollen. Over time, GERD can make small holes (ulcers) in the lining of the esophagus. What are the causes? This condition is caused by a problem with the muscle between the esophagus and the stomach. When this muscle is weak or not normal, it does not close properly to keep food and acid from coming back up from the stomach. The muscle can be weak because of:  Tobacco use.  Pregnancy.  Having a certain type of hernia (hiatal hernia).  Alcohol use.  Certain foods and drinks, such as coffee, chocolate, onions, and peppermint. What increases the risk?  Being overweight.  Having a disease that affects your connective tissue.  Taking NSAIDs, such a ibuprofen. What are the signs or symptoms?  Heartburn.  Difficult or painful swallowing.  The feeling of having a lump in the throat.  A bitter taste in the mouth.  Bad breath.  Having a lot of saliva.  Having an upset or bloated stomach.  Burping.  Chest pain. Different conditions can cause chest pain. Make sure you see your doctor if you have chest pain.  Shortness of breath or wheezing.  A long-term cough or a cough at night.  Wearing away of the surface of teeth (tooth enamel).  Weight loss. How is this treated?  Making changes to your diet.  Taking medicine.  Having surgery. Treatment will depend on how bad your symptoms are. Follow these  instructions at home: Eating and drinking  Follow a diet as told by your doctor. You may need to avoid foods and drinks such as: ? Coffee and tea, with or without caffeine. ? Drinks that contain alcohol. ? Energy drinks and sports drinks. ? Bubbly (carbonated) drinks or sodas. ? Chocolate and cocoa. ? Peppermint and mint flavorings. ? Garlic and onions. ? Horseradish. ? Spicy and acidic foods. These include peppers, chili powder, curry powder, vinegar, hot sauces, and BBQ sauce. ? Citrus fruit juices and citrus fruits, such as oranges, lemons, and limes. ? Tomato-based foods. These include red sauce, chili, salsa, and pizza with red sauce. ? Fried and fatty foods. These include donuts, french fries, potato chips, and high-fat dressings. ? High-fat meats. These include hot dogs, rib eye steak, sausage, ham, and bacon. ? High-fat dairy items, such as whole milk, butter, and cream cheese.  Eat small meals often. Avoid eating large meals.  Avoid drinking large amounts of liquid with your meals.  Avoid eating meals during the 2-3 hours before bedtime.  Avoid lying down right after you eat.  Do not exercise right after you eat.   Lifestyle  Do not smoke or use any products that contain nicotine or tobacco. If you need help quitting, ask your doctor.  Try to lower your stress. If you need help doing this, ask your doctor.  If you are overweight, lose an amount of weight that is healthy for you. Ask your doctor about a safe weight loss goal.   General instructions  Pay attention to any changes in your symptoms.  Take over-the-counter and prescription medicines only as told by your doctor.  Do not take aspirin, ibuprofen, or other NSAIDs unless your doctor says it is okay.  Wear loose clothes. Do not wear anything tight around your waist.  Raise (elevate) the head of your bed about 6 inches (15 cm). You may need to use a wedge to do this.  Avoid bending over if this makes your  symptoms worse.  Keep all follow-up visits. Contact a doctor if:  You have new symptoms.  You lose weight and you do not know why.  You have trouble swallowing or it hurts to swallow.  You have wheezing or a cough that keeps happening.  You have a hoarse voice.  Your symptoms do not get better with treatment. Get help right away if:  You have sudden pain in your arms, neck, jaw, teeth, or back.  You suddenly feel sweaty, dizzy, or light-headed.  You have chest pain or shortness of breath.  You vomit and the vomit is green, yellow, or black, or it looks like blood or coffee grounds.  You faint.  Your poop (stool) is red, bloody, or black.  You cannot swallow, drink, or eat. These symptoms may represent a serious problem that is an emergency. Do not wait to see if the symptoms will go away. Get medical help right away. Call your local emergency services (911 in the [place] Do not drive yourself to the hospital. Summary  If a person has gastroesophageal reflux disease (GERD), food and stomach acid move back up into the esophagus and cause symptoms or problems such as damage to the esophagus.  Treatment will depend on how bad your symptoms are.  Follow a diet as told by your doctor.  Take all medicines only as told by your doctor. This information is not intended to replace advice given to you by your health care provider. Make sure you discuss any questions you have with your health care provider. Document Revised: 03/31/2020 Document Reviewed: 03/31/2020 Elsevier Patient Education  2021 [place]. Hypertension, Adult Hypertension is another name for high blood pressure. High blood pressure forces your heart to work harder to pump blood. This can cause problems over time. There are two numbers in a blood pressure reading. There is a top number (systolic) over a bottom number (diastolic). It is best to have a blood pressure that is below 120/80. Healthy choices can help  lower your blood pressure, or you may need medicine to help lower it. What are the causes? The cause of this condition is not known. Some conditions may be related to high blood pressure. What increases the risk?  Smoking.  Having type 2 diabetes mellitus, high cholesterol, or both.  Not getting enough exercise or physical activity.  Being overweight.  Having too much fat, sugar, calories, or salt (sodium) in your diet.  Drinking too much alcohol.  Having long-term (chronic) kidney disease.  Having a family history of high blood pressure.  Age. Risk increases with age.  Race. You may be at higher risk if you are African American.  Gender. Men are at higher risk than women before age 74. After age 22, women are at higher risk than men.  Having obstructive sleep apnea.  Stress. What are the signs or symptoms?  High blood pressure may not cause symptoms. Very high blood pressure (hypertensive crisis) may cause: ? Headache. ? Feelings of worry or nervousness (anxiety). ? Shortness of  breath. ? Nosebleed. ? A feeling of being sick to your stomach (nausea). ? Throwing up (vomiting). ? Changes in how you see. ? Very bad chest pain. ? Seizures. How is this treated?  This condition is treated by making healthy lifestyle changes, such as: ? Eating healthy foods. ? Exercising more. ? Drinking less alcohol.  Your health care provider may prescribe medicine if lifestyle changes are not enough to get your blood pressure under control, and if: ? Your top number is above 130. ? Your bottom number is above 80.  Your personal target blood pressure may vary. Follow these instructions at home: Eating and drinking  If told, follow the DASH eating plan. To follow this plan: ? Fill one half of your plate at each meal with fruits and vegetables. ? Fill one fourth of your plate at each meal with whole grains. Whole grains include whole-wheat pasta, brown rice, and whole-grain  bread. ? Eat or drink low-fat dairy products, such as skim milk or low-fat yogurt. ? Fill one fourth of your plate at each meal with low-fat (lean) proteins. Low-fat proteins include fish, chicken without skin, eggs, beans, and tofu. ? Avoid fatty meat, cured and processed meat, or chicken with skin. ? Avoid pre-made or processed food.  Eat less than 1,500 mg of salt each day.  Do not drink alcohol if: ? Your doctor tells you not to drink. ? You are pregnant, may be pregnant, or are planning to become pregnant.  If you drink alcohol: ? Limit how much you use to:  0-1 drink a day for women.  0-2 drinks a day for men. ? Be aware of how much alcohol is in your drink. In the [place] one drink equals one 12 oz bottle of beer (355 mL), one 5 oz glass of wine (148 mL), or one 1 oz glass of hard liquor (44 mL).   Lifestyle  Work with your doctor to stay at a healthy weight or to lose weight. Ask your doctor what the best weight is for you.  Get at least 30 minutes of exercise most days of the week. This may include walking, swimming, or biking.  Get at least 30 minutes of exercise that strengthens your muscles (resistance exercise) at least 3 days a week. This may include lifting weights or doing Pilates.  Do not use any products that contain nicotine or tobacco, such as cigarettes, e-cigarettes, and chewing tobacco. If you need help quitting, ask your doctor.  Check your blood pressure at home as told by your doctor.  Keep all follow-up visits as told by your doctor. This is important.   Medicines  Take over-the-counter and prescription medicines only as told by your doctor. Follow directions carefully.  Do not skip doses of blood pressure medicine. The medicine does not work as well if you skip doses. Skipping doses also puts you at risk for problems.  Ask your doctor about side effects or reactions to medicines that you should watch for. Contact a doctor if you:  Think you are  having a reaction to the medicine you are taking.  Have headaches that keep coming back (recurring).  Feel dizzy.  Have swelling in your ankles.  Have trouble with your vision. Get help right away if you:  Get a very bad headache.  Start to feel mixed up (confused).  Feel weak or numb.  Feel faint.  Have very bad pain in your: ? Chest. ? Belly (abdomen).  Throw up more than once.  Have trouble breathing. Summary  Hypertension is another name for high blood pressure.  High blood pressure forces your heart to work harder to pump blood.  For most people, a normal blood pressure is less than 120/80.  Making healthy choices can help lower blood pressure. If your blood pressure does not get lower with healthy choices, you may need to take medicine. This information is not intended to replace advice given to you by your health care provider. Make sure you discuss any questions you have with your health care provider. Document Revised: 05/31/2018 Document Reviewed: 05/31/2018 Elsevier Patient Education  2021 [place]. Dyslipidemia Dyslipidemia is an imbalance of waxy, fat-like substances (lipids) in the blood. The body needs lipids in small amounts. Dyslipidemia often involves a high level of cholesterol or triglycerides, which are types of lipids. Common forms of dyslipidemia include:  High levels of LDL cholesterol. LDL is the type of cholesterol that causes fatty deposits (plaques) to build up in the blood vessels that carry blood away from your heart (arteries).  Low levels of HDL cholesterol. HDL cholesterol is the type of cholesterol that protects against heart disease. High levels of HDL remove the LDL buildup from arteries.  High levels of triglycerides. Triglycerides are a fatty substance in the blood that is linked to a buildup of plaques in the arteries. What are the causes? Primary dyslipidemia is caused by changes (mutations) in genes that are passed down  through families (inherited). These mutations cause several types of dyslipidemia. Secondary dyslipidemia is caused by lifestyle choices and diseases that lead to dyslipidemia, such as:  Eating a diet that is high in animal fat.  Not getting enough exercise.  Having diabetes, kidney disease, liver disease, or thyroid disease.  Drinking large amounts of alcohol.  Using certain medicines. What increases the risk? You are more likely to develop this condition if you are an older man or if you are a woman who has gone through menopause. Other risk factors include:  Having a family history of dyslipidemia.  Taking certain medicines, including birth control pills, steroids, some diuretics, and beta-blockers.  Smoking cigarettes.  Eating a high-fat diet.  Having certain medical conditions such as diabetes, polycystic ovary syndrome (PCOS), kidney disease, liver disease, or hypothyroidism.  Not exercising regularly.  Being overweight or obese with too much belly fat. What are the signs or symptoms? In most cases, dyslipidemia does not usually cause any symptoms. In severe cases, very high lipid levels can cause:  Fatty bumps under the skin (xanthomas).  White or gray ring around the black center (pupil) of the eye. Very high triglyceride levels can cause inflammation of the pancreas (pancreatitis). How is this diagnosed? Your health care provider may diagnose dyslipidemia based on a routine blood test (fasting blood test). Because most people do not have symptoms of the condition, this blood testing (lipid profile) is done on adults age 94 and older and is repeated every 5 years. This test checks:  Total cholesterol. This measures the total amount of cholesterol in your blood, including LDL cholesterol, HDL cholesterol, and triglycerides. A healthy number is below 200.  LDL cholesterol. The target number for LDL cholesterol is different for each person, depending on individual risk  factors. Ask your health care provider what your LDL cholesterol should be.  HDL cholesterol. An HDL level of 60 or higher is best because it helps to protect against heart disease. A number below 45 for men or below 74 for women increases the risk  for heart disease.  Triglycerides. A healthy triglyceride number is below 150. If your lipid profile is abnormal, your health care provider may do other blood tests.   How is this treated? Treatment depends on the type of dyslipidemia that you have and your other risk factors for heart disease and stroke. Your health care provider will have a target range for your lipid levels based on this information. For many people, this condition may be treated by lifestyle changes, such as diet and exercise. Your health care provider may recommend that you:  Get regular exercise.  Make changes to your diet.  Quit smoking if you smoke. If diet changes and exercise do not help you reach your goals, your health care provider may also prescribe medicine to lower lipids. The most commonly prescribed type of medicine lowers your LDL cholesterol (statin drug). If you have a high triglyceride level, your provider may prescribe another type of drug (fibrate) or an omega-3 fish oil supplement, or both. Follow these instructions at home: Eating and drinking  Follow instructions from your health care provider or dietitian about eating or drinking restrictions.  Eat a healthy diet as told by your health care provider. This can help you reach and maintain a healthy weight, lower your LDL cholesterol, and raise your HDL cholesterol. This may include: ? Limiting your calories, if you are overweight. ? Eating more fruits, vegetables, whole grains, fish, and lean meats. ? Limiting saturated fat, trans fat, and cholesterol.  If you drink alcohol: ? Limit how much you use. ? Be aware of how much alcohol is in your drink. In the [place] one drink equals one 12 oz bottle of beer  (355 mL), one 5 oz glass of wine (148 mL), or one 1 oz glass of hard liquor (44 mL).  Do not drink alcohol if: ? Your health care provider tells you not to drink. ? You are pregnant, may be pregnant, or are planning to become pregnant. Activity  Get regular exercise. Start an exercise and strength training program as told by your health care provider. Ask your health care provider what activities are safe for you. Your health care provider may recommend: ? 30 minutes of aerobic activity 4-6 days a week. Brisk walking is an example of aerobic activity. ? Strength training 2 days a week. General instructions  Do not use any products that contain nicotine or tobacco, such as cigarettes, e-cigarettes, and chewing tobacco. If you need help quitting, ask your health care provider.  Take over-the-counter and prescription medicines only as told by your health care provider. This includes supplements.  Keep all follow-up visits as told by your health care provider.   Contact a health care provider if:  You are: ? Having trouble sticking to your exercise or diet plan. ? Struggling to quit smoking or control your use of alcohol. Summary  Dyslipidemia often involves a high level of cholesterol or triglycerides, which are types of lipids.  Treatment depends on the type of dyslipidemia that you have and your other risk factors for heart disease and stroke.  For many people, treatment starts with lifestyle changes, such as diet and exercise.  Your health care provider may prescribe medicine to lower lipids. This information is not intended to replace advice given to you by your health care provider. Make sure you discuss any questions you have with your health care provider. Document Revised: 05/15/2018 Document Reviewed: 04/21/2018 Elsevier Patient Education  [place].

## 2020-12-31 NOTE — Assessment & Plan Note (Signed)
Symptoms well controlled on current regimen.  No signs and symptoms of hyperlipidemia.

## 2021-01-02 ENCOUNTER — Ambulatory Visit (INDEPENDENT_AMBULATORY_CARE_PROVIDER_SITE_OTHER): Payer: Medicare HMO | Admitting: Nurse Practitioner

## 2021-01-02 ENCOUNTER — Encounter: Payer: Self-pay | Admitting: Nurse Practitioner

## 2021-01-02 DIAGNOSIS — R059 Cough, unspecified: Secondary | ICD-10-CM

## 2021-01-02 DIAGNOSIS — J302 Other seasonal allergic rhinitis: Secondary | ICD-10-CM

## 2021-01-02 MED ORDER — CETIRIZINE HCL 10 MG PO TABS: 10.0000 mg | ORAL_TABLET | Freq: Every day | ORAL | 0 refills | Status: DC

## 2021-01-02 MED ORDER — SALINE SPRAY 0.65 % NA SOLN: 1.0000 | NASAL | 4 refills | Status: DC | PRN

## 2021-01-02 NOTE — Progress Notes (Signed)
   Virtual Visit  Note Due to COVID-19 pandemic this visit was conducted virtually. This visit type was conducted due to national recommendations for restrictions regarding the COVID-19 Pandemic (e.g. social distancing, sheltering in place) in an effort to limit this patient's exposure and mitigate transmission in our community. All issues noted in this document were discussed and addressed.  A physical exam was not performed with this format.  I connected with Kristina Gibbs on 01/02/21 at  8:42 AM by telephone and verified that I am speaking with the correct person using two identifiers. Kristina Gibbs is currently located at at home during visit. The provider, Ivy Lynn, NP is located in their office at time of visit.  I discussed the limitations, risks, security and privacy concerns of performing an evaluation and management service by phone and the availability of in person appointments. I also discussed with the patient that there may be a patient responsible charge related to this service. The patient expressed understanding and agreed to proceed.   History and Present Illness:  Cough The current episode started in the past 7 days. The problem has been unchanged. The problem occurs constantly. The cough is non-productive. Pertinent negatives include no chest pain, chills, ear congestion, ear pain, fever, headaches, nasal congestion, rash, sore throat or shortness of breath. The symptoms are aggravated by pollens. She has tried nothing for the symptoms. Her past medical history is significant for environmental allergies.      Review of Systems  Constitutional: Negative for chills and fever.  HENT: Negative for ear pain and sore throat.   Respiratory: Positive for cough. Negative for shortness of breath.   Cardiovascular: Negative for chest pain.  Skin: Negative for rash.  Neurological: Negative for headaches.  Endo/Heme/Allergies: Positive for environmental allergies.   All other systems reviewed and are negative.    Observations/Objective:  Televisit-patient not found to be in distress. Assessment and Plan: Patient is reporting ongoing cough symptoms in the last few days.  Patient reports history of seasonal allergies.  Denies pain, fever, chills and body ache.  Cough is dry and causes throat irritation but without  sore throat. Started patient on Ocean nasal spray, Zyrtec 10 mg tablet daily.  Increase hydration and avoid triggers.  Follow Up Instructions: Follow-up with worsening unresolved symptoms.    I discussed the assessment and treatment plan with the patient. The patient was provided an opportunity to ask questions and all were answered. The patient agreed with the plan and demonstrated an understanding of the instructions.   The patient was advised to call back or seek an in-person evaluation if the symptoms worsen or if the condition fails to improve as anticipated.  The above assessment and management plan was discussed with the patient. The patient verbalized understanding of and has agreed to the management plan. Patient is aware to call the clinic if symptoms persist or worsen. Patient is aware when to return to the clinic for a follow-up visit. Patient educated on when it is appropriate to go to the emergency department.   Time call ended: 8:49 AM  I provided 6 minutes of non-face-to-face time during this encounter.    Ivy Lynn, NP

## 2021-01-02 NOTE — Assessment & Plan Note (Signed)
Patient is reporting ongoing cough symptoms in the last few days.  Patient reports history of seasonal allergies.  Denies pain, fever, chills and body ache.  Cough is dry and causes throat irritation but without  sore throat. Started patient on Ocean nasal spray, Zyrtec 10 mg tablet daily.  Increase hydration and avoid triggers.

## 2021-01-05 ENCOUNTER — Other Ambulatory Visit: Payer: Self-pay | Admitting: Family Medicine

## 2021-01-05 DIAGNOSIS — M85851 Other specified disorders of bone density and structure, right thigh: Secondary | ICD-10-CM

## 2021-01-09 ENCOUNTER — Other Ambulatory Visit: Payer: Self-pay | Admitting: Cardiovascular Disease

## 2021-01-14 ENCOUNTER — Telehealth: Payer: Self-pay | Admitting: Cardiovascular Disease

## 2021-01-14 NOTE — Telephone Encounter (Signed)
Pt c/o medication issue:  1. Name of Medication: Ranolazine 2. How are you currently taking this medication (dosage and times per day)?   3. Are you having a reaction (difficulty breathing--STAT)?   4. What is your medication issue? PT has questions about this medication and scared to take it she wants to speak with the nurse.Pleae advise

## 2021-01-14 NOTE — Telephone Encounter (Signed)
Returned call to patient to discuss, she states she has questions about the medication "ranolazine".   Advised this is the generic name for Ranexa, which she has been on for years.    She was unaware these were the same medication.    No further questions at this time.  Advised to call back with any additional questions or concerns.

## 2021-01-15 ENCOUNTER — Telehealth: Payer: Self-pay | Admitting: Cardiovascular Disease

## 2021-01-15 NOTE — Telephone Encounter (Signed)
Follow Up:     Pt was returning  a call, but she did not know who called her today.

## 2021-01-15 NOTE — Telephone Encounter (Signed)
Spoke with pt on the phone regarding follow up. Pt spoke with triage nurse yesterday regarding renexa. Pt states that must be why she had a message on phone. Pt is thankful for the call and has no additional questions at this time.

## 2021-01-26 ENCOUNTER — Other Ambulatory Visit: Payer: Self-pay | Admitting: *Deleted

## 2021-01-26 ENCOUNTER — Ambulatory Visit (INDEPENDENT_AMBULATORY_CARE_PROVIDER_SITE_OTHER): Payer: Medicare HMO | Admitting: Family Medicine

## 2021-01-26 ENCOUNTER — Encounter: Payer: Self-pay | Admitting: Family Medicine

## 2021-01-26 DIAGNOSIS — J302 Other seasonal allergic rhinitis: Secondary | ICD-10-CM

## 2021-01-26 DIAGNOSIS — R059 Cough, unspecified: Secondary | ICD-10-CM

## 2021-01-26 MED ORDER — CETIRIZINE HCL 10 MG PO TABS: 10.0000 mg | ORAL_TABLET | Freq: Every day | ORAL | 3 refills | Status: DC

## 2021-01-26 NOTE — Progress Notes (Signed)
Attempted to call patient 3 times.  Had talked to her earlier but she says she could not talk about times and call back after 4, have called 3 times after 4 and she still did not answer Kristina Pina, MD [facility] 01/26/2021, 4:54 PM

## 2021-01-27 ENCOUNTER — Telehealth: Payer: Self-pay

## 2021-01-27 NOTE — Telephone Encounter (Signed)
Request sent to [facility] to start PA for rosuvastatin.

## 2021-01-28 ENCOUNTER — Telehealth: Payer: Self-pay | Admitting: Cardiovascular Disease

## 2021-01-28 NOTE — Telephone Encounter (Signed)
Kristina Gibbs, can you please address the Eliquis and cost him about this patient with either alternatives or preauthorization.

## 2021-01-28 NOTE — Telephone Encounter (Signed)
Spoke to patient she stated her insurance no longer covers Eliquis.Advised I will send message to [facility] for advice.

## 2021-01-28 NOTE — Telephone Encounter (Signed)
Patient's husband called said the insurance is not covering apixaban (ELIQUIS) 5 MG TABS tablet they want her to switch to different medication. Husband stats they can afford the medication on their own. Please advise

## 2021-01-28 NOTE — Telephone Encounter (Signed)
**Note De-Identified  Obfuscation** I attempted a Rosuvatatin PA through covermymeds using info from the pts [employer] Medicare card but received the following message:  No eligibility was found. Please reconfirm the member's health plan coverage before re-submitting.  I called [facility] (1st call ended while s/w pharmacist (?), 2nd attempt: on hold for 15 mins, 3rd attempt call ended after being on hold for more than 17 mins (?)). Per Google search for correct phone number, I called [phone] and the pharmacist answered quickly.  Per pharmacist the pt is requesting a refill to soon (last filled on 11/25/2020 for a 90 day supply) and a PA is not requited for her Rosuvastatin.

## 2021-02-17 ENCOUNTER — Ambulatory Visit: Payer: Medicare HMO

## 2021-02-20 ENCOUNTER — Telehealth: Payer: Self-pay | Admitting: Cardiovascular Disease

## 2021-02-20 ENCOUNTER — Other Ambulatory Visit: Payer: Self-pay | Admitting: Family Medicine

## 2021-02-20 NOTE — Telephone Encounter (Signed)
Returned the call to the patient's husband. He was calling for an update on the Eliquis. He stated that they were told that the assistance was not approved and would like to know where to go from here.

## 2021-02-20 NOTE — Telephone Encounter (Signed)
Pt c/o medication issue:  1. Name of Medication: apixaban (ELIQUIS) 5 MG TABS tablet   2. How are you currently taking this medication (dosage and times per day)? As directed  3. Are you having a reaction (difficulty breathing--STAT)? no  4. What is your medication issue? Patient is running low on medication and wants to know the status of this medication getting approved.  Pt c/o medication issue:  1. Name of Medication: Irbesartan  2. How are you currently taking this medication (dosage and times per day)? N/a  3. Are you having a reaction (difficulty breathing--STAT)? no  4. What is your medication issue? Patient's husband would like to know the status of this medication as well. He states that the pharmacy told him that Dr. Gwenlyn Found would not authorize this medication no more but no one has told them anything about stopping it. Please advise.

## 2021-02-23 ENCOUNTER — Other Ambulatory Visit: Payer: Self-pay

## 2021-02-23 ENCOUNTER — Encounter: Payer: Self-pay | Admitting: Nurse Practitioner

## 2021-02-23 ENCOUNTER — Ambulatory Visit (INDEPENDENT_AMBULATORY_CARE_PROVIDER_SITE_OTHER): Payer: Medicare HMO | Admitting: Nurse Practitioner

## 2021-02-23 VITALS — BP 138/70 | HR 77 | Temp 97.2°F | Ht 62.0 in | Wt 145.0 lb

## 2021-02-23 DIAGNOSIS — S40022A Contusion of left upper arm, initial encounter: Secondary | ICD-10-CM | POA: Diagnosis not present

## 2021-02-23 DIAGNOSIS — S40021A Contusion of right upper arm, initial encounter: Secondary | ICD-10-CM | POA: Diagnosis not present

## 2021-02-23 DIAGNOSIS — Z7901 Long term (current) use of anticoagulants: Secondary | ICD-10-CM | POA: Diagnosis not present

## 2021-02-23 DIAGNOSIS — T148XXA Other injury of unspecified body region, initial encounter: Secondary | ICD-10-CM

## 2021-02-23 DIAGNOSIS — S8012XA Contusion of left lower leg, initial encounter: Secondary | ICD-10-CM | POA: Diagnosis not present

## 2021-02-23 DIAGNOSIS — S31829A Unspecified open wound of left buttock, initial encounter: Secondary | ICD-10-CM | POA: Diagnosis not present

## 2021-02-23 DIAGNOSIS — S8011XA Contusion of right lower leg, initial encounter: Secondary | ICD-10-CM | POA: Diagnosis not present

## 2021-02-23 NOTE — Patient Instructions (Signed)
Blisters, Adult  A blister is a raised bubble of skin filled with liquid. Blisters often develop on skin that rubs or presses against another surface repeatedly (friction blister). Friction blisters can occur on any part of the body, but they usually form on the hands or the feet. Long-term pressure on that same area of skin can also cause the area of skin to become hardened. This hardened skin is called a callus. What are the causes? Besides friction, blisters can be caused by:  An injury, such as a burn.  An allergic reaction.  An infection.  Exposure to irritating chemicals. Friction blisters often occur in areas with a lot of heat and moisture. Friction blisters often result from:  Sports.  Repetitive activities.  Using tools and doing other activities without wearing gloves.  Shoes that are too tight or too loose. What are the signs or symptoms? A blister is often round and looks like a bump. It may hurt or feel itchy. Before a blister forms, your skin may:  Turn red.  Feel warm.  Itch.  Be painful to the touch. How is this diagnosed? A blister is diagnosed with a physical exam. How is this treated? Treatment usually involves protecting the area where the blister has formed until your skin has healed. Treatments may include:  Using a bandage (dressing) to cover your blister.  Putting extra padding around and over your blister so that the blister does not rub on anything.  Applying antibiotic ointment. Most blisters break open, dry up, and go away on their own within 1-2 weeks. Blisters that are very painful may be drained before they break open on their own. If your blister is large or painful, your health care provider can drain it. Follow these instructions at home: Medicines  Take or apply over-the-counter and prescription medicines only as told by your health care provider.  If you were prescribed an antibiotic medicine, take or apply it as told by your health  care provider. Do not stop using the antibiotic even if you start to feel better. Skin care  Do not pop your blister. This can cause infection.  Keep your blister clean and dry. This helps to prevent infection.  Before you swim or use a hot tub, cover your blister with a waterproof dressing.  Protect the area where your blister has formed as told by your health care provider.  Follow instructions from your health care provider about how to take care of your blister. Make sure you: ? Wash your hands with soap and water for at least 20 seconds before and after you change your dressing. If soap and water are not available, use hand sanitizer. ? Change your dressing as told by your health care provider. Infection signs Check your blister every day for signs of infection. Check for:  More redness, swelling, or pain.  More fluid or blood.  Warmth.  Pus or a bad smell. General instructions  If you have a blister on a foot or toe, wear different shoes until your blister heals.  Avoid the activity that caused the blister until your blister heals.  Keep all follow-up visits as told by your health care provider. This is important. How is this prevented? Taking these steps can help to prevent blisters that are caused by friction. Make sure you:  Wear comfortable shoes that fit well.  Always wear socks with shoes.  Wear extra socks or use tape, dressings, or pads over blister-prone areas as needed. You may also   apply petroleum jelly under dressings in blister-prone areas.  Wear protective gear, such as gloves, when taking part in sports or activities that can cause blisters.  Wear loose-fitting, moisture-wicking clothes when taking part in sports or activities.  Use powders as needed to keep your feet dry. Contact a health care provider if:  You have more redness, swelling, or pain around your blister.  You have more fluid or blood coming from your blister.  Your blister feels  warm to the touch.  You have pus or a bad smell coming from your blister.  You have a fever or chills.  Your blister gets better and then it gets worse. Summary  A blister is a raised bubble of skin filled with liquid. Blisters often develop on skin that rubs or presses against another surface repeatedly (friction blister).  Most blisters break open, dry up, and go away on their own within 1-2 weeks.  Keep your blister clean and dry. This helps to prevent infection.  Take steps to help prevent blisters that are caused by friction.  Contact a health care provider if you have signs of infection. This information is not intended to replace advice given to you by your health care provider. Make sure you discuss any questions you have with your health care provider. Document Revised: 08/09/2019 Document Reviewed: 08/09/2019 Elsevier Patient Education  2021 Elsevier Inc.  

## 2021-02-23 NOTE — Progress Notes (Signed)
Acute Office Visit  Subjective:    Patient ID: Kristina Gibbs, female    DOB: September 03, 1946, 75 y.o.   MRN: [id number]  Chief Complaint  Patient presents with  . Blister    HPI Patient is in today for blister left upper buttock area.  Blister is open slightly red around the edges.  Patient is reporting no pain .  Patient is also reporting bilateral lower extremity skin bruises worsening previous.  Patient reports skin skin, apply Neosporin and covering with Band-Aid.  No fever, nausea, or signs or symptoms of infection.  Past Medical History:  Diagnosis Date  . Allergy   . Arthritis   . Cancer ([place])    ovarian  . Coronary artery disease   . Heart murmur   . Hypercholesteremia   . Hypertension   . 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 Osteopenia   . Stomach problems   . 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 Superficial basal cell carcinoma (BCC) 03/30/1999   Left Thigh (treatment)  . Unstable angina ([place]) 05/27/2014    Past Surgical History:  Procedure Laterality Date  . ABDOMINAL HYSTERECTOMY    . CARDIOVERSION N/A 01/25/2018   Procedure: CARDIOVERSION;  Surgeon: Acie Fredrickson Wonda Cheng, MD;  Location: [facility];  Service: Cardiovascular;  Laterality: N/A;  . CHOLECYSTECTOMY    . CORONARY ARTERY BYPASS GRAFT    . EYE SURGERY     bilateral cataract extraction  . HAND SURGERY Left    injurred hand at work- Programmer, applications  . hysterectomy    . LEFT HEART CATHETERIZATION WITH CORONARY ANGIOGRAM N/A 05/27/2014   Procedure: LEFT HEART CATHETERIZATION WITH CORONARY ANGIOGRAM;  Surgeon: [name], MD;  Location[facility];  Service: Cardiovascular;  Laterality: N/A;  . PCTA     x7     Family History  Problem Relation Age of Onset  . Stroke Mother   . Diabetes Mother   . Kidney disease Mother   . Hypertension Mother   . Hyperlipidemia Mother   . Dementia Mother   . Heart disease Daughter        heart attack x2  . Heart disease  Brother        2 heart attacks  . Breast cancer Maternal Aunt   . Colon cancer Neg Hx   . Stomach cancer Neg Hx   . Esophageal cancer Neg Hx   . Rectal cancer Neg Hx   . Liver cancer Neg Hx     Social History   Socioeconomic History  . Marital status: Married    Spouse name: [name]   . Number of children: 3  . Years of education: 69  . Highest education level: High school graduate  Occupational History  . Occupation: Retired  Tobacco Use  . Smoking status: Former Smoker    Packs/day: 0.50    Years: 49.00    Pack years: 24.50    Types: Cigarettes    Quit date: 10/04/2008    Years since quitting: 12.3  . Smokeless tobacco: Never Used  Vaping Use  . Vaping Use: Never used  Substance and Sexual Activity  . Alcohol use: Yes    Alcohol/week: 1.0 standard drink    Types: 1 Glasses of wine per week    Comment: red wine once per month  . Drug use: No  . Sexual activity: Not Currently    Birth control/protection: Surgical  Other Topics Concern  .  Not on file  Social History Narrative   Lives w/ husband   Caffeine use: 3 cups coffee/day   Social Determinants of Health   Financial Resource Strain: Not on file  Food Insecurity: Not on file  Transportation Needs: Not on file  Physical Activity: Not on file  Stress: Not on file  Social Connections: Not on file  Intimate Partner Violence: Not on file    Outpatient Medications Prior to Visit  Medication Sig Dispense Refill  . alendronate (FOSAMAX) 70 MG tablet TAKE 1 TABLET (70 MG TOTAL) BY MOUTH EVERY 7 (SEVEN) DAYS. TAKE WITH A FULL GLASS OF WATER ON AN EMPTY STOMACH. DO NOT LIE DOWN FOR 1 HOUR AFTER TAKING. 12 tablet 1  . amLODipine (NORVASC) 2.5 MG tablet Take 1 tablet (2.5 mg total) by mouth daily. TAKE 1 TABLET BY MOUTH EVERY DAY 30 tablet 3  . apixaban (ELIQUIS) 5 MG TABS tablet Take 1 tablet (5 mg total) by mouth 2 (two) times daily. 180 tablet 2  . aspirin EC 81 MG tablet Take 81 mg by mouth at bedtime.     Marland Kitchen b  complex vitamins tablet Take 1 tablet by mouth daily.     . calcium carbonate (OS-CAL) 600 MG TABS tablet Take 1 tablet (600 mg total) by mouth daily. 30 tablet 3  . cetirizine (ZYRTEC ALLERGY) 10 MG tablet Take 1 tablet (10 mg total) by mouth daily. 90 tablet 3  . cholecalciferol (VITAMIN D) 1000 UNITS tablet Take 1,000 Units by mouth daily.    . CO ENZYME Q-10 PO Take 1 capsule by mouth daily.     . memantine (NAMENDA) 10 MG tablet TAKE 1 TABLET BY MOUTH TWICE A DAY 180 tablet 0  . metoprolol succinate (TOPROL-XL) 50 MG 24 hr tablet Take 1 tablet (50 mg total) by mouth in the morning and at bedtime. 180 tablet 3  . nitroGLYCERIN (NITROLINGUAL) 0.4 MG/SPRAY spray PLACE 1 SPRAY UNDER THE TONGUE EVERY 5 (FIVE) MINUTES AS NEEDED FOR CHEST PAIN. 12 g 1  . omega-3 acid ethyl esters (LOVAZA) 1 g capsule TAKE 1 CAPSULE (1 G TOTAL) BY MOUTH 4 (FOUR) TIMES DAILY. 360 capsule 2  . pantoprazole (PROTONIX) 40 MG tablet Take 1 tablet (40 mg total) by mouth 2 (two) times daily. 60 tablet 3  . PATADAY 0.2 % SOLN Place 1 drop into both eyes 2 (two) times daily.  6  . RANEXA 500 MG 12 hr tablet TAKE 1 TABLET BY MOUTH TWICE A DAY 180 tablet 2  . rosuvastatin (CRESTOR) 20 MG tablet Take 1 tablet (20 mg total) by mouth daily. 90 tablet 3  . sodium chloride (OCEAN) 0.65 % SOLN nasal spray Place 1 spray into both nostrils as needed for congestion. 60 mL 4  . vitamin C (ASCORBIC ACID) 500 MG tablet Take 1,000 mg by mouth daily.      No facility-administered medications prior to visit.    Allergies  Allergen Reactions  . Codeine Nausea And Vomiting  . Hydrocortisone Itching  . Lactose Intolerance (Gi) Nausea And Vomiting and Nausea Only    Review of Systems  Constitutional: Negative.   HENT: Negative.   Respiratory: Negative.   Cardiovascular: Positive for chest pain.  Gastrointestinal: Negative.   Genitourinary: Negative.   Skin: Positive for color change and rash.  All other systems reviewed and are  negative.      Objective:    Physical Exam Vitals reviewed.  Constitutional:      Appearance: Normal appearance.  HENT:     Head: Normocephalic.     Nose: Nose normal.  Eyes:     Conjunctiva/sclera: Conjunctivae normal.  Cardiovascular:     Rate and Rhythm: Normal rate and regular rhythm.     Pulses: Normal pulses.     Heart sounds: Normal heart sounds.  Pulmonary:     Effort: Pulmonary effort is normal.     Breath sounds: Normal breath sounds.  Abdominal:     General: Bowel sounds are normal.  Skin:    Findings: Bruising and rash present.  Neurological:     Mental Status: She is alert and oriented to person, place, and time.     BP 138/70   Pulse 77   Temp (!) 97.2 F (36.2 C) (Temporal)   Ht _0  (1.575 m)   Wt 145 lb (65.8 kg)   SpO2 96%   BMI 26.52 kg/m  Wt Readings from Last 3 Encounters:  02/23/21 145 lb (65.8 kg)  12/31/20 147 lb (66.7 kg)  12/19/20 145 lb 6.4 oz (66 kg)    Health Maintenance Due  Topic Date Due  . COVID-19 Vaccine (3 - Moderna risk 4-dose series) 04/01/2020    There are no preventive care reminders to display for this patient.   Lab Results  Component Value Date   TSH 2.750 05/02/2020   Lab Results  Component Value Date   WBC 4.7 12/04/2020   HGB 11.3 (L) 12/04/2020   HCT 34.8 (L) 12/04/2020   MCV 87.2 12/04/2020   PLT 240 12/04/2020   Lab Results  Component Value Date   NA 137 12/16/2020   K 4.6 12/16/2020   CO2 23 12/16/2020   GLUCOSE 102 (H) 12/16/2020   BUN 15 12/16/2020   CREATININE 1.06 (H) 12/16/2020   BILITOT 0.9 12/04/2020   ALKPHOS 52 12/04/2020   AST 23 12/04/2020   ALT 26 12/04/2020   PROT 6.6 12/04/2020   ALBUMIN 3.4 (L) 12/04/2020   CALCIUM 8.8 12/16/2020   ANIONGAP 8 12/04/2020   EGFR 54 (L) 12/16/2020   Lab Results  Component Value Date   CHOL 129 12/05/2020   Lab Results  Component Value Date   HDL 73 12/05/2020   Lab Results  Component Value Date   LDLCALC 49 12/05/2020   Lab  Results  Component Value Date   TRIG 36 12/05/2020   Lab Results  Component Value Date   CHOLHDL 1.8 12/05/2020   Lab Results  Component Value Date   HGBA1C 5.5 05/26/2014       Assessment & Plan:   Problem List Items Addressed This Visit      Other   Blister - Primary    Scattered bruising assessed on patient's bilateral lower extremity and bilateral upper extremities.  Provided extensive education to patient on the need to protect  arms and legs due to long-term use of Eliquis that may cause easy bruising and bleeding.  Patient verbalized understanding.  Cleaned patients blister on left upper buttock, applied Neosporin and covered with a Band-Aid.  Advised patient to follow-up with worsening unresolved symptoms.          No orders of the defined types were placed in this encounter.    Ivy Lynn, NP

## 2021-02-23 NOTE — Assessment & Plan Note (Signed)
Scattered bruising assessed on patient's bilateral lower extremity and bilateral upper extremities.  Provided extensive education to patient on the need to protect  arms and legs due to long-term use of Eliquis that may cause easy bruising and bleeding.  Patient verbalized understanding.  Cleaned patients blister on left upper buttock, applied Neosporin and covered with a Band-Aid.  Advised patient to follow-up with worsening unresolved symptoms.

## 2021-02-25 ENCOUNTER — Ambulatory Visit
Admission: RE | Admit: 2021-02-25 | Discharge: 2021-02-25 | Disposition: A | Discharge status: Home / Self Care | Payer: Medicare HMO | Source: Ambulatory Visit | Attending: Family Medicine | Admitting: Family Medicine

## 2021-02-25 ENCOUNTER — Ambulatory Visit: Payer: Medicare HMO

## 2021-02-25 ENCOUNTER — Other Ambulatory Visit: Payer: Self-pay

## 2021-02-25 DIAGNOSIS — Z1231 Encounter for screening mammogram for malignant neoplasm of breast: Secondary | ICD-10-CM | POA: Diagnosis not present

## 2021-02-26 NOTE — Telephone Encounter (Signed)
Spoke with pt's husband (ok per DPR) regarding eliquis and irbesartan. Per chart pt was taken off irbesartan when she was discharged from the hospital back in March. Since then pt has seen Dr. Gwenlyn Found in the office and medication was not restarted, will continue off this medication.  Pt's husband reports that they were able to get eliquis from their pharmacy for $45. Will continue with eliquis at this time. Advised husband to let us know if they run into any issues in the future. Husband verbalizes understanding.

## 2021-02-27 ENCOUNTER — Other Ambulatory Visit: Payer: Self-pay | Admitting: Cardiovascular Disease

## 2021-02-27 ENCOUNTER — Other Ambulatory Visit: Payer: Self-pay | Admitting: Family Medicine

## 2021-02-27 ENCOUNTER — Telehealth: Payer: Self-pay | Admitting: Cardiovascular Disease

## 2021-02-27 MED ORDER — AMLODIPINE BESYLATE 2.5 MG PO TABS: 2.5000 mg | ORAL_TABLET | Freq: Every day | ORAL | 3 refills | Status: DC

## 2021-02-27 NOTE — Telephone Encounter (Signed)
Pt's husband Mr. Prestia is returning call in regards to which medicine is supposed to replace Crestor 20mg . Please advise

## 2021-02-27 NOTE — Telephone Encounter (Signed)
This RN returned patient's call, patient reports she got a call for results, however this RN could not find a previous call from today for results. Additionally, this RN could not find any new results from this office. Pt was also asking about results for a breast screening scan she had done recently, this RN explained that the test was not ordered by our office and that she would receive the result from the ordering provider's office. Patient verbalized understanding. All questions/concerns addressed at this time.

## 2021-02-27 NOTE — Telephone Encounter (Signed)
Follow up:   Patient returning a call back for results

## 2021-02-27 NOTE — Addendum Note (Signed)
Addended by: Waylan Rocher on: 02/27/2021 01:28 PM   Modules accepted: Orders

## 2021-02-27 NOTE — Telephone Encounter (Signed)
Prescription refill request for Eliquis received. Indication:atrial fib Last office visit:3/22 berry Scr:1.0 Age: 75 Weight:65.8 kg  Prescription refilled

## 2021-03-05 ENCOUNTER — Telehealth: Payer: Self-pay | Admitting: Cardiovascular Disease

## 2021-03-05 ENCOUNTER — Other Ambulatory Visit: Payer: Self-pay

## 2021-03-05 ENCOUNTER — Ambulatory Visit (HOSPITAL_COMMUNITY)
Admission: RE | Admit: 2021-03-05 | Discharge: 2021-03-05 | Disposition: A | Discharge status: Home / Self Care | Payer: Medicare HMO | Source: Ambulatory Visit | Attending: Family | Admitting: Family

## 2021-03-05 ENCOUNTER — Encounter: Payer: Self-pay | Admitting: Family

## 2021-03-05 ENCOUNTER — Ambulatory Visit (INDEPENDENT_AMBULATORY_CARE_PROVIDER_SITE_OTHER): Payer: Medicare HMO | Admitting: Family

## 2021-03-05 VITALS — BP 128/62 | HR 73 | Temp 97.1°F | Ht 62.0 in | Wt 149.2 lb

## 2021-03-05 DIAGNOSIS — T148XXA Other injury of unspecified body region, initial encounter: Secondary | ICD-10-CM | POA: Diagnosis not present

## 2021-03-05 DIAGNOSIS — M79604 Pain in right leg: Secondary | ICD-10-CM

## 2021-03-05 DIAGNOSIS — M79661 Pain in right lower leg: Secondary | ICD-10-CM | POA: Diagnosis not present

## 2021-03-05 DIAGNOSIS — M7981 Nontraumatic hematoma of soft tissue: Secondary | ICD-10-CM | POA: Diagnosis not present

## 2021-03-05 DIAGNOSIS — M7989 Other specified soft tissue disorders: Secondary | ICD-10-CM | POA: Insufficient documentation

## 2021-03-05 NOTE — Patient Instructions (Signed)
Hematoma A hematoma is a collection of blood under the skin, in an organ, in a body space, in a joint space, or in other tissue. The blood can thicken (clot) to form a lump that you can see and feel. The lump is often firm and may become sore and tender. Most hematomas get better in a few days to weeks. However, some hematomas may be serious and require medical care. Hematomas can range from very small to very large. What are the causes? This condition is caused by:  A blunt or penetrating injury.  A leakage from a blood vessel under the skin.  Some medical procedures, including surgeries, such as oral surgery, face lifts, and surgeries on the joints.  Some medical conditions that cause bleeding or bruising. There may be multiple hematomas that appear in different areas of the body. What increases the risk? You are more likely to develop this condition if:  You are an older adult.  You use blood thinners. What are the signs or symptoms? Symptoms of this condition depend on where the hematoma is located.  Common symptoms of a hematoma that is under the skin include:  A firm lump on the body.  Pain and tenderness in the area.  Bruising. Blue, dark blue, purple-red, or yellowish skin (discoloration) may appear at the site of the hematoma if the hematoma is close to the surface of the skin. Common symptoms of a hematoma that is deep in the tissues or body spaces may be less obvious. They include:  A collection of blood in the stomach (intra-abdominal hematoma). This may cause pain in the abdomen, weakness, fainting, and shortness of breath.  A collection of blood in the head (intracranial hematoma). This may cause a headache or symptoms such as weakness, trouble speaking or understanding, or a change in consciousness.   How is this diagnosed? This condition is diagnosed based on:  Your medical history.  A physical exam.  Imaging tests, such as an ultrasound or CT scan. These may  be needed if your health care provider suspects a hematoma in deeper tissues or body spaces.  Blood tests. These may be needed if your health care provider believes that the hematoma is caused by a medical condition. How is this treated? Treatment for this condition depends on the cause, size, and location of the hematoma. Treatment may include:  Doing nothing. The majority of hematomas do not need treatment as many of them go away on their own over time.  Surgery or close monitoring. This may be needed for large hematomas or hematomas that affect vital organs.  Medicines. Medicines may be given if there is an underlying medical cause for the hematoma. Follow these instructions at home: Managing pain, stiffness, and swelling  If directed, put ice on the affected area. ? Put ice in a plastic bag. ? Place a towel between your skin and the bag. ? Leave the ice on for 20 minutes, 2-3 times a day for the first couple of days.  If directed, apply heat to the affected area after applying ice for a couple of days. Use the heat source that your health care provider recommends, such as a moist heat pack or a heating pad. ? Place a towel between your skin and the heat source. ? Leave the heat on for 20-30 minutes. ? Remove the heat if your skin turns bright red. This is especially important if you are unable to feel pain, heat, or cold. You may have a greater  risk of getting burned.  Raise (elevate) the affected area above the level of your heart while you are sitting or lying down.  If told, wrap the affected area with an elastic bandage. The bandage applies pressure (compression) to the area, which may help to reduce swelling and promote healing. Do not wrap the bandage too tightly around the affected area.  If your hematoma is on a leg or foot (lower extremity) and is painful, your health care provider may recommend crutches. Use them as told by your health care provider.   General  instructions  Take over-the-counter and prescription medicines only as told by your health care provider.  Keep all follow-up visits as told by your health care provider. This is important. Contact a health care provider if:  You have a fever.  The swelling or discoloration gets worse.  You develop more hematomas. Get help right away if:  Your pain is worse or your pain is not controlled with medicine.  Your skin over the hematoma breaks or starts bleeding.  Your hematoma is in your chest or abdomen and you have weakness, shortness of breath, or a change in consciousness.  You have a hematoma on your scalp that is caused by a fall or injury, and you also have: ? A headache that gets worse. ? Trouble speaking or understanding speech. ? Weakness. ? Change in alertness or consciousness. Summary  A hematoma is a collection of blood under the skin, in an organ, in a body space, in a joint space, or in other tissue.  This condition usually does not need treatment because many hematomas go away on their own over time.  Large hematomas, or those that may affect vital organs, may need surgical drainage or monitoring. If the hematoma is caused by a medical condition, medicines may be prescribed.  Get help right away if your hematoma breaks or starts to bleed, you have shortness of breath, or you have a headache or trouble speaking after a fall. This information is not intended to replace advice given to you by your health care provider. Make sure you discuss any questions you have with your health care provider. Document Revised: 02/14/2019 Document Reviewed: 02/23/2018 Elsevier Patient Education  2021 [employer].

## 2021-03-05 NOTE — Telephone Encounter (Signed)
*  STAT* If patient is at the pharmacy, call can be transferred to refill team.   1. Which medications need to be refilled? (please list name of each medication and dose if known) metoprolol succinate (TOPROL-XL) 50 MG 24 hr tablet  2. Which pharmacy/location (including street and city if local pharmacy) is medication to be sent to? [id number] - [place], [place] [address]  3. Do they need a 30 day or 90 day supply? 30 day supply

## 2021-03-05 NOTE — Progress Notes (Signed)
   Subjective:    Patient ID: Kristina Gibbs, female    DOB: Nov 02, 1945, 75 y.o.   MRN: [id number]   Chief Complaint  Patient presents with  . Cyst    BEHIND LEFT LEG BEEN THERE FOR 3 MTHS, SWELLING TENDER     HPI  Pt presents to the office today with bruising and hard area on right lower leg. Kristina Gibbs reports Kristina Gibbs noticed it 3 months ago, but has become larger.   States Kristina Gibbs has mild aching pain of 1 out 10 when touched. Denies any injury to the area.   Kristina Gibbs takes Eliquis 5 mg daily.   Review of Systems  Skin:       Right leg pain and swelling  All other systems reviewed and are negative.      Objective:   Physical Exam Vitals reviewed.  Constitutional:      General: Kristina Gibbs is not in acute distress.    Appearance: Kristina Gibbs is well-developed.  HENT:     Head: Normocephalic and atraumatic.  Eyes:     Pupils: Pupils are equal, round, and reactive to light.  Neck:     Thyroid: No thyromegaly.  Cardiovascular:     Rate and Rhythm: Normal rate and regular rhythm.     Heart sounds: Normal heart sounds. No murmur heard.   Pulmonary:     Effort: Pulmonary effort is normal. No respiratory distress.     Breath sounds: Normal breath sounds. No wheezing.  Abdominal:     General: Bowel sounds are normal. There is no distension.     Palpations: Abdomen is soft.     Tenderness: There is no abdominal tenderness.  Musculoskeletal:        General: Swelling and tenderness present.     Cervical back: Normal range of motion and neck supple.       Legs:     Comments: ecchymosis present, tenderness, hard nodule under skin approx 4 cm   Skin:    General: Skin is warm and dry.  Neurological:     Mental Status: Kristina Gibbs is alert and oriented to person, place, and time.     Cranial Nerves: No cranial nerve deficit.     Deep Tendon Reflexes: Reflexes are normal and symmetric.  Psychiatric:        Behavior: Behavior normal.        Thought Content: Thought content normal.        Judgment: Judgment  normal.      BP 128/62   Pulse 73   Temp (!) 97.1 F (36.2 C) (Temporal)   Ht 5\' 2"  (1.575 m)   Wt 149 lb 3.2 oz (67.7 kg)   BMI 27.29 kg/m       Assessment & Plan:  Kristina Gibbs comes in today with chief complaint of Cyst (BEHIND LEFT LEG BEEN THERE FOR 3 MTHS, SWELLING TENDER )   Diagnosis and orders addressed:  1. Hematoma - US Venous Img Lower Unilateral Right; Future  2. Leg swelling - US Venous Img Lower Unilateral Right; Future  3. Pain of right lower extremity - US Venous Img Lower Unilateral Right; Future  Given area has been there 3+ months, will order [place]. Pt is on Eliqus and I believe this is a hematoma, but will rule out. Area has become larger and more painful.   Kristina Dun, FNP

## 2021-03-06 NOTE — Telephone Encounter (Signed)
Patient should still have refills.  Looks like when it was refilled 12/19/2020 we sent in 180 tablets with 3 refills.    metoprolol succinate (TOPROL-XL) 50 MG 24 hr tablet 180 tablet 3 12/19/2020    Sig - Route: Take 1 tablet (50 mg total) by mouth in the morning and at bedtime. - Oral   Sent to pharmacy as: metoprolol succinate (TOPROL-XL) 50 MG 24 hr tablet   E-Prescribing Status: Receipt confirmed by pharmacy (12/19/2020 10:02 AM EDT)     Pharmacy  [id number] - [place], [place]

## 2021-03-11 ENCOUNTER — Ambulatory Visit (INDEPENDENT_AMBULATORY_CARE_PROVIDER_SITE_OTHER): Payer: Medicare HMO

## 2021-03-11 VITALS — Ht 62.0 in | Wt 149.0 lb

## 2021-03-11 DIAGNOSIS — Z0000 Encounter for general adult medical examination without abnormal findings: Secondary | ICD-10-CM

## 2021-03-11 NOTE — Patient Instructions (Signed)
Kristina Gibbs , Thank you for taking time to come for your Medicare Wellness Visit. I appreciate your ongoing commitment to your health goals. Please review the following plan we discussed and let me know if I can assist you in the future.   Screening recommendations/referrals: Colonoscopy: No longer required Mammogram: Done 02/25/2021 - Repeat annually Bone Density: Done 12/16/2020 - Repeat every 2 years Recommended yearly ophthalmology/optometry visit for glaucoma screening and checkup Recommended yearly dental visit for hygiene and checkup  Vaccinations: Influenza vaccine: Done 07/11/2020 - Repeat annually  Pneumococcal vaccine: Done 03/23/2011 & 02/24/2015 Tdap vaccine: Done 06/23/2010 - Past due for repeat (Every 10 years) Shingles vaccine: Due Shingrix discussed. Please contact your pharmacy for coverage information.  Covid-19: Done 03/04/2020  & 02/04/2020  Advanced directives: Please bring a copy of your health care power of attorney and living will to the office to be added to your chart at your convenience.  Conditions/risks identified: Aim for 30 minutes of exercise or brisk walking each day, drink 6-8 glasses of water and eat lots of fruits and vegetables.  Next appointment: Follow up in one year for your annual wellness visit    Preventive Care 65 Years and Older, Female Preventive care refers to lifestyle choices and visits with your health care provider that can promote health and wellness. What does preventive care include?  A yearly physical exam. This is also called an annual well check.  Dental exams once or twice a year.  Routine eye exams. Ask your health care provider how often you should have your eyes checked.  Personal lifestyle choices, including:  Daily care of your teeth and gums.  Regular physical activity.  Eating a healthy diet.  Avoiding tobacco and drug use.  Limiting alcohol use.  Practicing safe sex.  Taking low-dose aspirin every day.  Taking  vitamin and mineral supplements as recommended by your health care provider. What happens during an annual well check? The services and screenings done by your health care provider during your annual well check will depend on your age, overall health, lifestyle risk factors, and family history of disease. Counseling  Your health care provider may ask you questions about your:  Alcohol use.  Tobacco use.  Drug use.  Emotional well-being.  Home and relationship well-being.  Sexual activity.  Eating habits.  History of falls.  Memory and ability to understand (cognition).  Work and work Statistician.  Reproductive health. Screening  You may have the following tests or measurements:  Height, weight, and BMI.  Blood pressure.  Lipid and cholesterol levels. These may be checked every 5 years, or more frequently if you are over 87 years old.  Skin check.  Lung cancer screening. You may have this screening every year starting at age 77 if you have a 30-pack-year history of smoking and currently smoke or have quit within the past 15 years.  Fecal occult blood test (FOBT) of the stool. You may have this test every year starting at age 77.  Flexible sigmoidoscopy or colonoscopy. You may have a sigmoidoscopy every 5 years or a colonoscopy every 10 years starting at age 36.  Hepatitis C blood test.  Hepatitis B blood test.  Sexually transmitted disease (STD) testing.  Diabetes screening. This is done by checking your blood sugar (glucose) after you have not eaten for a while (fasting). You may have this done every 1-3 years.  Bone density scan. This is done to screen for osteoporosis. You may have this done starting at  age 10.  Mammogram. This may be done every 1-2 years. Talk to your health care provider about how often you should have regular mammograms. Talk with your health care provider about your test results, treatment options, and if necessary, the need for more  tests. Vaccines  Your health care provider may recommend certain vaccines, such as:  Influenza vaccine. This is recommended every year.  Tetanus, diphtheria, and acellular pertussis (Tdap, Td) vaccine. You may need a Td booster every 10 years.  Zoster vaccine. You may need this after age 17.  Pneumococcal 13-valent conjugate (PCV13) vaccine. One dose is recommended after age 7.  Pneumococcal polysaccharide (PPSV23) vaccine. One dose is recommended after age 56. Talk to your health care provider about which screenings and vaccines you need and how often you need them. This information is not intended to replace advice given to you by your health care provider. Make sure you discuss any questions you have with your health care provider. Document Released: 10/17/2015 Document Revised: 06/09/2016 Document Reviewed: 07/22/2015 Elsevier Interactive Patient Education  2017 [place] Prevention in the Home Falls can cause injuries. They can happen to people of all ages. There are many things you can do to make your home safe and to help prevent falls. What can I do on the outside of my home?  Regularly fix the edges of walkways and driveways and fix any cracks.  Remove anything that might make you trip as you walk through a door, such as a raised step or threshold.  Trim any bushes or trees on the path to your home.  Use bright outdoor lighting.  Clear any walking paths of anything that might make someone trip, such as rocks or tools.  Regularly check to see if handrails are loose or broken. Make sure that both sides of any steps have handrails.  Any raised decks and porches should have guardrails on the edges.  Have any leaves, snow, or ice cleared regularly.  Use sand or salt on walking paths during winter.  Clean up any spills in your garage right away. This includes oil or grease spills. What can I do in the bathroom?  Use night lights.  Install grab bars by the  toilet and in the tub and shower. Do not use towel bars as grab bars.  Use non-skid mats or decals in the tub or shower.  If you need to sit down in the shower, use a plastic, non-slip stool.  Keep the floor dry. Clean up any water that spills on the floor as soon as it happens.  Remove soap buildup in the tub or shower regularly.  Attach bath mats securely with double-sided non-slip rug tape.  Do not have throw rugs and other things on the floor that can make you trip. What can I do in the bedroom?  Use night lights.  Make sure that you have a light by your bed that is easy to reach.  Do not use any sheets or blankets that are too big for your bed. They should not hang down onto the floor.  Have a firm chair that has side arms. You can use this for support while you get dressed.  Do not have throw rugs and other things on the floor that can make you trip. What can I do in the kitchen?  Clean up any spills right away.  Avoid walking on wet floors.  Keep items that you use a lot in easy-to-reach places.  If you  need to reach something above you, use a strong step stool that has a grab bar.  Keep electrical cords out of the way.  Do not use floor polish or wax that makes floors slippery. If you must use wax, use non-skid floor wax.  Do not have throw rugs and other things on the floor that can make you trip. What can I do with my stairs?  Do not leave any items on the stairs.  Make sure that there are handrails on both sides of the stairs and use them. Fix handrails that are broken or loose. Make sure that handrails are as long as the stairways.  Check any carpeting to make sure that it is firmly attached to the stairs. Fix any carpet that is loose or worn.  Avoid having throw rugs at the top or bottom of the stairs. If you do have throw rugs, attach them to the floor with carpet tape.  Make sure that you have a light switch at the top of the stairs and the bottom of  the stairs. If you do not have them, ask someone to add them for you. What else can I do to help prevent falls?  Wear shoes that:  Do not have high heels.  Have rubber bottoms.  Are comfortable and fit you well.  Are closed at the toe. Do not wear sandals.  If you use a stepladder:  Make sure that it is fully opened. Do not climb a closed stepladder.  Make sure that both sides of the stepladder are locked into place.  Ask someone to hold it for you, if possible.  Clearly mark and make sure that you can see:  Any grab bars or handrails.  First and last steps.  Where the edge of each step is.  Use tools that help you move around (mobility aids) if they are needed. These include:  Canes.  Walkers.  Scooters.  Crutches.  Turn on the lights when you go into a dark area. Replace any light bulbs as soon as they burn out.  Set up your furniture so you have a clear path. Avoid moving your furniture around.  If any of your floors are uneven, fix them.  If there are any pets around you, be aware of where they are.  Review your medicines with your doctor. Some medicines can make you feel dizzy. This can increase your chance of falling. Ask your doctor what other things that you can do to help prevent falls. This information is not intended to replace advice given to you by your health care provider. Make sure you discuss any questions you have with your health care provider. Document Released: 07/17/2009 Document Revised: 02/26/2016 Document Reviewed: 10/25/2014 Elsevier Interactive Patient Education  2017 [employer].

## 2021-03-11 NOTE — Progress Notes (Signed)
Subjective:   Kristina Gibbs is a 75 y.o. female who presents for Medicare Annual (Subsequent) preventive examination.  Virtual Visit via Telephone Note  I connected with  Kristina Gibbs on 03/11/21 at  8:15 AM EDT by telephone and verified that I am speaking with the correct person using two identifiers.  Location: Patient: Home Provider: [facility] Persons participating in the virtual visit: patient/Nurse Health Advisor   I discussed the limitations, risks, security and privacy concerns of performing an evaluation and management service by telephone and the availability of in person appointments. The patient expressed understanding and agreed to proceed.  Interactive audio and video telecommunications were attempted between this nurse and patient, however failed, due to patient having technical difficulties OR patient did not have access to video capability.  We continued and completed visit with audio only.  Some vital signs may be absent or patient reported.   [place] Ndiaye E Alyan Hartline, LPN   Review of Systems     Cardiac Risk Factors include: advanced age (>11men, >9 women);dyslipidemia;hypertension;sedentary lifestyle     Objective:    Today's Vitals   03/11/21 0823  Weight: 149 lb (67.6 kg)  Height: 5\' 2"  (1.575 m)   Body mass index is 27.25 kg/m.  Advanced Directives 03/11/2021 12/04/2020 12/04/2020 12/04/2020 11/21/2019 09/18/2019 04/17/2018  Does Patient Have a Medical Advance Directive? Yes No No No Yes Yes No  Type of Paramedic of [place];Living will - - - [place];Living will - -  Does patient want to make changes to medical advance directive? - - - - No - Patient declined - -  Copy of [place] in Chart? No - copy requested - - - No - copy requested - -  Would patient like information on creating a medical advance directive? - No - Patient declined No - Patient declined - - - Yes (MAU/Ambulatory/Procedural  Areas - Information given)    Current Medications (verified) Outpatient Encounter Medications as of 03/11/2021  Medication Sig  . alendronate (FOSAMAX) 70 MG tablet TAKE 1 TABLET (70 MG TOTAL) BY MOUTH EVERY 7 (SEVEN) DAYS. TAKE WITH A FULL GLASS OF WATER ON AN EMPTY STOMACH. DO NOT LIE DOWN FOR 1 HOUR AFTER TAKING.  Marland Kitchen amLODipine (NORVASC) 2.5 MG tablet Take 1 tablet (2.5 mg total) by mouth daily. TAKE 1 TABLET BY MOUTH EVERY DAY  . aspirin EC 81 MG tablet Take 81 mg by mouth at bedtime.   Marland Kitchen b complex vitamins tablet Take 1 tablet by mouth daily.   . calcium carbonate (OS-CAL) 600 MG TABS tablet Take 1 tablet (600 mg total) by mouth daily.  . cetirizine (ZYRTEC ALLERGY) 10 MG tablet Take 1 tablet (10 mg total) by mouth daily.  . cholecalciferol (VITAMIN D) 1000 UNITS tablet Take 1,000 Units by mouth daily.  . CO ENZYME Q-10 PO Take 1 capsule by mouth daily.   Marland Kitchen ELIQUIS 5 MG TABS tablet TAKE 1 TABLET BY MOUTH TWO TIMES DAILY  . memantine (NAMENDA) 10 MG tablet TAKE 1 TABLET BY MOUTH TWICE A DAY  . metoprolol succinate (TOPROL-XL) 50 MG 24 hr tablet Take 1 tablet (50 mg total) by mouth in the morning and at bedtime.  . nitroGLYCERIN (NITROLINGUAL) 0.4 MG/SPRAY spray PLACE 1 SPRAY UNDER THE TONGUE EVERY 5 (FIVE) MINUTES AS NEEDED FOR CHEST PAIN.  Marland Kitchen omega-3 acid ethyl esters (LOVAZA) 1 g capsule TAKE 1 CAPSULE (1 G TOTAL) BY MOUTH 4 (FOUR) TIMES DAILY.  . pantoprazole (  PROTONIX) 40 MG tablet Take 1 tablet (40 mg total) by mouth 2 (two) times daily.  Marland Kitchen PATADAY 0.2 % SOLN Place 1 drop into both eyes 2 (two) times daily.  Marland Kitchen RANEXA 500 MG 12 hr tablet TAKE 1 TABLET BY MOUTH TWICE A DAY  . rosuvastatin (CRESTOR) 20 MG tablet Take 1 tablet (20 mg total) by mouth daily.  . sodium chloride (OCEAN) 0.65 % SOLN nasal spray Place 1 spray into both nostrils as needed for congestion.  . vitamin C (ASCORBIC ACID) 500 MG tablet Take 1,000 mg by mouth daily.    No facility-administered encounter medications on  file as of 03/11/2021.    Allergies (verified) Codeine, Hydrocortisone, and Lactose intolerance (gi)   History: Past Medical History:  Diagnosis Date  . Allergy   . Arthritis   . Cancer ([place])    ovarian  . Coronary artery disease   . Heart murmur   . Hypercholesteremia   . Hypertension   . 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 Osteopenia   . Stomach problems   . 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 Superficial basal cell carcinoma (BCC) 03/30/1999   Left Thigh (treatment)  . Unstable angina ([place]) 05/27/2014   Past Surgical History:  Procedure Laterality Date  . ABDOMINAL HYSTERECTOMY    . CARDIOVERSION N/A 01/25/2018   Procedure: CARDIOVERSION;  Surgeon: Acie Fredrickson Wonda Cheng, MD;  Location: [facility];  Service: Cardiovascular;  Laterality: N/A;  . CHOLECYSTECTOMY    . CORONARY ARTERY BYPASS GRAFT    . EYE SURGERY     bilateral cataract extraction  . HAND SURGERY Left    injurred hand at work- Programmer, applications  . hysterectomy    . LEFT HEART CATHETERIZATION WITH CORONARY ANGIOGRAM N/A 05/27/2014   Procedure: LEFT HEART CATHETERIZATION WITH CORONARY ANGIOGRAM;  Surgeon: [name], MD;  Location: [facility] CATH LAB;  Service: Cardiovascular;  Laterality: N/A;  . PCTA     x7    Family History  Problem Relation Age of Onset  . Stroke Mother   . Diabetes Mother   . Kidney disease Mother   . Hypertension Mother   . Hyperlipidemia Mother   . Dementia Mother   . Heart disease Daughter        heart attack x2  . Heart disease Brother        2 heart attacks  . Breast cancer Maternal Aunt   . Colon cancer Neg Hx   . Stomach cancer Neg Hx   . Esophageal cancer Neg Hx   . Rectal cancer Neg Hx   . Liver cancer Neg Hx    Social History   Socioeconomic History  . Marital status: Married    Spouse name: [name]   . Number of children: 3  . Years of education: 61  . Highest education level: High school graduate  Occupational History  . Occupation:  Retired  Tobacco Use  . Smoking status: Former Smoker    Packs/day: 0.50    Years: 49.00    Pack years: 24.50    Types: Cigarettes    Quit date: 10/04/2008    Years since quitting: 12.4  . Smokeless tobacco: Never Used  Vaping Use  . Vaping Use: Never used  Substance and Sexual Activity  . Alcohol use: Yes    Alcohol/week: 1.0 standard drink    Types: 1 Glasses of wine per week    Comment: red wine once per month  .  Drug use: No  . Sexual activity: Not Currently    Birth control/protection: Surgical  Other Topics Concern  . Not on file  Social History Narrative   Lives w/ husband   Caffeine use: 3 cups coffee/day   Social Determinants of Health   Financial Resource Strain: Low Risk   . Difficulty of Paying Living Expenses: Not hard at all  Food Insecurity: No Food Insecurity  . Worried About Charity fundraiser in the Last Year: Never true  . Ran Out of Food in the Last Year: Never true  Transportation Needs: No Transportation Needs  . Lack of Transportation (Medical): No  . Lack of Transportation (Non-Medical): No  Physical Activity: Sufficiently Active  . Days of Exercise per Week: 7 days  . Minutes of Exercise per Session: 30 min  Stress: No Stress Concern Present  . Feeling of Stress : Not at all  Social Connections: Socially Integrated  . Frequency of Communication with Friends and Family: More than three times a week  . Frequency of Social Gatherings with Friends and Family: More than three times a week  . Attends Religious Services: More than 4 times per year  . Active Member of Clubs or Organizations: Yes  . Attends Archivist Meetings: More than 4 times per year  . Marital Status: Married    Tobacco Counseling Counseling given: Not Answered   Clinical Intake:  Pre-visit preparation completed: Yes  Pain : No/denies pain     BMI - recorded: 27.25 Nutritional Status: BMI 25 -29 Overweight Nutritional Risks: None Diabetes: No  How often  do you need to have someone help you when you read instructions, pamphlets, or other written materials from your doctor or pharmacy?: 1 - Never  Diabetic? No  Interpreter Needed?: No  Information entered by :: Teretha Chalupa, LPN   Activities of Daily Living In your present state of health, do you have any difficulty performing the following activities: 03/11/2021 12/04/2020  Hearing? N N  Vision? N N  Difficulty concentrating or making decisions? Y N  Walking or climbing stairs? N N  Dressing or bathing? N N  Doing errands, shopping? Y N  Preparing Food and eating ? N -  Using the Toilet? N -  In the past six months, have you accidently leaked urine? N -  Do you have problems with loss of bowel control? N -  Managing your Medications? N -  Managing your Finances? Y -  Housekeeping or managing your Housekeeping? N -  Some recent data might be hidden    Patient Care Team: [name], DO as PCP - General (Family Medicine) [name], MD as PCP - Cardiology (Cardiology) [name], MD as Consulting Physician (Cardiology) Milus Banister, MD as Attending Physician (Gastroenterology) Celestia Khat, OD (Optometry)  Indicate any recent Medical Services you may have received from other than [facility] providers in the past year (date may be approximate).     Assessment:   This is a routine wellness examination for [facility].  Hearing/Vision screen  Hearing Screening   125Hz  250Hz  500Hz  1000Hz  2000Hz  3000Hz  4000Hz  6000Hz  8000Hz   Right ear:           Left ear:           Comments: Denies hearing difficulties   Vision Screening Comments: Wears eyeglasses - behind on routine eye exam at [facility] in [place] issues and exercise activities discussed: Current Exercise Habits: Home exercise routine, Type of  exercise: walking, Time (Minutes): 10, Frequency (Times/Week): 7, Weekly Exercise (Minutes/Week): 70, Intensity: Mild, Exercise limited by: None  identified  Goals Addressed            This Visit's Progress   . Exercise 3x per week (30 min per time)   On track    Continue to walk for 30 minutes daily      Depression Screen PHQ 2/9 Scores 03/11/2021 03/05/2021 02/23/2021 12/31/2020 12/16/2020 09/02/2020 05/02/2020  PHQ - 2 Score 0 0 0 0 0 0 0  PHQ- 9 Score - - - 0 0 0 -    Fall Risk Fall Risk  03/11/2021 03/05/2021 02/23/2021 12/31/2020 09/02/2020  Falls in the past year? 0 0 0 0 0  Number falls in past yr: 0 - - - -  Comment - - - - -  Injury with Fall? 0 - - - -  Comment - - - - -  Risk for fall due to : No Fall Risks - - - -  Follow up Falls prevention discussed - - - -    FALL RISK PREVENTION PERTAINING TO THE HOME:  Any stairs in or around the home? No  If so, are there any without handrails? No  Home free of loose throw rugs in walkways, pet beds, electrical cords, etc? Yes  Adequate lighting in your home to reduce risk of falls? Yes   ASSISTIVE DEVICES UTILIZED TO PREVENT FALLS:  Life alert? No  Use of a cane, walker or w/c? No  Grab bars in the bathroom? No  Shower chair or bench in shower? No  Elevated toilet seat or a handicapped toilet? No   TIMED UP AND GO:  Was the test performed? No . Telephonic visit  Cognitive Function: Cognitive status assessed by direct observation. Patient has current diagnosis of cognitive impairment.   MMSE - Mini Mental State Exam 12/16/2020 09/02/2020 11/12/2019 09/12/2019 05/08/2019  Not completed: - - - - (No Data)  Orientation to time 2 4 3 4 4   Orientation to Place 2 5 3 4 3   Registration 3 3 3 3 3   Attention/ Calculation 3 3 1 3 1   Recall 1 1 0 2 1  Language- name 2 objects 2 2 2 2 2   Language- repeat 1 1 0 0 1  Language- follow 3 step command 3 3 3 3 2   Language- follow 3 step command-comments - - - - She held the paper in the air  Language- read & follow direction 1 1 1 1 1   Write a sentence 1 1 1 1 1   Copy design 1 1 1 1 1   Total score 20 25 18 24 20      6CIT Screen  11/21/2019  What Year? 0 points  What month? 0 points  What time? 0 points  Count back from 20 2 points  Months in reverse 4 points  Repeat phrase 2 points  Total Score 8    Immunizations Immunization History  Administered Date(s) Administered  . Fluad Quad(high Dose 65+) 06/06/2019, 07/11/2020  . Influenza, High Dose Seasonal PF 07/12/2016, 07/12/2017, 08/09/2018  . Influenza,inj,Quad PF,6+ Mos 07/30/2013, 07/02/2014, 07/07/2015  . Moderna Sars-Covid-2 Vaccination 02/04/2020, 03/04/2020  . Pneumococcal Conjugate-13 02/24/2015  . Pneumococcal Polysaccharide-23 03/23/2011  . Tdap 06/23/2010  . Zoster, Live 05/08/2010    TDAP status: Up to date  Flu Vaccine status: Up to date  Pneumococcal vaccine status: Up to date  Covid-19 vaccine status: Completed vaccines  Qualifies for Shingles Vaccine? Yes  Zostavax completed Yes   Shingrix Completed?: No.    Education has been provided regarding the importance of this vaccine. Patient has been advised to call insurance company to determine out of pocket expense if they have not yet received this vaccine. Advised may also receive vaccine at local pharmacy or Health Dept. Verbalized acceptance and understanding.  Screening Tests Health Maintenance  Topic Date Due  . Pneumococcal Vaccine 42-65 Years old (1 of 4 - PCV13) Never done  . Zoster Vaccines- Shingrix (1 of 2) Never done  . COVID-19 Vaccine (3 - Moderna risk 4-dose series) 04/01/2020  . TETANUS/TDAP  12/31/2021 (Originally 06/23/2020)  . INFLUENZA VACCINE  05/04/2021  . DEXA SCAN  12/17/2022  . Hepatitis C Screening  Completed  . PNA vac Low Risk Adult  Completed  . HPV VACCINES  Aged Out    Health Maintenance  Health Maintenance Due  Topic Date Due  . Pneumococcal Vaccine 46-80 Years old (1 of 4 - PCV13) Never done  . Zoster Vaccines- Shingrix (1 of 2) Never done  . COVID-19 Vaccine (3 - Moderna risk 4-dose series) 04/01/2020    Colorectal cancer screening: No longer  required.   Mammogram status: Completed 02/25/2021. Repeat every year  Bone Density status: Completed 12/16/2020. Results reflect: Bone density results: OSTEOPOROSIS. Repeat every 2 years.  Lung Cancer Screening: (Low Dose CT Chest recommended if Age 42-80 years, 30 pack-year currently smoking OR have quit w/in 15years.) does not qualify.   Additional Screening:  Hepatitis C Screening: does qualify; Completed 11/06/2015  Vision Screening: Recommended annual ophthalmology exams for early detection of glaucoma and other disorders of the eye. Is the patient up to date with their annual eye exam?  No  Who is the provider or what is the name of the office in which the patient attends annual eye exams? [facility] in [place] If pt is not established with a provider, would they like to be referred to a provider to establish care? No .   Dental Screening: Recommended annual dental exams for proper oral hygiene  Community Resource Referral / Chronic Care Management: CRR required this visit?  No   CCM required this visit?  No      Plan:     I have personally reviewed and noted the following in the patient's chart:   . Medical and social history . Use of alcohol, tobacco or illicit drugs  . Current medications and supplements including opioid prescriptions.  . Functional ability and status . Nutritional status . Physical activity . Advanced directives . List of other physicians . Hospitalizations, surgeries, and ER visits in previous 12 months . Vitals . Screenings to include cognitive, depression, and falls . Referrals and appointments  In addition, I have reviewed and discussed with patient certain preventive protocols, quality metrics, and best practice recommendations. A written personalized care plan for preventive services as well as general preventive health recommendations were provided to patient.     Sandrea Hammond, LPN   04/11/2955   Nurse Notes: None

## 2021-03-12 NOTE — Telephone Encounter (Signed)
Spoke to pharmacy, pharmacy is stating that they were trying to refill for old rx rather than the new 50mg  rx, pt has refill remaining, refill has been submitted prev req for new rx deleted.

## 2021-03-13 ENCOUNTER — Other Ambulatory Visit: Payer: Self-pay | Admitting: Family Medicine

## 2021-03-13 ENCOUNTER — Ambulatory Visit: Payer: Medicare HMO | Admitting: Family Medicine

## 2021-03-14 DIAGNOSIS — J209 Acute bronchitis, unspecified: Secondary | ICD-10-CM | POA: Diagnosis not present

## 2021-03-14 DIAGNOSIS — R059 Cough, unspecified: Secondary | ICD-10-CM | POA: Diagnosis not present

## 2021-03-16 ENCOUNTER — Encounter: Payer: Self-pay | Admitting: Family Medicine

## 2021-03-20 NOTE — Progress Notes (Signed)
Cardiology Clinic Note   Patient Name: Kristina Gibbs Date of Encounter: 03/23/2021  Primary Care Provider:  [name], DO Primary Cardiologist:  Quay Burow, MD  Patient Profile    Kristina Gibbs 75 year old female presents the clinic today for follow-up evaluation of her coronary artery disease status post CABG 12/1998 LIMA-LAD, SVG-marginal, SVG-distal RCA.  Past Medical History    Past Medical History:  Diagnosis Date   Allergy    Arthritis    Cancer ([place])    ovarian   Coronary artery disease    Heart murmur    Hypercholesteremia    Hypertension    Memory impairment     MMSE 27/30   Osteopenia    Stomach problems    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   Right Shin   Superficial basal cell carcinoma (BCC) 03/30/1999   Left Thigh (treatment)   Unstable angina ([place]) 05/27/2014   Past Surgical History:  Procedure Laterality Date   ABDOMINAL HYSTERECTOMY     CARDIOVERSION N/A 01/25/2018   Procedure: CARDIOVERSION;  Surgeon: Acie Fredrickson Wonda Cheng, MD;  Location: [place];  Service: Cardiovascular;  Laterality: N/A;   CHOLECYSTECTOMY     CORONARY ARTERY BYPASS GRAFT     EYE SURGERY     bilateral cataract extraction   HAND SURGERY Left    injurred hand at work- nerve repair   hysterectomy     LEFT HEART CATHETERIZATION WITH CORONARY ANGIOGRAM N/A 05/27/2014   Procedure: LEFT HEART CATHETERIZATION WITH CORONARY ANGIOGRAM;  Surgeon: [name], MD;  Location: [facility] CATH LAB;  Service: Cardiovascular;  Laterality: N/A;   PCTA     x7     Allergies  Allergies  Allergen Reactions   Codeine Nausea And Vomiting   Hydrocortisone Itching   Lactose Intolerance (Gi) Nausea And Vomiting and Nausea Only    History of Present Illness    Kristina Gibbs has a PMH of coronary artery disease status post CABG x3 12/1998, dyslipidemia, hypertension, and persistent atrial fibrillation.  She underwent cardiac catheterization in 2010 by Dr.  Gwenlyn Found which showed occluded vein grafts, patent LIMA, and normal LV function.  She was hospitalized 6/18 and ruled out for MI.  She was seen by Dr. Bronson Ing who recommended a stress test.  The stress test showed no ischemia.  She denied episodes of recurrent chest discomfort.  She was noted to have an episode of atrial fibrillation with RVR and underwent DCCV with successful conversion to NSR.  She was placed on Eliquis for oral anticoagulation.  She was last seen by Dr. Gwenlyn Found on 12/19/2020.  During that time she had just been admitted to the hospital with chest pain and A. fib with RVR.  She was ruled out for MI.  She was seen in consultation by Dr. Harl Bowie who felt her chest pain was referred and related to demand ischemia.  Her beta-blocker was adjusted to 50 mg twice daily.  Her husband reported that it was difficult to keep up with her medications due to her progressive dementia.  She presents the clinic today for follow-up evaluation states she feels well.  She presents with her husband.  She reports that she stays relatively physically active walking around her apartment building twice per day.  She does notice occasional palpitations.  Her EKG today shows atrial fibrillation 79 bpm.  We reviewed the EKG and they expressed understanding.  We reviewed her March hospitalization.  We discussed the importance of anticoagulation.  Her main  concern was with a hematoma on the medial aspect of her left knee.  Her husband reports that he area has gotten better.  We outlined the site and I talked about the importance of avoiding falls and the importance of evaluation in the event of head injury or MVA.  I will give the salty 6 diet sheet, have her maintain her physical activity, and follow-up in 6 months.  Today she denies chest pain, shortness of breath, lower extremity edema, fatigue, palpitations, melena, hematuria, hemoptysis, diaphoresis, weakness, presyncope, syncope, orthopnea, and PND.   Home  Medications    Prior to Admission medications   Medication Sig Start Date End Date Taking? Authorizing Provider  alendronate (FOSAMAX) 70 MG tablet TAKE 1 TABLET (70 MG TOTAL) BY MOUTH EVERY 7 (SEVEN) DAYS. TAKE WITH A FULL GLASS OF WATER ON AN EMPTY STOMACH. DO NOT LIE DOWN FOR 1 HOUR AFTER TAKING. 01/05/21   Ronnie Doss M, DO  amLODipine (NORVASC) 2.5 MG tablet Take 1 tablet (2.5 mg total) by mouth daily. TAKE 1 TABLET BY MOUTH EVERY DAY 02/27/21   [name], MD  aspirin EC 81 MG tablet Take 81 mg by mouth at bedtime.     [provider]  b complex vitamins tablet Take 1 tablet by mouth daily.     [provider]  calcium carbonate (OS-CAL) 600 MG TABS tablet Take 1 tablet (600 mg total) by mouth daily. 12/06/20   Barton Dubois, MD  cetirizine (ZYRTEC ALLERGY) 10 MG tablet Take 1 tablet (10 mg total) by mouth daily. 01/26/21   [name], DO  cholecalciferol (VITAMIN D) 1000 UNITS tablet Take 1,000 Units by mouth daily.    [provider]  CO ENZYME Q-10 PO Take 1 capsule by mouth daily.     [provider]  ELIQUIS 5 MG TABS tablet TAKE 1 TABLET BY MOUTH TWO TIMES DAILY 02/27/21   [name], MD  memantine (NAMENDA) 10 MG tablet TAKE 1 TABLET BY MOUTH TWICE A DAY 12/30/20   Ronnie Doss M, DO  metoprolol succinate (TOPROL-XL) 50 MG 24 hr tablet Take 1 tablet (50 mg total) by mouth in the morning and at bedtime. 12/19/20   [name], MD  nitroGLYCERIN (NITROLINGUAL) 0.4 MG/SPRAY spray PLACE 1 SPRAY UNDER THE TONGUE EVERY 5 (FIVE) MINUTES AS NEEDED FOR CHEST PAIN. 11/16/19   Ronnie Doss M, DO  omega-3 acid ethyl esters (LOVAZA) 1 g capsule TAKE 1 CAPSULE (1 G TOTAL) BY MOUTH 4 (FOUR) TIMES DAILY. 09/16/20   [name], MD  pantoprazole (PROTONIX) 40 MG tablet Take 1 tablet (40 mg total) by mouth 2 (two) times daily. 12/06/20   Barton Dubois, MD  PATADAY 0.2 % SOLN Place 1 drop into both eyes 2 (two) times daily.  08/22/14   [provider]  RANEXA 500 MG 12 hr tablet TAKE 1 TABLET BY MOUTH TWICE A DAY 01/09/21   [name], MD  rosuvastatin (CRESTOR) 20 MG tablet Take 1 tablet (20 mg total) by mouth daily. 11/25/20 02/23/21  [name], MD  sodium chloride (OCEAN) 0.65 % SOLN nasal spray Place 1 spray into both nostrils as needed for congestion. 01/02/21   Ivy Lynn, NP  vitamin C (ASCORBIC ACID) 500 MG tablet Take 1,000 mg by mouth daily.     [provider]    Family History    Family History  Problem Relation Age of Onset   Stroke Mother  Diabetes Mother    Kidney disease Mother    Hypertension Mother    Hyperlipidemia Mother    Dementia Mother    Heart disease Daughter        heart attack x2   Heart disease Brother        2 heart attacks   Breast cancer Maternal Aunt    Colon cancer Neg Hx    Stomach cancer Neg Hx    Esophageal cancer Neg Hx    Rectal cancer Neg Hx    Liver cancer Neg Hx    She indicated that her mother is deceased. She indicated that her sister is deceased. She indicated that her brother is alive. She indicated that her daughter is alive. She indicated that only one of her two sons is alive. She indicated that the status of her maternal aunt is unknown. She indicated that the status of her neg hx is unknown.  Social History    Social History   Socioeconomic History   Marital status: Married    Spouse name: [name]    Number of children: 3   Years of education: 12   Highest education level: High school graduate  Occupational History   Occupation: Retired  Tobacco Use   Smoking status: Former    Packs/day: 0.50    Years: 49.00    Pack years: 24.50    Types: Cigarettes    Quit date: 10/04/2008    Years since quitting: 12.4   Smokeless tobacco: Never  Vaping Use   Vaping Use: Never used  Substance and Sexual Activity   Alcohol use: Yes    Alcohol/week: 1.0 standard drink    Types: 1 Glasses of wine per week    Comment:  red wine once per month   Drug use: No   Sexual activity: Not Currently    Birth control/protection: Surgical  Other Topics Concern   Not on file  Social History Narrative   Lives w/ husband   Caffeine use: 3 cups coffee/day   Social Determinants of Health   Financial Resource Strain: Low Risk    Difficulty of Paying Living Expenses: Not hard at all  Food Insecurity: No Food Insecurity   Worried About Charity fundraiser in the Last Year: Never true   Ran Out of Food in the Last Year: Never true  Transportation Needs: No Transportation Needs   Lack of Transportation (Medical): No   Lack of Transportation (Non-Medical): No  Physical Activity: Sufficiently Active   Days of Exercise per Week: 7 days   Minutes of Exercise per Session: 30 min  Stress: No Stress Concern Present   Feeling of Stress : Not at all  Social Connections: Socially Integrated   Frequency of Communication with Friends and Family: More than three times a week   Frequency of Social Gatherings with Friends and Family: More than three times a week   Attends Religious Services: More than 4 times per year   Active Member of [employer] or Organizations: Yes   Attends Music therapist: More than 4 times per year   Marital Status: Married  Human resources officer Violence: Not At Risk   Fear of Current or Ex-Partner: No   Emotionally Abused: No   Physically Abused: No   Sexually Abused: No     Review of Systems    General:  No chills, fever, night sweats or weight changes.  Cardiovascular:  No chest pain, dyspnea on exertion, edema, orthopnea, palpitations, paroxysmal nocturnal dyspnea. Dermatological:  No rash, lesions/masses Respiratory: No cough, dyspnea Urologic: No hematuria, dysuria Abdominal:   No nausea, vomiting, diarrhea, bright red blood per rectum, melena, or hematemesis Neurologic:  No visual changes, wkns, changes in mental status. All other systems reviewed and are otherwise negative except as  noted above.  Physical Exam    VS:  BP 120/74 (BP Location: Left Arm, Patient Position: Sitting, Cuff Size: Normal)   Pulse 79   Resp 18   Ht 5\' 2"  (1.575 m)   Wt 148 lb (67.1 kg)   SpO2 97%   BMI 27.07 kg/m  , BMI Body mass index is 27.07 kg/m. GEN: Well nourished, well developed, in no acute distress. HEENT: normal. Neck: Supple, no JVD, carotid bruits, or masses. Cardiac: RRR, no murmurs, rubs, or gallops. No clubbing, cyanosis, edema.  Radials/DP/PT 2+ and equal bilaterally.  Respiratory:  Respirations regular and unlabored, clear to auscultation bilaterally. GI: Soft, nontender, nondistended, BS + x 4. MS: no deformity or atrophy. Skin: warm and dry, no rash. Neuro:  Strength and sensation are intact. Psych: Normal affect.  Accessory Clinical Findings    Recent Labs: 05/02/2020: TSH 2.750 12/04/2020: ALT 26; Hemoglobin 11.3; Platelets 240 12/16/2020: BUN 15; Creatinine, Ser 1.06; Potassium 4.6; Sodium 137   Recent Lipid Panel    Component Value Date/Time   CHOL 129 12/05/2020 0511   CHOL 155 05/02/2020 0944   CHOL 166 12/15/2012 1125   TRIG 36 12/05/2020 0511   TRIG 102 12/15/2012 1125   HDL 73 12/05/2020 0511   HDL 89 05/02/2020 0944   HDL 68 12/15/2012 1125   CHOLHDL 1.8 12/05/2020 0511   VLDL 7 12/05/2020 0511   LDLCALC 49 12/05/2020 0511   LDLCALC 55 05/02/2020 0944   LDLCALC 78 12/15/2012 1125    ECG personally reviewed by me today-atrial fibrillation septal infarct undetermined age 79 bpm- No acute changes  Echocardiogram 12/05/2020 IMPRESSIONS     1. Left ventricular ejection fraction, by estimation, is approximately  55%. The left ventricle has normal function. The left ventricle has no  regional wall motion abnormalities. Left ventricular diastolic parameters  are indeterminate.   2. Right ventricular systolic function is low normal. The right  ventricular size is mildly enlarged. There is mildly elevated pulmonary  artery systolic pressure. The  estimated right ventricular systolic  pressure is 08.1 mmHg.   3. Left atrial size was mild to moderately dilated.   4. Right atrial size was mildly dilated.   5. The mitral valve is grossly normal. Mild mitral valve regurgitation.   6. Tricuspid valve regurgitation is moderate.   7. The aortic valve is tricuspid. Aortic valve regurgitation is not  visualized.   8. The inferior vena cava is normal in size with greater than 50%  respiratory variability, suggesting right atrial pressure of 3 mmHg.     Assessment & Plan   1.  Coronary artery disease-status post CABG 12/1998.  Underwent cardiac catheterization by Dr. Gwenlyn Found 2010 which showed occluded vein grafts, patent LIMA and normal LV function.  Underwent stress testing 6/18 which showed low risk and normal EF.  Admitted to the hospital 12/04/2020 with chest pain and A. fib RVR.  Cardiac enzymes negative.  Felt to be chest pain related to elevated heart rate/demand ischemia. Continue metoprolol, aspirin, Ranexa, rosuvastatin, amlodipine, nitroglycerin Heart healthy low-sodium diet-salty 6 given Increase physical activity as tolerated  Atrial fibrillation-heart rate today 79.  No recent episodes of irregular or increased heart rate. Continue apixaban, metoprolol Heart healthy low-sodium  diet-salty 6 given Increase physical activity as tolerated Avoid triggers caffeine, chocolate, EtOH, dehydration etc.  Hyperlipidemia-12/05/2020: Cholesterol 129; HDL 73; LDL Cholesterol 49; Triglycerides 36; VLDL 7 Continue aspirin, rosuvastatin Heart healthy low-sodium high-fiber diet Increase physical activity as tolerated  Essential hypertension-BP today 120/74.  Well-controlled at home. Continue metoprolol, amlodipine, Heart healthy low-sodium diet-salty 6 given Increase physical activity as tolerated   Disposition: Follow-up with Dr. Gwenlyn Found in 6 months.  Jossie Ng. Jace Dowe NP-C    03/23/2021, 1:32 PM [facility] [address] Office [phone] Fax [phone]  Notice: This dictation was prepared with Dragon dictation along with smaller phrase technology. Any transcriptional errors that result from this process are unintentional and may not be corrected upon review.  I spent 14 minutes examining this patient, reviewing medications, and using patient centered shared decision making involving her cardiac care.  Prior to her visit I spent greater than 20 minutes reviewing her past medical history,  medications, and prior cardiac tests.

## 2021-03-23 ENCOUNTER — Encounter: Payer: Self-pay | Admitting: General Practice

## 2021-03-23 ENCOUNTER — Other Ambulatory Visit: Payer: Self-pay

## 2021-03-23 ENCOUNTER — Ambulatory Visit (INDEPENDENT_AMBULATORY_CARE_PROVIDER_SITE_OTHER): Payer: Medicare HMO | Admitting: General Practice

## 2021-03-23 VITALS — BP 120/74 | HR 79 | Resp 18 | Ht 62.0 in | Wt 148.0 lb

## 2021-03-23 DIAGNOSIS — I10 Essential (primary) hypertension: Secondary | ICD-10-CM

## 2021-03-23 DIAGNOSIS — I4819 Other persistent atrial fibrillation: Secondary | ICD-10-CM

## 2021-03-23 DIAGNOSIS — E785 Hyperlipidemia, unspecified: Secondary | ICD-10-CM

## 2021-03-23 DIAGNOSIS — Z951 Presence of aortocoronary bypass graft: Secondary | ICD-10-CM | POA: Diagnosis not present

## 2021-03-23 NOTE — Patient Instructions (Signed)
Medication Instructions:  The current medical regimen is effective;  continue present plan and medications as directed. Please refer to the Current Medication list given to you today. *If you need a refill on your cardiac medications before your next appointment, please call your pharmacy*  Special Instructions PLEASE READ AND FOLLOW SALTY 6-ATTACHED-1,800mg  daily  PLEASE MAINTAIN PHYSICAL ACTIVITY AS TOLERATED  Follow-Up: Your next appointment:  6 month(s) In Person with Quay Burow, MD ONLY  At [facility], you and your health needs are our priority.  As part of our continuing mission to provide you with exceptional heart care, we have created designated Provider Care Teams.  These Care Teams include your primary Cardiologist (physician) and Advanced Practice Providers (APPs -  Physician Assistants and Nurse Practitioners) who all work together to provide you with the care you need, when you need it.  We recommend signing up for the patient portal called "MyChart".  Sign up information is provided on this After Visit Summary.  MyChart is used to connect with patients for Virtual Visits (Telemedicine).  Patients are able to view lab/test results, encounter notes, upcoming appointments, etc.  Non-urgent messages can be sent to your provider as well.   To learn more about what you can do with MyChart, go to [email or web address].              6 SALTY THINGS TO AVOID     1,800MG  DAILY

## 2021-04-02 ENCOUNTER — Other Ambulatory Visit: Payer: Self-pay | Admitting: Family Medicine

## 2021-04-03 ENCOUNTER — Other Ambulatory Visit: Payer: Self-pay | Admitting: Family Medicine

## 2021-04-03 ENCOUNTER — Telehealth: Payer: Self-pay | Admitting: Cardiovascular Disease

## 2021-04-03 MED ORDER — METOPROLOL SUCCINATE ER 50 MG PO TB24: 50.0000 mg | ORAL_TABLET | Freq: Two times a day (BID) | ORAL | 1 refills | Status: DC

## 2021-04-03 MED ORDER — ROSUVASTATIN CALCIUM 20 MG PO TABS: 20.0000 mg | ORAL_TABLET | Freq: Every day | ORAL | 1 refills | Status: DC

## 2021-04-03 NOTE — Telephone Encounter (Signed)
     *  STAT* If patient is at the pharmacy, call can be transferred to refill team.   1. Which medications need to be refilled? (please list name of each medication and dose if known)   rosuvastatin (CRESTOR) 20 MG tablet   metoprolol succinate (TOPROL-XL) 50 MG 24 hr tablet  2. Which pharmacy/location (including street and city if local pharmacy) is medication to be sent to? [id number] - [place], [place] - [place]  3. Do they need a 30 day or 90 day supply? 90 days  Pt is out of meds, needs refill today

## 2021-04-10 ENCOUNTER — Telehealth: Payer: Self-pay | Admitting: Cardiovascular Disease

## 2021-04-10 MED ORDER — PANTOPRAZOLE SODIUM 40 MG PO TBEC: 40.0000 mg | DELAYED_RELEASE_TABLET | Freq: Two times a day (BID) | ORAL | 6 refills | Status: DC

## 2021-04-10 NOTE — Telephone Encounter (Signed)
Refills has been sent to the pharmacy. 

## 2021-04-10 NOTE — Telephone Encounter (Signed)
*  STAT* If patient is at the pharmacy, call can be transferred to refill team.   1. Which medications need to be refilled? (please list name of each medication and dose if known) pantoprazole (PROTONIX) 40 MG tablet  2. Which pharmacy/location (including street and city if local pharmacy) is medication to be sent to? [id number] - [place], [place]  3. Do they need a 30 day or 90 day supply? 30 ds

## 2021-04-23 ENCOUNTER — Ambulatory Visit: Payer: Medicare HMO | Admitting: Family Medicine

## 2021-05-04 ENCOUNTER — Encounter: Payer: Self-pay | Admitting: Family Medicine

## 2021-05-04 ENCOUNTER — Ambulatory Visit (INDEPENDENT_AMBULATORY_CARE_PROVIDER_SITE_OTHER): Payer: Medicare HMO | Admitting: Family Medicine

## 2021-05-04 ENCOUNTER — Other Ambulatory Visit: Payer: Self-pay

## 2021-05-04 VITALS — BP 139/78 | HR 96 | Temp 97.6°F | Ht 62.0 in | Wt 145.8 lb

## 2021-05-04 DIAGNOSIS — F0390 Unspecified dementia without behavioral disturbance: Secondary | ICD-10-CM | POA: Diagnosis not present

## 2021-05-04 DIAGNOSIS — I4819 Other persistent atrial fibrillation: Secondary | ICD-10-CM

## 2021-05-04 DIAGNOSIS — N1831 Chronic kidney disease, stage 3a: Secondary | ICD-10-CM | POA: Diagnosis not present

## 2021-05-04 DIAGNOSIS — F03B0 Unspecified dementia, moderate, without behavioral disturbance, psychotic disturbance, mood disturbance, and anxiety: Secondary | ICD-10-CM

## 2021-05-04 NOTE — Progress Notes (Signed)
Subjective: CC: HTN w/ CKD3 PCP: [name], DO [id number] L Kristina Gibbs is a 75 y.o. female presenting to clinic today for:  1. HTN w/ CKD3; constipation Compliant with her medications.  No chest pain, shortness of breath, dizziness.  She reports good urine output.  Sometimes has some constipation for which she takes over-the-counter stimulants.  2. Memory impairment Denies any urinary or fecal incontinence.  Feels that memory is essentially stable but still difficult.  Compliant with Namenda.  Her husband asked her to inquire as to whether or not there was anything stronger she could take.  She denies any visual or auditory hallucinations.   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 TAKE 1 TABLET BY MOUTH EVERY DAY, Disp: 30 tablet, Rfl: 3   aspirin EC 81 MG tablet, Take 81 mg by mouth at bedtime. , Disp: , Rfl:    b complex vitamins tablet, Take 1 tablet by mouth daily. , Disp: , Rfl:    calcium carbonate (OS-CAL) 600 MG TABS tablet, Take 1 tablet (600 mg total) by mouth daily., Disp: 30 tablet, Rfl: 3   cetirizine (ZYRTEC ALLERGY) 10 MG tablet, Take 1 tablet (10 mg total) by mouth daily., Disp: 90 tablet, Rfl: 3   cholecalciferol (VITAMIN D) 1000 UNITS tablet, Take 1,000 Units by mouth daily., Disp: , Rfl:     CO ENZYME Q-10 PO, Take 1 capsule by mouth daily. , Disp: , Rfl:    ELIQUIS 5 MG TABS tablet, TAKE 1 TABLET BY MOUTH TWO TIMES DAILY, Disp: 60 tablet, Rfl: 5   Ferrous Gluconate (IRON 27 PO), Take by mouth. Pt isn't sure of the dosage of otc iron taken daily., Disp: , Rfl:    memantine (NAMENDA) 10 MG tablet, TAKE 1 TABLET BY MOUTH TWICE A DAY, Disp: 180 tablet, Rfl: 0   metoprolol succinate (TOPROL-XL) 50 MG 24 hr tablet, Take 1 tablet (50 mg total) by mouth in the morning and at bedtime., Disp: 180 tablet, Rfl: 1   nitroGLYCERIN (NITROLINGUAL) 0.4 MG/SPRAY spray, PLACE 1 SPRAY UNDER THE TONGUE EVERY 5 (FIVE) MINUTES AS NEEDED FOR CHEST PAIN., Disp: 12 g, Rfl: 1   omega-3 acid ethyl esters (LOVAZA) 1 g capsule, TAKE 1 CAPSULE (1 G TOTAL) BY MOUTH 4 (FOUR) TIMES DAILY., Disp: 360 capsule, Rfl: 2   pantoprazole (PROTONIX) 40 MG tablet, Take 1 tablet (40 mg total) by mouth 2 (two) times daily., Disp: 60 tablet, Rfl: 6   PATADAY 0.2 % SOLN, Place 1 drop into both eyes 2 (two) times daily., Disp: , Rfl: 6   RANEXA 500 MG 12 hr tablet, TAKE 1 TABLET BY MOUTH TWICE A DAY, Disp: 180 tablet, Rfl: 2  rosuvastatin (CRESTOR) 20 MG tablet, Take 1 tablet (20 mg total) by mouth daily., Disp: 90 tablet, Rfl: 1   sodium chloride (OCEAN) 0.65 % SOLN nasal spray, Place 1 spray into both nostrils as needed for congestion., Disp: 60 mL, Rfl: 4   vitamin C (ASCORBIC ACID) 500 MG tablet, Take 1,000 mg by mouth daily. , Disp: , Rfl:  Social History   Socioeconomic History   Marital status: Married    Spouse name: [name]    Number of children: 3   Years of education: 12   Highest education level: High school graduate  Occupational History   Occupation: Retired  Tobacco Use   Smoking status: Former    Packs/day: 0.50    Years: 49.00    Pack years: 24.50    Types: Cigarettes    Quit date: 10/04/2008    Years since quitting: 12.5   Smokeless tobacco: Never  Vaping Use   Vaping Use: Never used  Substance and  Sexual Activity   Alcohol use: Yes    Alcohol/week: 1.0 standard drink    Types: 1 Glasses of wine per week    Comment: red wine once per month   Drug use: No   Sexual activity: Not Currently    Birth control/protection: Surgical  Other Topics Concern   Not on file  Social History Narrative   Lives w/ husband   Caffeine use: 3 cups coffee/day   Social Determinants of Health   Financial Resource Strain: Low Risk    Difficulty of Paying Living Expenses: Not hard at all  Food Insecurity: No Food Insecurity   Worried About Charity fundraiser in the Last Year: Never true   Ran Out of Food in the Last Year: Never true  Transportation Needs: No Transportation Needs   Lack of Transportation (Medical): No   Lack of Transportation (Non-Medical): No  Physical Activity: Sufficiently Active   Days of Exercise per Week: 7 days   Minutes of Exercise per Session: 30 min  Stress: No Stress Concern Present   Feeling of Stress : Not at all  Social Connections: Socially Integrated   Frequency of Communication with Friends and Family: More than three times a week   Frequency of Social Gatherings with Friends and Family: More than three times a week   Attends Religious Services: More than 4 times per year   Active Member of [employer] or Organizations: Yes   Attends Music therapist: More than 4 times per year   Marital Status: Married  Human resources officer Violence: Not At Risk   Fear of Current or Ex-Partner: No   Emotionally Abused: No   Physically Abused: No   Sexually Abused: No   Family History  Problem Relation Age of Onset   Stroke Mother    Diabetes Mother    Kidney disease Mother    Hypertension Mother    Hyperlipidemia Mother    Dementia Mother    Heart disease Daughter        heart attack x2   Heart disease Brother        2 heart attacks   Breast cancer Maternal Aunt    Colon cancer Neg Hx    Stomach cancer Neg Hx    Esophageal cancer Neg Hx    Rectal cancer Neg  Hx    Liver cancer Neg Hx     Objective: Office vital signs reviewed. BP 139/78   Pulse 96   Temp 97.6 F (36.4 C)  Ht '5\' 2"'$  (1.575 m)   Wt 145 lb 12.8 oz (66.1 kg)   SpO2 96%   BMI 26.67 kg/m   Physical Examination:  General: Awake, alert, well nourished, No acute distress HEENT: Normal; sclera white Cardio: regular rate and rhythm, S1S2 heard, no murmurs appreciated Pulm: clear to auscultation bilaterally, no wheezes, rhonchi or rales; normal work of breathing on room air GI: soft, non-tender, non-distended, bowel sounds present x4, no hepatomegaly, no splenomegaly, no masses MSK: Normal gait and normal station; ambulating independently Neuro: Oriented to self and place only.  Could not identify state, city, year or president.  Speech is somewhat circumferential and she seemed to be word searching.   Assessment/ Plan: 75 y.o. 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  She was oriented to self and place today.  Certainly some word finding difficulty on exam.  She is on max dose Namenda.  We discussed consideration for Aricept but not sure of the utility at this point.  Will discuss with her spouse.  We discussed the progressive nature of dementia.  Check vitamin D, CBC and renal function panel.  Urine output has been stable.  Continue avoiding nephrotoxic agents and continue hydrating well  Seem to be both regular rate and rhythm today.  No concerning symptoms or signs from a chronic anticoagulation standpoint.  Like her to follow-up again in 4 months and we will repeat MMSE at that point.  No orders of the defined types were placed in this encounter.  No orders of the defined types were placed in this encounter.  ** Spoke with  spouse.  Having a difficult time "remembering just about anything".  Offered referral back to neurology but he would like to hold off on this.  Considering OTC memory supplement.  I think this would be fine but advised to have whatever they select checked against her current meds by pharmacist to ensure no drug interactions.  [name], DO [place] [phone]

## 2021-05-04 NOTE — Patient Instructions (Signed)
There is a product called Aricept that we could add to your Namenda but you are on the max strength of the Namenda already.  Aricept is typically given to patients with memory loss that ALSO have mood disturbance.  As we discussed, there is nothing out there that reverses memory loss.  These medications are only intended to SLOW the progression of memory loss.  You may use Colace safely every day if needed to keep bowel movements regular and soft.

## 2021-05-05 LAB — CBC
Hematocrit: 43.1 % (ref 34.0–46.6)
Hemoglobin: 14.2 g/dL (ref 11.1–15.9)
MCH: 27.7 pg (ref 26.6–33.0)
MCHC: 32.9 g/dL (ref 31.5–35.7)
MCV: 84 fL (ref 79–97)
Platelets: 226 10*3/uL (ref 150–450)
RBC: 5.13 x10E6/uL (ref 3.77–5.28)
RDW: 16.9 % — ABNORMAL HIGH (ref 11.7–15.4)
WBC: 6.4 10*3/uL (ref 3.4–10.8)

## 2021-05-05 LAB — RENAL FUNCTION PANEL
Albumin: 4.4 g/dL (ref 3.7–4.7)
BUN/Creatinine Ratio: 13 (ref 12–28)
BUN: 14 mg/dL (ref 8–27)
CO2: 25 mmol/L (ref 20–29)
Calcium: 9.9 mg/dL (ref 8.7–10.3)
Chloride: 99 mmol/L (ref 96–106)
Creatinine, Ser: 1.09 mg/dL — ABNORMAL HIGH (ref 0.57–1.00)
Glucose: 101 mg/dL — ABNORMAL HIGH (ref 65–99)
Phosphorus: 3.9 mg/dL (ref 3.0–4.3)
Potassium: 4.9 mmol/L (ref 3.5–5.2)
Sodium: 137 mmol/L (ref 134–144)
eGFR: 53 mL/min/{1.73_m2} — ABNORMAL LOW (ref >59)

## 2021-05-05 LAB — VITAMIN D 25 HYDROXY (VIT D DEFICIENCY, FRACTURES): Vit D, 25-Hydroxy: 50.7 ng/mL (ref 30.0–100.0)

## 2021-05-05 LAB — TSH: TSH: 2.76 u[IU]/mL (ref 0.450–4.500)

## 2021-05-11 ENCOUNTER — Other Ambulatory Visit: Payer: Self-pay

## 2021-05-11 ENCOUNTER — Ambulatory Visit (INDEPENDENT_AMBULATORY_CARE_PROVIDER_SITE_OTHER): Payer: Medicare HMO | Admitting: Family Medicine

## 2021-05-11 ENCOUNTER — Encounter: Payer: Self-pay | Admitting: Family Medicine

## 2021-05-11 VITALS — BP 133/68 | HR 90 | Temp 97.4°F | Ht 62.0 in | Wt 150.4 lb

## 2021-05-11 DIAGNOSIS — R609 Edema, unspecified: Secondary | ICD-10-CM | POA: Diagnosis not present

## 2021-05-11 MED ORDER — FUROSEMIDE 20 MG PO TABS: 20.0000 mg | ORAL_TABLET | Freq: Every day | ORAL | 3 refills | Status: DC

## 2021-05-11 NOTE — Patient Instructions (Signed)
Peripheral Edema Peripheral edema is swelling that is caused by a buildup of fluid. Peripheral edema most often affects the lower legs, ankles, and feet. It can also develop in the arms, hands, and face. The area of the body that has peripheral edema will look swollen. It may also feel heavy or warm. Your clothes may start to feel tight. Pressing on the area may make a temporary dent in your skin. You may not be able to move your swollen arm or leg as much as usual. There are many causes of peripheral edema. It can happen because of a complication of other conditions such as congestive heart failure, kidney disease, or a problem with your blood circulation. It also can be a side effect of certain medicines or because of an infection. It often happens to women during pregnancy. Sometimes, the cause is not known. Follow these instructions at home: Managing pain, stiffness, and swelling  Raise (elevate) your legs while you are sitting or lying down. Move around often to prevent stiffness and to lessen swelling. Do not sit or stand for long periods of time. Wear support stockings as told by your health care provider. Medicines Take over-the-counter and prescription medicines only as told by your health care provider. Your health care provider may prescribe medicine to help your body get rid of excess water (diuretic). General instructions Pay attention to any changes in your symptoms. Follow instructions from your health care provider about limiting salt (sodium) in your diet. Sometimes, eating less salt may reduce swelling. Moisturize skin daily to help prevent skin from cracking and draining. Keep all follow-up visits as told by your health care provider. This is important. Contact a health care provider if you have: A fever. Edema that starts suddenly or is getting worse, especially if you are pregnant or have a medical condition. Swelling in only one leg. Increased swelling, redness, or pain in  one or both of your legs. Drainage or sores at the area where you have edema. Get help right away if you: Develop shortness of breath, especially when you are lying down. Have pain in your chest or abdomen. Feel weak. Feel faint. Summary Peripheral edema is swelling that is caused by a buildup of fluid. Peripheral edema most often affects the lower legs, ankles, and feet. Move around often to prevent stiffness and to lessen swelling. Do not sit or stand for long periods of time. Pay attention to any changes in your symptoms. Contact a health care provider if you have edema that starts suddenly or is getting worse, especially if you are pregnant or have a medical condition. Get help right away if you develop shortness of breath, especially when lying down. This information is not intended to replace advice given to you by your health care provider. Make sure you discuss any questions you have with your health care provider. Document Revised: 06/14/2018 Document Reviewed: 06/14/2018 Elsevier Patient Education  2022 Elsevier Inc.  

## 2021-05-11 NOTE — Progress Notes (Signed)
Acute Office Visit  Subjective:    Patient ID: Kristina Gibbs, female    DOB: [birth year], 75 y.o.   MRN: [id number]  Chief Complaint  Patient presents with   Edema    HPI Patient is in today for bilateral peripheral edema. She reports that she has had edema at night for quite some time. She has been having some edema throughout the day for the last month or so. She denies pain, erythema, warmth, fever, shortness of breath, or chest pain. She does elevate her legs when she sits. She has not been as active lately.   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   Right Shin   Superficial basal cell carcinoma (BCC) 03/30/1999   Left Thigh (treatment)   Unstable angina ([place]) 05/27/2014    Past Surgical History:  Procedure Laterality Date   ABDOMINAL HYSTERECTOMY     CARDIOVERSION N/A 01/25/2018   Procedure: CARDIOVERSION;  Surgeon: Acie Fredrickson Wonda Cheng, MD;  Location: [facility];  Service: Cardiovascular;  Laterality: N/A;   CHOLECYSTECTOMY     CORONARY ARTERY BYPASS GRAFT     EYE SURGERY     bilateral cataract extraction   HAND SURGERY Left    injurred hand at work- nerve repair   hysterectomy     LEFT HEART CATHETERIZATION WITH CORONARY ANGIOGRAM N/A 05/27/2014   Procedure: LEFT HEART CATHETERIZATION WITH CORONARY ANGIOGRAM;  Surgeon: [name], MD;  [facility];  Service: Cardiovascular;  Laterality: N/A;   PCTA     x7     Family History  Problem Relation Age of Onset   Stroke Mother    Diabetes Mother    Kidney disease Mother    Hypertension Mother    Hyperlipidemia Mother    Dementia Mother    Heart disease Daughter        heart attack x2   Heart disease Brother        2 heart attacks    Breast cancer Maternal Aunt    Colon cancer Neg Hx    Stomach cancer Neg Hx    Esophageal cancer Neg Hx    Rectal cancer Neg Hx    Liver cancer Neg Hx     Social History   Socioeconomic History   Marital status: Married    Spouse name: [name]    Number of children: 3   Years of education: 12   Highest education level: High school graduate  Occupational History   Occupation: Retired  Tobacco Use   Smoking status: Former    Packs/day: 0.50    Years: 49.00    Pack years: 24.50    Types: Cigarettes    Quit date: 10/04/2008    Years since quitting: 12.6   Smokeless tobacco: Never  Vaping Use   Vaping Use: Never used  Substance and Sexual Activity   Alcohol use: Yes    Alcohol/week: 1.0 standard drink    Types: 1 Glasses of wine per week    Comment: red wine once per month   Drug use: No   Sexual activity: Not Currently    Birth control/protection: Surgical  Other Topics  Concern   Not on file  Social History Narrative   Lives w/ husband   Caffeine use: 3 cups coffee/day   Social Determinants of Health   Financial Resource Strain: Low Risk    Difficulty of Paying Living Expenses: Not hard at all  Food Insecurity: No Food Insecurity   Worried About Charity fundraiser in the Last Year: Never true   Arboriculturist in the Last Year: Never true  Transportation Needs: No Transportation Needs   Lack of Transportation (Medical): No   Lack of Transportation (Non-Medical): No  Physical Activity: Sufficiently Active   Days of Exercise per Week: 7 days   Minutes of Exercise per Session: 30 min  Stress: No Stress Concern Present   Feeling of Stress : Not at all  Social Connections: Socially Integrated   Frequency of Communication with Friends and Family: More than three times a week   Frequency of Social Gatherings with Friends and Family: More than three times a week   Attends Religious Services: More than 4 times per year   Active Member of [employer] or Organizations: Yes    Attends Music therapist: More than 4 times per year   Marital Status: Married  Human resources officer Violence: Not At Risk   Fear of Current or Ex-Partner: No   Emotionally Abused: No   Physically Abused: No   Sexually Abused: No    Outpatient Medications Prior to Visit  Medication Sig Dispense Refill   amLODipine (NORVASC) 2.5 MG tablet Take 1 tablet (2.5 mg total) by mouth daily. TAKE 1 TABLET BY MOUTH EVERY DAY 30 tablet 3   aspirin EC 81 MG tablet Take 81 mg by mouth at bedtime.      b complex vitamins tablet Take 1 tablet by mouth daily.      calcium carbonate (OS-CAL) 600 MG TABS tablet Take 1 tablet (600 mg total) by mouth daily. 30 tablet 3   cetirizine (ZYRTEC ALLERGY) 10 MG tablet Take 1 tablet (10 mg total) by mouth daily. 90 tablet 3   cholecalciferol (VITAMIN D) 1000 UNITS tablet Take 1,000 Units by mouth daily.     CO ENZYME Q-10 PO Take 1 capsule by mouth daily.      ELIQUIS 5 MG TABS tablet TAKE 1 TABLET BY MOUTH TWO TIMES DAILY 60 tablet 5   Ferrous Gluconate (IRON 27 PO) Take by mouth. Pt isn't sure of the dosage of otc iron taken daily.     memantine (NAMENDA) 10 MG tablet TAKE 1 TABLET BY MOUTH TWICE A DAY 180 tablet 0   metoprolol succinate (TOPROL-XL) 50 MG 24 hr tablet Take 1 tablet (50 mg total) by mouth in the morning and at bedtime. 180 tablet 1   nitroGLYCERIN (NITROLINGUAL) 0.4 MG/SPRAY spray PLACE 1 SPRAY UNDER THE TONGUE EVERY 5 (FIVE) MINUTES AS NEEDED FOR CHEST PAIN. 12 g 1   omega-3 acid ethyl esters (LOVAZA) 1 g capsule TAKE 1 CAPSULE (1 G TOTAL) BY MOUTH 4 (FOUR) TIMES DAILY. 360 capsule 2   pantoprazole (PROTONIX) 40 MG tablet Take 1 tablet (40 mg total) by mouth 2 (two) times daily. 60 tablet 6   PATADAY 0.2 % SOLN Place 1 drop into both eyes 2 (two) times daily.  6   RANEXA 500 MG 12 hr tablet TAKE 1 TABLET BY MOUTH TWICE A DAY 180 tablet 2   rosuvastatin (CRESTOR) 20 MG tablet Take 1 tablet (20 mg total) by mouth daily. 90 tablet 1  sodium  chloride (OCEAN) 0.65 % SOLN nasal spray Place 1 spray into both nostrils as needed for congestion. 60 mL 4   vitamin C (ASCORBIC ACID) 500 MG tablet Take 1,000 mg by mouth daily.      No facility-administered medications prior to visit.    Allergies  Allergen Reactions   Codeine Nausea And Vomiting   Hydrocortisone Itching   Lactose Intolerance (Gi) Nausea And Vomiting and Nausea Only    Review of Systems As per HPI.     Objective:    Physical Exam Vitals and nursing note reviewed.  Constitutional:      General: She is not in acute distress.    Appearance: She is not ill-appearing, toxic-appearing or diaphoretic.  Neck:     Vascular: No JVD.  Cardiovascular:     Rate and Rhythm: Normal rate and regular rhythm.     Heart sounds: Normal heart sounds. No murmur heard. Pulmonary:     Effort: Pulmonary effort is normal. No respiratory distress.     Breath sounds: Normal breath sounds.  Musculoskeletal:     Right lower leg: 1+ Edema present.     Left lower leg: 1+ Edema present.  Skin:    General: Skin is warm and dry.  Neurological:     Mental Status: She is alert and oriented to person, place, and time.  Psychiatric:        Mood and Affect: Mood normal.        Behavior: Behavior normal.    BP 133/68   Pulse 90   Temp (!) 97.4 F (36.3 C) (Oral)   Ht _0  (1.575 m)   Wt 150 lb 6 oz (68.2 kg)   BMI 27.50 kg/m  Wt Readings from Last 3 Encounters:  05/11/21 150 lb 6 oz (68.2 kg)  05/04/21 145 lb 12.8 oz (66.1 kg)  03/23/21 148 lb (67.1 kg)    There are no preventive care reminders to display for this patient.  There are no preventive care reminders to display for this patient.   Lab Results  Component Value Date   TSH 2.760 05/04/2021   Lab Results  Component Value Date   WBC 6.4 05/04/2021   HGB 14.2 05/04/2021   HCT 43.1 05/04/2021   MCV 84 05/04/2021   PLT 226 05/04/2021   Lab Results  Component Value Date   NA 137 05/04/2021   K 4.9  05/04/2021   CO2 25 05/04/2021   GLUCOSE 101 (H) 05/04/2021   BUN 14 05/04/2021   CREATININE 1.09 (H) 05/04/2021   BILITOT 0.9 12/04/2020   ALKPHOS 52 12/04/2020   AST 23 12/04/2020   ALT 26 12/04/2020   PROT 6.6 12/04/2020   ALBUMIN 4.4 05/04/2021   CALCIUM 9.9 05/04/2021   ANIONGAP 8 12/04/2020   EGFR 53 (L) 05/04/2021   Lab Results  Component Value Date   CHOL 129 12/05/2020   Lab Results  Component Value Date   HDL 73 12/05/2020   Lab Results  Component Value Date   LDLCALC 49 12/05/2020   Lab Results  Component Value Date   TRIG 36 12/05/2020   Lab Results  Component Value Date   CHOLHDL 1.8 12/05/2020   Lab Results  Component Value Date   HGBA1C 5.5 05/26/2014       Assessment & Plan:   Sherleen was seen today for edema.  Diagnoses and all orders for this visit:  Peripheral edema Had stable labs that were completed 1 week ago. Discussed  elevation, not sitting or standing for long periods. Lasix ordered as below. Follow up in 2 weeks to reassess and recheck BMP. Monitor BP at home.  -     furosemide (LASIX) 20 MG tablet; Take 1 tablet (20 mg total) by mouth daily.  The patient indicates understanding of these issues and agrees with the plan.  Gwenlyn Perking, FNP

## 2021-05-20 DIAGNOSIS — H524 Presbyopia: Secondary | ICD-10-CM | POA: Diagnosis not present

## 2021-05-20 DIAGNOSIS — H16223 Keratoconjunctivitis sicca, not specified as Sjogren's, bilateral: Secondary | ICD-10-CM | POA: Diagnosis not present

## 2021-05-20 DIAGNOSIS — Z961 Presence of intraocular lens: Secondary | ICD-10-CM | POA: Diagnosis not present

## 2021-05-30 ENCOUNTER — Other Ambulatory Visit: Payer: Self-pay | Admitting: Cardiovascular Disease

## 2021-06-29 ENCOUNTER — Other Ambulatory Visit: Payer: Self-pay | Admitting: Family Medicine

## 2021-06-29 DIAGNOSIS — M85851 Other specified disorders of bone density and structure, right thigh: Secondary | ICD-10-CM

## 2021-07-02 DIAGNOSIS — Z23 Encounter for immunization: Secondary | ICD-10-CM | POA: Diagnosis not present

## 2021-07-03 ENCOUNTER — Ambulatory Visit: Payer: Medicare HMO | Admitting: Cardiovascular Disease

## 2021-07-03 ENCOUNTER — Telehealth: Payer: Self-pay | Admitting: Cardiovascular Disease

## 2021-07-03 ENCOUNTER — Other Ambulatory Visit: Payer: Self-pay | Admitting: Family Medicine

## 2021-07-03 ENCOUNTER — Other Ambulatory Visit: Payer: Self-pay | Admitting: Cardiovascular Disease

## 2021-07-03 NOTE — Telephone Encounter (Signed)
Pt c/o medication issue:  1. Name of Medication:  metoprolol succinate (TOPROL-XL) 50 MG 24 hr tablet  2. How are you currently taking this medication (dosage and times per day)? 1 tablet daily   3. Are you having a reaction (difficulty breathing--STAT)? No   4. What is your medication issue? Kristina Gibbs is calling with Kristina Gibbs stating the pharmacy has given them 25 MG tablets instead of 50 MG's wanting to confirm what dose she should be on. If it is still 44 MG's he is requesting a new prescription be sent to the [facility] on file.

## 2021-07-03 NOTE — Telephone Encounter (Signed)
Called the patient and her husband. They stated that when they went to pick up her prescription that the pharmacy had Metoprolol Succinate 25 mg tid. She has been on Metoprolol Succinate 50 mg twice daily.  Call placed to the pharmacy. They stated that they has already discontinued the old prescription from her PCP. Metoprolol Succinate 50 mg bid is on file.  Patient made aware.

## 2021-07-07 ENCOUNTER — Telehealth: Payer: Self-pay | Admitting: Cardiovascular Disease

## 2021-07-07 NOTE — Telephone Encounter (Signed)
Spoke with patient and she just [facility], does not need filled at this time

## 2021-07-07 NOTE — Telephone Encounter (Signed)
*  STAT* If patient is at the pharmacy, call can be transferred to refill team.   1. Which medications need to be refilled? (please list name of each medication and dose if known) need a new prescription for  Metoprolol  2. Which pharmacy/location (including street and city if local pharmacy) is medication to be sent to? [facility] [place]  3. Do they need a 30 day or 90 day supply? 90 days and refills

## 2021-08-06 ENCOUNTER — Other Ambulatory Visit: Payer: Self-pay | Admitting: Family Medicine

## 2021-08-06 DIAGNOSIS — R609 Edema, unspecified: Secondary | ICD-10-CM

## 2021-08-20 ENCOUNTER — Other Ambulatory Visit: Payer: Self-pay | Admitting: Cardiovascular Disease

## 2021-08-20 NOTE — Telephone Encounter (Signed)
Prescription refill request for Eliquis received. Indication:Afib Last office visit:6/22 Scr:1.0 Age: 75 Weight:68.2 kg  Prescription refilled

## 2021-09-04 ENCOUNTER — Encounter: Payer: Self-pay | Admitting: Family Medicine

## 2021-09-04 ENCOUNTER — Ambulatory Visit (INDEPENDENT_AMBULATORY_CARE_PROVIDER_SITE_OTHER): Payer: Medicare HMO | Admitting: Family Medicine

## 2021-09-04 VITALS — BP 136/79 | HR 83 | Temp 97.9°F | Ht 62.0 in | Wt 145.8 lb

## 2021-09-04 DIAGNOSIS — I4819 Other persistent atrial fibrillation: Secondary | ICD-10-CM | POA: Diagnosis not present

## 2021-09-04 DIAGNOSIS — N1831 Chronic kidney disease, stage 3a: Secondary | ICD-10-CM

## 2021-09-04 DIAGNOSIS — R040 Epistaxis: Secondary | ICD-10-CM | POA: Diagnosis not present

## 2021-09-04 DIAGNOSIS — F03B0 Unspecified dementia, moderate, without behavioral disturbance, psychotic disturbance, mood disturbance, and anxiety: Secondary | ICD-10-CM

## 2021-09-04 NOTE — Patient Instructions (Signed)
Nasal saline spray for next couple weeks. If no improvement in nose bleeds, call me and I will place referral to ENT Referral replaced for neurologist for memory/ communication I will message her heart Dr today as well.

## 2021-09-04 NOTE — Progress Notes (Signed)
Subjective: CC: Follow-up memory, atrial fibrillation PCP: [name], DO UUE:[id number] L Kristina Gibbs is a 75 y.o. female is accompanied today's visit by her spouse.  She is presenting to clinic today for:  1.  Moderate dementia without behavioral disturbance Patient has been seen by neurology in the past.  She is currently treated with Namenda.  Her husband notes that her communication skills are of concern.  She seems to be having difficulty finding words sometimes and in fact could not remember her son-in-law's name nor her daughters married name recently.  She denies any visual or auditory hallucinations.  Her sleep is good.  No mood disturbance per her spouse's report.  The patient reports that she does worry a lot about her spouse's health and wants to make sure that he has been taking care of.  2.  Atrial fibrillation with CKD 3A Patient is compliant with her Eliquis 5 mg twice daily, metoprolol XL 50 mg daily.  She has been having some nosebleeds that can last up to an hour.  She describes the bleeds as scabs nasal bleeding.  Her husband wonders if she might need cauterization.  Not currently using any nasal sprays but does have saline nasal spray on hand if needed.  Urine output has been good   ROS: Per HPI  Allergies  Allergen Reactions   Codeine Nausea And Vomiting   Hydrocortisone Itching   Lactose Intolerance (Gi) Nausea And Vomiting and Nausea Only   Past Medical History:  Diagnosis Date   Allergy    Arthritis    Cancer ([place])    ovarian   Coronary artery disease    Heart murmur    Hypercholesteremia    Hypertension    Memory impairment     MMSE 27/30   Osteopenia    Stomach problems    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   Right Shin   Superficial basal cell carcinoma (BCC) 03/30/1999   Left Thigh (treatment)   Unstable angina ([place]) 05/27/2014    Current Outpatient Medications:    amLODipine (NORVASC) 2.5 MG tablet, TAKE 1 TABLET (2.5 MG  TOTAL) BY MOUTH DAILY., Disp: 90 tablet, Rfl: 1   aspirin EC 81 MG tablet, Take 81 mg by mouth at bedtime. , Disp: , Rfl:    b complex vitamins tablet, Take 1 tablet by mouth daily. , Disp: , Rfl:    calcium carbonate (OS-CAL) 600 MG TABS tablet, Take 1 tablet (600 mg total) by mouth daily., Disp: 30 tablet, Rfl: 3   cetirizine (ZYRTEC ALLERGY) 10 MG tablet, Take 1 tablet (10 mg total) by mouth daily., Disp: 90 tablet, Rfl: 3   cholecalciferol (VITAMIN D) 1000 UNITS tablet, Take 1,000 Units by mouth daily., Disp: , Rfl:    CO ENZYME Q-10 PO, Take 1 capsule by mouth daily. , Disp: , Rfl:    ELIQUIS 5 MG TABS tablet, TAKE 1 TABLET BY MOUTH TWICE A DAY, Disp: 60 tablet, Rfl: 5   Ferrous Gluconate (IRON 27 PO), Take by mouth. Pt isn't sure of the dosage of otc iron taken daily., Disp: , Rfl:    furosemide (LASIX) 20 MG tablet, TAKE 1 TABLET BY MOUTH EVERY DAY, Disp: 90 tablet, Rfl: 0   memantine (NAMENDA) 10 MG tablet, TAKE 1 TABLET BY MOUTH TWICE A DAY, Disp: 180 tablet, Rfl: 0   metoprolol succinate (TOPROL-XL) 50 MG 24 hr tablet, Take 1 tablet (50 mg total) by mouth in the morning and at bedtime., Disp: 180 tablet,  Rfl: 1   nitroGLYCERIN (NITROLINGUAL) 0.4 MG/SPRAY spray, PLACE 1 SPRAY UNDER THE TONGUE EVERY 5 (FIVE) MINUTES AS NEEDED FOR CHEST PAIN., Disp: 12 g, Rfl: 1   omega-3 acid ethyl esters (LOVAZA) 1 g capsule, TAKE 1 CAPSULE (1 G TOTAL) BY MOUTH 4 (FOUR) TIMES DAILY., Disp: 360 capsule, Rfl: 2   pantoprazole (PROTONIX) 40 MG tablet, Take 1 tablet (40 mg total) by mouth 2 (two) times daily., Disp: 60 tablet, Rfl: 6   PATADAY 0.2 % SOLN, Place 1 drop into both eyes 2 (two) times daily., Disp: , Rfl: 6   RANEXA 500 MG 12 hr tablet, TAKE 1 TABLET BY MOUTH TWICE A DAY, Disp: 180 tablet, Rfl: 2   sodium chloride (OCEAN) 0.65 % SOLN nasal spray, Place 1 spray into both nostrils as needed for congestion., Disp: 60 mL, Rfl: 4   vitamin C (ASCORBIC ACID) 500 MG tablet, Take 1,000 mg by mouth daily. ,  Disp: , Rfl:    rosuvastatin (CRESTOR) 20 MG tablet, Take 1 tablet (20 mg total) by mouth daily., Disp: 90 tablet, Rfl: 1 Social History   Socioeconomic History   Marital status: Married    Spouse name: [name]    Number of children: 3   Years of education: 12   Highest education level: High school graduate  Occupational History   Occupation: Retired  Tobacco Use   Smoking status: Former    Packs/day: 0.50    Years: 49.00    Pack years: 24.50    Types: Cigarettes    Quit date: 10/04/2008    Years since quitting: 12.9   Smokeless tobacco: Never  Vaping Use   Vaping Use: Never used  Substance and Sexual Activity   Alcohol use: Yes    Alcohol/week: 1.0 standard drink    Types: 1 Glasses of wine per week    Comment: red wine once per month   Drug use: No   Sexual activity: Not Currently    Birth control/protection: Surgical  Other Topics Concern   Not on file  Social History Narrative   Lives w/ husband   Caffeine use: 3 cups coffee/day   Social Determinants of Health   Financial Resource Strain: Low Risk    Difficulty of Paying Living Expenses: Not hard at all  Food Insecurity: No Food Insecurity   Worried About Charity fundraiser in the Last Year: Never true   Ran Out of Food in the Last Year: Never true  Transportation Needs: No Transportation Needs   Lack of Transportation (Medical): No   Lack of Transportation (Non-Medical): No  Physical Activity: Sufficiently Active   Days of Exercise per Week: 7 days   Minutes of Exercise per Session: 30 min  Stress: No Stress Concern Present   Feeling of Stress : Not at all  Social Connections: Socially Integrated   Frequency of Communication with Friends and Family: More than three times a week   Frequency of Social Gatherings with Friends and Family: More than three times a week   Attends Religious Services: More than 4 times per year   Active Member of [employer] or Organizations: Yes   Attends Music therapist:  More than 4 times per year   Marital Status: Married  Human resources officer Violence: Not At Risk   Fear of Current or Ex-Partner: No   Emotionally Abused: No   Physically Abused: No   Sexually Abused: No   Family History  Problem Relation Age of Onset   Stroke  Mother    Diabetes Mother    Kidney disease Mother    Hypertension Mother    Hyperlipidemia Mother    Dementia Mother    Heart disease Daughter        heart attack x2   Heart disease Brother        2 heart attacks   Breast cancer Maternal Aunt    Colon cancer Neg Hx    Stomach cancer Neg Hx    Esophageal cancer Neg Hx    Rectal cancer Neg Hx    Liver cancer Neg Hx     Objective: Office vital signs reviewed. BP 136/79   Pulse 83   Temp 97.9 F (36.6 C)   Ht 5\' 2"  (1.575 m)   Wt 145 lb 12.8 oz (66.1 kg)   SpO2 98%   BMI 26.67 kg/m   Physical Examination:  General: Awake, alert, nontoxic elderly female, No acute distress HEENT: Sclera white.  Moist mucous membranes Cardio: Irregularly irregular with regular rate, S1S2 heard, no murmurs appreciated Pulm: clear to auscultation bilaterally, no wheezes, rhonchi or rales; normal work of breathing on room air Neuro: Interacts with provider.  Follows commands.  Does look to her husband for answers to some questions  Assessment/ Plan: 75 y.o. female   Moderate dementia without behavioral disturbance - Plan: Ambulatory referral to Neurology  Stage 3a chronic kidney disease ([place]) - Plan: Renal Function Panel, CBC  Persistent atrial fibrillation ([place])  Epistaxis - Plan: CBC  Referral back to neurology for further intervention if able.  We again discussed the progressive nature of dementia and the most recent changes may be simply normal progression of this disease.  However , glad to send for additional recommendations and support.  Check renal function panel, CBC given known CKD 3A  A. fib is rate controlled.  I do worry about the intermittent nasal bleeding and  have encouraged the patient to start using nasal saline spray, humidification.  If she continues to have nasal bleeding her husband will contact me and I am going to place referral to ENT.  We will reach out to her cardiologist as [facility] as well.  If she continues to have bleeding despite multiple interventions they may consider reduction of Eliquis dose.  No orders of the defined types were placed in this encounter.  No orders of the defined types were placed in this encounter.    [name], DO [place] [phone]

## 2021-09-05 LAB — RENAL FUNCTION PANEL
Albumin: 4 g/dL (ref 3.7–4.7)
BUN/Creatinine Ratio: 10 — ABNORMAL LOW (ref 12–28)
BUN: 11 mg/dL (ref 8–27)
CO2: 24 mmol/L (ref 20–29)
Calcium: 9 mg/dL (ref 8.7–10.3)
Chloride: 98 mmol/L (ref 96–106)
Creatinine, Ser: 1.07 mg/dL — ABNORMAL HIGH (ref 0.57–1.00)
Glucose: 106 mg/dL — ABNORMAL HIGH (ref 70–99)
Phosphorus: 3.4 mg/dL (ref 3.0–4.3)
Potassium: 4.5 mmol/L (ref 3.5–5.2)
Sodium: 134 mmol/L (ref 134–144)
eGFR: 54 mL/min/{1.73_m2} — ABNORMAL LOW (ref >59)

## 2021-09-05 LAB — CBC
Hematocrit: 38.9 % (ref 34.0–46.6)
Hemoglobin: 13 g/dL (ref 11.1–15.9)
MCH: 30.6 pg (ref 26.6–33.0)
MCHC: 33.4 g/dL (ref 31.5–35.7)
MCV: 92 fL (ref 79–97)
Platelets: 225 10*3/uL (ref 150–450)
RBC: 4.25 x10E6/uL (ref 3.77–5.28)
RDW: 12.2 % (ref 11.7–15.4)
WBC: 6.2 10*3/uL (ref 3.4–10.8)

## 2021-09-18 ENCOUNTER — Encounter: Payer: Self-pay | Admitting: Cardiovascular Disease

## 2021-09-18 ENCOUNTER — Other Ambulatory Visit: Payer: Self-pay

## 2021-09-18 ENCOUNTER — Ambulatory Visit: Payer: Medicare HMO | Admitting: Cardiovascular Disease

## 2021-09-18 DIAGNOSIS — I10 Essential (primary) hypertension: Secondary | ICD-10-CM | POA: Diagnosis not present

## 2021-09-18 DIAGNOSIS — Z951 Presence of aortocoronary bypass graft: Secondary | ICD-10-CM | POA: Diagnosis not present

## 2021-09-18 DIAGNOSIS — I4819 Other persistent atrial fibrillation: Secondary | ICD-10-CM

## 2021-09-18 DIAGNOSIS — E785 Hyperlipidemia, unspecified: Secondary | ICD-10-CM | POA: Diagnosis not present

## 2021-09-18 NOTE — Assessment & Plan Note (Signed)
History of dyslipidemia on statin therapy lipid profile performed 12/05/2020 revealing total cholesterol 129, LDL 49 and HDL 73.

## 2021-09-18 NOTE — Assessment & Plan Note (Signed)
History of persistent A-fib rate controlled on Eliquis oral anticoagulation. 

## 2021-09-18 NOTE — Assessment & Plan Note (Signed)
History of essential hypertension blood pressure measured today 124/74.  She is on amlodipine, and metoprolol.

## 2021-09-18 NOTE — Assessment & Plan Note (Signed)
History of CAD status post acute anterior myocardial infarction 1995 with percutaneous revascularization of her RCA and LAD at that time.  She ultimately required CABG in March 2000 by Dr. Roxan Hockey with a LIMA to LAD, vein to marginal branch of the circumflex and distal RCA.  She had a Myoview performed 03/06/2017 which was nonischemic.  She denies chest pain or shortness of breath.

## 2021-09-18 NOTE — Progress Notes (Signed)
09/18/2021 Kristina Gibbs   [birth year]  [id number]  Primary Physician [name], DO Primary Cardiologist: [name] MD FACP, [place], [place], [place]  HPI:  Kristina Gibbs is a 75 y.o.  mildly overweight Caucasian female with a history of CAD previously taken care of by Dr. Rollene Fare. I last saw her in the office 12/19/2020.  She is accompanied by her husband [name] today.  Unfortunately, she has had progressive dementia.  She has a history of anterior wall myocardial infarction in 1995 with subsequent percutaneous revascularization to her RCA and LAD. She ultimately requiredmultivessel bypass surgery by Dr. Roxan Hockey March of 2000 and LIMA to LAD, vein to a marginal branch of the circumflex and distal right coronary artery. Problems included hypertension and hyperlipidemia. I performed cardiac catheterization on her in 2010 revealing occluded vein grafts, patent LIMA and normal LV function.  Since I saw her in the office year ago she's remained medically stable with only a few episodes of nitrate responsive chest pain.   She was recently hospitalized overnight for chest pain 03/05/17. She ruled out for myocardial infarction. She was seen in consultation by Dr. Jacinta Shoe  recommended a Myoview stress test that was performed. This showed no ischemic ischemia. She's had no recurrent chest pain. Since I saw her in the office for months ago she's remained currently stable.  She was in A. fib with RVR when I saw her last and underwent DC cardioversion successfully to sinus rhythm.  She is on Eliquis oral anticoagulation.   She was admitted to the hospital on 12/04/2020 with chest pain, A. fib with RVR.  She ruled out for myocardial infarction.  She was seen in consultation by Dr. Harl Bowie who felt that her chest pain was referred related to demand ischemia and suggested altering her beta-blocker.  Her husband does say that it is difficult to keep up with her medications given her  progressive dementia.  Since I saw her in the office 9 months ago she continues to do well.  Her husband says that her dementia has been progressive.  She denies chest pain or shortness of breath.   Current Meds  Medication Sig   amLODipine (NORVASC) 2.5 MG tablet TAKE 1 TABLET (2.5 MG TOTAL) BY MOUTH DAILY.   aspirin EC 81 MG tablet Take 81 mg by mouth at bedtime.    b complex vitamins tablet Take 1 tablet by mouth daily.    calcium carbonate (OS-CAL) 600 MG TABS tablet Take 1 tablet (600 mg total) by mouth daily.   cetirizine (ZYRTEC ALLERGY) 10 MG tablet Take 1 tablet (10 mg total) by mouth daily.   cholecalciferol (VITAMIN D) 1000 UNITS tablet Take 1,000 Units by mouth daily.   CO ENZYME Q-10 PO Take 1 capsule by mouth daily.    ELIQUIS 5 MG TABS tablet TAKE 1 TABLET BY MOUTH TWICE A DAY   Ferrous Gluconate (IRON 27 PO) Take by mouth. Pt isn't sure of the dosage of otc iron taken daily.   furosemide (LASIX) 20 MG tablet TAKE 1 TABLET BY MOUTH EVERY DAY   memantine (NAMENDA) 10 MG tablet TAKE 1 TABLET BY MOUTH TWICE A DAY   metoprolol succinate (TOPROL-XL) 50 MG 24 hr tablet Take 1 tablet (50 mg total) by mouth in the morning and at bedtime.   nitroGLYCERIN (NITROLINGUAL) 0.4 MG/SPRAY spray PLACE 1 SPRAY UNDER THE TONGUE EVERY 5 (FIVE) MINUTES AS NEEDED FOR CHEST PAIN.   omega-3 acid ethyl esters (LOVAZA) 1  g capsule TAKE 1 CAPSULE (1 G TOTAL) BY MOUTH 4 (FOUR) TIMES DAILY.   pantoprazole (PROTONIX) 40 MG tablet Take 1 tablet (40 mg total) by mouth 2 (two) times daily.   RANEXA 500 MG 12 hr tablet TAKE 1 TABLET BY MOUTH TWICE A DAY   rosuvastatin (CRESTOR) 20 MG tablet Take 1 tablet (20 mg total) by mouth daily.   sodium chloride (OCEAN) 0.65 % SOLN nasal spray Place 1 spray into both nostrils as needed for congestion.   vitamin C (ASCORBIC ACID) 500 MG tablet Take 1,000 mg by mouth daily.      Allergies  Allergen Reactions   Codeine Nausea And Vomiting   Hydrocortisone Itching    Lactose Intolerance (Gi) Nausea And Vomiting and Nausea Only    Social History   Socioeconomic History   Marital status: Married    Spouse name: [name]    Number of children: 3   Years of education: 12   Highest education level: High school graduate  Occupational History   Occupation: Retired  Tobacco Use   Smoking status: Former    Packs/day: 0.50    Years: 49.00    Pack years: 24.50    Types: Cigarettes    Quit date: 10/04/2008    Years since quitting: 12.9   Smokeless tobacco: Never  Vaping Use   Vaping Use: Never used  Substance and Sexual Activity   Alcohol use: Yes    Alcohol/week: 1.0 standard drink    Types: 1 Glasses of wine per week    Comment: red wine once per month   Drug use: No   Sexual activity: Not Currently    Birth control/protection: Surgical  Other Topics Concern   Not on file  Social History Narrative   Lives w/ husband   Caffeine use: 3 cups coffee/day   Social Determinants of Health   Financial Resource Strain: Low Risk    Difficulty of Paying Living Expenses: Not hard at all  Food Insecurity: No Food Insecurity   Worried About Charity fundraiser in the Last Year: Never true   Ran Out of Food in the Last Year: Never true  Transportation Needs: No Transportation Needs   Lack of Transportation (Medical): No   Lack of Transportation (Non-Medical): No  Physical Activity: Sufficiently Active   Days of Exercise per Week: 7 days   Minutes of Exercise per Session: 30 min  Stress: No Stress Concern Present   Feeling of Stress : Not at all  Social Connections: Socially Integrated   Frequency of Communication with Friends and Family: More than three times a week   Frequency of Social Gatherings with Friends and Family: More than three times a week   Attends Religious Services: More than 4 times per year   Active Member of [employer] or Organizations: Yes   Attends Music therapist: More than 4 times per year   Marital Status: Married   Human resources officer Violence: Not At Risk   Fear of Current or Ex-Partner: No   Emotionally Abused: No   Physically Abused: No   Sexually Abused: No     Review of Systems: General: negative for chills, fever, night sweats or weight changes.  Cardiovascular: negative for chest pain, dyspnea on exertion, edema, orthopnea, palpitations, paroxysmal nocturnal dyspnea or shortness of breath Dermatological: negative for rash Respiratory: negative for cough or wheezing Urologic: negative for hematuria Abdominal: negative for nausea, vomiting, diarrhea, bright red blood per rectum, melena, or hematemesis Neurologic: negative for  visual changes, syncope, or dizziness All other systems reviewed and are otherwise negative except as noted above.    Blood pressure 124/74, pulse 90, height 5\' 2"  (1.575 m), weight 147 lb (66.7 kg), SpO2 98 %.  General appearance: alert and no distress Neck: no adenopathy, no carotid bruit, no JVD, supple, symmetrical, trachea midline, and thyroid not enlarged, symmetric, no tenderness/mass/nodules Lungs: clear to auscultation bilaterally Heart: irregularly irregular rhythm Extremities: extremities normal, atraumatic, no cyanosis or edema Pulses: 2+ and symmetric Skin: Skin color, texture, turgor normal. No rashes or lesions Neurologic: Grossly normal  EKG atrial fibrillation with a ventricular sponsor of 90, septal Q waves.  I personally reviewed this EKG.  ASSESSMENT AND PLAN:   Hx of CABG History of CAD status post acute anterior myocardial infarction 1995 with percutaneous revascularization of her RCA and LAD at that time.  She ultimately required CABG in March 2000 by Dr. Roxan Hockey with a LIMA to LAD, vein to marginal branch of the circumflex and distal RCA.  She had a Myoview performed 03/06/2017 which was nonischemic.  She denies chest pain or shortness of breath.  Dyslipidemia, goal LDL below 70 History of dyslipidemia on statin therapy lipid profile  performed 12/05/2020 revealing total cholesterol 129, LDL 49 and HDL 73.  Essential hypertension History of essential hypertension blood pressure measured today 124/74.  She is on amlodipine, and metoprolol.  Persistent atrial fibrillation History of persistent A. fib rate controlled on Eliquis oral anticoagulation.     [name] MD FACP,FACC,FAHA, [facility] 09/18/2021 10:24 AM

## 2021-09-18 NOTE — Patient Instructions (Signed)

## 2021-09-26 ENCOUNTER — Other Ambulatory Visit: Payer: Self-pay | Admitting: Cardiovascular Disease

## 2021-10-01 ENCOUNTER — Other Ambulatory Visit: Payer: Self-pay | Admitting: Family Medicine

## 2021-10-01 DIAGNOSIS — M85851 Other specified disorders of bone density and structure, right thigh: Secondary | ICD-10-CM

## 2021-10-25 ENCOUNTER — Other Ambulatory Visit: Payer: Self-pay | Admitting: General Practice

## 2021-10-30 ENCOUNTER — Other Ambulatory Visit: Payer: Self-pay | Admitting: General Practice

## 2021-11-04 ENCOUNTER — Other Ambulatory Visit: Payer: Self-pay

## 2021-11-04 ENCOUNTER — Encounter: Payer: Self-pay | Admitting: Dermatology

## 2021-11-04 ENCOUNTER — Ambulatory Visit: Payer: Medicare HMO | Admitting: Dermatology

## 2021-11-04 DIAGNOSIS — Q809 Congenital ichthyosis, unspecified: Secondary | ICD-10-CM

## 2021-11-04 DIAGNOSIS — Z85828 Personal history of other malignant neoplasm of skin: Secondary | ICD-10-CM

## 2021-11-04 DIAGNOSIS — Z1283 Encounter for screening for malignant neoplasm of skin: Secondary | ICD-10-CM

## 2021-11-04 DIAGNOSIS — L821 Other seborrheic keratosis: Secondary | ICD-10-CM | POA: Diagnosis not present

## 2021-11-04 DIAGNOSIS — L720 Epidermal cyst: Secondary | ICD-10-CM

## 2021-11-04 DIAGNOSIS — T148XXA Other injury of unspecified body region, initial encounter: Secondary | ICD-10-CM

## 2021-11-04 NOTE — Patient Instructions (Addendum)
PICK UP SOME OVER THE COUNTER DERMEND - [employer], [facility] ETC  IF CYST BY THE RIGHT EYE BECOMES IRRITABLE, PLEASE CALL AND SCHEDULE 30 MIN SURGERY APPT

## 2021-11-11 ENCOUNTER — Ambulatory Visit (INDEPENDENT_AMBULATORY_CARE_PROVIDER_SITE_OTHER): Payer: Self-pay | Admitting: Neurology

## 2021-11-11 DIAGNOSIS — Z91199 Patient's noncompliance with other medical treatment and regimen due to unspecified reason: Secondary | ICD-10-CM

## 2021-11-11 NOTE — Progress Notes (Signed)
Patient no-showed consult for memory loss

## 2021-11-16 ENCOUNTER — Encounter: Payer: Self-pay | Admitting: Neurology

## 2021-11-21 ENCOUNTER — Other Ambulatory Visit: Payer: Self-pay | Admitting: General Practice

## 2021-11-27 ENCOUNTER — Encounter: Payer: Self-pay | Admitting: Dermatology

## 2021-11-27 NOTE — Progress Notes (Signed)
° °  Follow-Up Visit   Subjective  Kristina Gibbs is a 76 y.o. female who presents for the following: Annual Exam (Lesion on near right eye x year, bleeding. Personal history of bcc.).  Check spot in the right eye plus general skin examination Location:  Duration:  Quality:  Associated Signs/Symptoms: Modifying Factors:  Severity:  Timing: Context:   Objective  Well appearing patient in no apparent distress; mood and affect are within normal limits. Left Lower Leg - Anterior, Right Lower Leg - Anterior Mild ecchymosis, said to be chronic.  Scalp Full body skin examination: No atypical pigmented lesions or nonmelanoma skin cancer.  Torso - Posterior (Back) Multiple 4 to 7 mm flattopped brown textured papules, compatible dermoscopy  Right Zygomatic Area 4 mm white dermal papule, noninflamed.    A full examination was performed including scalp, head, eyes, ears, nose, lips, neck, chest, axillae, abdomen, back, buttocks, bilateral upper extremities, bilateral lower extremities, hands, feet, fingers, toes, fingernails, and toenails. All findings within normal limits unless otherwise noted below.  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  Screening exam for skin cancer Scalp  Annual skin examination   Seborrheic keratosis Torso - Posterior (Back)  Leave if stable  Epidermoid cyst Right Zygomatic Area  She may choose to schedule excision in the future.      I, Lavonna Monarch, MD, have reviewed all documentation for this visit.  The documentation on 11/27/21 for the exam, diagnosis, procedures, and orders are all accurate and complete.

## 2021-12-27 ENCOUNTER — Other Ambulatory Visit: Payer: Self-pay | Admitting: Cardiovascular Disease

## 2022-01-04 ENCOUNTER — Ambulatory Visit (INDEPENDENT_AMBULATORY_CARE_PROVIDER_SITE_OTHER): Payer: Medicare HMO | Admitting: Family Medicine

## 2022-01-04 ENCOUNTER — Encounter: Payer: Self-pay | Admitting: Family Medicine

## 2022-01-04 VITALS — BP 139/76 | HR 84 | Temp 97.5°F | Ht 62.0 in | Wt 144.4 lb

## 2022-01-04 DIAGNOSIS — N1831 Chronic kidney disease, stage 3a: Secondary | ICD-10-CM | POA: Diagnosis not present

## 2022-01-04 DIAGNOSIS — Z23 Encounter for immunization: Secondary | ICD-10-CM

## 2022-01-04 DIAGNOSIS — F03B0 Unspecified dementia, moderate, without behavioral disturbance, psychotic disturbance, mood disturbance, and anxiety: Secondary | ICD-10-CM | POA: Diagnosis not present

## 2022-01-04 NOTE — Patient Instructions (Signed)
Dr Jaynee Eagles: Phone: [phone].  Call to schedule an appointment ? ?

## 2022-01-04 NOTE — Progress Notes (Signed)
? ?Subjective: ?CC: Follow-up Alzheimer's ?PCP: [name], DO ?FOY:DXAJOINOM Kristina Gibbs is a 76 y.o. female presenting to clinic today for: ? ?1.  Alzheimer's dementia ?Patient is accompanied today's visit by her spouse.  He worries about her progressive memory loss.  He notes that it does seem to be getting worse.  She was referred to her neurologist for recheck and to discuss any other options at this point but unfortunately they did not remember the appointment and no showed it.  They have not yet called to reschedule this.  She is currently being treated with Namenda 10 mg twice daily.  No mood disturbances reported.  She has not been a danger to herself or others.  He frequently tries to remind her to keep her keys around her neck and not to forget her purse.  He tries to keep more important things on his person so that they do not get lost.  He has considered getting her a medical alert bracelet or something similar with her information on it but was not sure what company to get this from. ? ? ?ROS: Per HPI ? ?Allergies  ?Allergen Reactions  ? Codeine Nausea And Vomiting  ? Hydrocortisone Itching  ? Lactose Intolerance (Gi) Nausea And Vomiting and Nausea Only  ? ?Past Medical History:  ?Diagnosis Date  ? Allergy   ? Arthritis   ? 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 Coronary artery disease   ? Heart murmur   ? Hypercholesteremia   ? Hypertension   ? Memory impairment   ?  MMSE 27/30  ? Osteopenia   ? Stomach problems   ? 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 Right Shin  ? Superficial basal cell carcinoma (BCC) 03/30/1999  ? Left Thigh (treatment)  ? Unstable angina ([place]) 05/27/2014  ? ? ?Current Outpatient Medications:  ?  amLODipine (NORVASC) 2.5 MG tablet, TAKE 1 TABLET BY MOUTH EVERY DAY, Disp: 90 tablet, Rfl: 3 ?  aspirin EC 81 MG tablet, Take 81 mg by mouth at bedtime. , Disp: , Rfl:  ?  b complex vitamins tablet, Take 1 tablet by mouth daily. , Disp: , Rfl:  ?  calcium carbonate (OS-CAL)  600 MG TABS tablet, Take 1 tablet (600 mg total) by mouth daily., Disp: 30 tablet, Rfl: 3 ?  cetirizine (ZYRTEC ALLERGY) 10 MG tablet, Take 1 tablet (10 mg total) by mouth daily., Disp: 90 tablet, Rfl: 3 ?  cholecalciferol (VITAMIN D) 1000 UNITS tablet, Take 1,000 Units by mouth daily., Disp: , Rfl:  ?  CO ENZYME Q-10 PO, Take 1 capsule by mouth daily. , Disp: , Rfl:  ?  ELIQUIS 5 MG TABS tablet, TAKE 1 TABLET BY MOUTH TWICE A DAY, Disp: 60 tablet, Rfl: 5 ?  Ferrous Gluconate (IRON 27 PO), Take by mouth. Pt isn't sure of the dosage of otc iron taken daily., Disp: , Rfl:  ?  furosemide (LASIX) 20 MG tablet, TAKE 1 TABLET BY MOUTH EVERY DAY, Disp: 90 tablet, Rfl: 0 ?  memantine (NAMENDA) 10 MG tablet, TAKE 1 TABLET BY MOUTH TWICE A DAY, Disp: 180 tablet, Rfl: 1 ?  metoprolol succinate (TOPROL-XL) 50 MG 24 hr tablet, TAKE 1 TABLET (50 MG TOTAL) BY MOUTH IN THE MORNING AND AT BEDTIME., Disp: 180 tablet, Rfl: 2 ?  nitroGLYCERIN (NITROLINGUAL) 0.4 MG/SPRAY spray, PLACE 1 SPRAY UNDER THE TONGUE EVERY 5 (FIVE) MINUTES AS NEEDED FOR CHEST PAIN., Disp: 12 g, Rfl: 1 ?  omega-3 acid ethyl  esters (LOVAZA) 1 g capsule, TAKE 1 CAPSULE (1 G TOTAL) BY MOUTH 4 (FOUR) TIMES DAILY., Disp: 360 capsule, Rfl: 2 ?  pantoprazole (PROTONIX) 40 MG tablet, TAKE 1 TABLET BY MOUTH TWICE A DAY, Disp: 180 tablet, Rfl: 2 ?  ranolazine (RANEXA) 500 MG 12 hr tablet, TAKE 1 TABLET BY MOUTH TWICE A DAY, Disp: 180 tablet, Rfl: 2 ?  sodium chloride (OCEAN) 0.65 % SOLN nasal spray, Place 1 spray into both nostrils as needed for congestion., Disp: 60 mL, Rfl: 4 ?  vitamin C (ASCORBIC ACID) 500 MG tablet, Take 1,000 mg by mouth daily. , Disp: , Rfl:  ?  PATADAY 0.2 % SOLN, Place 1 drop into both eyes 2 (two) times daily. (Patient not taking: Reported on 09/18/2021), Disp: , Rfl: 6 ?  rosuvastatin (CRESTOR) 20 MG tablet, Take 1 tablet (20 mg total) by mouth daily., Disp: 90 tablet, Rfl: 1 ?Social History  ? ?Socioeconomic History  ? Marital status: Married   ?  Spouse name: [name]   ? Number of children: 3  ? Years of education: 59  ? Highest education level: High school graduate  ?Occupational History  ? Occupation: Retired  ?Tobacco Use  ? Smoking status: Former  ?  Packs/day: 0.50  ?  Years: 49.00  ?  Pack years: 24.50  ?  Types: Cigarettes  ?  Quit date: 10/04/2008  ?  Years since quitting: 13.2  ? Smokeless tobacco: Never  ?Vaping Use  ? Vaping Use: Never used  ?Substance and Sexual Activity  ? Alcohol use: Yes  ?  Alcohol/week: 1.0 standard drink  ?  Types: 1 Glasses of wine per week  ?  Comment: red wine once per month  ? Drug use: No  ? Sexual activity: Not Currently  ?  Birth control/protection: Surgical  ?Other Topics Concern  ? Not on file  ?Social History Narrative  ? Lives w/ husband  ? Caffeine use: 3 cups coffee/day  ? ?Social Determinants of Health  ? ?Financial Resource Strain: Low Risk   ? Difficulty of Paying Living Expenses: Not hard at all  ?Food Insecurity: No Food Insecurity  ? Worried About Charity fundraiser in the Last Year: Never true  ? Ran Out of Food in the Last Year: Never true  ?Transportation Needs: No Transportation Needs  ? Lack of Transportation (Medical): No  ? Lack of Transportation (Non-Medical): No  ?Physical Activity: Sufficiently Active  ? Days of Exercise per Week: 7 days  ? Minutes of Exercise per Session: 30 min  ?Stress: No Stress Concern Present  ? Feeling of Stress : Not at all  ?Social Connections: Socially Integrated  ? Frequency of Communication with Friends and Family: More than three times a week  ? Frequency of Social Gatherings with Friends and Family: More than three times a week  ? Attends Religious Services: More than 4 times per year  ? Active Member of Clubs or Organizations: Yes  ? Attends Archivist Meetings: More than 4 times per year  ? Marital Status: Married  ?Intimate Partner Violence: Not At Risk  ? Fear of Current or Ex-Partner: No  ? Emotionally Abused: No  ? Physically Abused: No  ? Sexually  Abused: No  ? ?Family History  ?Problem Relation Age of Onset  ? Stroke Mother   ? Diabetes Mother   ? Kidney disease Mother   ? Hypertension Mother   ? Hyperlipidemia Mother   ? Dementia Mother   ? Heart  disease Daughter   ?     heart attack x2  ? Heart disease Brother   ?     2 heart attacks  ? Breast cancer Maternal Aunt   ? Colon cancer Neg Hx   ? Stomach cancer Neg Hx   ? Esophageal cancer Neg Hx   ? Rectal cancer Neg Hx   ? Liver cancer Neg Hx   ? ? ?Objective: ?Office vital signs reviewed. ?BP 139/76   Pulse 84   Temp (!) 97.5 ?F (36.4 ?C)   Ht '5\' 2"'$  (1.575 m)   Wt 144 lb 6.4 oz (65.5 kg)   SpO2 97%   BMI 26.41 kg/m?  ? ?Physical Examination:  ?General: Awake, alert, well nourished, No acute distress ?Neuro: Frequently looks to her husband for answers. ?Psych: Patient is extremely pleasant, interactive.  She responds to questions when able ? ?Assessment/ Plan: ?76 y.o. female  ? ?Moderate dementia without behavioral disturbance ([place]) ? ?Stage 3a chronic kidney disease ([place]) - Plan: Renal Function Panel ? ?Dementia seems to be progressing.  I had a frank conversation with her spouse today about the progressive nature of this disease and that she should, while she still has her wits about her, be talking about long-term planning, specifically if her disease was to progress to the point where she is not capable of self-care what their plan is.  Would she be placed in a facility, would they be able to private hire someone.  She is not currently on Medicaid so personal care services are not an option at this time and her husband seems to be having healthcare issues of his own.  I am going to try and get him plugged in with some support for her disease.  I highly recommended that she follow-up with her neurologist for recheck and further discussion.  I will CC her neurologist on this chart today.  I we will mail out some resources locally for her husband for support as well as give some personal care services  information that he might give appropriately hire if they have the financial means to do so. ? ?Orders Placed This Encounter  ?Procedures  ? Td : Tetanus/diphtheria >7yo Preservative  free  ? ?No orders of

## 2022-01-05 LAB — RENAL FUNCTION PANEL
Albumin: 4.3 g/dL (ref 3.7–4.7)
BUN/Creatinine Ratio: 14 (ref 12–28)
BUN: 17 mg/dL (ref 8–27)
CO2: 26 mmol/L (ref 20–29)
Calcium: 9.6 mg/dL (ref 8.7–10.3)
Chloride: 99 mmol/L (ref 96–106)
Creatinine, Ser: 1.18 mg/dL — ABNORMAL HIGH (ref 0.57–1.00)
Glucose: 104 mg/dL — ABNORMAL HIGH (ref 70–99)
Phosphorus: 3.8 mg/dL (ref 3.0–4.3)
Potassium: 4.7 mmol/L (ref 3.5–5.2)
Sodium: 138 mmol/L (ref 134–144)
eGFR: 48 mL/min/{1.73_m2} — ABNORMAL LOW (ref >59)

## 2022-01-25 ENCOUNTER — Ambulatory Visit (INDEPENDENT_AMBULATORY_CARE_PROVIDER_SITE_OTHER): Payer: Medicare HMO | Admitting: Family Medicine

## 2022-01-25 ENCOUNTER — Encounter: Payer: Self-pay | Admitting: Family Medicine

## 2022-01-25 DIAGNOSIS — U071 COVID-19: Secondary | ICD-10-CM

## 2022-01-25 MED ORDER — MOLNUPIRAVIR EUA 200MG CAPSULE: 4.0000 | ORAL_CAPSULE | Freq: Two times a day (BID) | ORAL | 0 refills | Status: AC

## 2022-01-25 NOTE — Progress Notes (Signed)
? ?Virtual Visit via telephone Note ? ?I connected with Kristina Gibbs on 01/25/22 at [phone] by telephone and verified that I am speaking with the correct person using two identifiers. Kristina Gibbs is currently located at home and  husband  are currently with her during visit. The provider, Fransisca Kaufmann Miosotis Wetsel, MD is located in their office at time of visit. ? ?Call ended at 1000 ? ?I discussed the limitations, risks, security and privacy concerns of performing an evaluation and management service by telephone and the availability of in person appointments. I also discussed with the patient that there may be a patient responsible charge related to this service. The patient expressed understanding and agreed to proceed. ? ? ?History and Present Illness: ?Patient is calling in for cough and congestion and body aches and it started 3 days ago.  She tested positive for covid and husband did as well.  She denies SOB or wheezing but has a cough that will not go away.  ? ?1. COVID-19 virus infection   ? ? ?Outpatient Encounter Medications as of 01/25/2022  ?Medication Sig  ? molnupiravir EUA (LAGEVRIO) 200 mg CAPS capsule Take 4 capsules (800 mg total) by mouth 2 (two) times daily for 5 days.  ? amLODipine (NORVASC) 2.5 MG tablet TAKE 1 TABLET BY MOUTH EVERY DAY  ? aspirin EC 81 MG tablet Take 81 mg by mouth at bedtime.   ? b complex vitamins tablet Take 1 tablet by mouth daily.   ? calcium carbonate (OS-CAL) 600 MG TABS tablet Take 1 tablet (600 mg total) by mouth daily.  ? cetirizine (ZYRTEC ALLERGY) 10 MG tablet Take 1 tablet (10 mg total) by mouth daily.  ? cholecalciferol (VITAMIN D) 1000 UNITS tablet Take 1,000 Units by mouth daily.  ? CO ENZYME Q-10 PO Take 1 capsule by mouth daily.   ? ELIQUIS 5 MG TABS tablet TAKE 1 TABLET BY MOUTH TWICE A DAY  ? Ferrous Gluconate (IRON 27 PO) Take by mouth. Pt isn't sure of the dosage of otc iron taken daily.  ? furosemide (LASIX) 20 MG tablet TAKE 1 TABLET BY MOUTH  EVERY DAY  ? memantine (NAMENDA) 10 MG tablet TAKE 1 TABLET BY MOUTH TWICE A DAY  ? metoprolol succinate (TOPROL-XL) 50 MG 24 hr tablet TAKE 1 TABLET (50 MG TOTAL) BY MOUTH IN THE MORNING AND AT BEDTIME.  ? nitroGLYCERIN (NITROLINGUAL) 0.4 MG/SPRAY spray PLACE 1 SPRAY UNDER THE TONGUE EVERY 5 (FIVE) MINUTES AS NEEDED FOR CHEST PAIN.  ? omega-3 acid ethyl esters (LOVAZA) 1 g capsule TAKE 1 CAPSULE (1 G TOTAL) BY MOUTH 4 (FOUR) TIMES DAILY.  ? pantoprazole (PROTONIX) 40 MG tablet TAKE 1 TABLET BY MOUTH TWICE A DAY  ? PATADAY 0.2 % SOLN Place 1 drop into both eyes 2 (two) times daily. (Patient not taking: Reported on 09/18/2021)  ? ranolazine (RANEXA) 500 MG 12 hr tablet TAKE 1 TABLET BY MOUTH TWICE A DAY  ? rosuvastatin (CRESTOR) 20 MG tablet Take 1 tablet (20 mg total) by mouth daily.  ? sodium chloride (OCEAN) 0.65 % SOLN nasal spray Place 1 spray into both nostrils as needed for congestion.  ? vitamin C (ASCORBIC ACID) 500 MG tablet Take 1,000 mg by mouth daily.   ? ?No facility-administered encounter medications on file as of 01/25/2022.  ? ? ?Review of Systems  ?Constitutional:  Positive for chills. Negative for fever.  ?HENT:  Positive for congestion, postnasal drip, rhinorrhea, sinus pressure, sneezing and sore throat. Negative  for ear discharge and ear pain.   ?Eyes:  Negative for visual disturbance.  ?Respiratory:  Positive for cough. Negative for chest tightness, shortness of breath and wheezing.   ?Cardiovascular:  Negative for chest pain and leg swelling.  ?Musculoskeletal:  Positive for myalgias. Negative for back pain and gait problem.  ?Skin:  Negative for rash.  ?Neurological:  Negative for light-headedness and headaches.  ?Psychiatric/Behavioral:  Negative for agitation and behavioral problems.   ?All other systems reviewed and are negative. ? ?Observations/Objective: ?Patient sounds comfortable and in no acute distress ? ?Assessment and Plan: ?Problem List Items Addressed This Visit   ?None ?Visit  Diagnoses   ? ? COVID-19 virus infection    -  Primary  ? Relevant Medications  ? molnupiravir EUA (LAGEVRIO) 200 mg CAPS capsule  ? ?  ?  ?Recommended mucinex and hydration and call back  ?Follow up plan: ?No follow-ups on file. ? ? ?  ?I discussed the assessment and treatment plan with the patient. The patient was provided an opportunity to ask questions and all were answered. The patient agreed with the plan and demonstrated an understanding of the instructions. ?  ?The patient was advised to call back or seek an in-person evaluation if the symptoms worsen or if the condition fails to improve as anticipated. ? ?The above assessment and management plan was discussed with the patient. The patient verbalized understanding of and has agreed to the management plan. Patient is aware to call the clinic if symptoms persist or worsen. Patient is aware when to return to the clinic for a follow-up visit. Patient educated on when it is appropriate to go to the emergency department.  ? ? ?I provided 9 minutes of non-face-to-face time during this encounter. ? ? ? ?Worthy Rancher, MD ?  ? ?

## 2022-01-29 ENCOUNTER — Telehealth: Payer: Self-pay | Admitting: Cardiovascular Disease

## 2022-01-29 NOTE — Telephone Encounter (Signed)
Prescription refill request for Eliquis received. ?Indication:Afib ?Last office visit:12/22 ?Scr:1.1 ?Age: 76 ?Weight:65.5 kg ? ?Prescription refilled ? ?

## 2022-02-01 ENCOUNTER — Ambulatory Visit (INDEPENDENT_AMBULATORY_CARE_PROVIDER_SITE_OTHER): Payer: Medicare HMO | Admitting: Family Medicine

## 2022-02-01 ENCOUNTER — Encounter: Payer: Self-pay | Admitting: Family Medicine

## 2022-02-01 VITALS — BP 136/69 | HR 85 | Temp 97.1°F | Ht 62.0 in | Wt 144.2 lb

## 2022-02-01 DIAGNOSIS — N1831 Chronic kidney disease, stage 3a: Secondary | ICD-10-CM

## 2022-02-01 DIAGNOSIS — Z8616 Personal history of COVID-19: Secondary | ICD-10-CM

## 2022-02-01 DIAGNOSIS — R531 Weakness: Secondary | ICD-10-CM

## 2022-02-01 MED ORDER — BENZONATATE 100 MG PO CAPS: 100.0000 mg | ORAL_CAPSULE | Freq: Three times a day (TID) | ORAL | 0 refills | Status: DC | PRN

## 2022-02-01 NOTE — Progress Notes (Signed)
? ?Subjective: ?CC: COVID-19 infection ?PCP: [name], DO ?OEU:Kristina Gibbs is a 76 y.o. female who is accompanied today by her husband.  She is presenting to clinic today for: ? ?1.  COVID-19 infection ?Patient was diagnosed with COVID-19 infection on 01/22/2022 and started on antivirals on 01/25/2022.  She notes that she has had generalized weakness and persistent cough despite treatment.  She is currently utilizing Mucinex with some good sputum production that is clear to yellow.  No hemoptysis or brown sputum.  No reported fevers.  She sometimes feels like it is hard to take a deep breath in but overall no shortness of breath or wheezing reported.  Her husband was sick with similar and actually hospitalized for this. ? ? ?ROS: Per HPI ? ?Allergies  ?Allergen Reactions  ? Codeine Nausea And Vomiting  ? Hydrocortisone Itching  ? Lactose Intolerance (Gi) Nausea And Vomiting and Nausea Only  ? ?Past Medical History:  ?Diagnosis Date  ? Allergy   ? Arthritis   ? 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 Coronary artery disease   ? Heart murmur   ? Hypercholesteremia   ? Hypertension   ? Memory impairment   ?  MMSE 27/30  ? Osteopenia   ? Stomach problems   ? 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 Right Shin  ? Superficial basal cell carcinoma (BCC) 03/30/1999  ? Left Thigh (treatment)  ? Unstable angina ([place]) 05/27/2014  ? ? ?Current Outpatient Medications:  ?  amLODipine (NORVASC) 2.5 MG tablet, TAKE 1 TABLET BY MOUTH EVERY DAY, Disp: 90 tablet, Rfl: 3 ?  aspirin EC 81 MG tablet, Take 81 mg by mouth at bedtime. , Disp: , Rfl:  ?  b complex vitamins tablet, Take 1 tablet by mouth daily. , Disp: , Rfl:  ?  calcium carbonate (OS-CAL) 600 MG TABS tablet, Take 1 tablet (600 mg total) by mouth daily., Disp: 30 tablet, Rfl: 3 ?  cetirizine (ZYRTEC ALLERGY) 10 MG tablet, Take 1 tablet (10 mg total) by mouth daily., Disp: 90 tablet, Rfl: 3 ?  cholecalciferol (VITAMIN D) 1000 UNITS tablet, Take 1,000 Units by  mouth daily., Disp: , Rfl:  ?  CO ENZYME Q-10 PO, Take 1 capsule by mouth daily. , Disp: , Rfl:  ?  ELIQUIS 5 MG TABS tablet, TAKE 1 TABLET BY MOUTH TWICE A DAY, Disp: 60 tablet, Rfl: 5 ?  Ferrous Gluconate (IRON 27 PO), Take by mouth. Pt isn't sure of the dosage of otc iron taken daily., Disp: , Rfl:  ?  furosemide (LASIX) 20 MG tablet, TAKE 1 TABLET BY MOUTH EVERY DAY, Disp: 90 tablet, Rfl: 0 ?  memantine (NAMENDA) 10 MG tablet, TAKE 1 TABLET BY MOUTH TWICE A DAY, Disp: 180 tablet, Rfl: 1 ?  metoprolol succinate (TOPROL-XL) 50 MG 24 hr tablet, TAKE 1 TABLET (50 MG TOTAL) BY MOUTH IN THE MORNING AND AT BEDTIME., Disp: 180 tablet, Rfl: 2 ?  nitroGLYCERIN (NITROLINGUAL) 0.4 MG/SPRAY spray, PLACE 1 SPRAY UNDER THE TONGUE EVERY 5 (FIVE) MINUTES AS NEEDED FOR CHEST PAIN., Disp: 12 g, Rfl: 1 ?  omega-3 acid ethyl esters (LOVAZA) 1 g capsule, TAKE 1 CAPSULE (1 G TOTAL) BY MOUTH 4 (FOUR) TIMES DAILY., Disp: 360 capsule, Rfl: 2 ?  pantoprazole (PROTONIX) 40 MG tablet, TAKE 1 TABLET BY MOUTH TWICE A DAY, Disp: 180 tablet, Rfl: 2 ?  PATADAY 0.2 % SOLN, Place 1 drop into both eyes 2 (two) times daily., Disp: , Rfl:  6 ?  ranolazine (RANEXA) 500 MG 12 hr tablet, TAKE 1 TABLET BY MOUTH TWICE A DAY, Disp: 180 tablet, Rfl: 2 ?  rosuvastatin (CRESTOR) 20 MG tablet, TAKE 1 TABLET BY MOUTH EVERY DAY, Disp: 90 tablet, Rfl: 1 ?  sodium chloride (OCEAN) 0.65 % SOLN nasal spray, Place 1 spray into both nostrils as needed for congestion., Disp: 60 mL, Rfl: 4 ?  vitamin C (ASCORBIC ACID) 500 MG tablet, Take 1,000 mg by mouth daily. , Disp: , Rfl:  ?Social History  ? ?Socioeconomic History  ? Marital status: Married  ?  Spouse name: [name]   ? Number of children: 3  ? Years of education: 70  ? Highest education level: High school graduate  ?Occupational History  ? Occupation: Retired  ?Tobacco Use  ? Smoking status: Former  ?  Packs/day: 0.50  ?  Years: 49.00  ?  Pack years: 24.50  ?  Types: Cigarettes  ?  Quit date: 10/04/2008  ?  Years since  quitting: 13.3  ? Smokeless tobacco: Never  ?Vaping Use  ? Vaping Use: Never used  ?Substance and Sexual Activity  ? Alcohol use: Yes  ?  Alcohol/week: 1.0 standard drink  ?  Types: 1 Glasses of wine per week  ?  Comment: red wine once per month  ? Drug use: No  ? Sexual activity: Not Currently  ?  Birth control/protection: Surgical  ?Other Topics Concern  ? Not on file  ?Social History Narrative  ? Lives w/ husband  ? Caffeine use: 3 cups coffee/day  ? ?Social Determinants of Health  ? ?Financial Resource Strain: Low Risk   ? Difficulty of Paying Living Expenses: Not hard at all  ?Food Insecurity: No Food Insecurity  ? Worried About Charity fundraiser in the Last Year: Never true  ? Ran Out of Food in the Last Year: Never true  ?Transportation Needs: No Transportation Needs  ? Lack of Transportation (Medical): No  ? Lack of Transportation (Non-Medical): No  ?Physical Activity: Sufficiently Active  ? Days of Exercise per Week: 7 days  ? Minutes of Exercise per Session: 30 min  ?Stress: No Stress Concern Present  ? Feeling of Stress : Not at all  ?Social Connections: Socially Integrated  ? Frequency of Communication with Friends and Family: More than three times a week  ? Frequency of Social Gatherings with Friends and Family: More than three times a week  ? Attends Religious Services: More than 4 times per year  ? Active Member of Clubs or Organizations: Yes  ? Attends Archivist Meetings: More than 4 times per year  ? Marital Status: Married  ?Intimate Partner Violence: Not At Risk  ? Fear of Current or Ex-Partner: No  ? Emotionally Abused: No  ? Physically Abused: No  ? Sexually Abused: No  ? ?Family History  ?Problem Relation Age of Onset  ? Stroke Mother   ? Diabetes Mother   ? Kidney disease Mother   ? Hypertension Mother   ? Hyperlipidemia Mother   ? Dementia Mother   ? Heart disease Daughter   ?     heart attack x2  ? Heart disease Brother   ?     2 heart attacks  ? Breast cancer Maternal Aunt   ?  Colon cancer Neg Hx   ? Stomach cancer Neg Hx   ? Esophageal cancer Neg Hx   ? Rectal cancer Neg Hx   ? Liver cancer Neg Hx   ? ? ?  Objective: ?Office vital signs reviewed. ?BP 136/69   Pulse 85   Temp (!) 97.1 ?F (36.2 ?C)   Ht '5\' 2"'$  (1.575 m)   Wt 144 lb 3.2 oz (65.4 kg)   SpO2 96%   BMI 26.37 kg/m?  ? ?Physical Examination:  ?General: Awake, alert, nontoxic female, No acute distress ?HEENT: Normal ?   Neck: No masses palpated. No lymphadenopathy ?   Ears: Tympanic membranes intact moderately obscured by cerumen but apparent normal light reflex, no erythema, no bulging ?   Eyes: PERRLA, extraocular membranes intact, sclera white ?   Nose: nasal turbinates moist, clear nasal discharge ?   Throat: moist mucus membranes, mild oropharyngeal erythema, no tonsillar exudate.  Airway is patent ?Cardio: regular rate and rhythm, S1S2 heard, no murmurs appreciated ?Pulm: clear to auscultation bilaterally, no wheezes, rhonchi or rales; normal work of breathing on room air ? ?Assessment/ Plan: ?76 y.o. female  ? ?Personal history of COVID-19 - Plan: Basic Metabolic Panel, CBC with Differential, Magnesium ? ?General weakness - Plan: Basic Metabolic Panel, CBC with Differential, Magnesium ? ?Stage 3a chronic kidney disease ([place]) - Plan: Basic Metabolic Panel, CBC with Differential, Magnesium ? ?I am going to check electrolytes.  Sounds like she is hydrating without difficulty but given history of renal disease I would rather err on the side of caution.  We discussed that unfortunately the generalized fatigue and weakness can last several months post COVID infection.  I encouraged her to intake adequate nutrition, hydration. ? ?Cough Perles were sent to the pharmacy for as needed use.  Handout given for OTC remedies and conservative care.  No evidence of secondary bacterial infection on exam.  Lung exam unremarkable ? ?Orders Placed This Encounter  ?Procedures  ? Basic Metabolic Panel  ? CBC with Differential  ? Magnesium   ? ?Meds ordered this encounter  ?Medications  ? benzonatate (TESSALON PERLES) 100 MG capsule  ?  Sig: Take 1 capsule (100 mg total) by mouth 3 (three) times daily as needed for cough.  ?  Dispense:  20 capsu

## 2022-02-01 NOTE — Patient Instructions (Signed)

## 2022-02-02 LAB — BASIC METABOLIC PANEL
BUN/Creatinine Ratio: 13 (ref 12–28)
BUN: 14 mg/dL (ref 8–27)
CO2: 24 mmol/L (ref 20–29)
Calcium: 9 mg/dL (ref 8.7–10.3)
Chloride: 103 mmol/L (ref 96–106)
Creatinine, Ser: 1.04 mg/dL — ABNORMAL HIGH (ref 0.57–1.00)
Glucose: 89 mg/dL (ref 70–99)
Potassium: 4.8 mmol/L (ref 3.5–5.2)
Sodium: 140 mmol/L (ref 134–144)
eGFR: 55 mL/min/{1.73_m2} — ABNORMAL LOW (ref >59)

## 2022-02-02 LAB — CBC WITH DIFFERENTIAL/PLATELET
Basophils Absolute: 0 10*3/uL (ref 0.0–0.2)
Basos: 0 %
EOS (ABSOLUTE): 0 10*3/uL (ref 0.0–0.4)
Eos: 1 %
Hematocrit: 40.2 % (ref 34.0–46.6)
Hemoglobin: 13.4 g/dL (ref 11.1–15.9)
Immature Grans (Abs): 0.1 10*3/uL (ref 0.0–0.1)
Immature Granulocytes: 1 %
Lymphocytes Absolute: 1.1 10*3/uL (ref 0.7–3.1)
Lymphs: 20 %
MCH: 29.2 pg (ref 26.6–33.0)
MCHC: 33.3 g/dL (ref 31.5–35.7)
MCV: 88 fL (ref 79–97)
Monocytes Absolute: 0.5 10*3/uL (ref 0.1–0.9)
Monocytes: 10 %
Neutrophils Absolute: 3.7 10*3/uL (ref 1.4–7.0)
Neutrophils: 68 %
Platelets: 171 10*3/uL (ref 150–450)
RBC: 4.59 x10E6/uL (ref 3.77–5.28)
RDW: 12.1 % (ref 11.7–15.4)
WBC: 5.4 10*3/uL (ref 3.4–10.8)

## 2022-02-02 LAB — MAGNESIUM: Magnesium: 2.1 mg/dL (ref 1.6–2.3)

## 2022-03-12 ENCOUNTER — Ambulatory Visit (INDEPENDENT_AMBULATORY_CARE_PROVIDER_SITE_OTHER): Payer: Medicare HMO

## 2022-03-12 VITALS — Wt 144.0 lb

## 2022-03-12 DIAGNOSIS — F03B0 Unspecified dementia, moderate, without behavioral disturbance, psychotic disturbance, mood disturbance, and anxiety: Secondary | ICD-10-CM

## 2022-03-12 DIAGNOSIS — Z0000 Encounter for general adult medical examination without abnormal findings: Secondary | ICD-10-CM

## 2022-03-12 DIAGNOSIS — Z0001 Encounter for general adult medical examination with abnormal findings: Secondary | ICD-10-CM

## 2022-03-12 NOTE — Patient Instructions (Signed)
Kristina Gibbs , Thank you for taking time to come for your Medicare Wellness Visit. I appreciate your ongoing commitment to your health goals. Please review the following plan we discussed and let me know if I can assist you in the future.   Screening recommendations/referrals: Colonoscopy: Cologuard done 05/10/2020 - no repeat required Mammogram: Done 02/25/2021 - no repeat required Bone Density: Done 12/16/2020 - Repeat every 2 years if desired Recommended yearly ophthalmology/optometry visit for glaucoma screening and checkup Recommended yearly dental visit for hygiene and checkup  Vaccinations: Influenza vaccine: Done 2022 - Repeat annually in fall Pneumococcal vaccine: Done  03/23/2011 & 02/24/2015   Tdap vaccine: Done 01/04/2022 - Repeat in 10 years  Shingles vaccine: Zostavax done 2011 - Due for Shingrix which is 2 doses 2-6 months apart and over 90% effective     Covid-19: Done 11/29/2019, 12/24/2019, 05/22/2020, 01/13/2021, & 07/02/2021  Advanced directives: Please bring a copy of your health care power of attorney and living will to the office to be added to your chart at your convenience.  Conditions/risks identified: Continue walking daily 20-30 minutes, and aim for 6-8 glasses of water, and 5 servings of fruits and vegetables each day.   Next appointment: Follow up in one year for your annual wellness visit    Preventive Care 65 Years and Older, Female Preventive care refers to lifestyle choices and visits with your health care provider that can promote health and wellness. What does preventive care include? A yearly physical exam. This is also called an annual well check. Dental exams once or twice a year. Routine eye exams. Ask your health care provider how often you should have your eyes checked. Personal lifestyle choices, including: Daily care of your teeth and gums. Regular physical activity. Eating a healthy diet. Avoiding tobacco and drug use. Limiting alcohol  use. Practicing safe sex. Taking low-dose aspirin every day. Taking vitamin and mineral supplements as recommended by your health care provider. What happens during an annual well check? The services and screenings done by your health care provider during your annual well check will depend on your age, overall health, lifestyle risk factors, and family history of disease. Counseling  Your health care provider may ask you questions about your: Alcohol use. Tobacco use. Drug use. Emotional well-being. Home and relationship well-being. Sexual activity. Eating habits. History of falls. Memory and ability to understand (cognition). Work and work Statistician. Reproductive health. Screening  You may have the following tests or measurements: Height, weight, and BMI. Blood pressure. Lipid and cholesterol levels. These may be checked every 5 years, or more frequently if you are over 72 years old. Skin check. Lung cancer screening. You may have this screening every year starting at age 28 if you have a 30-pack-year history of smoking and currently smoke or have quit within the past 15 years. Fecal occult blood test (FOBT) of the stool. You may have this test every year starting at age 1. Flexible sigmoidoscopy or colonoscopy. You may have a sigmoidoscopy every 5 years or a colonoscopy every 10 years starting at age 46. Hepatitis C blood test. Hepatitis B blood test. Sexually transmitted disease (STD) testing. Diabetes screening. This is done by checking your blood sugar (glucose) after you have not eaten for a while (fasting). You may have this done every 1-3 years. Bone density scan. This is done to screen for osteoporosis. You may have this done starting at age 107. Mammogram. This may be done every 1-2 years. Talk to your health  care provider about how often you should have regular mammograms. Talk with your health care provider about your test results, treatment options, and if necessary,  the need for more tests. Vaccines  Your health care provider may recommend certain vaccines, such as: Influenza vaccine. This is recommended every year. Tetanus, diphtheria, and acellular pertussis (Tdap, Td) vaccine. You may need a Td booster every 10 years. Zoster vaccine. You may need this after age 25. Pneumococcal 13-valent conjugate (PCV13) vaccine. One dose is recommended after age 26. Pneumococcal polysaccharide (PPSV23) vaccine. One dose is recommended after age 35. Talk to your health care provider about which screenings and vaccines you need and how often you need them. This information is not intended to replace advice given to you by your health care provider. Make sure you discuss any questions you have with your health care provider. Document Released: 10/17/2015 Document Revised: 06/09/2016 Document Reviewed: 07/22/2015 Elsevier Interactive Patient Education  2017 [employer] Prevention in the Home Falls can cause injuries. They can happen to people of all ages. There are many things you can do to make your home safe and to help prevent falls. What can I do on the outside of my home? Regularly fix the edges of walkways and driveways and fix any cracks. Remove anything that might make you trip as you walk through a door, such as a raised step or threshold. Trim any bushes or trees on the path to your home. Use bright outdoor lighting. Clear any walking paths of anything that might make someone trip, such as rocks or tools. Regularly check to see if handrails are loose or broken. Make sure that both sides of any steps have handrails. Any raised decks and porches should have guardrails on the edges. Have any leaves, snow, or ice cleared regularly. Use sand or salt on walking paths during winter. Clean up any spills in your garage right away. This includes oil or grease spills. What can I do in the bathroom? Use night lights. Install grab bars by the toilet and in the  tub and shower. Do not use towel bars as grab bars. Use non-skid mats or decals in the tub or shower. If you need to sit down in the shower, use a plastic, non-slip stool. Keep the floor dry. Clean up any water that spills on the floor as soon as it happens. Remove soap buildup in the tub or shower regularly. Attach bath mats securely with double-sided non-slip rug tape. Do not have throw rugs and other things on the floor that can make you trip. What can I do in the bedroom? Use night lights. Make sure that you have a light by your bed that is easy to reach. Do not use any sheets or blankets that are too big for your bed. They should not hang down onto the floor. Have a firm chair that has side arms. You can use this for support while you get dressed. Do not have throw rugs and other things on the floor that can make you trip. What can I do in the kitchen? Clean up any spills right away. Avoid walking on wet floors. Keep items that you use a lot in easy-to-reach places. If you need to reach something above you, use a strong step stool that has a grab bar. Keep electrical cords out of the way. Do not use floor polish or wax that makes floors slippery. If you must use wax, use non-skid floor wax. Do not have throw  rugs and other things on the floor that can make you trip. What can I do with my stairs? Do not leave any items on the stairs. Make sure that there are handrails on both sides of the stairs and use them. Fix handrails that are broken or loose. Make sure that handrails are as long as the stairways. Check any carpeting to make sure that it is firmly attached to the stairs. Fix any carpet that is loose or worn. Avoid having throw rugs at the top or bottom of the stairs. If you do have throw rugs, attach them to the floor with carpet tape. Make sure that you have a light switch at the top of the stairs and the bottom of the stairs. If you do not have them, ask someone to add them for  you. What else can I do to help prevent falls? Wear shoes that: Do not have high heels. Have rubber bottoms. Are comfortable and fit you well. Are closed at the toe. Do not wear sandals. If you use a stepladder: Make sure that it is fully opened. Do not climb a closed stepladder. Make sure that both sides of the stepladder are locked into place. Ask someone to hold it for you, if possible. Clearly mark and make sure that you can see: Any grab bars or handrails. First and last steps. Where the edge of each step is. Use tools that help you move around (mobility aids) if they are needed. These include: Canes. Walkers. Scooters. Crutches. Turn on the lights when you go into a dark area. Replace any light bulbs as soon as they burn out. Set up your furniture so you have a clear path. Avoid moving your furniture around. If any of your floors are uneven, fix them. If there are any pets around you, be aware of where they are. Review your medicines with your doctor. Some medicines can make you feel dizzy. This can increase your chance of falling. Ask your doctor what other things that you can do to help prevent falls. This information is not intended to replace advice given to you by your health care provider. Make sure you discuss any questions you have with your health care provider. Document Released: 07/17/2009 Document Revised: 02/26/2016 Document Reviewed: 10/25/2014 Elsevier Interactive Patient Education  2017 [employer].

## 2022-03-12 NOTE — Progress Notes (Signed)
Subjective:   Kristina Gibbs is a 76 y.o. female who presents for Medicare Annual (Subsequent) preventive examination.  Virtual Visit via Telephone Note  I connected with  [name] on 03/12/22 at 10:30 AM EDT by telephone and verified that I am speaking with the correct person using two identifiers.  Location: Patient: Home Provider: [facility] Persons participating in the virtual visit: patient, husband Kristina Gibbs, and Nurse Health Advisor   I discussed the limitations, risks, security and privacy concerns of performing an evaluation and management service by telephone and the availability of in person appointments. The patient expressed understanding and agreed to proceed.  Interactive audio and video telecommunications were attempted between this nurse and patient, however failed, due to patient having technical difficulties OR patient did not have access to video capability.  We continued and completed visit with audio only.  Some vital signs may be absent or patient reported.   Aviya Jarvie E Jenella Craigie, LPN   Review of Systems     Cardiac Risk Factors include: advanced age (>51mn, >>84women);dyslipidemia;hypertension;sedentary lifestyle;Other (see comment), Risk factor comments: CAD, A.Fib, hx of CABG     Objective:    Today's Vitals   03/12/22 1031  Weight: 144 lb (65.3 kg)   Body mass index is 26.34 kg/m.     03/12/2022   10:57 AM 03/11/2021    9:29 AM 12/04/2020   11:29 PM 12/04/2020    7:00 PM 12/04/2020    3:54 PM 11/21/2019    9:11 AM 09/18/2019    1:58 PM  Advanced Directives  Does Patient Have a Medical Advance Directive? Yes Yes No No No Yes Yes  Type of [place] will    [place] will   Does patient want to make changes to medical advance directive?      No - Patient declined   Copy of H[place] Chart? No - copy requested No - copy  requested    No - copy requested   Would patient like information on creating a medical advance directive?   No - Patient declined No - Patient declined       Current Medications (verified) Outpatient Encounter Medications as of 03/12/2022  Medication Sig   amLODipine (NORVASC) 2.5 MG tablet TAKE 1 TABLET BY MOUTH EVERY DAY   aspirin EC 81 MG tablet Take 81 mg by mouth at bedtime.    b complex vitamins tablet Take 1 tablet by mouth daily.    benzonatate (TESSALON PERLES) 100 MG capsule Take 1 capsule (100 mg total) by mouth 3 (three) times daily as needed for cough.   calcium carbonate (OS-CAL) 600 MG TABS tablet Take 1 tablet (600 mg total) by mouth daily.   cetirizine (ZYRTEC ALLERGY) 10 MG tablet Take 1 tablet (10 mg total) by mouth daily.   cholecalciferol (VITAMIN D) 1000 UNITS tablet Take 1,000 Units by mouth daily.   CO ENZYME Q-10 PO Take 1 capsule by mouth daily.    ELIQUIS 5 MG TABS tablet TAKE 1 TABLET BY MOUTH TWICE A DAY   Ferrous Gluconate (IRON 27 PO) Take by mouth. Pt isn't sure of the dosage of otc iron taken daily.   furosemide (LASIX) 20 MG tablet TAKE 1 TABLET BY MOUTH EVERY DAY   memantine (NAMENDA) 10 MG tablet TAKE 1 TABLET BY MOUTH TWICE A DAY   metoprolol succinate (TOPROL-XL) 50 MG 24 hr tablet TAKE 1 TABLET (50  MG TOTAL) BY MOUTH IN THE MORNING AND AT BEDTIME.   nitroGLYCERIN (NITROLINGUAL) 0.4 MG/SPRAY spray PLACE 1 SPRAY UNDER THE TONGUE EVERY 5 (FIVE) MINUTES AS NEEDED FOR CHEST PAIN.   omega-3 acid ethyl esters (LOVAZA) 1 g capsule TAKE 1 CAPSULE (1 G TOTAL) BY MOUTH 4 (FOUR) TIMES DAILY.   pantoprazole (PROTONIX) 40 MG tablet TAKE 1 TABLET BY MOUTH TWICE A DAY   PATADAY 0.2 % SOLN Place 1 drop into both eyes 2 (two) times daily.   ranolazine (RANEXA) 500 MG 12 hr tablet TAKE 1 TABLET BY MOUTH TWICE A DAY   rosuvastatin (CRESTOR) 20 MG tablet TAKE 1 TABLET BY MOUTH EVERY DAY   sodium chloride (OCEAN) 0.65 % SOLN nasal spray Place 1 spray into both nostrils as  needed for congestion.   vitamin C (ASCORBIC ACID) 500 MG tablet Take 1,000 mg by mouth daily.    No facility-administered encounter medications on file as of 03/12/2022.    Allergies (verified) Codeine, Hydrocortisone, and Lactose intolerance (gi)   History: Past Medical History:  Diagnosis Date   Allergy    Arthritis    Cancer ([place] 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   Right Shin   Superficial basal cell carcinoma (BCC) 03/30/1999   Left Thigh (treatment)   Unstable angina ([place]) 05/27/2014   Past Surgical History:  Procedure Laterality Date   ABDOMINAL HYSTERECTOMY     CARDIOVERSION N/A 01/25/2018   Procedure: CARDIOVERSION;  Surgeon: Acie Fredrickson Wonda Cheng, MD;  Location: [facility];  Service: Cardiovascular;  Laterality: N/A;   CHOLECYSTECTOMY     CORONARY ARTERY BYPASS GRAFT     EYE SURGERY     bilateral cataract extraction   HAND SURGERY Left    injurred hand at work- nerve repair   hysterectomy     LEFT HEART CATHETERIZATION WITH CORONARY ANGIOGRAM N/A 05/27/2014   Procedure: LEFT HEART CATHETERIZATION WITH CORONARY ANGIOGRAM;  Surgeon: [name], MD;  Location: [facility] CATH LAB;  Service: Cardiovascular;  Laterality: N/A;   PCTA     x7    Family History  Problem Relation Age of Onset   Stroke Mother    Diabetes Mother    Kidney disease Mother    Hypertension Mother    Hyperlipidemia Mother    Dementia Mother    Heart disease Daughter        heart attack x2   Heart disease Brother        2 heart attacks   Breast cancer Maternal Aunt    Colon cancer Neg Hx    Stomach cancer Neg Hx    Esophageal cancer Neg Hx    Rectal cancer Neg Hx    Liver cancer Neg Hx    Social History   Socioeconomic History   Marital status: Married    Spouse name: Kristina Gibbs    Number of children: 3    Years of education: 12   Highest education level: High school graduate  Occupational History   Occupation: Retired  Tobacco Use   Smoking status: Former    Packs/day: 0.50    Years: 49.00    Total pack years: 24.50    Types: Cigarettes    Quit date: 10/04/2008    Years since quitting:  13.4   Smokeless tobacco: Never  Vaping Use   Vaping Use: Never used  Substance and Sexual Activity   Alcohol use: Yes    Alcohol/week: 1.0 standard drink of alcohol    Types: 1 Glasses of wine per week    Comment: red wine once per month   Drug use: No   Sexual activity: Not Currently    Birth control/protection: Surgical  Other Topics Concern   Not on file  Social History Narrative   L[place] w/ husband in [place]   Caffeine use: 3 cups coffee/day   Social Determinants of Health   Financial Resource Strain: Low Risk  (03/12/2022)   Overall Financial Resource Strain (CARDIA)    Difficulty of Paying Living Expenses: Not hard at all  Food Insecurity: No Food Insecurity (03/12/2022)   Hunger Vital Sign    Worried About Running Out of Food in the Last Year: Never true    Ran Out of Food in the Last Year: Never true  Transportation Needs: No Transportation Needs (03/12/2022)   PRAPARE - Hydrologist (Medical): No    Lack of Transportation (Non-Medical): No  Physical Activity: Sufficiently Active (03/12/2022)   Exercise Vital Sign    Days of Exercise per Week: 7 days    Minutes of Exercise per Session: 30 min  Stress: No Stress Concern Present (03/12/2022)   [place]    Feeling of Stress : Not at all  Social Connections: [place] (03/12/2022)   Social Connection and Isolation Panel [NHANES]    Frequency of Communication with Friends and Family: More than three times a week    Frequency of Social Gatherings with Friends and Family: More than three times a week    Attends Religious  Services: More than 4 times per year    Active Member of [employer] or Organizations: Yes    Attends Music therapist: More than 4 times per year    Marital Status: Married    Tobacco Counseling Counseling given: Not Answered   Clinical Intake:  Pre-visit preparation completed: Yes  Pain : No/denies pain     BMI - recorded: 26.34 Nutritional Status: BMI 25 -29 Overweight Nutritional Risks: Nausea/ vomitting/ diarrhea (diarrhea) Diabetes: No  How often do you need to have someone help you when you read instructions, pamphlets, or other written materials from your doctor or pharmacy?: 3 - Sometimes  Diabetic? no  Interpreter Needed?: No  Information entered by :: Alwilda Gilland, LPN   Activities of Daily Living    03/12/2022   10:36 AM  In your present state of health, do you have any difficulty performing the following activities:  Hearing? 0  Vision? 0  Difficulty concentrating or making decisions? 1  Walking or climbing stairs? 0  Dressing or bathing? 0  Doing errands, shopping? 1  Preparing Food and eating ? N  Using the Toilet? N  In the past six months, have you accidently leaked urine? Y  Do you have problems with loss of bowel control? Y  Managing your Medications? Y  Managing your Finances? Y  Housekeeping or managing your Housekeeping? N    Patient Care Team: [name], DO as PCP - General (Family Medicine) [name], MD as PCP - Cardiology (Cardiology) Milus Banister, MD as Attending Physician (Gastroenterology) Celestia Khat, [place] (Optometry) Lavonna Monarch, MD as Consulting Physician (Dermatology) Okey Regal, OD (Optometry)  Indicate any  recent Medical Services you may have received from other than [facility] providers in the past year (date may be approximate).     Assessment:   This is a routine wellness examination for [facility].  Hearing/Vision screen Hearing Screening - Comments:: Denies hearing difficulties    Vision Screening - Comments:: Wears rx glasses - up to date with routine eye exams with [facility]  Dietary issues and exercise activities discussed: Current Exercise Habits: Home exercise routine, Type of exercise: walking, Time (Minutes): 30, Frequency (Times/Week): 7, Weekly Exercise (Minutes/Week): 210, Intensity: Mild, Exercise limited by: cardiac condition(s)   Goals Addressed             This Visit's Progress    DIET - INCREASE WATER INTAKE   On track    Try to drink 6-8 glasses of water daily     Exercise 3x per week (30 min per time)   On track    Continue to walk for 30 minutes daily       Depression Screen    03/12/2022   10:51 AM 02/01/2022   10:44 AM 01/04/2022    9:14 AM 09/04/2021    8:04 AM 05/11/2021    4:07 PM 03/11/2021    8:31 AM 03/05/2021   11:35 AM  PHQ 2/9 Scores  PHQ - 2 Score 1 0 1 0 2 0 0  PHQ- 9 Score '6 11 12 '$ 0 3      Fall Risk    03/12/2022   10:34 AM 02/01/2022   10:44 AM 01/04/2022    9:14 AM 09/04/2021    8:04 AM 05/11/2021    4:07 PM  Fall Risk   Falls in the past year? 0 0 0 0 0  Number falls in past yr: 0      Injury with Fall? 0      Risk for fall due to : No Fall Risks      Follow up Falls prevention discussed        [place]:  Any stairs in or around the home? No  If so, are there any without handrails? No  Home free of loose throw rugs in walkways, pet beds, electrical cords, etc? Yes  Adequate lighting in your home to reduce risk of falls? Yes   ASSISTIVE DEVICES UTILIZED TO PREVENT FALLS:  Life alert? No  Use of a cane, walker or w/c? No  Grab bars in the bathroom? No  Shower chair or bench in shower? No  Elevated toilet seat or a handicapped toilet? No   TIMED UP AND GO:  Was the test performed? No . Telephonic visit  Cognitive Function:    12/16/2020   10:49 AM 09/02/2020    9:27 AM 11/12/2019   11:43 AM 09/12/2019    8:38 AM 05/08/2019   10:00 AM  MMSE - Mini Mental State Exam   Orientation to time '2 4 3 4 4  '$ Orientation to Place '2 5 3 4 3  '$ Registration '3 3 3 3 3  '$ Attention/ Calculation '3 3 1 3 1  '$ Recall 1 1 0 2 1  Language- name 2 objects '2 2 2 2 2  '$ Language- repeat 1 1 0 0 1  Language- follow 3 step command '3 3 3 3 2  '$ Language- follow 3 step command-comments     She held the paper in the air  Language- read & follow direction '1 1 1 1 1  '$ Write a sentence 1 1 1  1 1  Copy design '1 1 1 1 1  '$ Total score '20 25 18 24 20        '$ 03/12/2022   10:36 AM 11/21/2019    9:16 AM  6CIT Screen  What Year? 4 points 0 points  What month? 3 points 0 points  What time? 3 points 0 points  Count back from 20 4 points 2 points  Months in reverse 4 points 4 points  Repeat phrase 10 points 2 points  Total Score 28 points 8 points    Immunizations Immunization History  Administered Date(s) Administered   Fluad Quad(high Dose 65+) 06/06/2019, 07/11/2020   Influenza, High Dose Seasonal PF 07/12/2016, 07/12/2017, 08/09/2018   Influenza,inj,Quad PF,6+ Mos 07/30/2013, 07/02/2014, 07/07/2015   Moderna Sars-Covid-2 Vaccination 11/29/2019, 12/24/2019, 05/22/2020, 01/13/2021, 07/02/2021   Pneumococcal Conjugate-13 02/24/2015   Pneumococcal Polysaccharide-23 03/23/2011   Td 01/04/2022   Tdap 06/23/2010   Zoster, Live 05/08/2010    TDAP status: Up to date  Flu Vaccine status: Due, Education has been provided regarding the importance of this vaccine. Advised may receive this vaccine at local pharmacy or Health Dept. Aware to provide a copy of the vaccination record if obtained from local pharmacy or Health Dept. Verbalized acceptance and understanding.  Pneumococcal vaccine status: Up to date  Covid-19 vaccine status: Completed vaccines  Qualifies for Shingles Vaccine? Yes   Zostavax completed Yes   Shingrix Completed?: No.    Education has been provided regarding the importance of this vaccine. Patient has been advised to call insurance company to determine out of pocket  expense if they have not yet received this vaccine. Advised may also receive vaccine at local pharmacy or Health Dept. Verbalized acceptance and understanding.  Screening Tests Health Maintenance  Topic Date Due   COVID-19 Vaccine (6 - Booster for Moderna series) 08/27/2021   MAMMOGRAM  02/25/2022   Zoster Vaccines- Shingrix (1 of 2) 04/05/2022 (Originally 09/14/1965)   INFLUENZA VACCINE  05/04/2022   DEXA SCAN  12/17/2022   TETANUS/TDAP  01/05/2032   Pneumonia Vaccine 48+ Years old  Completed   Hepatitis C Screening  Completed   HPV VACCINES  Aged Out   Fecal DNA (Cologuard)  Discontinued    Health Maintenance  Health Maintenance Due  Topic Date Due   COVID-19 Vaccine (6 - Booster for Moderna series) 08/27/2021   MAMMOGRAM  02/25/2022    Colorectal cancer screening: No longer required.   Mammogram status: Completed 02/25/2021. Repeat every year  Bone Density status: Completed 12/16/2020. Results reflect: Bone density results: OSTEOPOROSIS. Repeat every 2 years.  Lung Cancer Screening: (Low Dose CT Chest recommended if Age 67-80 years, 30 pack-year currently smoking OR have quit w/in 15years.) does not qualify.   Additional Screening:  Hepatitis C Screening: does qualify; Completed 11/06/2015  Vision Screening: Recommended annual ophthalmology exams for early detection of glaucoma and other disorders of the eye. Is the patient up to date with their annual eye exam?  Yes  Who is the provider or what is the name of the office in which the patient attends annual eye exams? [facility] If pt is not established with a provider, would they like to be referred to a provider to establish care? No .   Dental Screening: Recommended annual dental exams for proper oral hygiene  Community Resource Referral / Chronic Care Management: CRR required this visit?  Yes   CCM required this visit?  No      Plan:     I have  personally reviewed and noted the following in the patient's  chart:   Medical and social history Use of alcohol, tobacco or illicit drugs  Current medications and supplements including opioid prescriptions.  Functional ability and status Nutritional status Physical activity Advanced directives List of other physicians Hospitalizations, surgeries, and ER visits in previous 12 months Vitals Screenings to include cognitive, depression, and falls Referrals and appointments  In addition, I have reviewed and discussed with patient certain preventive protocols, quality metrics, and best practice recommendations. A written personalized care plan for preventive services as well as general preventive health recommendations were provided to patient.     Sandrea Hammond, LPN   10/10/3565   Nurse Notes: CRR to help pt get life alert device

## 2022-03-18 ENCOUNTER — Telehealth: Payer: Self-pay

## 2022-03-18 NOTE — Telephone Encounter (Signed)
   Telephone encounter was:  Successful.  03/18/2022 Name: Kristina Gibbs MRN: [id number] DOB: 24-Apr-1946  Kristina Gibbs is a 76 y.o. year old female who is a primary care patient of [name], DO . The community resource team was consulted for assistance with  life alert.  Care guide performed the following interventions: Spoke with patient about Med Alert. Patient requested that the information be mailed to her home address.  Follow Up Plan:  Care guide will follow up with patient by phone over the next 7 days.  Inocencio Roy, AAS Paralegal, [facility]  [address], [place] [address] ??[email or web address]  ?? [phone]   [email or web address]

## 2022-03-26 ENCOUNTER — Telehealth: Payer: Self-pay

## 2022-04-15 ENCOUNTER — Other Ambulatory Visit: Payer: Self-pay | Admitting: Family Medicine

## 2022-04-15 DIAGNOSIS — Z1231 Encounter for screening mammogram for malignant neoplasm of breast: Secondary | ICD-10-CM

## 2022-04-19 ENCOUNTER — Other Ambulatory Visit: Payer: Self-pay | Admitting: Family Medicine

## 2022-04-20 ENCOUNTER — Ambulatory Visit
Admission: RE | Admit: 2022-04-20 | Discharge: 2022-04-20 | Disposition: A | Discharge status: Home / Self Care | Payer: Medicare HMO | Source: Ambulatory Visit | Attending: Family Medicine | Admitting: Family Medicine

## 2022-04-20 DIAGNOSIS — Z1231 Encounter for screening mammogram for malignant neoplasm of breast: Secondary | ICD-10-CM

## 2022-04-24 ENCOUNTER — Other Ambulatory Visit: Payer: Self-pay | Admitting: Family Medicine

## 2022-05-01 DIAGNOSIS — L600 Ingrowing nail: Secondary | ICD-10-CM | POA: Diagnosis not present

## 2022-05-01 DIAGNOSIS — Z885 Allergy status to narcotic agent status: Secondary | ICD-10-CM | POA: Diagnosis not present

## 2022-05-01 DIAGNOSIS — Z888 Allergy status to other drugs, medicaments and biological substances status: Secondary | ICD-10-CM | POA: Diagnosis not present

## 2022-05-06 DIAGNOSIS — L84 Corns and callosities: Secondary | ICD-10-CM | POA: Diagnosis not present

## 2022-05-06 DIAGNOSIS — M79676 Pain in unspecified toe(s): Secondary | ICD-10-CM | POA: Diagnosis not present

## 2022-05-06 DIAGNOSIS — I70203 Unspecified atherosclerosis of native arteries of extremities, bilateral legs: Secondary | ICD-10-CM | POA: Diagnosis not present

## 2022-05-06 DIAGNOSIS — B351 Tinea unguium: Secondary | ICD-10-CM | POA: Diagnosis not present

## 2022-05-07 ENCOUNTER — Ambulatory Visit (INDEPENDENT_AMBULATORY_CARE_PROVIDER_SITE_OTHER): Payer: Medicare HMO | Admitting: Family Medicine

## 2022-05-07 ENCOUNTER — Encounter: Payer: Self-pay | Admitting: Family Medicine

## 2022-05-07 VITALS — BP 135/71 | HR 89 | Temp 97.2°F | Ht 62.0 in | Wt 144.2 lb

## 2022-05-07 DIAGNOSIS — Z0000 Encounter for general adult medical examination without abnormal findings: Secondary | ICD-10-CM

## 2022-05-07 DIAGNOSIS — I4819 Other persistent atrial fibrillation: Secondary | ICD-10-CM | POA: Diagnosis not present

## 2022-05-07 DIAGNOSIS — I2510 Atherosclerotic heart disease of native coronary artery without angina pectoris: Secondary | ICD-10-CM | POA: Diagnosis not present

## 2022-05-07 DIAGNOSIS — E785 Hyperlipidemia, unspecified: Secondary | ICD-10-CM

## 2022-05-07 DIAGNOSIS — F03B0 Unspecified dementia, moderate, without behavioral disturbance, psychotic disturbance, mood disturbance, and anxiety: Secondary | ICD-10-CM | POA: Diagnosis not present

## 2022-05-07 DIAGNOSIS — Z9861 Coronary angioplasty status: Secondary | ICD-10-CM

## 2022-05-07 DIAGNOSIS — G301 Alzheimer's disease with late onset: Secondary | ICD-10-CM | POA: Insufficient documentation

## 2022-05-07 DIAGNOSIS — N1831 Chronic kidney disease, stage 3a: Secondary | ICD-10-CM | POA: Diagnosis not present

## 2022-05-07 DIAGNOSIS — Z23 Encounter for immunization: Secondary | ICD-10-CM

## 2022-05-07 DIAGNOSIS — M8580 Other specified disorders of bone density and structure, unspecified site: Secondary | ICD-10-CM

## 2022-05-07 DIAGNOSIS — Z0001 Encounter for general adult medical examination with abnormal findings: Secondary | ICD-10-CM

## 2022-05-07 MED ORDER — OMEGA-3-ACID ETHYL ESTERS 1 G PO CAPS: 1.0000 | ORAL_CAPSULE | Freq: Four times a day (QID) | ORAL | 4 refills | Status: DC

## 2022-05-07 MED ORDER — MEMANTINE HCL 10 MG PO TABS: 10.0000 mg | ORAL_TABLET | Freq: Two times a day (BID) | ORAL | 4 refills | Status: DC

## 2022-05-07 NOTE — Progress Notes (Signed)
Kristina Gibbs is a 76 y.o. female who is brought to the office by her spouse.  She presents to office today for annual physical exam examination.    Concerns today include: 1.  Memory loss Patient with known Alzheimer's dementia.  She still has not yet seen Dr Jaynee Eagles for follow-up.  Her husband notes that he sees progressive loss of memory despite compliance with Namenda 10 mg twice daily.  She has not had any mood instability.  Marital status: married, Substance use: none Diet: Typical American, Exercise: No structured Last colonoscopy: N/A Last mammogram: Up-to-date Last pap smear: N/A Refills needed today: None Immunizations needed: Immunization History  Administered Date(s) Administered   Fluad Quad(high Dose 65+) 06/06/2019, 07/11/2020   Influenza, High Dose Seasonal PF 07/12/2016, 07/12/2017, 08/09/2018   Influenza,inj,Quad PF,6+ Mos 07/30/2013, 07/02/2014, 07/07/2015   Moderna Sars-Covid-2 Vaccination 11/29/2019, 12/24/2019, 05/22/2020, 01/13/2021, 07/02/2021   Pneumococcal Conjugate-13 02/24/2015   Pneumococcal Polysaccharide-23 03/23/2011   Td 01/04/2022   Tdap 06/23/2010   Zoster, Live 05/08/2010     Past Medical History:  Diagnosis Date   Allergy    Arthritis    Cancer ([place])    ovarian   Coronary artery disease    Heart murmur    Hypercholesteremia    Hypertension    Memory impairment     MMSE 27/30   Osteopenia    Stomach problems    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   Right Shin   Superficial basal cell carcinoma (BCC) 03/30/1999   Left Thigh (treatment)   Unstable angina ([place]) 05/27/2014   Social History   Socioeconomic History   Marital status: Married    Spouse name: [name]    Number of children: 3   Years of education: 12   Highest education level: High school graduate  Occupational History   Occupation: Retired  Tobacco Use   Smoking status: Former    Packs/day: 0.50    Years: 49.00    Total pack years: 24.50     Types: Cigarettes    Quit date: 10/04/2008    Years since quitting: 13.5   Smokeless tobacco: Never  Vaping Use   Vaping Use: Never used  Substance and Sexual Activity   Alcohol use: Yes    Alcohol/week: 1.0 standard drink of alcohol    Types: 1 Glasses of wine per week    Comment: red wine once per month   Drug use: No   Sexual activity: Not Currently    Birth control/protection: Surgical  Other Topics Concern   Not on file  Social History Narrative   L[place] w/ husband in [place]   Caffeine use: 3 cups coffee/day   Social Determinants of Health   Financial Resource Strain: Low Risk  (03/12/2022)   Overall Financial Resource Strain (CARDIA)    Difficulty of Paying Living Expenses: Not hard at all  Food Insecurity: No Food Insecurity (03/12/2022)   Hunger Vital Sign    Worried About Running Out of Food in the Last Year: Never true    Ran Out of Food in the Last Year: Never true  Transportation Needs: No Transportation Needs (03/12/2022)   PRAPARE - Hydrologist (Medical): No    Lack of Transportation (Non-Medical): No  Physical Activity: Sufficiently Active (03/12/2022)   Exercise Vital Sign    Days of Exercise per Week: 7 days    Minutes of Exercise per Session: 30 min  Stress: No Stress Concern Present (03/12/2022)  [place] Questionnaire    Feeling of Stress : Not at all  Social Connections: Socially Integrated (03/12/2022)   Social Connection and Isolation Panel [NHANES]    Frequency of Communication with Friends and Family: More than three times a week    Frequency of Social Gatherings with Friends and Family: More than three times a week    Attends Religious Services: More than 4 times per year    Active Member of [employer] or Organizations: Yes    Attends Archivist Meetings: More than 4 times per year    Marital Status: Married  Human resources officer Violence: Not At Risk  (03/12/2022)   Humiliation, Afraid, Rape, and Kick questionnaire    Fear of Current or Ex-Partner: No    Emotionally Abused: No    Physically Abused: No    Sexually Abused: No   Past Surgical History:  Procedure Laterality Date   ABDOMINAL HYSTERECTOMY     CARDIOVERSION N/A 01/25/2018   Procedure: CARDIOVERSION;  Surgeon: Nahser, Wonda Cheng, MD;  Location: [facility] ENDOSCOPY;  Service: Cardiovascular;  Laterality: N/A;   CHOLECYSTECTOMY     CORONARY ARTERY BYPASS GRAFT     EYE SURGERY     bilateral cataract extraction   HAND SURGERY Left    injurred hand at work- nerve repair   hysterectomy     LEFT HEART CATHETERIZATION WITH CORONARY ANGIOGRAM N/A 05/27/2014   Procedure: LEFT HEART CATHETERIZATION WITH CORONARY ANGIOGRAM;  Surgeon: [name], MD;  Location: [facility] CATH LAB;  Service: Cardiovascular;  Laterality: N/A;   PCTA     x7    Family History  Problem Relation Age of Onset   Stroke Mother    Diabetes Mother    Kidney disease Mother    Hypertension Mother    Hyperlipidemia Mother    Dementia Mother    Heart disease Daughter        heart attack x2   Heart disease Brother        2 heart attacks   Breast cancer Maternal Aunt    Colon cancer Neg Hx    Stomach cancer Neg Hx    Esophageal cancer Neg Hx    Rectal cancer Neg Hx    Liver cancer Neg Hx     Current Outpatient Medications:    amLODipine (NORVASC) 2.5 MG tablet, TAKE 1 TABLET BY MOUTH EVERY DAY, Disp: 90 tablet, Rfl: 3   aspirin EC 81 MG tablet, Take 81 mg by mouth at bedtime. , Disp: , Rfl:    b complex vitamins tablet, Take 1 tablet by mouth daily. , Disp: , Rfl:    benzonatate (TESSALON PERLES) 100 MG capsule, Take 1 capsule (100 mg total) by mouth 3 (three) times daily as needed for cough., Disp: 20 capsule, Rfl: 0   calcium carbonate (OS-CAL) 600 MG TABS tablet, Take 1 tablet (600 mg total) by mouth daily., Disp: 30 tablet, Rfl: 3   cetirizine (ZYRTEC ALLERGY) 10 MG tablet, Take 1 tablet (10 mg total) by mouth  daily., Disp: 90 tablet, Rfl: 3   cholecalciferol (VITAMIN D) 1000 UNITS tablet, Take 1,000 Units by mouth daily., Disp: , Rfl:    CO ENZYME Q-10 PO, Take 1 capsule by mouth daily. , Disp: , Rfl:    ELIQUIS 5 MG TABS tablet, TAKE 1 TABLET BY MOUTH TWICE A DAY, Disp: 60 tablet, Rfl: 5   Ferrous Gluconate (IRON 27 PO), Take by mouth. Pt isn't sure of the  dosage of otc iron taken daily., Disp: , Rfl:    furosemide (LASIX) 20 MG tablet, TAKE 1 TABLET BY MOUTH EVERY DAY, Disp: 90 tablet, Rfl: 0   memantine (NAMENDA) 10 MG tablet, TAKE 1 TABLET BY MOUTH TWICE A DAY, Disp: 180 tablet, Rfl: 0   metoprolol succinate (TOPROL-XL) 50 MG 24 hr tablet, TAKE 1 TABLET (50 MG TOTAL) BY MOUTH IN THE MORNING AND AT BEDTIME., Disp: 180 tablet, Rfl: 2   nitroGLYCERIN (NITROLINGUAL) 0.4 MG/SPRAY spray, PLACE 1 SPRAY UNDER THE TONGUE EVERY 5 (FIVE) MINUTES AS NEEDED FOR CHEST PAIN., Disp: 12 g, Rfl: 1   omega-3 acid ethyl esters (LOVAZA) 1 g capsule, TAKE 1 CAPSULE (1 G TOTAL) BY MOUTH 4 (FOUR) TIMES DAILY., Disp: 360 capsule, Rfl: 0   pantoprazole (PROTONIX) 40 MG tablet, TAKE 1 TABLET BY MOUTH TWICE A DAY, Disp: 180 tablet, Rfl: 2   PATADAY 0.2 % SOLN, Place 1 drop into both eyes 2 (two) times daily., Disp: , Rfl: 6   ranolazine (RANEXA) 500 MG 12 hr tablet, TAKE 1 TABLET BY MOUTH TWICE A DAY, Disp: 180 tablet, Rfl: 2   rosuvastatin (CRESTOR) 20 MG tablet, TAKE 1 TABLET BY MOUTH EVERY DAY, Disp: 90 tablet, Rfl: 1   sodium chloride (OCEAN) 0.65 % SOLN nasal spray, Place 1 spray into both nostrils as needed for congestion., Disp: 60 mL, Rfl: 4   vitamin C (ASCORBIC ACID) 500 MG tablet, Take 1,000 mg by mouth daily. , Disp: , Rfl:   Allergies  Allergen Reactions   Codeine Nausea And Vomiting   Hydrocortisone Itching   Lactose Intolerance (Gi) Nausea And Vomiting and Nausea Only     ROS: Review of Systems Pertinent items noted in HPI and remainder of comprehensive ROS otherwise negative.    Physical exam BP  135/71   Pulse 89   Temp (!) 97.2 F (36.2 C)   Ht _0  (1.575 m)   Wt 144 lb 3.2 oz (65.4 kg)   SpO2 98%   BMI 26.37 kg/m  General appearance: alert, cooperative, appears stated age, and no distress Head: Normocephalic, without obvious abnormality, atraumatic Eyes: negative findings: lids and lashes normal, conjunctivae and sclerae normal, corneas clear, and pupils equal, round, reactive to light and accomodation Ears: normal TM's and external ear canals both ears Nose: Nares normal. Septum midline. Mucosa normal. No drainage or sinus tenderness. Throat: lips, mucosa, and tongue normal; teeth and gums normal Neck: no adenopathy, no carotid bruit, supple, symmetrical, trachea midline, and thyroid not enlarged, symmetric, no tenderness/mass/nodules Back: symmetric, no curvature. ROM normal. No CVA tenderness. Lungs: clear to auscultation bilaterally Heart: regular rate and rhythm, S1, S2 normal, no murmur, click, rub or gallop Abdomen: soft, non-tender; bowel sounds normal; no masses,  no organomegaly Extremities: extremities normal, atraumatic, no cyanosis or edema Pulses: 2+ and symmetric Skin: Skin color, texture, turgor normal. No rashes or lesions Lymph nodes: Cervical, supraclavicular, and axillary nodes normal. Neurologic: see MMSE Psych: Mood is stable.  Patient very pleasant, interactive     05/07/2022   10:45 AM 12/16/2020   10:49 AM 09/02/2020    9:27 AM  MMSE - Mini Mental State Exam  Orientation to time _1 Orientation to Place _2 Registration 0 3 3  Attention/ Calculation 0 3 3  Recall 0 1 1  Language- name 2 objects 0 2 2  Language- repeat _3 Language- follow 3 step command _4 Language- read &  follow direction _0 Write a sentence 0 1 1  Copy design 0 1 1  Total score _1 Assessment/ Plan: Luiz Ochoa Hayton here for annual physical exam.   Annual physical exam  Persistent atrial fibrillation ([place]) - Plan: CMP14+EGFR, CBC,  TSH  Moderate dementia without behavioral disturbance (HCC)  Stage 3a chronic kidney disease ([place]) - Plan: CMP14+EGFR, VITAMIN D 25 Hydroxy (Vit-D Deficiency, Fractures), CBC  Dyslipidemia, goal LDL below 70 - Plan: Lipid Panel  Osteopenia with high risk of fracture - Plan: CMP14+EGFR, VITAMIN D 25 Hydroxy (Vit-D Deficiency, Fractures)  CAD S/P percutaneous coronary angioplasty - Plan: Lipid Panel  Need for shingles vaccine  Check labs.  A-fib is both rate and rhythm controlled.  Dementia is progressive.  Marked drop in MMSE score.  I again gave information to Dr. Chong Sicilian office but at this point we discussed that supportive care is likely what is recommended.  I had previous goals of care discussions with this couple but may warrant revisiting.  Shingles vaccination administered  Patient to follow up in 3-6 months, sooner if concerns arise. Regine Christian M. Lajuana Ripple, DO

## 2022-05-07 NOTE — Patient Instructions (Addendum)
Dr Jaynee Eagles: Phone: [phone].  Call to schedule an appointment  Preventive Care 65 Years and Older, Female Preventive care refers to lifestyle choices and visits with your health care provider that can promote health and wellness. Preventive care visits are also called wellness exams. What can I expect for my preventive care visit? Counseling Your health care provider may ask you questions about your: Medical history, including: Past medical problems. Family medical history. Pregnancy and menstrual history. History of falls. Current health, including: Memory and ability to understand (cognition). Emotional well-being. Home life and relationship well-being. Sexual activity and sexual health. Lifestyle, including: Alcohol, nicotine or tobacco, and drug use. Access to firearms. Diet, exercise, and sleep habits. Work and work Statistician. Sunscreen use. Safety issues such as seatbelt and bike helmet use. Physical exam Your health care provider will check your: Height and weight. These may be used to calculate your BMI (body mass index). BMI is a measurement that tells if you are at a healthy weight. Waist circumference. This measures the distance around your waistline. This measurement also tells if you are at a healthy weight and may help predict your risk of certain diseases, such as type 2 diabetes and high blood pressure. Heart rate and blood pressure. Body temperature. Skin for abnormal spots. What immunizations do I need?  Vaccines are usually given at various ages, according to a schedule. Your health care provider will recommend vaccines for you based on your age, medical history, and lifestyle or other factors, such as travel or where you work. What tests do I need? Screening Your health care provider may recommend screening tests for certain conditions. This may include: Lipid and cholesterol levels. Hepatitis C test. Hepatitis B test. HIV (human immunodeficiency  virus) test. STI (sexually transmitted infection) testing, if you are at risk. Lung cancer screening. Colorectal cancer screening. Diabetes screening. This is done by checking your blood sugar (glucose) after you have not eaten for a while (fasting). Mammogram. Talk with your health care provider about how often you should have regular mammograms. BRCA-related cancer screening. This may be done if you have a family history of breast, ovarian, tubal, or peritoneal cancers. Bone density scan. This is done to screen for osteoporosis. Talk with your health care provider about your test results, treatment options, and if necessary, the need for more tests. Follow these instructions at home: Eating and drinking  Eat a diet that includes fresh fruits and vegetables, whole grains, lean protein, and low-fat dairy products. Limit your intake of foods with high amounts of sugar, saturated fats, and salt. Take vitamin and mineral supplements as recommended by your health care provider. Do not drink alcohol if your health care provider tells you not to drink. If you drink alcohol: Limit how much you have to 0-1 drink a day. Know how much alcohol is in your drink. In the [place] one drink equals one 12 oz bottle of beer (355 mL), one 5 oz glass of wine (148 mL), or one 1 oz glass of hard liquor (44 mL). Lifestyle Brush your teeth every morning and night with fluoride toothpaste. Floss one time each day. Exercise for at least 30 minutes 5 or more days each week. Do not use any products that contain nicotine or tobacco. These products include cigarettes, chewing tobacco, and vaping devices, such as e-cigarettes. If you need help quitting, ask your health care provider. Do not use drugs. If you are sexually active, practice safe sex. Use a condom or other form of  protection in order to prevent STIs. Take aspirin only as told by your health care provider. Make sure that you understand how much to take and what  form to take. Work with your health care provider to find out whether it is safe and beneficial for you to take aspirin daily. Ask your health care provider if you need to take a cholesterol-lowering medicine (statin). Find healthy ways to manage stress, such as: Meditation, yoga, or listening to music. Journaling. Talking to a trusted person. Spending time with friends and family. Minimize exposure to UV radiation to reduce your risk of skin cancer. Safety Always wear your seat belt while driving or riding in a vehicle. Do not drive: If you have been drinking alcohol. Do not ride with someone who has been drinking. When you are tired or distracted. While texting. If you have been using any mind-altering substances or drugs. Wear a helmet and other protective equipment during sports activities. If you have firearms in your house, make sure you follow all gun safety procedures. What's next? Visit your health care provider once a year for an annual wellness visit. Ask your health care provider how often you should have your eyes and teeth checked. Stay up to date on all vaccines. This information is not intended to replace advice given to you by your health care provider. Make sure you discuss any questions you have with your health care provider. Document Revised: 03/18/2021 Document Reviewed: 03/18/2021 Elsevier Patient Education  [place].

## 2022-05-08 LAB — CMP14+EGFR
ALT: 15 IU/L (ref 0–32)
AST: 17 IU/L (ref 0–40)
Albumin/Globulin Ratio: 1.6 (ref 1.2–2.2)
Albumin: 4.5 g/dL (ref 3.8–4.8)
Alkaline Phosphatase: 66 IU/L (ref 44–121)
BUN/Creatinine Ratio: 15 (ref 12–28)
BUN: 17 mg/dL (ref 8–27)
Bilirubin Total: 0.8 mg/dL (ref 0.0–1.2)
CO2: 27 mmol/L (ref 20–29)
Calcium: 9.7 mg/dL (ref 8.7–10.3)
Chloride: 97 mmol/L (ref 96–106)
Creatinine, Ser: 1.1 mg/dL — ABNORMAL HIGH (ref 0.57–1.00)
Globulin, Total: 2.8 g/dL (ref 1.5–4.5)
Glucose: 102 mg/dL — ABNORMAL HIGH (ref 70–99)
Potassium: 4.6 mmol/L (ref 3.5–5.2)
Sodium: 138 mmol/L (ref 134–144)
Total Protein: 7.3 g/dL (ref 6.0–8.5)
eGFR: 52 mL/min/{1.73_m2} — ABNORMAL LOW (ref >59)

## 2022-05-08 LAB — CBC
Hematocrit: 43.4 % (ref 34.0–46.6)
Hemoglobin: 14.1 g/dL (ref 11.1–15.9)
MCH: 29.7 pg (ref 26.6–33.0)
MCHC: 32.5 g/dL (ref 31.5–35.7)
MCV: 92 fL (ref 79–97)
Platelets: 249 10*3/uL (ref 150–450)
RBC: 4.74 x10E6/uL (ref 3.77–5.28)
RDW: 12.7 % (ref 11.7–15.4)
WBC: 6.2 10*3/uL (ref 3.4–10.8)

## 2022-05-08 LAB — LIPID PANEL
Chol/HDL Ratio: 2 ratio (ref 0.0–4.4)
Cholesterol, Total: 158 mg/dL (ref 100–199)
HDL: 79 mg/dL (ref >39)
LDL Chol Calc (NIH): 61 mg/dL (ref 0–99)
Triglycerides: 101 mg/dL (ref 0–149)
VLDL Cholesterol Cal: 18 mg/dL (ref 5–40)

## 2022-05-08 LAB — VITAMIN D 25 HYDROXY (VIT D DEFICIENCY, FRACTURES): Vit D, 25-Hydroxy: 45.8 ng/mL (ref 30.0–100.0)

## 2022-05-08 LAB — TSH: TSH: 3.16 u[IU]/mL (ref 0.450–4.500)

## 2022-06-10 ENCOUNTER — Encounter: Payer: Self-pay | Admitting: Neurology

## 2022-06-10 ENCOUNTER — Ambulatory Visit (INDEPENDENT_AMBULATORY_CARE_PROVIDER_SITE_OTHER): Payer: Medicare HMO | Admitting: Neurology

## 2022-06-10 VITALS — BP 135/71 | HR 82 | Ht 60.0 in | Wt 144.2 lb

## 2022-06-10 DIAGNOSIS — F02C0 Dementia in other diseases classified elsewhere, severe, without behavioral disturbance, psychotic disturbance, mood disturbance, and anxiety: Secondary | ICD-10-CM | POA: Diagnosis not present

## 2022-06-10 DIAGNOSIS — G301 Alzheimer's disease with late onset: Secondary | ICD-10-CM | POA: Diagnosis not present

## 2022-06-10 NOTE — Progress Notes (Signed)
[facility]    Provider:  Dr Jaynee Eagles Referring Provider: [name], DO Primary Care Physician:  [name], DO  CC:  memory problems  06/10/2022: we initially saw patient in 2017 for memory problems. Since then she has seen our nurse practitioner several times 05/08/2019 and 11/2019. She no showed a follow up with me. She is here today on Namenda. MRI in 2017 was normal for age. MMSE at that time was 27/30. 05/08/2019 MMSE was 20/30 and medications were discussed. Pcp started namenda in 2021, She reports taking Aricept in October 2020 and had dizziness and elevated blood pressures. MMSE is 18/30 11/2019; 24/30 in 09/2019. Patient was nonadherent with recommendations to follow up in 3 months and we have not seen her since 11/2019. We recommended sleep study she did not want that. We suggested neuropsychiatric testing in the past as well. Her MMSE is 10/30 today. She denies snoring or stopping breathing. She is sleeping ok, no significant snoring or stopping breathing at night. Memory is significant worse. No hallucinations or agitation or depression, They saw family doesn't call or see her. They try to be social. They used to get out quite a bit. She is eating well, gave them information of [facility], discussed Meals on Wheels or they go out a lot. One fall, they decline PT.   Patient complains of symptoms per HPI as well as the following symptoms: worsening memory . Pertinent negatives and positives per HPI. All others negative   HPI:  Kristina Gibbs is a 76 y.o. female here as a referral from Dr. Lajuana Ripple for memory problems. PMHx CAD s/p MI 1995 with percutaneous revascularization, right coronary stenting 1996, multivessel bypass 2010,  HTN, HLD, previous smoker. Memory problems for 3-4 years, no head trauma or other inciting events but possibly starting around her bypass. She started noticing she was saying things wrong, spelling things wrong, writing  worsening. She forgets where she puts things, she forgets peoples names but may remember weeks later or asks her husband, they can be people she just met or people she knows. In the last 6 months it is getting worse, difficulties remembering dates, has to write things down and forgets where she put the notes and she has to search for the paper. Weeks later she finds the paper and forgets why she wrote it. She can't remember the names of people in church, these are people she has known for a few years and she is so embarrassed because she can't remember their names. Patient pays all the bills, she missed two bills recently to the Dermatologist. She drives but not long distances. She has gotten lost when she drives but she often finds her way eventually.  She is repeating things, not remembering conversations she has with noticing. Husband is noticing and daughter is noticing. No hallucinations or delusions. Having weird dreams. Has good memory for the past. More short-term and recent memory. Mother started having dementia in her 57s and in her 24s started worsening significantly. She forgets appointments. No hallucinations or delusions.  Reviewed notes, labs and imaging from outside physicians, which showed:   11/2015: Hep c neg, CMP with 1.14 creatinine otherwise unremarkable, ldl 81  In 2015 hgba1c 5.5  Reviewed office visits from family medicine, patient complaining of memory loss for a few years, explained the time she gets to the room and forgets she went into the room for. She also forgets events. She's had several Mini-Mental status exam which  have been stable from 2070 29/30. She is concerned for her short-term memory being poor. She reports that she been tested several times for dementia. She tends church and Bible studies has occasional word finding difficulty. In October 2016 patient reported memory changes stable for about a year.  Review of Systems: Patient complains of symptoms per HPI as well  as the following symptoms: Weight gain, blurred vision, easy bruising, easy bleeding, chest pain, swelling and legs, shortness of breath, cough, feeling cold, increased thirst, flushing, ringing in ears, blood in stools, constipation, joint pain, allergies, runny nose, frequent infections, memory loss, confusion, headache, restless legs. Pertinent negatives per HPI. All others negative.   Social History   Socioeconomic History   Marital status: Married    Spouse name: [name]    Number of children: 3   Years of education: 12   Highest education level: High school graduate  Occupational History   Occupation: Retired  Tobacco Use   Smoking status: Former    Packs/day: 0.50    Years: 49.00    Total pack years: 24.50    Types: Cigarettes    Quit date: 10/04/2008    Years since quitting: 13.6   Smokeless tobacco: Never  Vaping Use   Vaping Use: Never used  Substance and Sexual Activity   Alcohol use: Yes    Alcohol/week: 1.0 standard drink of alcohol    Types: 1 Glasses of wine per week    Comment: red wine once per month   Drug use: No   Sexual activity: Not Currently    Birth control/protection: Surgical  Other Topics Concern   Not on file  Social History Narrative   [place]/ [place]   Caffeine use: 3 cups coffee/day   Social Determinants of Health   Financial Resource Strain: Low Risk  (03/12/2022)   Overall Financial Resource Strain (CARDIA)    Difficulty of Paying Living Expenses: Not hard at all  Food Insecurity: No Food Insecurity (03/12/2022)   Hunger Vital Sign    Worried About Running Out of Food in the Last Year: Never true    Ran Out of Food in the Last Year: Never true  Transportation Needs: No Transportation Needs (03/12/2022)   PRAPARE - Hydrologist (Medical): No    Lack of Transportation (Non-Medical): No  Physical Activity: Sufficiently Active (03/12/2022)   Exercise Vital Sign    Days of Exercise per Week: 7  days    Minutes of Exercise per Session: 30 min  Stress: No Stress Concern Present (03/12/2022)   [place]    Feeling of Stress : Not at all  Social Connections: [place] (03/12/2022)   Social Connection and Isolation Panel [NHANES]    Frequency of Communication with Friends and Family: More than three times a week    Frequency of Social Gatherings with Friends and Family: More than three times a week    Attends Religious Services: More than 4 times per year    Active Member of [employer] or Organizations: Yes    Attends Archivist Meetings: More than 4 times per year    Marital Status: Married  Human resources officer Violence: Not At Risk (03/12/2022)   Humiliation, Afraid, Rape, and Kick questionnaire    Fear of Current or Ex-Partner: No    Emotionally Abused: No    Physically Abused: No    Sexually Abused: No    Family  History  Problem Relation Age of Onset   Stroke Mother    Diabetes Mother    Kidney disease Mother    Hypertension Mother    Hyperlipidemia Mother    Dementia Mother    Heart disease Daughter        heart attack x2   Heart disease Brother        2 heart attacks   Breast cancer Maternal Aunt    Colon cancer Neg Hx    Stomach cancer Neg Hx    Esophageal cancer Neg Hx    Rectal cancer Neg Hx    Liver cancer Neg Hx     Past Medical History:  Diagnosis Date   Allergy    Arthritis    Cancer ([place])    ovarian   Coronary artery disease    Heart murmur    Hypercholesteremia    Hypertension    Memory impairment     MMSE 27/30   Osteopenia    Stomach problems    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   Right Shin   Superficial basal cell carcinoma (BCC) 03/30/1999   Left Thigh (treatment)   Unstable angina ([place]) 05/27/2014    Past Surgical History:  Procedure Laterality Date   ABDOMINAL HYSTERECTOMY     CARDIOVERSION N/A 01/25/2018   Procedure: CARDIOVERSION;   Surgeon: Acie Fredrickson Wonda Cheng, MD;  Location: [facility] ENDOSCOPY;  Service: Cardiovascular;  Laterality: N/A;   CHOLECYSTECTOMY     CORONARY ARTERY BYPASS GRAFT     EYE SURGERY     bilateral cataract extraction   HAND SURGERY Left    injurred hand at work- nerve repair   hysterectomy     LEFT HEART CATHETERIZATION WITH CORONARY ANGIOGRAM N/A 05/27/2014   Procedure: LEFT HEART CATHETERIZATION WITH CORONARY ANGIOGRAM;  Surgeon: [name], MD;  Location: [facility] CATH LAB;  Service: Cardiovascular;  Laterality: N/A;   PCTA     x7     Current Outpatient Medications  Medication Sig Dispense Refill   amLODipine (NORVASC) 2.5 MG tablet TAKE 1 TABLET BY MOUTH EVERY DAY 90 tablet 3   aspirin EC 81 MG tablet Take 81 mg by mouth at bedtime.      b complex vitamins tablet Take 1 tablet by mouth daily.      calcium carbonate (OS-CAL) 600 MG TABS tablet Take 1 tablet (600 mg total) by mouth daily. 30 tablet 3   cetirizine (ZYRTEC ALLERGY) 10 MG tablet Take 1 tablet (10 mg total) by mouth daily. 90 tablet 3   cholecalciferol (VITAMIN D) 1000 UNITS tablet Take 1,000 Units by mouth daily.     CO ENZYME Q-10 PO Take 1 capsule by mouth daily.      ELIQUIS 5 MG TABS tablet TAKE 1 TABLET BY MOUTH TWICE A DAY 60 tablet 5   Ferrous Gluconate (IRON 27 PO) Take by mouth. Pt isn't sure of the dosage of otc iron taken daily.     furosemide (LASIX) 20 MG tablet TAKE 1 TABLET BY MOUTH EVERY DAY 90 tablet 0   memantine (NAMENDA) 10 MG tablet Take 1 tablet (10 mg total) by mouth 2 (two) times daily. 180 tablet 4   metoprolol succinate (TOPROL-XL) 50 MG 24 hr tablet TAKE 1 TABLET (50 MG TOTAL) BY MOUTH IN THE MORNING AND AT BEDTIME. 180 tablet 2   nitroGLYCERIN (NITROLINGUAL) 0.4 MG/SPRAY spray PLACE 1 SPRAY UNDER THE TONGUE EVERY 5 (FIVE) MINUTES AS NEEDED FOR CHEST PAIN. 12 g 1  omega-3 acid ethyl esters (LOVAZA) 1 g capsule Take 1 capsule (1 g total) by mouth 4 (four) times daily. 360 capsule 4   pantoprazole (PROTONIX) 40  MG tablet TAKE 1 TABLET BY MOUTH TWICE A DAY 180 tablet 2   PATADAY 0.2 % SOLN Place 1 drop into both eyes 2 (two) times daily.  6   ranolazine (RANEXA) 500 MG 12 hr tablet TAKE 1 TABLET BY MOUTH TWICE A DAY 180 tablet 2   rosuvastatin (CRESTOR) 20 MG tablet TAKE 1 TABLET BY MOUTH EVERY DAY 90 tablet 1   sodium chloride (OCEAN) 0.65 % SOLN nasal spray Place 1 spray into both nostrils as needed for congestion. 60 mL 4   vitamin C (ASCORBIC ACID) 500 MG tablet Take 1,000 mg by mouth daily.      No current facility-administered medications for this visit.    Allergies as of 06/10/2022 - Review Complete 06/10/2022  Allergen Reaction Noted   Codeine Nausea And Vomiting    Hydrocortisone Itching 04/17/2013   Lactose intolerance (gi) Nausea And Vomiting and Nausea Only 03/05/2017    Vitals: BP 135/71   Pulse 82   Ht 5' (1.524 m)   Wt 144 lb 3.2 oz (65.4 kg)   BMI 28.16 kg/m  Last Weight:  Wt Readings from Last 1 Encounters:  06/10/22 144 lb 3.2 oz (65.4 kg)   Last Height:   Ht Readings from Last 1 Encounters:  06/10/22 5' (1.524 m)  Exam: NAD, pleasant                  Speech:    Speech is normal; fluent and spontaneous  Cognition:    06/10/2022    2:52 PM 05/07/2022   10:45 AM 12/16/2020   10:49 AM  MMSE - Mini Mental State Exam  Orientation to time 1 1 2   Orientation to Place 1 2 2   Registration 3 0 3  Attention/ Calculation 0 0 3  Recall 0 0 1  Language- name 2 objects 1 0 2  Language- repeat 0 1 1  Language- follow 3 step command 2 1 3   Language- read & follow direction 1 1 1   Write a sentence 1 0 1  Copy design 0 0 1  Total score 10 6 20       Cranial Nerves:    The pupils are equal, round, and reactive to light.Trigeminal sensation is intact and the muscles of mastication are normal. The face is symmetric. The palate elevates in the midline. Hearing intact. Voice is normal. Shoulder shrug is normal. The tongue has normal motion without fasciculations.    Coordination:  No dysmetria  Motor Observation:    No asymmetry, no atrophy, and no involuntary movements noted. Tone:    Normal muscle tone.     Strength:    Strength is V/V in the upper and lower limbs.      Sensation: intact to LT       Assessment/Plan:  Absolutely lovely 76 year old with 10 years of memory problems. we initially saw patient in 2017 for memory problems. Since then she has seen our nurse practitioner several times 05/08/2019 and 11/2019. She no showed a follow up with me. She is here today on Namenda. MRI in 2017 was normal for age. MMSE at that time was 27/30. 05/08/2019 MMSE was 20/30 and medications were discussed. Pcp started namenda around early 2021 after she had side effects to aricept, She reports taking Aricept in October 2020 and had dizziness  and elevated blood pressures. MMSE was 18/30 11/2019; 14/30 in 09/2019. Patient was nonadherent with recommendations to follow up in 3 months and we have not seen her since 11/2019. We recommended sleep study at one time and she didn't think that was something for her. We suggested neuropsychiatric testing in the past as well. Her MMSE is 10/30 today. She denies snoring or stopping breathing. She is sleeping ok, no significant snoring or stopping breathing at night. Memory is significant worse. Luckily No hallucinations or agitation or depression, They say family doesn't call or see her, so very unfortunate. They try to be social. They used to get out quite a bit. She is eating "ok", gave them information of [facility], discussed Meals on Wheels or they go out a lot. One fall, they decline PT. Likely Alzheimer's, at this point we discussed quality of life and trying to get some help such a [facility] to help with patient and also with husband who is caretaker.  - MMSE 10/30 - had side effects to aricept, continue namenda. Not a candidate for lecanumab - discussed home PT, declined at this time - declined formal  memory testing in the past,at this time would not be able to complete - discussed [facility], gave information and local resources - At this time there is not much more neurology can provide, will return to primary care     Sarina Ill, MD  [facility] [address] [place], [place] [address]  Phone [phone] Fax [phone]  I spent 30 minutes of face-to-face and non-face-to-face time with patient on the  1. Severe late onset Alzheimer's dementia without behavioral disturbance, psychotic disturbance, mood disturbance, or anxiety (HCC)    diagnosis.  This included previsit chart review, lab review, study review, order entry, electronic health record documentation, patient education on the different diagnostic and therapeutic options, counseling and coordination of care, risks and benefits of management, compliance, or risk factor reduction

## 2022-06-20 ENCOUNTER — Other Ambulatory Visit: Payer: Self-pay | Admitting: Cardiovascular Disease

## 2022-06-27 ENCOUNTER — Other Ambulatory Visit: Payer: Self-pay | Admitting: Family Medicine

## 2022-06-27 DIAGNOSIS — R609 Edema, unspecified: Secondary | ICD-10-CM

## 2022-07-06 ENCOUNTER — Encounter: Payer: Self-pay | Admitting: Nurse Practitioner

## 2022-07-06 ENCOUNTER — Ambulatory Visit (INDEPENDENT_AMBULATORY_CARE_PROVIDER_SITE_OTHER): Payer: Medicare HMO | Admitting: Nurse Practitioner

## 2022-07-06 VITALS — BP 138/76 | HR 86 | Temp 98.4°F | Ht 60.0 in | Wt 143.0 lb

## 2022-07-06 DIAGNOSIS — Z23 Encounter for immunization: Secondary | ICD-10-CM | POA: Diagnosis not present

## 2022-07-06 DIAGNOSIS — M79641 Pain in right hand: Secondary | ICD-10-CM

## 2022-07-06 DIAGNOSIS — W548XXA Other contact with dog, initial encounter: Secondary | ICD-10-CM

## 2022-07-06 MED ORDER — DOXYCYCLINE HYCLATE 100 MG PO TABS: 100.0000 mg | ORAL_TABLET | Freq: Two times a day (BID) | ORAL | 0 refills | Status: DC

## 2022-07-06 NOTE — Patient Instructions (Signed)

## 2022-07-06 NOTE — Progress Notes (Signed)
Acute Office Visit  Subjective:     Patient ID: Kristina Gibbs, female    DOB: Jun 27, 1946, 76 y.o.   MRN: [id number]  Chief Complaint  Patient presents with   Arm Pain    Dog scratched her - 4 days    HPI Patient is in today for Right hand Pain from dog scratch  She reports new onset right hand pain. was an injury that may have caused the pain. The pain started yesterday and is staying constant. The pain does not radiate . The pain is described as aching, is 4/10 in intensity, occurring constantly.  Aggravating factors: none Relieving factors: none.  She has tried acetaminophen with little relief.   ---------------------------------------------------------------------------------------------------   Review of Systems  Constitutional:  Positive for chills.  HENT: Negative.    Respiratory: Negative.    Cardiovascular: Negative.   Genitourinary: Negative.   Skin:        Right arm skin tear  All other systems reviewed and are negative.       Objective:    BP 138/76   Pulse 86   Temp 98.4 F (36.9 C)   Ht 5' (1.524 m)   Wt 143 lb (64.9 kg)   SpO2 96%   BMI 27.93 kg/m  BP Readings from Last 3 Encounters:  07/06/22 138/76  06/10/22 135/71  05/07/22 135/71      Physical Exam Vitals and nursing note reviewed.  Constitutional:      Appearance: Normal appearance.  HENT:     Right Ear: External ear normal.     Left Ear: External ear normal.     Nose: Nose normal.     Mouth/Throat:     Mouth: Mucous membranes are moist.  Eyes:     Conjunctiva/sclera: Conjunctivae normal.  Cardiovascular:     Rate and Rhythm: Normal rate and regular rhythm.     Pulses: Normal pulses.     Heart sounds: Normal heart sounds.  Pulmonary:     Effort: Pulmonary effort is normal.  Abdominal:     General: Bowel sounds are normal.  Skin:    Findings: Erythema and laceration present.          Comments: Skin tear right arm  Neurological:     General: No focal deficit  present.     Mental Status: She is alert and oriented to person, place, and time.  Psychiatric:        Mood and Affect: Mood normal.        Behavior: Behavior normal.     No results found for any visits on 07/06/22.      Assessment & Plan:  Skin tear from dog bite 2 days ago, erythema, tenderness and mild warmth, wound cleaned and dressed with saline, Vaseline gauze. Doxycyline 100 mg tablet by mouth, follow up with worsening unresolved symptoms  Problem List Items Addressed This Visit   None Visit Diagnoses     Dog scratch    -  Primary   Relevant Medications   doxycycline (VIBRA-TABS) 100 MG tablet   Need for immunization against influenza       Relevant Orders   Flu Vaccine QUAD High Dose(Fluad) (Completed)       Meds ordered this encounter  Medications   doxycycline (VIBRA-TABS) 100 MG tablet    Sig: Take 1 tablet (100 mg total) by mouth 2 (two) times daily.    Dispense:  14 tablet    Refill:  0    Order Specific Question:  Supervising Provider    Answer:   Claretta Fraise [[id number]]    Return if symptoms worsen or fail to improve.  Ivy Lynn, NP

## 2022-07-15 DIAGNOSIS — I70203 Unspecified atherosclerosis of native arteries of extremities, bilateral legs: Secondary | ICD-10-CM | POA: Diagnosis not present

## 2022-07-15 DIAGNOSIS — M79676 Pain in unspecified toe(s): Secondary | ICD-10-CM | POA: Diagnosis not present

## 2022-07-15 DIAGNOSIS — B351 Tinea unguium: Secondary | ICD-10-CM | POA: Diagnosis not present

## 2022-07-15 DIAGNOSIS — L84 Corns and callosities: Secondary | ICD-10-CM | POA: Diagnosis not present

## 2022-07-22 ENCOUNTER — Other Ambulatory Visit: Payer: Self-pay | Admitting: Cardiovascular Disease

## 2022-07-22 NOTE — Telephone Encounter (Signed)
Prescription refill request for Eliquis received. Indication:Afib Last office visit:12/22 Scr:1.1 Age: 76 Weight:64.9 kg  Prescription refilled

## 2022-07-25 ENCOUNTER — Other Ambulatory Visit: Payer: Self-pay | Admitting: Cardiovascular Disease

## 2022-08-06 ENCOUNTER — Ambulatory Visit: Payer: Medicare HMO | Admitting: Family Medicine

## 2022-09-17 ENCOUNTER — Other Ambulatory Visit: Payer: Self-pay | Admitting: Cardiovascular Disease

## 2022-10-01 ENCOUNTER — Other Ambulatory Visit: Payer: Self-pay | Admitting: Family Medicine

## 2022-10-01 DIAGNOSIS — R609 Edema, unspecified: Secondary | ICD-10-CM

## 2022-10-05 NOTE — Telephone Encounter (Signed)
Tried to call pt to see which pharmacy she would like [place] to send rx to since [facility] is not in network. No answer and VM is full.

## 2022-10-17 ENCOUNTER — Other Ambulatory Visit: Payer: Self-pay | Admitting: Cardiovascular Disease

## 2022-10-22 ENCOUNTER — Other Ambulatory Visit: Payer: Self-pay | Admitting: Cardiovascular Disease

## 2022-11-09 ENCOUNTER — Ambulatory Visit: Payer: Medicare (Managed Care) | Attending: Cardiovascular Disease | Admitting: Cardiovascular Disease

## 2022-11-09 ENCOUNTER — Encounter: Payer: Self-pay | Admitting: Cardiovascular Disease

## 2022-11-09 VITALS — BP 116/68 | HR 87 | Ht 60.0 in | Wt 142.6 lb

## 2022-11-09 DIAGNOSIS — E785 Hyperlipidemia, unspecified: Secondary | ICD-10-CM

## 2022-11-09 DIAGNOSIS — Z951 Presence of aortocoronary bypass graft: Secondary | ICD-10-CM | POA: Diagnosis not present

## 2022-11-09 DIAGNOSIS — I10 Essential (primary) hypertension: Secondary | ICD-10-CM

## 2022-11-09 DIAGNOSIS — I4819 Other persistent atrial fibrillation: Secondary | ICD-10-CM | POA: Diagnosis not present

## 2022-11-09 NOTE — Progress Notes (Signed)
11/09/2022 Kristina Gibbs   [birth year]  [id number]  Primary Physician [name], DO Primary Cardiologist: [name] MD FACP, [place], [place], [place]  HPI:  Kristina Gibbs is a 77 y.o.    mildly overweight Caucasian female with a history of CAD previously taken care of by Dr. Rollene Fare. I last saw her in the office 09/18/2021.  She is accompanied by her husband [name] today.  Unfortunately, she has had progressive dementia.  She has a history of anterior wall myocardial infarction in 1995 with subsequent percutaneous revascularization to her RCA and LAD. She ultimately requiredmultivessel bypass surgery by Dr. Roxan Hockey March of 2000 and LIMA to LAD, vein to a marginal branch of the circumflex and distal right coronary artery. Problems included hypertension and hyperlipidemia. I performed cardiac catheterization on her in 2010 revealing occluded vein grafts, patent LIMA and normal LV function.  Since I saw her in the office year ago she's remained medically stable with only a few episodes of nitrate responsive chest pain.   She was recently hospitalized overnight for chest pain 03/05/17. She ruled out for myocardial infarction. She was seen in consultation by Dr. Jacinta Shoe  recommended a Myoview stress test that was performed. This showed no ischemic ischemia. She's had no recurrent chest pain. Since I saw her in the office for months ago she's remained currently stable.  She was in A. fib with RVR when I saw her last and underwent DC cardioversion successfully to sinus rhythm.  She is on Eliquis oral anticoagulation.   She was admitted to the hospital on 12/04/2020 with chest pain, A. fib with RVR.  She ruled out for myocardial infarction.  She was seen in consultation by Dr. Harl Bowie who felt that her chest pain was referred related to demand ischemia and suggested altering her beta-blocker.  Her husband does say that it is difficult to keep up with her medications given her  progressive dementia.  Since I saw her in the office a year ago she has had progressive dementia according to her husband.  She gets occasional atypical chest pain but otherwise has remained stable.   Current Meds  Medication Sig   amLODipine (NORVASC) 2.5 MG tablet TAKE 1 TABLET BY MOUTH EVERY DAY   aspirin EC 81 MG tablet Take 81 mg by mouth at bedtime.    b complex vitamins tablet Take 1 tablet by mouth daily.    calcium carbonate (OS-CAL) 600 MG TABS tablet Take 1 tablet (600 mg total) by mouth daily.   cetirizine (ZYRTEC ALLERGY) 10 MG tablet Take 1 tablet (10 mg total) by mouth daily.   cholecalciferol (VITAMIN D) 1000 UNITS tablet Take 1,000 Units by mouth daily.   CO ENZYME Q-10 PO Take 1 capsule by mouth 2 (two) times daily.   doxycycline (VIBRA-TABS) 100 MG tablet Take 1 tablet (100 mg total) by mouth 2 (two) times daily.   ELIQUIS 5 MG TABS tablet TAKE 1 TABLET BY MOUTH TWICE A DAY   Ferrous Gluconate (IRON 27 PO) Take by mouth. Pt isn't sure of the dosage of otc iron taken daily.   furosemide (LASIX) 20 MG tablet TAKE 1 TABLET BY MOUTH EVERY DAY   memantine (NAMENDA) 10 MG tablet Take 1 tablet (10 mg total) by mouth 2 (two) times daily.   metoprolol succinate (TOPROL-XL) 50 MG 24 hr tablet TAKE 1 TABLET (50 MG TOTAL) BY MOUTH IN THE MORNING AND AT BEDTIME.   omega-3 acid ethyl esters (LOVAZA) 1  g capsule Take 1 capsule (1 g total) by mouth 4 (four) times daily.   pantoprazole (PROTONIX) 40 MG tablet TAKE 1 TABLET BY MOUTH TWICE A DAY   PATADAY 0.2 % SOLN Place 1 drop into both eyes 2 (two) times daily.   ranolazine (RANEXA) 500 MG 12 hr tablet TAKE 1 TABLET BY MOUTH TWICE A DAY   rosuvastatin (CRESTOR) 20 MG tablet TAKE 1 TABLET BY MOUTH EVERY DAY   sodium chloride (OCEAN) 0.65 % SOLN nasal spray Place 1 spray into both nostrils as needed for congestion.   vitamin C (ASCORBIC ACID) 500 MG tablet Take 1,000 mg by mouth daily.      Allergies  Allergen Reactions   Codeine  Nausea And Vomiting   Hydrocortisone Itching   Lactose Intolerance (Gi) Nausea And Vomiting and Nausea Only    Social History   Socioeconomic History   Marital status: Married    Spouse name: [name]    Number of children: 3   Years of education: 12   Highest education level: High school graduate  Occupational History   Occupation: Retired  Tobacco Use   Smoking status: Former    Packs/day: 0.50    Years: 49.00    Total pack years: 24.50    Types: Cigarettes    Quit date: 10/04/2008    Years since quitting: 14.1   Smokeless tobacco: Never  Vaping Use   Vaping Use: Never used  Substance and Sexual Activity   Alcohol use: Yes    Alcohol/week: 1.0 standard drink of alcohol    Types: 1 Glasses of wine per week    Comment: red wine once per month   Drug use: No   Sexual activity: Not Currently    Birth control/protection: Surgical  Other Topics Concern   Not on file  Social History Narrative   L[place] w/ husband [place]   Caffeine use: 3 cups coffee/day   Social Determinants of Health   Financial Resource Strain: Low Risk  (03/12/2022)   Overall Financial Resource Strain (CARDIA)    Difficulty of Paying Living Expenses: Not hard at all  Food Insecurity: No Food Insecurity (03/12/2022)   Hunger Vital Sign    Worried About Running Out of Food in the Last Year: Never true    Ran Out of Food in the Last Year: Never true  Transportation Needs: No Transportation Needs (03/12/2022)   PRAPARE - Hydrologist (Medical): No    Lack of Transportation (Non-Medical): No  Physical Activity: Sufficiently Active (03/12/2022)   Exercise Vital Sign    Days of Exercise per Week: 7 days    Minutes of Exercise per Session: 30 min  Stress: No Stress Concern Present (03/12/2022)   [place]    Feeling of Stress : Not at all  Social Connections: [place] (03/12/2022)   Social  Connection and Isolation Panel [NHANES]    Frequency of Communication with Friends and Family: More than three times a week    Frequency of Social Gatherings with Friends and Family: More than three times a week    Attends Religious Services: More than 4 times per year    Active Member of [employer] or Organizations: Yes    Attends Archivist Meetings: More than 4 times per year    Marital Status: Married  Human resources officer Violence: Not At Risk (03/12/2022)   Humiliation, Afraid, Rape, and Kick questionnaire  Fear of Current or Ex-Partner: No    Emotionally Abused: No    Physically Abused: No    Sexually Abused: No     Review of Systems: General: negative for chills, fever, night sweats or weight changes.  Cardiovascular: negative for chest pain, dyspnea on exertion, edema, orthopnea, palpitations, paroxysmal nocturnal dyspnea or shortness of breath Dermatological: negative for rash Respiratory: negative for cough or wheezing Urologic: negative for hematuria Abdominal: negative for nausea, vomiting, diarrhea, bright red blood per rectum, melena, or hematemesis Neurologic: negative for visual changes, syncope, or dizziness All other systems reviewed and are otherwise negative except as noted above.    Blood pressure 116/68, pulse 87, height 5' (1.524 m), weight 142 lb 9.6 oz (64.7 kg), SpO2 97 %.  General appearance: alert and no distress Neck: no adenopathy, no carotid bruit, no JVD, supple, symmetrical, trachea midline, and thyroid not enlarged, symmetric, no tenderness/mass/nodules Lungs: clear to auscultation bilaterally Heart: irregularly irregular rhythm Extremities: extremities normal, atraumatic, no cyanosis or edema Pulses: 2+ and symmetric Skin: Skin color, texture, turgor normal. No rashes or lesions Neurologic: Grossly normal  EKG atrial fibrillation with a ventricular sponsor of 87 and nonspecific ST and T wave changes.  There were septal Q waves.  I personally  reviewed this EKG.  ASSESSMENT AND PLAN:   Hx of CABG History of CAD status post anterolateral infarction 1995 with subsequent intervention of the RCA and LAD.  She ultimately required multivessel CABG with her Hendricksen 2000 with a LIMA to her LAD, vein to marginal branch of the circumflex and to the distal right coronary artery.  I performed cardiac catheterization on her in 2010 revealing occluded vein grafts with a patent LIMA and normal LV function.  She had nonischemic Myoview since that time.  She gets occasional episodes of chest pain which are pretty infrequent.  Dyslipidemia, goal LDL below 70 History of dyslipidemia on statin therapy and Lovaza with lipid profile performed 8//23 revealing total cholesterol 158, LDL 61 and HDL 79.  Essential hypertension History of essential hypertension blood pressure measured today at 116/68.  She is on amlodipine and metoprolol.  Persistent atrial fibrillation History of persistent atrial fibrillation rate controlled on Eliquis oral anticoagulation.     [name] MD FACP,FACC,FAHA, [facility] 11/09/2022 11:43 AM

## 2022-11-09 NOTE — Patient Instructions (Signed)

## 2022-11-09 NOTE — Assessment & Plan Note (Signed)
History of CAD status post anterolateral infarction 1995 with subsequent intervention of the RCA and LAD.  She ultimately required multivessel CABG with her Hendricksen 2000 with a LIMA to her LAD, vein to marginal branch of the circumflex and to the distal right coronary artery.  I performed cardiac catheterization on her in 2010 revealing occluded vein grafts with a patent LIMA and normal LV function.  She had nonischemic Myoview since that time.  She gets occasional episodes of chest pain which are pretty infrequent.

## 2022-11-09 NOTE — Assessment & Plan Note (Addendum)
History of dyslipidemia on statin therapy and Lovaza with lipid profile performed 8//23 revealing total cholesterol 158, LDL 61 and HDL 79.

## 2022-11-09 NOTE — Assessment & Plan Note (Signed)
History of essential hypertension blood pressure measured today at 116/68.  She is on amlodipine and metoprolol.

## 2022-11-09 NOTE — Assessment & Plan Note (Signed)
History of persistent atrial fibrillation rate controlled on Eliquis oral anticoagulation.

## 2023-03-10 ENCOUNTER — Other Ambulatory Visit: Payer: Self-pay | Admitting: Internal Medicine

## 2023-03-10 DIAGNOSIS — Z1231 Encounter for screening mammogram for malignant neoplasm of breast: Secondary | ICD-10-CM

## 2023-04-25 ENCOUNTER — Ambulatory Visit
Admission: RE | Admit: 2023-04-25 | Discharge: 2023-04-25 | Disposition: A | Discharge status: Home / Self Care | Payer: Medicare (Managed Care) | Source: Ambulatory Visit | Attending: Internal Medicine | Admitting: Internal Medicine

## 2023-04-25 DIAGNOSIS — Z1231 Encounter for screening mammogram for malignant neoplasm of breast: Secondary | ICD-10-CM

## 2023-09-07 ENCOUNTER — Emergency Department (HOSPITAL_COMMUNITY)
Admission: EM | Admit: 2023-09-07 | Discharge: 2023-09-07 | Discharge status: Against Medical Advice | Payer: Medicare (Managed Care) | Attending: Emergency Medicine | Admitting: Emergency Medicine

## 2023-09-07 DIAGNOSIS — R1031 Right lower quadrant pain: Secondary | ICD-10-CM | POA: Insufficient documentation

## 2023-09-07 DIAGNOSIS — F0390 Unspecified dementia without behavioral disturbance: Secondary | ICD-10-CM | POA: Diagnosis not present

## 2023-09-07 DIAGNOSIS — Z5321 Procedure and treatment not carried out due to patient leaving prior to being seen by health care provider: Secondary | ICD-10-CM | POA: Diagnosis not present

## 2023-09-07 LAB — CBC WITH DIFFERENTIAL/PLATELET
Abs Immature Granulocytes: 0.03 10*3/uL (ref 0.00–0.07)
Basophils Absolute: 0.1 10*3/uL (ref 0.0–0.1)
Basophils Relative: 1 %
Eosinophils Absolute: 0.1 10*3/uL (ref 0.0–0.5)
Eosinophils Relative: 2 %
HCT: 42.9 % (ref 36.0–46.0)
Hemoglobin: 13.8 g/dL (ref 12.0–15.0)
Immature Granulocytes: 0 %
Lymphocytes Relative: 19 %
Lymphs Abs: 1.4 10*3/uL (ref 0.7–4.0)
MCH: 29.7 pg (ref 26.0–34.0)
MCHC: 32.2 g/dL (ref 30.0–36.0)
MCV: 92.3 fL (ref 80.0–100.0)
Monocytes Absolute: 0.7 10*3/uL (ref 0.1–1.0)
Monocytes Relative: 9 %
Neutro Abs: 5 10*3/uL (ref 1.7–7.7)
Neutrophils Relative %: 69 %
Platelets: 228 10*3/uL (ref 150–400)
RBC: 4.65 MIL/uL (ref 3.87–5.11)
RDW: 13 % (ref 11.5–15.5)
WBC: 7.3 10*3/uL (ref 4.0–10.5)
nRBC: 0 % (ref 0.0–0.2)

## 2023-09-07 LAB — COMPREHENSIVE METABOLIC PANEL
ALT: 14 U/L (ref 0–44)
AST: 16 U/L (ref 15–41)
Albumin: 3.4 g/dL — ABNORMAL LOW (ref 3.5–5.0)
Alkaline Phosphatase: 51 U/L (ref 38–126)
Anion gap: 8 (ref 5–15)
BUN: 24 mg/dL — ABNORMAL HIGH (ref 8–23)
CO2: 25 mmol/L (ref 22–32)
Calcium: 9.1 mg/dL (ref 8.9–10.3)
Chloride: 105 mmol/L (ref 98–111)
Creatinine, Ser: 1.07 mg/dL — ABNORMAL HIGH (ref 0.44–1.00)
GFR, Estimated: 53 mL/min — ABNORMAL LOW (ref >60)
Glucose, Bld: 85 mg/dL (ref 70–99)
Potassium: 4 mmol/L (ref 3.5–5.1)
Sodium: 138 mmol/L (ref 135–145)
Total Bilirubin: 0.8 mg/dL (ref <1.2)
Total Protein: 6.6 g/dL (ref 6.5–8.1)

## 2023-09-07 LAB — LIPASE, BLOOD: Lipase: 46 U/L (ref 11–51)

## 2023-09-07 NOTE — ED Notes (Signed)
Pt not answering to call for vitals in the lobby

## 2023-09-07 NOTE — ED Triage Notes (Signed)
Patient's family provides history d/t patient dementia. Family reports that provider at [facility] assessed and found RLQ pain with palpation today but the pain has been intermittent x 2 weeks. Patient does not appear in distress or uncomfortable at this time.

## 2023-09-07 NOTE — ED Provider Triage Note (Signed)
Emergency Medicine Provider Triage Evaluation Note  [name] , a 77 y.o. female  was evaluated in triage.  Pt complains of right lower quadrant.  Abdominal pain.  Intermittent for the past 2 to 3 weeks.  Worse today.  No fevers.  History of dementia, poor historian  Review of Systems  Positive: As above Negative: As above  Physical Exam  BP (!) 110/53   Pulse 88   Temp 99 F (37.2 C) (Oral)   Resp 18   Ht 5' (1.524 m)   Wt 59 kg   SpO2 99%   BMI 25.39 kg/m  Gen:   Awake, no distress   Resp:  Normal effort  MSK:   Moves extremities without difficulty  Other:    Medical Decision Making  Medically screening exam initiated at 4:11 PM.  Appropriate orders placed.  Shavannah Steuer Koman was informed that the remainder of the evaluation will be completed by another provider, this initial triage assessment does not replace that evaluation, and the importance of remaining in the ED until their evaluation is complete.  Workup initiated   Michelle Piper, Cordelia Poche 09/07/23 1611

## 2023-10-14 ENCOUNTER — Emergency Department (HOSPITAL_COMMUNITY)
Admission: EM | Admit: 2023-10-14 | Discharge: 2023-10-14 | Disposition: A | Discharge status: Home / Self Care | Payer: Medicare (Managed Care) | Attending: Emergency Medicine | Admitting: Emergency Medicine

## 2023-10-14 ENCOUNTER — Emergency Department (HOSPITAL_COMMUNITY): Payer: Medicare (Managed Care)

## 2023-10-14 ENCOUNTER — Encounter (HOSPITAL_COMMUNITY): Payer: Self-pay

## 2023-10-14 DIAGNOSIS — I10 Essential (primary) hypertension: Secondary | ICD-10-CM | POA: Diagnosis not present

## 2023-10-14 DIAGNOSIS — Z8543 Personal history of malignant neoplasm of ovary: Secondary | ICD-10-CM | POA: Diagnosis not present

## 2023-10-14 DIAGNOSIS — Z7901 Long term (current) use of anticoagulants: Secondary | ICD-10-CM | POA: Insufficient documentation

## 2023-10-14 DIAGNOSIS — Z79899 Other long term (current) drug therapy: Secondary | ICD-10-CM | POA: Diagnosis not present

## 2023-10-14 DIAGNOSIS — Z85828 Personal history of other malignant neoplasm of skin: Secondary | ICD-10-CM | POA: Insufficient documentation

## 2023-10-14 DIAGNOSIS — Z7982 Long term (current) use of aspirin: Secondary | ICD-10-CM | POA: Diagnosis not present

## 2023-10-14 DIAGNOSIS — R404 Transient alteration of awareness: Secondary | ICD-10-CM | POA: Diagnosis not present

## 2023-10-14 DIAGNOSIS — N3000 Acute cystitis without hematuria: Secondary | ICD-10-CM | POA: Insufficient documentation

## 2023-10-14 DIAGNOSIS — I2510 Atherosclerotic heart disease of native coronary artery without angina pectoris: Secondary | ICD-10-CM | POA: Insufficient documentation

## 2023-10-14 DIAGNOSIS — F0390 Unspecified dementia without behavioral disturbance: Secondary | ICD-10-CM | POA: Diagnosis not present

## 2023-10-14 DIAGNOSIS — R4182 Altered mental status, unspecified: Secondary | ICD-10-CM | POA: Diagnosis present

## 2023-10-14 LAB — COMPREHENSIVE METABOLIC PANEL
ALT: 15 U/L (ref 0–44)
AST: 16 U/L (ref 15–41)
Albumin: 3.3 g/dL — ABNORMAL LOW (ref 3.5–5.0)
Alkaline Phosphatase: 58 U/L (ref 38–126)
Anion gap: 10 (ref 5–15)
BUN: 17 mg/dL (ref 8–23)
CO2: 24 mmol/L (ref 22–32)
Calcium: 9.2 mg/dL (ref 8.9–10.3)
Chloride: 102 mmol/L (ref 98–111)
Creatinine, Ser: 1.09 mg/dL — ABNORMAL HIGH (ref 0.44–1.00)
GFR, Estimated: 52 mL/min — ABNORMAL LOW (ref >60)
Glucose, Bld: 128 mg/dL — ABNORMAL HIGH (ref 70–99)
Potassium: 4.8 mmol/L (ref 3.5–5.1)
Sodium: 136 mmol/L (ref 135–145)
Total Bilirubin: 1.3 mg/dL — ABNORMAL HIGH (ref 0.0–1.2)
Total Protein: 6.8 g/dL (ref 6.5–8.1)

## 2023-10-14 LAB — LIPASE, BLOOD: Lipase: 32 U/L (ref 11–51)

## 2023-10-14 LAB — URINALYSIS, ROUTINE W REFLEX MICROSCOPIC
Bacteria, UA: NONE SEEN
Bilirubin Urine: NEGATIVE
Glucose, UA: NEGATIVE mg/dL
Hgb urine dipstick: NEGATIVE
Ketones, ur: NEGATIVE mg/dL
Nitrite: NEGATIVE
Protein, ur: NEGATIVE mg/dL
Specific Gravity, Urine: 1.014 (ref 1.005–1.030)
WBC, UA: >50 WBC/hpf (ref 0–5)
pH: 7 (ref 5.0–8.0)

## 2023-10-14 LAB — CBC WITH DIFFERENTIAL/PLATELET
Abs Immature Granulocytes: 0.04 10*3/uL (ref 0.00–0.07)
Basophils Absolute: 0.1 10*3/uL (ref 0.0–0.1)
Basophils Relative: 1 %
Eosinophils Absolute: 0.1 10*3/uL (ref 0.0–0.5)
Eosinophils Relative: 1 %
HCT: 42.4 % (ref 36.0–46.0)
Hemoglobin: 14 g/dL (ref 12.0–15.0)
Immature Granulocytes: 0 %
Lymphocytes Relative: 16 %
Lymphs Abs: 1.4 10*3/uL (ref 0.7–4.0)
MCH: 29.7 pg (ref 26.0–34.0)
MCHC: 33 g/dL (ref 30.0–36.0)
MCV: 89.8 fL (ref 80.0–100.0)
Monocytes Absolute: 0.9 10*3/uL (ref 0.1–1.0)
Monocytes Relative: 10 %
Neutro Abs: 6.8 10*3/uL (ref 1.7–7.7)
Neutrophils Relative %: 72 %
Platelets: 282 10*3/uL (ref 150–400)
RBC: 4.72 MIL/uL (ref 3.87–5.11)
RDW: 12.6 % (ref 11.5–15.5)
WBC: 9.3 10*3/uL (ref 4.0–10.5)
nRBC: 0 % (ref 0.0–0.2)

## 2023-10-14 MED ORDER — CEPHALEXIN 500 MG PO CAPS
500.0000 mg | ORAL_CAPSULE | Freq: Three times a day (TID) | ORAL | 0 refills | Status: DC
Start: 2023-10-14 — End: 2023-10-14

## 2023-10-14 MED ORDER — SODIUM CHLORIDE 0.9 % IV SOLN
1.0000 g | Freq: Once | INTRAVENOUS | Status: AC
  Administered 2023-10-14: 1 g via INTRAVENOUS
  Filled 2023-10-14: qty 10

## 2023-10-14 MED ORDER — CEPHALEXIN 500 MG PO CAPS: 500.0000 mg | ORAL_CAPSULE | Freq: Three times a day (TID) | ORAL | 0 refills | Status: DC

## 2023-10-14 NOTE — ED Notes (Signed)
 Pt was able to ambulate to the hall and back to the bed, by herself. But was complaining of pain while walking.

## 2023-10-14 NOTE — ED Notes (Signed)
 ED Provider at bedside.

## 2023-10-14 NOTE — ED Provider Notes (Signed)
 [place] [facility] Provider Note   CSN: [id number] Arrival date & time: 10/14/23  0114     History  Chief Complaint  Patient presents with   Altered Mental Status    Patient to ED via EMS with complaint of being more confused than normal. Husband called 911 because patient was having more difficulty than normal.    Kristina Gibbs is a 78 y.o. female.  HPI     This is a 78 year old female with history of dementia who presents from home with increased altered mental status.  Per EMS report she was found by her husband in the bathroom confused.  At baseline she is fairly bit this worse.  He was having trouble getting her to respond and follow directions.  EMS reports that she took a lot of of coaxing to get off the bed and onto the EMS stretcher.  At baseline she is only oriented to herself.  They did not note any focal deficits.  She was ambulatory and able to bear weight.  Level 5 caveat for altered mental status  Home Medications Prior to Admission medications   Medication Sig Start Date End Date Taking? Authorizing Provider  cephALEXin  (KEFLEX ) 500 MG capsule Take 1 capsule (500 mg total) by mouth 3 (three) times daily. 10/14/23  Yes Sharonne Ricketts, Charmaine FALCON, MD  amLODipine  (NORVASC ) 2.5 MG tablet TAKE 1 TABLET BY MOUTH EVERY DAY 11/23/21   Court Dorn PARAS, MD  aspirin  EC 81 MG tablet Take 81 mg by mouth at bedtime.     [provider]  b complex vitamins tablet Take 1 tablet by mouth daily.     [provider]  calcium  carbonate (OS-CAL) 600 MG TABS tablet Take 1 tablet (600 mg total) by mouth daily. 12/06/20   Ricky Fines, MD  cetirizine  (ZYRTEC  ALLERGY) 10 MG tablet Take 1 tablet (10 mg total) by mouth daily. 01/26/21   Jolinda Norene HERO, DO  cholecalciferol  (VITAMIN D ) 1000 UNITS tablet Take 1,000 Units by mouth daily.    [provider]  CO ENZYME Q-10 PO Take 1 capsule by mouth 2 (two) times daily.     [provider]  doxycycline  (VIBRA -TABS) 100 MG tablet Take 1 tablet (100 mg total) by mouth 2 (two) times daily. 07/06/22   Ijaola, Onyeje M, NP  ELIQUIS  5 MG TABS tablet TAKE 1 TABLET BY MOUTH TWICE A DAY 07/22/22   Court Dorn PARAS, MD  Ferrous Gluconate (IRON 27 PO) Take by mouth. Pt isn't sure of the dosage of otc iron taken daily.    [provider]  furosemide  (LASIX ) 20 MG tablet TAKE 1 TABLET BY MOUTH EVERY DAY 10/01/22   Jolinda Norene M, DO  memantine  (NAMENDA ) 10 MG tablet Take 1 tablet (10 mg total) by mouth 2 (two) times daily. 05/07/22   Jolinda Norene HERO, DO  metoprolol  succinate (TOPROL -XL) 50 MG 24 hr tablet TAKE 1 TABLET (50 MG TOTAL) BY MOUTH IN THE MORNING AND AT BEDTIME. 12/28/21   Court Dorn PARAS, MD  nitroGLYCERIN  (NITROLINGUAL ) 0.4 MG/SPRAY spray PLACE 1 SPRAY UNDER THE TONGUE EVERY 5 (FIVE) MINUTES AS NEEDED FOR CHEST PAIN. Patient not taking: Reported on 11/09/2022 11/16/19   Jolinda Norene HERO, DO  omega-3 acid ethyl esters (LOVAZA ) 1 g capsule Take 1 capsule (1 g total) by mouth 4 (four) times daily. 05/07/22   Jolinda Norene HERO, DO  pantoprazole  (PROTONIX ) 40 MG tablet TAKE 1 TABLET BY MOUTH TWICE A DAY  10/18/22   Court Dorn PARAS, MD  PATADAY  0.2 % SOLN Place 1 drop into both eyes 2 (two) times daily. 08/22/14   [provider]  ranolazine  (RANEXA ) 500 MG 12 hr tablet TAKE 1 TABLET BY MOUTH TWICE A DAY 09/17/22   Court Dorn PARAS, MD  rosuvastatin  (CRESTOR ) 20 MG tablet TAKE 1 TABLET BY MOUTH EVERY DAY 10/22/22   Court Dorn PARAS, MD  sodium chloride  (OCEAN) 0.65 % SOLN nasal spray Place 1 spray into both nostrils as needed for congestion. 01/02/21   Ijaola, Onyeje M, NP  vitamin C  (ASCORBIC ACID ) 500 MG tablet Take 1,000 mg by mouth daily.     [provider]      Allergies    Codeine, Hydrocortisone, and Lactose intolerance (gi)    Review of Systems   Review of Systems  Unable to perform ROS: Dementia    Physical  Exam Updated Vital Signs BP 127/89   Pulse 92   Temp 98.9 F (37.2 C)   Resp 16   SpO2 98%  Physical Exam Vitals and nursing note reviewed.  Constitutional:      Appearance: She is well-developed. She is not ill-appearing.  HENT:     Head: Normocephalic and atraumatic.     Mouth/Throat:     Mouth: Mucous membranes are moist.  Eyes:     Pupils: Pupils are equal, round, and reactive to light.  Cardiovascular:     Rate and Rhythm: Normal rate and regular rhythm.     Heart sounds: Normal heart sounds.  Pulmonary:     Effort: Pulmonary effort is normal. No respiratory distress.     Breath sounds: No wheezing.  Abdominal:     General: Bowel sounds are normal.     Palpations: Abdomen is soft.     Tenderness: There is abdominal tenderness. There is no guarding or rebound.  Musculoskeletal:     Cervical back: Neck supple.  Skin:    General: Skin is warm and dry.  Neurological:     Mental Status: She is alert.     Comments: Oriented only to person  Psychiatric:     Comments: Intermittently agitated but redirectable     ED Results / Procedures / Treatments   Labs (all labs ordered are listed, but only abnormal results are displayed) Labs Reviewed  COMPREHENSIVE METABOLIC PANEL - Abnormal; Notable for the following components:      Result Value   Glucose, Bld 128 (*)    Creatinine, Ser 1.09 (*)    Albumin 3.3 (*)    Total Bilirubin 1.3 (*)    GFR, Estimated 52 (*)    All other components within normal limits  URINALYSIS, ROUTINE W REFLEX MICROSCOPIC - Abnormal; Notable for the following components:   APPearance HAZY (*)    Leukocytes,Ua LARGE (*)    Non Squamous Epithelial 0-5 (*)    All other components within normal limits  URINE CULTURE  CBC WITH DIFFERENTIAL/PLATELET  LIPASE, BLOOD    EKG EKG Interpretation Date/Time:  Friday October 14 2023 01:21:11 EST Ventricular Rate:  103 PR Interval:    QRS Duration:  108 QT Interval:  333 QTC Calculation: 436 R  Axis:   87  Text Interpretation: Atrial fibrillation Borderline right axis deviation Anteroseptal infarct, old Borderline repolarization abnormality Confirmed by Bari Pfeiffer ([id number]) on 10/14/2023 1:23:24 AM  Radiology CT Head Wo Contrast Result Date: 10/14/2023 CLINICAL DATA:  Mental status changes EXAM: CT HEAD WITHOUT CONTRAST TECHNIQUE: Contiguous axial images were obtained  from the base of the skull through the vertex without intravenous contrast. RADIATION DOSE REDUCTION: This exam was performed according to the departmental dose-optimization program which includes automated exposure control, adjustment of the mA and/or kV according to patient size and/or use of iterative reconstruction technique. COMPARISON:  None Available. FINDINGS: Brain: There is atrophy and chronic small vessel disease changes. No acute intracranial abnormality. Specifically, no hemorrhage, hydrocephalus, mass lesion, acute infarction, or significant intracranial injury. Vascular: No hyperdense vessel or unexpected calcification. Skull: No acute calvarial abnormality. Sinuses/Orbits: No acute findings Other: None IMPRESSION: Atrophy, chronic microvascular disease. No acute intracranial abnormality. Electronically Signed   By: Franky Crease M.D.   On: 10/14/2023 02:36    Procedures Procedures    Medications Ordered in ED Medications  cefTRIAXone  (ROCEPHIN ) 1 g in sodium chloride  0.9 % 100 mL IVPB (0 g Intravenous Stopped 10/14/23 0341)    ED Course/ Medical Decision Making/ A&P Clinical Course as of 10/14/23 0456  Fri Oct 14, 2023  0330 Husband at the bedside confirms that he found the patient in the bathroom.  She was standing and staring off into space.  She was not responding to him appropriately at that time.  No seizure activity noted.  He was able to get her into bed. [CH]    Clinical Course User Index [CH] Japneet Staggs, Charmaine FALCON, MD                                 Medical Decision Making Amount and/or  Complexity of Data Reviewed Labs: ordered. Radiology: ordered.  Risk Prescription drug management.   This patient presents to the ED for concern of altered mental status, this involves an extensive number of treatment options, and is a complaint that carries with it a high risk of complications and morbidity.  I considered the following differential and admission for this acute, potentially life threatening condition.  The differential diagnosis includes delirium, worsening dementia, seizure, CVA, metabolic encephalopathy, infection  MDM:    This is a 78 year old female who presents with acute worsening of mental status.  History of dementia and husband reports she has pretty bad most of the time.  However had an episode tonight where she did not respond appropriately.  It does not appear that she lost tone or had seizure-like activity but was staring and not responsive at her normal baseline.  On my evaluation she is only oriented to herself which is her baseline.  She appears to move all 4 extremities and her neurologic exam is overall without significant concerns for focal deficit.  Labs obtained.  No significant metabolic derangements.  CBC normal.  Cath urinalysis does show large leuk esterase, greater than 50 white cells.  This is a clean specimen.  She could have a UTI causing some metabolic encephalopathy.  CT scan does not show any obvious intracranial abnormality.  Given largely nonfocal exam, would have low suspicion for stroke.  Will treat UTI.  She is ambulatory at her baseline.  (Labs, imaging, consults)  Labs: I Ordered, and personally interpreted labs.  The pertinent results include: CBC, CMP, urinalysis  Imaging Studies ordered: I ordered imaging studies including CT head I independently visualized and interpreted imaging. I agree with the radiologist interpretation  Additional history obtained from EMS, husband.  External records from outside source obtained and reviewed  including prior evaluations  Cardiac Monitoring: The patient was maintained on a cardiac monitor.  If on the  cardiac monitor, I personally viewed and interpreted the cardiac monitored which showed an underlying rhythm of: Sinus  Reevaluation: After the interventions noted above, I reevaluated the patient and found that they have :improved  Social Determinants of Health:  lives at home with husband  Disposition: Discharge  Co morbidities that complicate the patient evaluation  Past Medical History:  Diagnosis Date   Allergy    Arthritis    Cancer (HCC)    ovarian   Coronary artery disease    Heart murmur    Hypercholesteremia    Hypertension    Memory impairment     MMSE 27/30   Osteopenia    Stomach problems    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    Dispense:  21 capsule    Refill:  0    I have reviewed the patients home medicines and have made adjustments as needed  Problem List / ED Course: Problem List Items Addressed This Visit   None Visit Diagnoses       Transient alteration of awareness    -  Primary     Acute cystitis without hematuria                       Final Clinical Impression(s) / ED Diagnoses Final diagnoses:  Transient alteration of awareness  Acute cystitis without hematuria    Rx / DC Orders ED Discharge Orders          Ordered    cephALEXin  (KEFLEX ) 500 MG capsule  3 times daily        10/14/23 0454              Aaro Meyers, Charmaine FALCON, MD 10/14/23 [phone]

## 2023-10-14 NOTE — Discharge Instructions (Signed)
 You were seen today for increasing confusion.  Your workup today shows a potential UTI.  Take antibiotics as prescribed.  Follow-up closely with primary physician.

## 2023-10-15 LAB — URINE CULTURE: Culture: NO GROWTH

## 2023-11-22 ENCOUNTER — Telehealth: Payer: Self-pay

## 2023-11-23 NOTE — Telephone Encounter (Signed)
PER PT DPR Spoke with pt husband he is aware that DR. G. Panel is full and she will have to see provider that is taking new pt

## 2023-11-24 ENCOUNTER — Ambulatory Visit: Payer: Medicare (Managed Care) | Admitting: Family Medicine

## 2023-11-30 ENCOUNTER — Ambulatory Visit: Payer: Medicare (Managed Care) | Admitting: Family Medicine

## 2023-12-01 ENCOUNTER — Encounter: Payer: Self-pay | Admitting: Family Medicine

## 2023-12-01 ENCOUNTER — Ambulatory Visit (INDEPENDENT_AMBULATORY_CARE_PROVIDER_SITE_OTHER): Payer: Medicare (Managed Care) | Admitting: Family Medicine

## 2023-12-01 VITALS — BP 117/56 | HR 80 | Temp 97.6°F | Ht 60.0 in | Wt 138.2 lb

## 2023-12-01 DIAGNOSIS — I4819 Other persistent atrial fibrillation: Secondary | ICD-10-CM

## 2023-12-01 DIAGNOSIS — R1310 Dysphagia, unspecified: Secondary | ICD-10-CM

## 2023-12-01 DIAGNOSIS — I10 Essential (primary) hypertension: Secondary | ICD-10-CM

## 2023-12-01 DIAGNOSIS — Z7901 Long term (current) use of anticoagulants: Secondary | ICD-10-CM | POA: Diagnosis not present

## 2023-12-01 DIAGNOSIS — G301 Alzheimer's disease with late onset: Secondary | ICD-10-CM

## 2023-12-01 DIAGNOSIS — N1831 Chronic kidney disease, stage 3a: Secondary | ICD-10-CM

## 2023-12-01 DIAGNOSIS — Z9861 Coronary angioplasty status: Secondary | ICD-10-CM

## 2023-12-01 DIAGNOSIS — R32 Unspecified urinary incontinence: Secondary | ICD-10-CM | POA: Insufficient documentation

## 2023-12-01 DIAGNOSIS — I2510 Atherosclerotic heart disease of native coronary artery without angina pectoris: Secondary | ICD-10-CM

## 2023-12-01 DIAGNOSIS — E785 Hyperlipidemia, unspecified: Secondary | ICD-10-CM

## 2023-12-01 DIAGNOSIS — D692 Other nonthrombocytopenic purpura: Secondary | ICD-10-CM | POA: Insufficient documentation

## 2023-12-01 DIAGNOSIS — R198 Other specified symptoms and signs involving the digestive system and abdomen: Secondary | ICD-10-CM

## 2023-12-01 DIAGNOSIS — R159 Full incontinence of feces: Secondary | ICD-10-CM

## 2023-12-01 DIAGNOSIS — K219 Gastro-esophageal reflux disease without esophagitis: Secondary | ICD-10-CM

## 2023-12-01 MED ORDER — AMLODIPINE BESYLATE 2.5 MG PO TABS: 2.5000 mg | ORAL_TABLET | Freq: Every day | ORAL | 3 refills | Status: DC

## 2023-12-01 MED ORDER — POLYETHYLENE GLYCOL 3350 17 GM/SCOOP PO POWD: 17.0000 g | Freq: Every day | ORAL | 1 refills | Status: AC | PRN

## 2023-12-01 MED ORDER — APIXABAN 5 MG PO TABS: 5.0000 mg | ORAL_TABLET | Freq: Two times a day (BID) | ORAL | 5 refills | Status: DC

## 2023-12-01 MED ORDER — METOPROLOL SUCCINATE ER 50 MG PO TB24: 50.0000 mg | ORAL_TABLET | Freq: Two times a day (BID) | ORAL | 2 refills | Status: DC

## 2023-12-01 MED ORDER — RANOLAZINE ER 500 MG PO TB12: 500.0000 mg | ORAL_TABLET | Freq: Two times a day (BID) | ORAL | 2 refills | Status: AC

## 2023-12-01 MED ORDER — POLYETHYLENE GLYCOL 3350 17 GM/SCOOP PO POWD: 17.0000 g | Freq: Every day | ORAL | 1 refills | Status: DC | PRN

## 2023-12-01 MED ORDER — PANTOPRAZOLE SODIUM 20 MG PO TBEC: 20.0000 mg | DELAYED_RELEASE_TABLET | Freq: Every day | ORAL | 3 refills | Status: AC

## 2023-12-01 MED ORDER — AMLODIPINE BESYLATE 2.5 MG PO TABS: 2.5000 mg | ORAL_TABLET | Freq: Every day | ORAL | 3 refills | Status: AC

## 2023-12-01 MED ORDER — RANOLAZINE ER 500 MG PO TB12: 500.0000 mg | ORAL_TABLET | Freq: Two times a day (BID) | ORAL | 2 refills | Status: DC

## 2023-12-01 MED ORDER — ROSUVASTATIN CALCIUM 20 MG PO TABS: 20.0000 mg | ORAL_TABLET | Freq: Every day | ORAL | 3 refills | Status: DC

## 2023-12-01 MED ORDER — APIXABAN 5 MG PO TABS: 5.0000 mg | ORAL_TABLET | Freq: Two times a day (BID) | ORAL | 5 refills | Status: AC

## 2023-12-01 MED ORDER — METOPROLOL SUCCINATE ER 50 MG PO TB24: 50.0000 mg | ORAL_TABLET | Freq: Two times a day (BID) | ORAL | 2 refills | Status: AC

## 2023-12-01 NOTE — Progress Notes (Signed)
 Refill failed. resent

## 2023-12-01 NOTE — Progress Notes (Signed)
 Established Patient Office Visit  Subjective   Patient ID: Kristina Gibbs, female    DOB: 1946/06/01  Age: 78 y.o. MRN: [id number]  Chief Complaint  Patient presents with   Dementia    HPI Kristina Gibbs is here with her husband and granddaughter- Mosie Epstein who is her caregiver. Kristina Gibbs is re-establishing care. She has been at [facility] but they are not happy with the care that Kristina Gibbs has received there. Kristina Gibbs has severe dementia and requires assistance with all ADLs. Needs assistance with ambulation due to confusion. Needs help getting up from toilet and down onto toilet. She has bladder incontinence, wears adult diaper. Sometimes incontinence of bowel and has to be reminded to use toilet for a BM. Alternating constipation and diarrhea for months. Also has had a hard time swallowing for several months. This has been getting worse. Trouble swallowing foods, pills. Doesn't have issues with liquids. Concerns about possible esophogeal stricture due to hx of GERD.  Hard to get her to eat. Eats soft foods: eggs, jello, applesauce. No vomiting. Occasional RLQ pain.        ROS As per HPI.    Objective:     BP (!) 117/56   Pulse 80   Temp 97.6 F (36.4 C) (Temporal)   Ht 5' (1.524 m)   Wt 138 lb 3.2 oz (62.7 kg)   SpO2 99%   BMI 26.99 kg/m    Physical Exam Vitals and nursing note reviewed.  Constitutional:      General: She is not in acute distress.    Appearance: She is not ill-appearing, toxic-appearing or diaphoretic.  Neck:     Thyroid: No thyroid mass, thyromegaly or thyroid tenderness.  Cardiovascular:     Rate and Rhythm: Normal rate and regular rhythm.     Heart sounds: Normal heart sounds. No murmur heard. Pulmonary:     Effort: Pulmonary effort is normal. No respiratory distress.     Breath sounds: Normal breath sounds. No wheezing, rhonchi or rales.  Abdominal:     General: Bowel sounds are normal. There is no distension.     Palpations: Abdomen is soft.      Tenderness: There is no abdominal tenderness. There is no right CVA tenderness, left CVA tenderness, guarding or rebound.  Musculoskeletal:     Cervical back: Neck supple. No rigidity.  Skin:    General: Skin is warm and dry.     Comments: Senile purpura present on arms bilaterally.   Neurological:     Mental Status: She is alert. Mental status is at baseline.      No results found for any visits on 12/01/23.    The 10-year ASCVD risk score (Arnett DK, et al., 2019) is: 28.6%    Assessment & Plan:   Kristina Gibbs "Kristina Gibbs" was seen today for dementia.  Diagnoses and all orders for this visit:  Severe late onset Alzheimer's dementia without behavioral disturbance, psychotic disturbance, mood disturbance, or anxiety (HCC) Requires assistance for all ADLs.   Primary hypertension BP at goal today. Continue amlodipine.  -     amLODipine (NORVASC) 2.5 MG tablet; Take 1 tablet (2.5 mg total) by mouth daily.  Persistent atrial fibrillation Chronic anticoagulation Regular rate and rhythm today.  -     apixaban (ELIQUIS) 5 MG TABS tablet; Take 1 tablet (5 mg total) by mouth 2 (two) times daily. -     metoprolol succinate (TOPROL-XL) 50 MG 24 hr tablet; Take 1 tablet (50 mg total) by mouth in the  morning and at bedtime. -     ranolazine (RANEXA) 500 MG 12 hr tablet; Take 1 tablet (500 mg total) by mouth 2 (two) times daily.  CAD S/P percutaneous coronary angioplasty -     rosuvastatin (CRESTOR) 20 MG tablet; Take 1 tablet (20 mg total) by mouth daily.  Stage 3a chronic kidney disease (HCC) Reviewed CBC, CMP from 10/14/23.  Dyslipidemia, goal LDL below 70 Not fasting today.  -     rosuvastatin (CRESTOR) 20 MG tablet; Take 1 tablet (20 mg total) by mouth daily.  Gastroesophageal reflux disease without esophagitis Continue protonix.  -     Ambulatory referral to Gastroenterology -     pantoprazole (PROTONIX) 20 MG tablet; Take 1 tablet (20 mg total) by mouth daily.  Dysphagia,  unspecified type Referral discussed and placed as below.  -     Ambulatory referral to Gastroenterology  Alternating constipation and diarrhea Can try miralax prn.  -     Ambulatory referral to Gastroenterology -     polyethylene glycol powder (GLYCOLAX/MIRALAX) 17 GM/SCOOP powder; Take 17 g by mouth daily as needed (constipation).  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 Will need incontinence supplies.   Return in about 3 months (around 02/28/2024) for chronic follow up.   The patient indicates understanding of these issues and agrees with the plan.  Gabriel Earing, FNP

## 2023-12-01 NOTE — Addendum Note (Signed)
 Addended by: Julious Payer D on: 12/01/2023 02:30 PM   Modules accepted: Orders

## 2023-12-03 ENCOUNTER — Emergency Department (HOSPITAL_COMMUNITY)
Admission: EM | Admit: 2023-12-03 | Discharge: 2023-12-03 | Disposition: A | Discharge status: Home / Self Care | Payer: Medicare (Managed Care) | Attending: Emergency Medicine | Admitting: Emergency Medicine

## 2023-12-03 ENCOUNTER — Encounter (HOSPITAL_COMMUNITY): Payer: Self-pay

## 2023-12-03 ENCOUNTER — Other Ambulatory Visit: Payer: Self-pay

## 2023-12-03 ENCOUNTER — Emergency Department (HOSPITAL_COMMUNITY): Payer: Medicare (Managed Care)

## 2023-12-03 DIAGNOSIS — S50312A Abrasion of left elbow, initial encounter: Secondary | ICD-10-CM | POA: Diagnosis not present

## 2023-12-03 DIAGNOSIS — S60212A Contusion of left wrist, initial encounter: Secondary | ICD-10-CM | POA: Insufficient documentation

## 2023-12-03 DIAGNOSIS — R079 Chest pain, unspecified: Secondary | ICD-10-CM | POA: Diagnosis present

## 2023-12-03 DIAGNOSIS — S80212A Abrasion, left knee, initial encounter: Secondary | ICD-10-CM | POA: Insufficient documentation

## 2023-12-03 DIAGNOSIS — I2510 Atherosclerotic heart disease of native coronary artery without angina pectoris: Secondary | ICD-10-CM | POA: Diagnosis not present

## 2023-12-03 DIAGNOSIS — I10 Essential (primary) hypertension: Secondary | ICD-10-CM | POA: Diagnosis not present

## 2023-12-03 DIAGNOSIS — F0390 Unspecified dementia without behavioral disturbance: Secondary | ICD-10-CM | POA: Diagnosis not present

## 2023-12-03 DIAGNOSIS — S9002XA Contusion of left ankle, initial encounter: Secondary | ICD-10-CM | POA: Insufficient documentation

## 2023-12-03 DIAGNOSIS — Y92002 Bathroom of unspecified non-institutional (private) residence single-family (private) house as the place of occurrence of the external cause: Secondary | ICD-10-CM | POA: Diagnosis not present

## 2023-12-03 DIAGNOSIS — S60211A Contusion of right wrist, initial encounter: Secondary | ICD-10-CM | POA: Insufficient documentation

## 2023-12-03 DIAGNOSIS — Z79899 Other long term (current) drug therapy: Secondary | ICD-10-CM | POA: Diagnosis not present

## 2023-12-03 DIAGNOSIS — W19XXXA Unspecified fall, initial encounter: Secondary | ICD-10-CM

## 2023-12-03 DIAGNOSIS — Z7901 Long term (current) use of anticoagulants: Secondary | ICD-10-CM | POA: Diagnosis not present

## 2023-12-03 DIAGNOSIS — Z951 Presence of aortocoronary bypass graft: Secondary | ICD-10-CM | POA: Insufficient documentation

## 2023-12-03 DIAGNOSIS — S2242XA Multiple fractures of ribs, left side, initial encounter for closed fracture: Secondary | ICD-10-CM | POA: Insufficient documentation

## 2023-12-03 DIAGNOSIS — W1830XA Fall on same level, unspecified, initial encounter: Secondary | ICD-10-CM | POA: Insufficient documentation

## 2023-12-03 MED ORDER — MORPHINE SULFATE (PF) 4 MG/ML IV SOLN
4.0000 mg | Freq: Once | INTRAVENOUS | Status: AC
  Administered 2023-12-03: 4 mg via INTRAMUSCULAR
  Filled 2023-12-03: qty 1

## 2023-12-03 MED ORDER — LIDOCAINE 5 % EX PTCH: 1.0000 | MEDICATED_PATCH | CUTANEOUS | 0 refills | Status: AC

## 2023-12-03 MED ORDER — ACETAMINOPHEN 500 MG PO TABS
1000.0000 mg | ORAL_TABLET | Freq: Once | ORAL | Status: DC
  Filled 2023-12-03: qty 2

## 2023-12-03 MED ORDER — LIDOCAINE 5 % EX PTCH
1.0000 | MEDICATED_PATCH | CUTANEOUS | Status: DC
  Administered 2023-12-03: 1 via TRANSDERMAL
  Filled 2023-12-03: qty 1

## 2023-12-03 MED ORDER — MORPHINE SULFATE 15 MG PO TABS: 15.0000 mg | ORAL_TABLET | Freq: Four times a day (QID) | ORAL | 0 refills | Status: AC | PRN

## 2023-12-03 NOTE — ED Notes (Signed)
 Attempted again to provide pain management. Pt adamantly refused interventions. During attempt at EKG, pt became agitated and attempted to grab and push away Paramedic and Water engineer.

## 2023-12-03 NOTE — ED Notes (Signed)
Pt off unit to imaging.

## 2023-12-03 NOTE — ED Triage Notes (Signed)
 Patient from home by PT[facility] following fall at home this am. No loc. Complains of rib pain.  No sob, NAD

## 2023-12-03 NOTE — ED Notes (Addendum)
 Fall precautions placed. Yellow non-slip socks, and fall bracelet.

## 2023-12-03 NOTE — Discharge Instructions (Addendum)
 You have broken your left fifth and 10th rib.  It takes approximately 6 weeks for ribs to heal.  You can place the Lidoderm patches on the area where it hurts every 12 hours.  You are also given a prescription for morphine which you can take a half to a full tablet every 6 hours as needed but also you can take 2 extra strength Tylenol every 6 hours as well for pain control.  While you are taking the morphine you need to make sure that you are taking a stool softener regularly to avoid getting constipated.  This medication can make you drowsy and more confused.  The x-ray of your head and ankle were normal with no signs of internal bleeding or broken bones today

## 2023-12-03 NOTE — ED Notes (Signed)
 Pt noted to be confused per baseline with history of dementia. This paramedic attempted several times to encourage pt to take prescribed tylenol for pain management. Pt refused several times. Pt teaching, redirection, and reorientation provided without success. Will attempt again upon return from imaging.

## 2023-12-03 NOTE — ED Provider Notes (Signed)
 [place] EMERGENCY DEPARTMENT AT [facility] Provider Note   CSN: [id number] Arrival date & time: 12/03/23  1127     History  No chief complaint on file.   Kristina Gibbs is a 78 y.o. female.  Patient is a 78 year old female with a history of hypertension, hypercholesterolemia, CAD status post CABG, dementia who lives at home with her husband and is presenting today with left-sided chest pain that has been present since she fell in the bathroom this morning.  Family is not present at this time or by phone but EMS gives the report.  She reports that the patient was in the bathroom for approximately 15 minutes and nobody witnessed the fall but seems that somebody hurt it.  Patient was able to ambulate back to the bedroom after the fall but then was complaining of chest pain.  Initially husband thought that the pain was related to her known cardiac issues and gave her nitroglycerin which did not change her pain.  There are other family members present at the house and they were concerned she may have injured herself.  They deny any change in mental status.  Patient is unable to convey significant amounts of information but does indicates she is having pain in her left chest.  She denies any neck pain or head pain.  It is unclear if she had any injury to her head and she does take Eliquis.  Also her husband had mentioned that she seemed to be having pain in her left ankle since the fall in the bathroom this morning.  She did not have prolonged downtime.  The history is provided by the EMS personnel, the patient, the spouse and medical records.       Home Medications Prior to Admission medications   Medication Sig Start Date End Date Taking? Authorizing Provider  lidocaine (LIDODERM) 5 % Place 1 patch onto the skin daily. Remove & Discard patch within 12 hours or as directed by MD 12/03/23  Yes Gwyneth Sprout, MD  morphine (MSIR) 15 MG tablet Take 1 tablet (15 mg total) by mouth  every 6 (six) hours as needed for severe pain (pain score 7-10). 12/03/23  Yes Anahla Bevis, Alphonzo Lemmings, MD  amLODipine (NORVASC) 2.5 MG tablet Take 1 tablet (2.5 mg total) by mouth daily. 12/01/23   Gabriel Earing, FNP  apixaban (ELIQUIS) 5 MG TABS tablet Take 1 tablet (5 mg total) by mouth 2 (two) times daily. 12/01/23   Gabriel Earing, FNP  b complex vitamins tablet Take 1 tablet by mouth daily.     [provider]  cholecalciferol (VITAMIN D) 1000 UNITS tablet Take 1,000 Units by mouth daily.    [provider]  CO ENZYME Q-10 PO Take 1 capsule by mouth 2 (two) times daily.    [provider]  metoprolol succinate (TOPROL-XL) 50 MG 24 hr tablet Take 1 tablet (50 mg total) by mouth in the morning and at bedtime. 12/01/23   Gabriel Earing, FNP  pantoprazole (PROTONIX) 20 MG tablet Take 1 tablet (20 mg total) by mouth daily. 12/01/23   Gabriel Earing, FNP  polyethylene glycol powder (GLYCOLAX/MIRALAX) 17 GM/SCOOP powder Take 17 g by mouth daily as needed (constipation). 12/01/23   Gabriel Earing, FNP  ranolazine (RANEXA) 500 MG 12 hr tablet Take 1 tablet (500 mg total) by mouth 2 (two) times daily. 12/01/23   Gabriel Earing, FNP  rosuvastatin (CRESTOR) 20 MG tablet Take 1 tablet (20 mg total) by  mouth daily. 12/01/23   Gabriel Earing, FNP      Allergies    Codeine, Hydrocortisone, and Lactose intolerance (gi)    Review of Systems   Review of Systems  Physical Exam Updated Vital Signs There were no vitals taken for this visit. Physical Exam Vitals and nursing note reviewed.  Constitutional:      General: She is not in acute distress.    Appearance: She is well-developed.  HENT:     Head: Normocephalic and atraumatic.  Eyes:     Pupils: Pupils are equal, round, and reactive to light.  Cardiovascular:     Rate and Rhythm: Normal rate and regular rhythm.     Heart sounds: Normal heart sounds. No murmur heard.    No friction rub.  Pulmonary:     Effort:  Pulmonary effort is normal.     Breath sounds: Normal breath sounds. No wheezing or rales.  Chest:     Chest wall: Tenderness present.  Breasts:    Left: Tenderness present.    Abdominal:     General: Bowel sounds are normal. There is no distension.     Palpations: Abdomen is soft.     Tenderness: There is no abdominal tenderness. There is no guarding or rebound.  Musculoskeletal:        General: Tenderness present. Normal range of motion.       Arms:     Cervical back: Normal range of motion and neck supple. No tenderness.     Right lower leg: No edema.     Left lower leg: No edema.     Left ankle: Ecchymosis present. Tenderness present over the medial malleolus.       Legs:     Comments: No edema.  Able to range bilateral hips and shoulders with no abnormalities.  Skin:    General: Skin is warm and dry.     Findings: No rash.  Neurological:     Mental Status: She is alert and oriented to person, place, and time.     Cranial Nerves: No cranial nerve deficit.  Psychiatric:        Behavior: Behavior normal.     ED Results / Procedures / Treatments   Labs (all labs ordered are listed, but only abnormal results are displayed) Labs Reviewed - No data to display  EKG EKG Interpretation Date/Time:  Saturday December 03 2023 12:12:31 EST Ventricular Rate:  99 PR Interval:    QRS Duration:  82 QT Interval:  304 QTC Calculation: 390 R Axis:   82  Text Interpretation: Atrial fibrillation Septal infarct , age undetermined No significant change since last tracing When compared with ECG of 14-Oct-2023 01:21, PREVIOUS ECG IS PRESENT Confirmed by Gwyneth Sprout ([id number]) on 12/03/2023 12:17:04 PM  Radiology DG Ribs Unilateral W/Chest Left Result Date: 12/03/2023 CLINICAL DATA:  Fall, pain EXAM: LEFT RIBS AND CHEST - 3+ VIEW COMPARISON:  None Available. FINDINGS: Minimally displaced fractures of the lateral left fifth and tenth ribs. There is no evidence of pneumothorax or pleural  effusion. Cardiomegaly status post median sternotomy and CABG. Heart size and mediastinal contours are within normal limits. IMPRESSION: 1. Minimally displaced fractures of the lateral left fifth and tenth ribs. No pneumothorax or pleural effusion. 2. Cardiomegaly status post median sternotomy and CABG. Electronically Signed   By: Jearld Lesch M.D.   On: 12/03/2023 14:08   DG Ankle Complete Left Result Date: 12/03/2023 CLINICAL DATA:  Fall with ankle pain. EXAM: LEFT ANKLE COMPLETE -  3+ VIEW COMPARISON:  Foot radiographs dated 04/04/2018. FINDINGS: There is no evidence of fracture, dislocation, or joint effusion. There are tiny plantar and posterior calcaneal enthesophytes. The soft tissues are unremarkable. IMPRESSION: No acute osseous injury. Electronically Signed   By: Romona Curls M.D.   On: 12/03/2023 14:06   CT Head Wo Contrast Result Date: 12/03/2023 CLINICAL DATA:  Altered mental status, confusion. EXAM: CT HEAD WITHOUT CONTRAST TECHNIQUE: Contiguous axial images were obtained from the base of the skull through the vertex without intravenous contrast. RADIATION DOSE REDUCTION: This exam was performed according to the departmental dose-optimization program which includes automated exposure control, adjustment of the mA and/or kV according to patient size and/or use of iterative reconstruction technique. COMPARISON:  CT head dated 10/14/2023. FINDINGS: Brain: No evidence of acute infarction, hemorrhage, hydrocephalus, extra-axial collection or mass lesion/mass effect. There is mild cerebral volume loss with associated ex vacuo dilatation. Periventricular white matter hypoattenuation likely represents chronic small vessel ischemic disease. Vascular: There are vascular calcifications in the carotid siphons. Skull: Normal. Negative for fracture or focal lesion. Sinuses/Orbits: There is bilateral ethmoid sinus disease. Other: None. IMPRESSION: No acute intracranial process. Electronically Signed   By: Romona Curls M.D.   On: 12/03/2023 12:32    Procedures Procedures    Medications Ordered in ED Medications  acetaminophen (TYLENOL) tablet 1,000 mg (1,000 mg Oral Patient Refused/Not Given 12/03/23 1256)  morphine (PF) 4 MG/ML injection 4 mg (has no administration in time range)  lidocaine (LIDODERM) 5 % 1 patch (has no administration in time range)    ED Course/ Medical Decision Making/ A&P                                 Medical Decision Making Amount and/or Complexity of Data Reviewed Radiology: ordered and independent interpretation performed. Decision-making details documented in ED Course. ECG/medicine tests: ordered and independent interpretation performed. Decision-making details documented in ED Course.  Risk OTC drugs. Prescription drug management.   Pt with multiple medical problems and comorbidities and presenting today with a complaint that caries a high risk for morbidity and mortality.  Here today after a fall in the bathroom.  There was no report of patient having loss of consciousness and it was thought that this is most likely mechanical in nature.  Patient does appear to have significant pain in the left side of the chest.  She has no difficulty breathing at this time and breath sounds are equal bilaterally with low suspicion for pneumothorax.  Does take Eliquis and since the fall was unwitnessed and she is unable to communicate if she hit her head will do imaging to ensure no evidence of acute intercranial bleed.  No obvious signs of trauma.  Patient has no pain in her neck with palpation and will move it around and has no discomfort.  She is moving all extremities without difficulty.  I have independently visualized and interpreted pt's images today.  Chest x-ray with left rib fractures.  Radiology reports left fifth and 10th rib fractures.  No evidence of pneumothorax at this time.  Ankle images negative.  Head CT without intracranial injury.  Radiology reports no acute  process.  I dependently interpreted patient's EKG which shows no acute findings and chronic atrial fibrillation.  Patient's husband Peyton Najjar is now present at bedside and discussed the findings with him.  Patient is refusing to take any oral medications due to being agitated at  this time.  Will give IM morphine for pain and a Lidoderm patch.  Will discharge patient home with pain control.  Her husband is comfortable with this plan.  Discussed side effects of opiates and patient with her underlying medical issues and age.  Also discussed regular stool softeners to avoid constipation.  They were given return precautions.          Final Clinical Impression(s) / ED Diagnoses Final diagnoses:  Closed fracture of multiple ribs of left side, initial encounter  Fall, initial encounter    Rx / DC Orders ED Discharge Orders          Ordered    morphine (MSIR) 15 MG tablet  Every 6 hours PRN        12/03/23 1423    lidocaine (LIDODERM) 5 %  Every 24 hours        12/03/23 1423              Gwyneth Sprout, MD 12/03/23 1425

## 2023-12-03 NOTE — ED Notes (Signed)
 Pt returned from imaging.

## 2023-12-03 NOTE — ED Notes (Signed)
 BED ALARM pad placed under pt and turned ON with GREEN light flashing.

## 2023-12-03 NOTE — ED Notes (Signed)
 Husband at bedside.   Husband attempted to coach pt to take tylenol for pain management, once again unsuccessful. Pt refused. Documentation as noted.

## 2023-12-04 ENCOUNTER — Telehealth: Payer: Self-pay

## 2023-12-04 NOTE — Telephone Encounter (Signed)
 Patients husband called in to say that they went to the pharmacy to pick up the prescriptions and they were not there. Messaged provider Dr Anitra Lauth to resend the perscriptions

## 2023-12-15 ENCOUNTER — Inpatient Hospital Stay: Payer: Medicare (Managed Care) | Admitting: Family Medicine

## 2024-02-20 ENCOUNTER — Emergency Department (HOSPITAL_BASED_OUTPATIENT_CLINIC_OR_DEPARTMENT_OTHER)
Admission: EM | Admit: 2024-02-20 | Discharge: 2024-02-20 | Disposition: A | Discharge status: Home / Self Care | Payer: Medicare (Managed Care) | Attending: Emergency Medicine | Admitting: Emergency Medicine

## 2024-02-20 ENCOUNTER — Other Ambulatory Visit (HOSPITAL_BASED_OUTPATIENT_CLINIC_OR_DEPARTMENT_OTHER): Payer: Self-pay

## 2024-02-20 ENCOUNTER — Other Ambulatory Visit: Payer: Self-pay

## 2024-02-20 DIAGNOSIS — L089 Local infection of the skin and subcutaneous tissue, unspecified: Secondary | ICD-10-CM

## 2024-02-20 DIAGNOSIS — F0390 Unspecified dementia without behavioral disturbance: Secondary | ICD-10-CM | POA: Diagnosis not present

## 2024-02-20 DIAGNOSIS — I2510 Atherosclerotic heart disease of native coronary artery without angina pectoris: Secondary | ICD-10-CM | POA: Diagnosis not present

## 2024-02-20 DIAGNOSIS — I10 Essential (primary) hypertension: Secondary | ICD-10-CM | POA: Insufficient documentation

## 2024-02-20 DIAGNOSIS — Z8543 Personal history of malignant neoplasm of ovary: Secondary | ICD-10-CM | POA: Diagnosis not present

## 2024-02-20 DIAGNOSIS — L03114 Cellulitis of left upper limb: Secondary | ICD-10-CM | POA: Insufficient documentation

## 2024-02-20 DIAGNOSIS — S6992XA Unspecified injury of left wrist, hand and finger(s), initial encounter: Secondary | ICD-10-CM | POA: Diagnosis present

## 2024-02-20 DIAGNOSIS — Z79899 Other long term (current) drug therapy: Secondary | ICD-10-CM | POA: Insufficient documentation

## 2024-02-20 DIAGNOSIS — Z7901 Long term (current) use of anticoagulants: Secondary | ICD-10-CM | POA: Diagnosis not present

## 2024-02-20 DIAGNOSIS — X58XXXA Exposure to other specified factors, initial encounter: Secondary | ICD-10-CM | POA: Insufficient documentation

## 2024-02-20 DIAGNOSIS — S61412A Laceration without foreign body of left hand, initial encounter: Secondary | ICD-10-CM | POA: Diagnosis not present

## 2024-02-20 DIAGNOSIS — Z85828 Personal history of other malignant neoplasm of skin: Secondary | ICD-10-CM | POA: Insufficient documentation

## 2024-02-20 MED ORDER — BACITRACIN ZINC 500 UNIT/GM EX OINT
TOPICAL_OINTMENT | Freq: Once | CUTANEOUS | Status: AC
Start: 2024-02-20 — End: 2024-02-20
  Filled 2024-02-20: qty 28.35

## 2024-02-20 MED ORDER — BACITRACIN ZINC 500 UNIT/GM EX OINT: 1.0000 | TOPICAL_OINTMENT | Freq: Two times a day (BID) | CUTANEOUS | 0 refills | Status: DC

## 2024-02-20 MED ORDER — AMOXICILLIN-POT CLAVULANATE 875-125 MG PO TABS: 1.0000 | ORAL_TABLET | Freq: Two times a day (BID) | ORAL | 0 refills | Status: DC

## 2024-02-20 MED ORDER — BACITRACIN 500 UNIT/GM EX OINT
1.0000 | TOPICAL_OINTMENT | Freq: Two times a day (BID) | CUTANEOUS | 0 refills | Status: AC
  Filled 2024-02-20: qty 113.6, 30d supply, fill #0
  Filled 2024-02-20: qty 56, 14d supply, fill #0

## 2024-02-20 MED ORDER — AMOXICILLIN-POT CLAVULANATE 875-125 MG PO TABS
1.0000 | ORAL_TABLET | Freq: Two times a day (BID) | ORAL | 0 refills | Status: AC
  Filled 2024-02-20: qty 14, 7d supply, fill #0

## 2024-02-20 MED ORDER — AMOXICILLIN-POT CLAVULANATE 875-125 MG PO TABS
1.0000 | ORAL_TABLET | Freq: Once | ORAL | Status: AC
  Administered 2024-02-20: 1 via ORAL
  Filled 2024-02-20: qty 1

## 2024-02-20 NOTE — ED Notes (Signed)
 Unable to obtain blood pressure due to patient's confusion , dementia , combative and uncooperative . Staff attempted multiple times with no success .

## 2024-02-20 NOTE — ED Provider Notes (Signed)
 [place] [facility] AT [facility] Provider Note   CSN: [id number] Arrival date & time: 02/20/24  1008     History  Chief Complaint  Patient presents with   Recurrent Skin Infections    Kristina Gibbs is a 78 y.o. female.  Pt is a 78 yo female with pmhx significant for dementia, hld, cad, htn, ovarian cancer, skin cancer.  Pt is brought in by her husband for possible infection to her left hand. She is unable to contribute to hx.  Pt is at [facility] adult daycare during the week days.  She sustained a skin tear to the dorsum of the left hand on 5/16.  Her husband noticed it was red today, so brought her in.         Home Medications Prior to Admission medications   Medication Sig Start Date End Date Taking? Authorizing Provider  amLODipine  (NORVASC ) 2.5 MG tablet Take 1 tablet (2.5 mg total) by mouth daily. 12/01/23   Albertha Huger, FNP  amoxicillin -clavulanate (AUGMENTIN ) 875-125 MG tablet Take 1 tablet by mouth every 12 (twelve) hours. 02/20/24   Sueellen Emery, MD  apixaban  (ELIQUIS ) 5 MG TABS tablet Take 1 tablet (5 mg total) by mouth 2 (two) times daily. 12/01/23   Albertha Huger, FNP  b complex vitamins tablet Take 1 tablet by mouth daily.     [provider]  bacitracin  500 UNIT/GM ointment Apply 1 Application topically 2 (two) times daily. 02/20/24   Sueellen Emery, MD  cholecalciferol  (VITAMIN D ) 1000 UNITS tablet Take 1,000 Units by mouth daily.    [provider]  CO ENZYME Q-10 PO Take 1 capsule by mouth 2 (two) times daily.    [provider]  lidocaine  (LIDODERM ) 5 % Place 1 patch onto the skin daily. Remove & Discard patch within 12 hours or as directed by MD 12/03/23   Almond Army, MD  metoprolol  succinate (TOPROL -XL) 50 MG 24 hr tablet Take 1 tablet (50 mg total) by mouth in the morning and at bedtime. 12/01/23   Albertha Huger, FNP  morphine  (MSIR) 15 MG tablet Take 1 tablet (15 mg total) by mouth every 6  (six) hours as needed for severe pain (pain score 7-10). 12/03/23   Almond Army, MD  pantoprazole  (PROTONIX ) 20 MG tablet Take 1 tablet (20 mg total) by mouth daily. 12/01/23   Albertha Huger, FNP  polyethylene glycol powder (GLYCOLAX /MIRALAX ) 17 GM/SCOOP powder Take 17 g by mouth daily as needed (constipation). 12/01/23   Albertha Huger, FNP  ranolazine  (RANEXA ) 500 MG 12 hr tablet Take 1 tablet (500 mg total) by mouth 2 (two) times daily. 12/01/23   Albertha Huger, FNP  rosuvastatin  (CRESTOR ) 20 MG tablet Take 1 tablet (20 mg total) by mouth daily. 12/01/23   Albertha Huger, FNP      Allergies    Codeine, Hydrocortisone, and Lactose intolerance (gi)    Review of Systems   Review of Systems  Skin:  Positive for color change and wound.  All other systems reviewed and are negative.   Physical Exam Updated Vital Signs Pulse 79   Temp (!) 97.5 F (36.4 C)   Resp 20   Wt 63.5 kg   SpO2 95%   BMI 27.34 kg/m  Physical Exam Vitals and nursing note reviewed.  Constitutional:      Appearance: Normal appearance.  HENT:     Head: Normocephalic and atraumatic.     Right Ear: External ear  normal.     Left Ear: External ear normal.     Nose: Nose normal.     Mouth/Throat:     Mouth: Mucous membranes are moist.     Pharynx: Oropharynx is clear.  Eyes:     Extraocular Movements: Extraocular movements intact.     Pupils: Pupils are equal, round, and reactive to light.  Cardiovascular:     Rate and Rhythm: Normal rate and regular rhythm.     Pulses: Normal pulses.     Heart sounds: Normal heart sounds.  Pulmonary:     Effort: Pulmonary effort is normal.     Breath sounds: Normal breath sounds.  Abdominal:     General: Abdomen is flat. Bowel sounds are normal.     Palpations: Abdomen is soft.  Musculoskeletal:        General: Normal range of motion.     Cervical back: Normal range of motion and neck supple.  Skin:    Capillary Refill: Capillary refill takes less than 2  seconds.     Comments: Skin tear to dorsum of left hand.  She has surrounding cellulitis to the hand.    Neurological:     Mental Status: She is alert. Mental status is at baseline. She is disoriented.  Psychiatric:        Behavior: Behavior is agitated.     ED Results / Procedures / Treatments   Labs (all labs ordered are listed, but only abnormal results are displayed) Labs Reviewed - No data to display  EKG None  Radiology No results found.  Procedures Procedures    Medications Ordered in ED Medications  bacitracin  ointment ( Topical Given 02/20/24 1118)  amoxicillin -clavulanate (AUGMENTIN ) 875-125 MG per tablet 1 tablet (1 tablet Oral Given 02/20/24 1104)    ED Course/ Medical Decision Making/ A&P                                 Medical Decision Making Risk OTC drugs. Prescription drug management.   This patient presents to the ED for concern of redness to hand, this involves an extensive number of treatment options, and is a complaint that carries with it a high risk of complications and morbidity.  The differential diagnosis includes cellulitis, infection   Co morbidities that complicate the patient evaluation  dementia, hld, cad, htn, ovarian cancer, skin cancer   Additional history obtained:  Additional history obtained from epic chart review External records from outside source obtained and reviewed including husband   Medicines ordered and prescription drug management:  I ordered medication including augmentin   for infection  Reevaluation of the patient after these medicines showed that the patient improved I have reviewed the patients home medicines and have made adjustments as needed   Problem List / ED Course:  Left hand skin tear with surrounding cellulitis:  wound cleaned.  Dressing changed.  Husband told to change dsg bid and apply bacitracin .  Keep wound clean.  She is also d/c with augmentin .  She is to return if worse.  F/u with  pcp.   Reevaluation:  After the interventions noted above, I reevaluated the patient and found that they have :improved   Social Determinants of Health:  Lives at home   Dispostion:  After consideration of the diagnostic results and the patients response to treatment, I feel that the patent would benefit from discharge with outpatient f/u.  Final Clinical Impression(s) / ED Diagnoses Final diagnoses:  Infected skin tear    Rx / DC Orders ED Discharge Orders          Ordered    amoxicillin -clavulanate (AUGMENTIN ) 875-125 MG tablet  Every 12 hours,   Status:  Discontinued        02/20/24 1048    bacitracin  ointment  2 times daily,   Status:  Discontinued        02/20/24 1052    amoxicillin -clavulanate (AUGMENTIN ) 875-125 MG tablet  Every 12 hours        02/20/24 1105    bacitracin  500 UNIT/GM ointment  2 times daily        02/20/24 1105              Sueellen Emery, MD 02/20/24 1240

## 2024-02-20 NOTE — ED Notes (Signed)
 Wound care given to pt. Cleansed infected area with soap and water, placed bacitracin  ointment, and wrapped with kerlix and coban. Pt had additional skin tear that was assessed on the right wrist. This area was not infected. Wound recovered.   Anastacio Balm, RN

## 2024-02-20 NOTE — ED Triage Notes (Signed)
 Extremely confused . Hx dementia . Obvious infection to left hand . Drainage , swelling . Presents with dressing covering her wrist .

## 2024-02-20 NOTE — Discharge Instructions (Addendum)
 Apply antibiotic ointment twice a day to hand.  Keep wound clean with soap and water.  Change dressing twice a day.

## 2024-02-29 ENCOUNTER — Ambulatory Visit: Payer: Medicare (Managed Care) | Admitting: Family Medicine

## 2024-07-16 ENCOUNTER — Encounter (HOSPITAL_COMMUNITY): Payer: Self-pay

## 2024-07-16 ENCOUNTER — Emergency Department (HOSPITAL_COMMUNITY)
Admission: EM | Admit: 2024-07-16 | Discharge: 2024-07-16 | Disposition: A | Discharge status: Skilled Nursing Facility | Payer: Medicare (Managed Care) | Attending: Emergency Medicine | Admitting: Emergency Medicine

## 2024-07-16 ENCOUNTER — Emergency Department (HOSPITAL_COMMUNITY): Payer: Medicare (Managed Care)

## 2024-07-16 ENCOUNTER — Other Ambulatory Visit: Payer: Self-pay

## 2024-07-16 DIAGNOSIS — I2510 Atherosclerotic heart disease of native coronary artery without angina pectoris: Secondary | ICD-10-CM | POA: Insufficient documentation

## 2024-07-16 DIAGNOSIS — Z79899 Other long term (current) drug therapy: Secondary | ICD-10-CM | POA: Diagnosis not present

## 2024-07-16 DIAGNOSIS — F0390 Unspecified dementia without behavioral disturbance: Secondary | ICD-10-CM | POA: Diagnosis not present

## 2024-07-16 DIAGNOSIS — S0511XA Contusion of eyeball and orbital tissues, right eye, initial encounter: Secondary | ICD-10-CM

## 2024-07-16 DIAGNOSIS — I482 Chronic atrial fibrillation, unspecified: Secondary | ICD-10-CM | POA: Diagnosis not present

## 2024-07-16 DIAGNOSIS — W19XXXA Unspecified fall, initial encounter: Secondary | ICD-10-CM | POA: Insufficient documentation

## 2024-07-16 DIAGNOSIS — S01111A Laceration without foreign body of right eyelid and periocular area, initial encounter: Secondary | ICD-10-CM | POA: Diagnosis present

## 2024-07-16 DIAGNOSIS — Z7901 Long term (current) use of anticoagulants: Secondary | ICD-10-CM | POA: Diagnosis not present

## 2024-07-16 DIAGNOSIS — Y92129 Unspecified place in nursing home as the place of occurrence of the external cause: Secondary | ICD-10-CM | POA: Insufficient documentation

## 2024-07-16 DIAGNOSIS — Z8543 Personal history of malignant neoplasm of ovary: Secondary | ICD-10-CM | POA: Insufficient documentation

## 2024-07-16 NOTE — Discharge Instructions (Signed)
 Tissue adhesive will fall off in approximately 7 days.

## 2024-07-16 NOTE — ED Provider Notes (Signed)
 Patient signed out by Dr. Raford.  78 year old with dementia here after fall.  She is awaiting transport back to her facility. Physical Exam  BP 133/72 (BP Location: Right Arm)   Pulse 96   Temp 97.8 F (36.6 C) (Oral)   Resp 18   Ht 5' 3 (1.6 m)   Wt 59.2 kg   SpO2 98%   BMI 23.12 kg/m   Physical Exam  Procedures  Procedures  ED Course / MDM    Medical Decision Making Amount and/or Complexity of Data Reviewed Radiology: ordered.          Kristina Ozell BROCKS, MD 07/16/24 0730

## 2024-07-16 NOTE — ED Notes (Signed)
 Patient transported to CT

## 2024-07-16 NOTE — ED Triage Notes (Signed)
 Pt found in the floor by staff at [facility]. Has a small laceration to the right eye, is on blood thinners. [facility] it happened less than an hour ago because they do hourly rounding on alzheimer patients.

## 2024-07-16 NOTE — ED Provider Notes (Signed)
 [place] EMERGENCY DEPARTMENT AT [facility] Provider Note   CSN: [id number] Arrival date & time: 07/16/24  9563     Patient presents with: Kristina Gibbs is a 78 y.o. female.   The history is provided by the nursing home. The history is limited by the condition of the patient (Dementia).  Fall   She has history of hyperlipidemia, coronary artery disease, ovarian cancer, paroxysmal atrial fibrillation anticoagulated on apixaban , dementia and was sent from skilled nursing facility because of an unwitnessed fall.  She suffered a laceration above her right eye.  No other injury was noted.    Prior to Admission medications   Medication Sig Start Date End Date Taking? Authorizing Provider  amLODipine  (NORVASC ) 2.5 MG tablet Take 1 tablet (2.5 mg total) by mouth daily. 12/01/23   Joesph Annabella HERO, FNP  amoxicillin -clavulanate (AUGMENTIN ) 875-125 MG tablet Take 1 tablet by mouth every 12 (twelve) hours. 02/20/24   Dean Clarity, MD  apixaban  (ELIQUIS ) 5 MG TABS tablet Take 1 tablet (5 mg total) by mouth 2 (two) times daily. 12/01/23   Joesph Annabella HERO, FNP  b complex vitamins tablet Take 1 tablet by mouth daily.     [provider]  bacitracin  500 UNIT/GM ointment Apply 1 Application topically 2 (two) times daily. 02/20/24   Dean Clarity, MD  cholecalciferol  (VITAMIN D ) 1000 UNITS tablet Take 1,000 Units by mouth daily.    [provider]  CO ENZYME Q-10 PO Take 1 capsule by mouth 2 (two) times daily.    [provider]  lidocaine  (LIDODERM ) 5 % Place 1 patch onto the skin daily. Remove & Discard patch within 12 hours or as directed by MD 12/03/23   Doretha Folks, MD  metoprolol  succinate (TOPROL -XL) 50 MG 24 hr tablet Take 1 tablet (50 mg total) by mouth in the morning and at bedtime. 12/01/23   Joesph Annabella HERO, FNP  morphine  (MSIR) 15 MG tablet Take 1 tablet (15 mg total) by mouth every 6 (six) hours as needed for severe pain (pain  score 7-10). 12/03/23   Doretha Folks, MD  pantoprazole  (PROTONIX ) 20 MG tablet Take 1 tablet (20 mg total) by mouth daily. 12/01/23   Joesph Annabella HERO, FNP  polyethylene glycol powder (GLYCOLAX /MIRALAX ) 17 GM/SCOOP powder Take 17 g by mouth daily as needed (constipation). 12/01/23   Joesph Annabella HERO, FNP  ranolazine  (RANEXA ) 500 MG 12 hr tablet Take 1 tablet (500 mg total) by mouth 2 (two) times daily. 12/01/23   Joesph Annabella HERO, FNP  rosuvastatin  (CRESTOR ) 20 MG tablet Take 1 tablet (20 mg total) by mouth daily. 12/01/23   Joesph Annabella HERO, FNP    Allergies: Codeine, Hydrocortisone, and Lactose intolerance (gi)    Review of Systems  Unable to perform ROS: Dementia    Updated Vital Signs BP 133/72 (BP Location: Right Arm)   Pulse 96   Temp 97.8 F (36.6 C) (Oral)   Resp 18   Ht 5' 3 (1.6 m)   Wt 59.2 kg   SpO2 98%   BMI 23.12 kg/m   Physical Exam Vitals and nursing note reviewed.   78 year old female, resting comfortably and in no acute distress. Vital signs are normal. Oxygen saturation is 98%, which is normal. Head is normocephalic.  There is ecchymosis and mild small tissue swelling superior and lateral to the right eye with small laceration present.  There is no step-off of the orbital rim. PERRLA, EOMI. Oropharynx is clear.  Neck is nontender. Back is nontender. Lungs are clear without rales, wheezes, or rhonchi. Chest is nontender. Heart has regular rate and rhythm without murmur. Abdomen is soft, flat, nontender. Extremities have no cyanosis or edema, full range of motion is present without pain. Skin is warm and dry without rash. Neurologic: Awake and alert but not oriented.  Moves all extremities equally.    Radiology: CT Cervical Spine Wo Contrast Result Date: 07/16/2024 EXAM: CT CERVICAL SPINE WITHOUT CONTRAST 07/16/2024 05:19:02 AM TECHNIQUE: CT of the cervical spine was performed without the administration of intravenous contrast. Multiplanar reformatted  images are provided for review. Automated exposure control, iterative reconstruction, and/or weight based adjustment of the mA/kV was utilized to reduce the radiation dose to as low as reasonably achievable. COMPARISON: Head CT today reported separately. CLINICAL HISTORY: 78 year old female with neck trauma after a fall, found on floor, on blood thinners, small right eye laceration. FINDINGS: CERVICAL SPINE: BONES AND ALIGNMENT: Straightening of cervical lordosis. Multilevel mild degenerative appearing spondylolisthesis. Advanced chronic anterior C1 odontoid degeneration with sclerosis, subchondral cyst, and osteophytosis. No acute fracture or traumatic malalignment. DEGENERATIVE CHANGES: Advanced chronic disc and endplate degeneration in the mid cervical spine. Mild multifactorial spinal stenosis is possible at C4-C5. SOFT TISSUES: Partially retropharyngeal course of both carotid arteries with bulky calcified carotid atherosclerosis greater on the left. No prevertebral soft tissue swelling. LUNGS: Apical lung scarring and emphysema. IMPRESSION: 1. No acute traumatic injury identified in the cervical spine. 2. Chronic cervical degeneration with multilevel mild spondylolisthesis. Possible mild spinal stenosis at C4-C5. Electronically signed by: Helayne Hurst MD 07/16/2024 05:50 AM EDT RP Workstation: [id number]   CT Head Wo Contrast Result Date: 07/16/2024 EXAM: CT HEAD WITHOUT CONTRAST 07/16/2024 05:19:02 AM TECHNIQUE: CT of the head was performed without the administration of intravenous contrast. Automated exposure control, iterative reconstruction, and/or weight based adjustment of the mA/kV was utilized to reduce the radiation dose to as low as reasonably achievable. COMPARISON: Brain MRI 12/07/2005. Head CT 12/03/2023. CLINICAL HISTORY: 78 year old female with minor head trauma, found on floor, right eye laceration, on blood thinners. FINDINGS: BRAIN AND VENTRICLES: No acute hemorrhage. No evidence of acute  infarct. No hydrocephalus. No extra-axial collection. No mass effect or midline shift. Stable cerebral volume. Evidence of chronic mesial temporal lobe atrophy (coronal image 36). Patchy moderate for age periventricular white matter hypodensity is stable. No suspicious intracranial vascular hyperdensity. Calcified atherosclerosis at the skull base. ORBITS: Mild motion artifact at both orbits. SINUSES: No acute abnormality. SOFT TISSUES AND SKULL: No acute soft tissue abnormality. No skull fracture. IMPRESSION: 1. No no acute traumatic injury identified. 2. No acute intracranial abnormality. Chronic mesial temporal lobe atrophy. Chronic white matter disease. Electronically signed by: Helayne Hurst MD 07/16/2024 05:46 AM EDT RP Workstation: [id number]     .Laceration Repair  Date/Time: 07/16/2024 4:52 AM  Performed by: Raford Lenis, MD Authorized by: Raford Lenis, MD   Consent:    Consent obtained: Implied consent.   Consent given by: Implied consent.   Risks discussed:  Infection, poor cosmetic result, pain and poor wound healing Universal protocol:    Relevant documents present and verified: yes     Test results available: yes     Imaging studies available: yes     Required blood products, implants, devices, and special equipment available: yes     Site/side marked: yes     Immediately prior to procedure, a time out was called: yes     Patient identity confirmed:  Hospital-assigned identification number and  arm band Anesthesia:    Anesthesia method:  None Laceration details:    Location:  Face   Face location:  R eyebrow   Length (cm):  1   Depth (mm):  2 Pre-procedure details:    Preparation:  Patient was prepped and draped in usual sterile fashion and imaging obtained to evaluate for foreign bodies Exploration:    Limited defect created (wound extended): no     Hemostasis achieved with:  Direct pressure   Imaging obtained: x-ray     Imaging outcome: foreign body not noted      Wound exploration: entire depth of wound visualized     Wound extent: no foreign body and no underlying fracture     Contaminated: no   Treatment:    Area cleansed with:  Saline   Amount of cleaning:  Standard   Debridement:  None   Undermining:  None   Scar revision: no   Skin repair:    Repair method:  Tissue adhesive Approximation:    Approximation:  Close Repair type:    Repair type:  Simple Post-procedure details:    Dressing:  Open (no dressing)    Medications Ordered in the ED - No data to display                                  Medical Decision Making Amount and/or Complexity of Data Reviewed Radiology: ordered.   Unwitnessed fall and patient was anticoagulated on apixaban .  At the trauma center, this would qualify as a level 2 trauma.  She has a laceration superior and lateral to her right eye with ecchymosis and soft tissue swelling over the same area, no other evidence of trauma seen.  I reviewed her past records, and note tetanus booster was administered on 01/04/2022, no need for tetanus immunization today.  I have ordered CT of head and cervical spine.  I have closed the laceration with tissue adhesive.  CT scans show no evidence of acute traumatic injury.  I have independently viewed the images, and agree with the radiologist's interpretation.  I am discharging her back to her skilled nursing facility.   CRITICAL CARE Performed by: Alm Lias Total critical care time: 40 minutes Critical care time was exclusive of separately billable procedures and treating other patients. Critical care was necessary to treat or prevent imminent or life-threatening deterioration. Critical care was time spent personally by me on the following activities: development of treatment plan with patient and/or surrogate as well as nursing, discussions with consultants, evaluation of patient's response to treatment, examination of patient, obtaining history from patient or surrogate,  ordering and performing treatments and interventions, ordering and review of laboratory studies, ordering and review of radiographic studies, pulse oximetry and re-evaluation of patient's condition.    Final diagnoses:  Fall at nursing home, initial encounter  Laceration of right eyebrow, initial encounter  Periorbital contusion, right, initial encounter  Chronic anticoagulation    ED Discharge Orders     None          Lias Alm, MD 07/16/24 [id number]

## 2024-07-17 ENCOUNTER — Ambulatory Visit: Payer: Medicare (Managed Care) | Admitting: Cardiovascular Disease

## 2024-08-01 ENCOUNTER — Ambulatory Visit: Payer: Medicare (Managed Care) | Admitting: Podiatry

## 2024-08-08 ENCOUNTER — Ambulatory Visit (INDEPENDENT_AMBULATORY_CARE_PROVIDER_SITE_OTHER): Payer: Medicare (Managed Care) | Admitting: Podiatry

## 2024-08-08 DIAGNOSIS — Z91199 Patient's noncompliance with other medical treatment and regimen due to unspecified reason: Secondary | ICD-10-CM

## 2024-08-08 NOTE — Progress Notes (Signed)
 Appointment canceled patient not able to sit down due to dementia

## 2024-08-22 ENCOUNTER — Encounter: Payer: Self-pay | Admitting: Cardiovascular Disease

## 2024-08-22 ENCOUNTER — Ambulatory Visit: Payer: Medicare (Managed Care) | Attending: Cardiovascular Disease | Admitting: Cardiovascular Disease

## 2024-08-22 VITALS — BP 115/58 | HR 92 | Ht 63.0 in | Wt 138.0 lb

## 2024-08-22 DIAGNOSIS — E785 Hyperlipidemia, unspecified: Secondary | ICD-10-CM | POA: Diagnosis present

## 2024-08-22 DIAGNOSIS — Z951 Presence of aortocoronary bypass graft: Secondary | ICD-10-CM | POA: Insufficient documentation

## 2024-08-22 DIAGNOSIS — I4819 Other persistent atrial fibrillation: Secondary | ICD-10-CM | POA: Diagnosis not present

## 2024-08-22 DIAGNOSIS — I10 Essential (primary) hypertension: Secondary | ICD-10-CM | POA: Insufficient documentation

## 2024-08-22 NOTE — Assessment & Plan Note (Signed)
 History of CAD status post bypass grafting March 2000 by Dr. Kerrin with a LIMA to her LAD, vein to marginal branch of the circumflex and distal RCA.  She does have a history of anterior wall microinfarction in 1995 with percutaneous intervention of her RCA and LAD at that time.  Unfortunately she has progressive dementia and is unable to communicate any symptoms.  Her last Myoview  performed 03/06/2017 was nonischemic.

## 2024-08-22 NOTE — Assessment & Plan Note (Signed)
History of persistent A-fib rate controlled on Eliquis oral anticoagulation. 

## 2024-08-22 NOTE — Patient Instructions (Signed)

## 2024-08-22 NOTE — Assessment & Plan Note (Signed)
 History of essential hypertension blood pressure measured today at 115/58.  She is on amlodipine  and metoprolol .

## 2024-08-22 NOTE — Progress Notes (Signed)
 08/22/2024 Kristina Gibbs   Feb 12, 1946  [id number]  Primary Physician Cloria Annabella CROME, DO Primary Cardiologist: Dorn JINNY Lesches MD FACP, [place], [place], FSCAI  HPI:  Kristina Gibbs is a 78 y.o.  mildly overweight Caucasian female with a history of CAD previously taken care of by Dr. Maye. I last saw her in the office 11/09/2022.  She is accompanied by her husband Kristina Gibbs today.  Unfortunately, she has had progressive dementia.  She has a history of anterior wall myocardial infarction in 1995 with subsequent percutaneous revascularization to her RCA and LAD. She ultimately requiredmultivessel bypass surgery by Dr. Kerrin March of 2000 and LIMA to LAD, vein to a marginal branch of the circumflex and distal right coronary artery. Problems included hypertension and hyperlipidemia. I performed cardiac catheterization on her in 2010 revealing occluded vein grafts, patent LIMA and normal LV function.  Since I saw her in the office year ago she's remained medically stable with only a few episodes of nitrate responsive chest pain.   She was recently hospitalized overnight for chest pain 03/05/17. She ruled out for myocardial infarction. She was seen in consultation by Dr. Milbert  recommended a Myoview  stress test that was performed. This showed no ischemic ischemia. She's had no recurrent chest pain. Since I saw her in the office for months ago she's remained currently stable.  She was in A. fib with RVR when I saw her last and underwent DC cardioversion successfully to sinus rhythm.  She is on Eliquis  oral anticoagulation.   She was admitted to the hospital on 12/04/2020 with chest pain, A. fib with RVR.  She ruled out for myocardial infarction.  She was seen in consultation by Dr. Alvan who felt that her chest pain was referred related to demand ischemia and suggested altering her beta-blocker.  Her husband does say that it is difficult to keep up with her medications given her  progressive dementia.  Since I saw her in the office almost 2 years ago she is currently at [facility] nursing home.  Her husband Kristina Gibbs lives at their house.  She is minimally communicative.  She is unable to articulate any symptoms.   Current Meds  Medication Sig   amLODipine  (NORVASC ) 2.5 MG tablet Take 1 tablet (2.5 mg total) by mouth daily.   amoxicillin -clavulanate (AUGMENTIN ) 875-125 MG tablet Take 1 tablet by mouth every 12 (twelve) hours.   apixaban  (ELIQUIS ) 5 MG TABS tablet Take 1 tablet (5 mg total) by mouth 2 (two) times daily.   b complex vitamins tablet Take 1 tablet by mouth daily.    bacitracin  500 UNIT/GM ointment Apply 1 Application topically 2 (two) times daily.   cholecalciferol  (VITAMIN D ) 1000 UNITS tablet Take 1,000 Units by mouth daily.   CO ENZYME Q-10 PO Take 1 capsule by mouth 2 (two) times daily.   furosemide  (LASIX ) 20 MG tablet Take 20 mg by mouth as needed for fluid or edema.   hydrOXYzine  (VISTARIL ) 25 MG capsule Take 25 mg by mouth every 8 (eight) hours as needed for anxiety or itching.   lidocaine  (LIDODERM ) 5 % Place 1 patch onto the skin daily. Remove & Discard patch within 12 hours or as directed by MD   LORazepam  (ATIVAN ) 0.5 MG tablet Take 0.5 mg by mouth every 8 (eight) hours as needed for anxiety.   metoprolol  succinate (TOPROL -XL) 50 MG 24 hr tablet Take 1 tablet (50 mg total) by mouth in the morning and at bedtime.  mirtazapine (REMERON) 7.5 MG tablet Take 7.5 mg by mouth at bedtime.   morphine  (MSIR) 15 MG tablet Take 1 tablet (15 mg total) by mouth every 6 (six) hours as needed for severe pain (pain score 7-10).   nitroGLYCERIN  (NITROLINGUAL ) 0.4 MG/SPRAY spray Place 1 spray under the tongue every 5 (five) minutes x 3 doses as needed for chest pain.   pantoprazole  (PROTONIX ) 20 MG tablet Take 1 tablet (20 mg total) by mouth daily.   polyethylene glycol powder (GLYCOLAX /MIRALAX ) 17 GM/SCOOP powder Take 17 g by mouth daily as needed (constipation).    ranolazine  (RANEXA ) 500 MG 12 hr tablet Take 1 tablet (500 mg total) by mouth 2 (two) times daily.   rosuvastatin  (CRESTOR ) 5 MG tablet Take 5 mg by mouth daily.     Allergies  Allergen Reactions   Codeine Nausea And Vomiting   Hydrocortisone Itching   Lactose Intolerance (Gi) Nausea And Vomiting and Nausea Only    Social History   Socioeconomic History   Marital status: Married    Spouse name: Kristina Gibbs    Number of children: 3   Years of education: 12   Highest education level: High school graduate  Occupational History   Occupation: Retired  Tobacco Use   Smoking status: Former    Current packs/day: 0.00    Average packs/day: 0.5 packs/day for 49.0 years (24.5 ttl pk-yrs)    Types: Cigarettes    Start date: 10/05/1959    Quit date: 10/04/2008    Years since quitting: 15.8   Smokeless tobacco: Never  Vaping Use   Vaping status: Never Used  Substance and Sexual Activity   Alcohol use: Yes    Alcohol/week: 1.0 standard drink of alcohol    Types: 1 Glasses of wine per week    Comment: red wine once per month   Drug use: No   Sexual activity: Not Currently    Birth control/protection: Surgical  Other Topics Concern   Not on file  Social History Narrative   [place]/ [place]   Caffeine use: 3 cups coffee/day   Social Drivers of Corporate Investment Banker Strain: Low Risk  (03/12/2022)   Overall Financial Resource Strain (CARDIA)    Difficulty of Paying Living Expenses: Not hard at all  Food Insecurity: No Food Insecurity (03/12/2022)   Hunger Vital Sign    Worried About Running Out of Food in the Last Year: Never true    Ran Out of Food in the Last Year: Never true  Transportation Needs: No Transportation Needs (03/12/2022)   PRAPARE - Administrator, Civil Service (Medical): No    Lack of Transportation (Non-Medical): No  Physical Activity: Sufficiently Active (03/12/2022)   Exercise Vital Sign    Days of Exercise per Week: 7 days     Minutes of Exercise per Session: 30 min  Stress: No Stress Concern Present (03/12/2022)   [employer] of Occupational Health - Occupational Stress Questionnaire    Feeling of Stress : Not at all  Social Connections: Socially Integrated (03/12/2022)   Social Connection and Isolation Panel    Frequency of Communication with Friends and Family: More than three times a week    Frequency of Social Gatherings with Friends and Family: More than three times a week    Attends Religious Services: More than 4 times per year    Active Member of [employer] or Organizations: Yes    Attends Banker Meetings: More than 4  times per year    Marital Status: Married  Catering Manager Violence: Not At Risk (03/12/2022)   Humiliation, Afraid, Rape, and Kick questionnaire    Fear of Current or Ex-Partner: No    Emotionally Abused: No    Physically Abused: No    Sexually Abused: No     Review of Systems: General: negative for chills, fever, night sweats or weight changes.  Cardiovascular: negative for chest pain, dyspnea on exertion, edema, orthopnea, palpitations, paroxysmal nocturnal dyspnea or shortness of breath Dermatological: negative for rash Respiratory: negative for cough or wheezing Urologic: negative for hematuria Abdominal: negative for nausea, vomiting, diarrhea, bright red blood per rectum, melena, or hematemesis Neurologic: negative for visual changes, syncope, or dizziness All other systems reviewed and are otherwise negative except as noted above.    Blood pressure (!) 115/58, pulse 92, height 5' 3 (1.6 m), weight 138 lb (62.6 kg), SpO2 99%.  General appearance: alert and no distress Neck: no adenopathy, no carotid bruit, no JVD, supple, symmetrical, trachea midline, and thyroid  not enlarged, symmetric, no tenderness/mass/nodules Lungs: clear to auscultation bilaterally Heart: irregularly irregular rhythm Extremities: extremities normal, atraumatic, no cyanosis or  edema Pulses: 2+ and symmetric Skin: Skin color, texture, turgor normal. No rashes or lesions Neurologic: Grossly normal  EKG not performed today      ASSESSMENT AND PLAN:   Hx of CABG History of CAD status post bypass grafting March 2000 by Dr. Kerrin with a LIMA to her LAD, vein to marginal branch of the circumflex and distal RCA.  She does have a history of anterior wall microinfarction in 1995 with percutaneous intervention of her RCA and LAD at that time.  Unfortunately she has progressive dementia and is unable to communicate any symptoms.  Her last Myoview  performed 03/06/2017 was nonischemic.  Dyslipidemia, goal LDL below 70 History of dyslipidemia on low-dose Crestor  lipid profile performed 8//23 revealing total cholesterol 158, LDL 61 and HDL 79.  Essential hypertension History of essential hypertension blood pressure measured today at 115/58.  She is on amlodipine  and metoprolol .  Persistent atrial fibrillation History of persistent A-fib rate controlled on Eliquis  oral anticoagulation.     Dorn DOROTHA Lesches MD FACP,FACC,FAHA, [facility] 08/22/2024 12:08 PM

## 2024-08-22 NOTE — Assessment & Plan Note (Signed)
 History of dyslipidemia on low-dose Crestor  lipid profile performed 8//23 revealing total cholesterol 158, LDL 61 and HDL 79.

## 2024-09-14 ENCOUNTER — Ambulatory Visit: Admitting: Podiatry
